# Patient Record
Sex: Male | Born: 1940 | Race: White | Hispanic: No | Marital: Married | Smoking: Former smoker
Health system: Southern US, Community
[De-identification: ages and names within clinical notes are randomized; demographics above are authoritative.]

## PROBLEM LIST (undated history)

## (undated) DIAGNOSIS — F419 Anxiety disorder, unspecified: Secondary | ICD-10-CM

## (undated) DIAGNOSIS — I10 Essential (primary) hypertension: Secondary | ICD-10-CM

## (undated) DIAGNOSIS — F1910 Other psychoactive substance abuse, uncomplicated: Secondary | ICD-10-CM

## (undated) DIAGNOSIS — F0390 Unspecified dementia without behavioral disturbance: Secondary | ICD-10-CM

## (undated) DIAGNOSIS — C01 Malignant neoplasm of base of tongue: Secondary | ICD-10-CM

## (undated) DIAGNOSIS — Z973 Presence of spectacles and contact lenses: Secondary | ICD-10-CM

## (undated) DIAGNOSIS — G459 Transient cerebral ischemic attack, unspecified: Secondary | ICD-10-CM

## (undated) DIAGNOSIS — M1990 Unspecified osteoarthritis, unspecified site: Secondary | ICD-10-CM

## (undated) DIAGNOSIS — N200 Calculus of kidney: Secondary | ICD-10-CM

## (undated) DIAGNOSIS — Z923 Personal history of irradiation: Secondary | ICD-10-CM

## (undated) DIAGNOSIS — E871 Hypo-osmolality and hyponatremia: Secondary | ICD-10-CM

## (undated) DIAGNOSIS — F909 Attention-deficit hyperactivity disorder, unspecified type: Secondary | ICD-10-CM

## (undated) DIAGNOSIS — E785 Hyperlipidemia, unspecified: Secondary | ICD-10-CM

## (undated) DIAGNOSIS — K219 Gastro-esophageal reflux disease without esophagitis: Secondary | ICD-10-CM

## (undated) DIAGNOSIS — N486 Induration penis plastica: Secondary | ICD-10-CM

## (undated) DIAGNOSIS — E7800 Pure hypercholesterolemia, unspecified: Secondary | ICD-10-CM

## (undated) DIAGNOSIS — R300 Dysuria: Principal | ICD-10-CM

## (undated) HISTORY — DX: Pure hypercholesterolemia, unspecified: E78.00

## (undated) HISTORY — DX: Transient cerebral ischemic attack, unspecified: G45.9

## (undated) HISTORY — DX: Dysuria: R30.0

## (undated) HISTORY — DX: Malignant neoplasm of base of tongue: C01

## (undated) HISTORY — DX: Gastro-esophageal reflux disease without esophagitis: K21.9

## (undated) HISTORY — DX: Calculus of kidney: N20.0

## (undated) HISTORY — PX: COLONOSCOPY: SHX174

## (undated) HISTORY — DX: Hypo-osmolality and hyponatremia: E87.1

## (undated) HISTORY — DX: Essential (primary) hypertension: I10

## (undated) HISTORY — PX: SHOULDER ARTHROSCOPY W/ ROTATOR CUFF REPAIR: SHX2400

## (undated) HISTORY — DX: Induration penis plastica: N48.6

## (undated) HISTORY — PX: TONSILLECTOMY: SUR1361

## (undated) HISTORY — DX: Anxiety disorder, unspecified: F41.9

## (undated) HISTORY — PX: HAND SURGERY: SHX662

---

## 1999-10-11 ENCOUNTER — Ambulatory Visit (HOSPITAL_COMMUNITY): Admission: RE | Admit: 1999-10-11 | Discharge: 1999-10-11 | Payer: Self-pay | Admitting: Orthopedic Surgery

## 1999-10-11 ENCOUNTER — Encounter: Payer: Self-pay | Admitting: Orthopedic Surgery

## 2000-02-22 ENCOUNTER — Other Ambulatory Visit: Admission: RE | Admit: 2000-02-22 | Discharge: 2000-02-22 | Payer: Self-pay | Admitting: Orthopedic Surgery

## 2009-01-01 HISTORY — PX: CYSTOSCOPY W/ URETEROSCOPY W/ LITHOTRIPSY: SUR380

## 2009-08-15 ENCOUNTER — Emergency Department (HOSPITAL_COMMUNITY): Admission: EM | Admit: 2009-08-15 | Discharge: 2009-08-15 | Payer: Self-pay | Admitting: Emergency Medicine

## 2009-08-24 ENCOUNTER — Ambulatory Visit (HOSPITAL_COMMUNITY): Admission: RE | Admit: 2009-08-24 | Discharge: 2009-08-24 | Payer: Self-pay | Admitting: Urology

## 2010-01-01 HISTORY — PX: DUPUYTREN CONTRACTURE RELEASE: SHX1478

## 2010-03-17 LAB — URINALYSIS, ROUTINE W REFLEX MICROSCOPIC
Bilirubin Urine: NEGATIVE
Glucose, UA: NEGATIVE mg/dL
Ketones, ur: NEGATIVE mg/dL
Leukocytes, UA: NEGATIVE
Nitrite: NEGATIVE
Protein, ur: 30 mg/dL — AB
Specific Gravity, Urine: 1.031 — ABNORMAL HIGH (ref 1.005–1.030)
Urobilinogen, UA: 0.2 mg/dL (ref 0.0–1.0)
pH: 5.5 (ref 5.0–8.0)

## 2010-03-17 LAB — URINE MICROSCOPIC-ADD ON

## 2010-03-17 LAB — POCT I-STAT, CHEM 8
BUN: 24 mg/dL — ABNORMAL HIGH (ref 6–23)
Calcium, Ion: 1.17 mmol/L (ref 1.12–1.32)
Chloride: 107 mEq/L (ref 96–112)
Creatinine, Ser: 1.1 mg/dL (ref 0.4–1.5)
Glucose, Bld: 132 mg/dL — ABNORMAL HIGH (ref 70–99)
HCT: 51 % (ref 39.0–52.0)
Hemoglobin: 17.3 g/dL — ABNORMAL HIGH (ref 13.0–17.0)
Potassium: 4.4 mEq/L (ref 3.5–5.1)
Sodium: 141 mEq/L (ref 135–145)
TCO2: 26 mmol/L (ref 0–100)

## 2010-03-17 LAB — SURGICAL PCR SCREEN: MRSA, PCR: NEGATIVE

## 2010-08-29 ENCOUNTER — Ambulatory Visit (HOSPITAL_BASED_OUTPATIENT_CLINIC_OR_DEPARTMENT_OTHER)
Admission: RE | Admit: 2010-08-29 | Discharge: 2010-08-29 | Disposition: A | Discharge status: Home / Self Care | Payer: Medicare Other | Source: Ambulatory Visit | Attending: Orthopedic Surgery | Admitting: Orthopedic Surgery

## 2010-08-29 ENCOUNTER — Other Ambulatory Visit: Payer: Self-pay | Admitting: Orthopedic Surgery

## 2010-08-29 DIAGNOSIS — F909 Attention-deficit hyperactivity disorder, unspecified type: Secondary | ICD-10-CM | POA: Insufficient documentation

## 2010-08-29 DIAGNOSIS — M720 Palmar fascial fibromatosis [Dupuytren]: Secondary | ICD-10-CM | POA: Insufficient documentation

## 2010-08-29 DIAGNOSIS — F17200 Nicotine dependence, unspecified, uncomplicated: Secondary | ICD-10-CM | POA: Insufficient documentation

## 2010-08-29 DIAGNOSIS — Z01810 Encounter for preprocedural cardiovascular examination: Secondary | ICD-10-CM | POA: Insufficient documentation

## 2010-08-29 DIAGNOSIS — Z01812 Encounter for preprocedural laboratory examination: Secondary | ICD-10-CM | POA: Insufficient documentation

## 2010-08-30 LAB — POCT HEMOGLOBIN-HEMACUE: Hemoglobin: 17.3 g/dL — ABNORMAL HIGH (ref 13.0–17.0)

## 2010-09-01 NOTE — Op Note (Signed)
NAME:  Dylan Moreno, TRULL NO.:  [id number]  MEDICAL RECORD NO.:  [id number]  LOCATION:                                 FACILITY:  PHYSICIAN:  Katy Fitch. Shavelle Runkel, M.D. 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  (Procedures performed at [facility].  OPERATION: 1. Resection of complex Dupuytren palmar fibromatosis from left palm     and small finger. 2. Resection of Dupuytren palmar fibromatosis left long finger. 3. Resection of Dupuytren palmar fibromatosis left index finger and     webspace. 4. Resection of Dupuytren palmar fibromatosis left thumb.  OPERATING SURGEON:  Katy Fitch. Molli Gethers, MD  ASSISTANT:  Marveen Reeks. [name], PA-C.  Mr. [name]'s assistance  was invaluable throughout the procedure and essential for safe retraction of neurovascular structures due to the severely altered anatomy following prior needle aponeurotomy surgery.  ANESTHESIA:  General by LMA, supplemented by a left plexus block.  SUPERVISING ANESTHESIOLOGIST:  Luan Maberry. 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  We have been acquainted for more than 20 years.  More than 10 years prior, I performed a partial palmar fasciectomy of the right hand with a durable result.  Mr. 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  He had progressive disease in multiple fingers of the right and left hands. He was very much interested in avoiding traditional fasciectomy surgery. Therefore, he researched and found Dr. Bronson Ing at [facility].  He went to see Dr. Katherina Mires, for three separate needle aponeurotomy procedures on his hands.  Unfortunately, he had rapid recurrence of his flexion contractures.  Mr. Griesinger and I play golf at the same golf course and for time-to-time we crossed paths.  Beginning 3 years ago, he began to discuss possible open surgery as he was unsatisfied with the experience of needle aponeurotomy  During each encounter, I pointed out to him that after needle aponeurotomy, there is gross disruption of the architecture of the palmar space and subcutaneous tissues.  At times, there are neurovascular injuries with needle aponeurotomy that can place the fingers in jeopardy for subsequent  open surgery.  I saw him on 2 occasions in the past 6 months to discuss potential surgery on his left hand.  On each occasion, I pointed out to him that the chance of an injury to a blood vessel or nerve with needle aponeurotomy prior procedures is dramatically increased due to  scarring and loss of normal anatomic planes, I have had prior experience with needle aponeurotomy performed by the some of the most experienced surgeons in the [place] for this predicament with identification of profound anatomic alteration.  I told Mr. Claros that we could face a 50:50 chance of causing loss of circulation to a finger with open dissection.  He was frustrated the point with his severe flexion contractures and inability to hold a golf club and do to activities of daily living, therefore he decides to proceed this time with our best effort at resection of palmar fibromatosis from his left hand.  He understands that he will be faced with extensive postoperative rehabilitation.  He will make severe keloid scars.  He will have to night splint for period of time and he will have, over time, if he lives long enough, progressive Dupuytren disease that might even require skin grafting to manage.  Questions were invited and answered in detail in the office and once again in the holding area.  DESCRIPTION OF PROCEDURE:  Kyair Ditommaso was seen in the holding area of the [facility] where the left side proper surgical site was identified and initialed.  He was subsequently interviewed by Dr. Krista Blue, who recommended a plexus block.  This was placed without complications in the holding area leading to excellent anesthesia of the left upper extremity.  Mr. Sulkowski was brought to room one of the [facility] where under Dr. Robina Ade direct supervision, general anesthesia by LMA technique was induced.  Passive compression devices were applied to his calves and a tourniquet applied to proximal brachium.  Following routine Betadine scrub and paint and administration of 2 g of Ancef as an IV prophylactic antibiotic following protocol per weight, the left arm was exsanguinated, Esmarch bandage and arterial tourniquet inflated to 220 mmHg.  We planned complex  Brunner zigzag incisions.  There was a dense wad of scar in the palm at the site of prior needle aponeurotomies particularly in the pretendinous fibers of the ring and small fingers. This was an area of great jeopardy for possible injury to the common digital vessels and nerves with open surgery.  We subsequently elevated skin flaps at the dermal fascial junction and proceeded to meticulously dissect out the common digital vessels, proper digital vessels, and pathologic fascial cords to the small finger followed by the long finger, the thumb index web space, and extensive dissection on the palmar surface of the thumb, identifying the radial ulnar proper digital nerves, radial ulnar proper digital arteries, and correcting the extreme flexion contractures.  We found multiple instances of severe scarring of the post aponeurotomy wad of fibromatosis to the neurovascular structures, most notable at the long finger PIP joint, small finger PIP joint into the region of the base of the proximal phalanx where we typically would find discrete spiral bands.  There were significant retrovascular cords remaining present causing the 95-degree flexion contractures of the long and small finger PIP joints.  These were meticulously dissected.  We were faced with very extreme dissection of the deep surface of the dermis in the small and long fingers and may have issues with skin loss due  to the profound degree of scarring.  We ultimately performed the surgery under 2 discrete tourniquets, 1 hour and 35 minutes for the palm and small finger.  A 30-minute revascularization time followed by 1 hour and 4 minutes for the thumb index webspace and long finger.  This was an extremely complex surgery, very high risk and very arduous for all involved.  There were no apparent complications noted.  The tourniquet was released after the first dissection; and while capillary refill to the small finger was slow, we  ultimately obtained normal turgor and normal apparent pulse volume.  Following dissection of the long finger and thumb, we had immediate capillary refill, normal turgor and excellent capillary refill at 2 seconds.  The wounds were inspected for bleeding points which were electrocauterized with bipolar forceps followed by repair of the skin with corner sutures of 5-0 nylon and loose interrupted sutures of 5-0 nylon.  Loosely closed with what turned out to the partial open palm technique.  The scarring in the palm to the small finger was so dense and the risk to the common digital vessels in my judgment so great, I ultimately elected to leave a significant fragment of fibromatosis intact so as not to cause injury to the common digital vessels to the ring and small fingers.  The wounds were dressed with Adaptic, Silvadene, sterile gauze, sterile Webril, and a volar plaster splint maintaining the PIP joints of the ring and small fingers at approximately 30 degrees flexion and the long finger 20 degrees of flexion.  While we were able to fully extend the fingers in the OR, I did not want to splint him in full extension out of worry that this could cause vessel spasm and possible postoperative ischemia.  Mr. Simson was transferred to recovery room in stable signs.  A postoperative conference will be held with his family.  Questions were invited and answered in detail.     Katy Fitch Shaunie Boehm, M.D.     RVS/MEDQ  D:  08/29/2010  T:  08/29/2010  Job:  [id number]  Electronically Signed by Josephine Igo M.D. on 09/01/2010 09:17:13 AM

## 2010-10-27 ENCOUNTER — Other Ambulatory Visit: Payer: Self-pay | Admitting: Dermatology

## 2011-01-17 DIAGNOSIS — E782 Mixed hyperlipidemia: Secondary | ICD-10-CM | POA: Diagnosis not present

## 2011-01-17 DIAGNOSIS — Z0000 Encounter for general adult medical examination without abnormal findings: Secondary | ICD-10-CM | POA: Diagnosis not present

## 2011-01-17 DIAGNOSIS — F909 Attention-deficit hyperactivity disorder, unspecified type: Secondary | ICD-10-CM | POA: Diagnosis not present

## 2011-01-17 DIAGNOSIS — F17200 Nicotine dependence, unspecified, uncomplicated: Secondary | ICD-10-CM | POA: Diagnosis not present

## 2011-06-25 DIAGNOSIS — M25559 Pain in unspecified hip: Secondary | ICD-10-CM | POA: Diagnosis not present

## 2011-07-18 DIAGNOSIS — Z125 Encounter for screening for malignant neoplasm of prostate: Secondary | ICD-10-CM | POA: Diagnosis not present

## 2011-07-18 DIAGNOSIS — R030 Elevated blood-pressure reading, without diagnosis of hypertension: Secondary | ICD-10-CM | POA: Diagnosis not present

## 2011-07-18 DIAGNOSIS — E782 Mixed hyperlipidemia: Secondary | ICD-10-CM | POA: Diagnosis not present

## 2011-07-18 DIAGNOSIS — F17200 Nicotine dependence, unspecified, uncomplicated: Secondary | ICD-10-CM | POA: Diagnosis not present

## 2011-07-18 DIAGNOSIS — F909 Attention-deficit hyperactivity disorder, unspecified type: Secondary | ICD-10-CM | POA: Diagnosis not present

## 2011-08-31 DIAGNOSIS — Z23 Encounter for immunization: Secondary | ICD-10-CM | POA: Diagnosis not present

## 2011-10-23 DIAGNOSIS — Z85828 Personal history of other malignant neoplasm of skin: Secondary | ICD-10-CM | POA: Diagnosis not present

## 2011-10-23 DIAGNOSIS — L723 Sebaceous cyst: Secondary | ICD-10-CM | POA: Diagnosis not present

## 2011-10-23 DIAGNOSIS — L570 Actinic keratosis: Secondary | ICD-10-CM | POA: Diagnosis not present

## 2011-10-29 DIAGNOSIS — M720 Palmar fascial fibromatosis [Dupuytren]: Secondary | ICD-10-CM | POA: Diagnosis not present

## 2011-11-06 DIAGNOSIS — M720 Palmar fascial fibromatosis [Dupuytren]: Secondary | ICD-10-CM | POA: Diagnosis not present

## 2011-11-20 ENCOUNTER — Encounter (HOSPITAL_BASED_OUTPATIENT_CLINIC_OR_DEPARTMENT_OTHER): Payer: Self-pay

## 2011-11-20 ENCOUNTER — Ambulatory Visit (HOSPITAL_BASED_OUTPATIENT_CLINIC_OR_DEPARTMENT_OTHER): Admit: 2011-11-20 | Payer: Self-pay | Admitting: Orthopedic Surgery

## 2011-11-20 ENCOUNTER — Encounter: Payer: Self-pay | Admitting: Oncology

## 2011-11-20 SURGERY — RELEASE, DUPUYTREN CONTRACTURE
Anesthesia: General | Laterality: Right

## 2011-11-20 NOTE — Progress Notes (Signed)
Faxed cancer form to [employer] @ [phone]. °

## 2012-01-21 ENCOUNTER — Other Ambulatory Visit: Payer: Self-pay | Admitting: Dermatology

## 2012-01-21 DIAGNOSIS — D485 Neoplasm of uncertain behavior of skin: Secondary | ICD-10-CM | POA: Diagnosis not present

## 2012-01-21 DIAGNOSIS — L820 Inflamed seborrheic keratosis: Secondary | ICD-10-CM | POA: Diagnosis not present

## 2012-01-21 DIAGNOSIS — D0460 Carcinoma in situ of skin of unspecified upper limb, including shoulder: Secondary | ICD-10-CM | POA: Diagnosis not present

## 2012-01-23 DIAGNOSIS — E782 Mixed hyperlipidemia: Secondary | ICD-10-CM | POA: Diagnosis not present

## 2012-01-23 DIAGNOSIS — F909 Attention-deficit hyperactivity disorder, unspecified type: Secondary | ICD-10-CM | POA: Diagnosis not present

## 2012-01-23 DIAGNOSIS — F17200 Nicotine dependence, unspecified, uncomplicated: Secondary | ICD-10-CM | POA: Diagnosis not present

## 2012-01-23 DIAGNOSIS — R030 Elevated blood-pressure reading, without diagnosis of hypertension: Secondary | ICD-10-CM | POA: Diagnosis not present

## 2012-05-14 DIAGNOSIS — H4010X0 Unspecified open-angle glaucoma, stage unspecified: Secondary | ICD-10-CM | POA: Diagnosis not present

## 2012-05-14 DIAGNOSIS — H04129 Dry eye syndrome of unspecified lacrimal gland: Secondary | ICD-10-CM | POA: Diagnosis not present

## 2012-05-14 DIAGNOSIS — H35039 Hypertensive retinopathy, unspecified eye: Secondary | ICD-10-CM | POA: Diagnosis not present

## 2012-05-14 DIAGNOSIS — H43399 Other vitreous opacities, unspecified eye: Secondary | ICD-10-CM | POA: Diagnosis not present

## 2012-07-23 DIAGNOSIS — E782 Mixed hyperlipidemia: Secondary | ICD-10-CM | POA: Diagnosis not present

## 2012-07-23 DIAGNOSIS — F909 Attention-deficit hyperactivity disorder, unspecified type: Secondary | ICD-10-CM | POA: Diagnosis not present

## 2012-07-23 DIAGNOSIS — F17200 Nicotine dependence, unspecified, uncomplicated: Secondary | ICD-10-CM | POA: Diagnosis not present

## 2012-07-23 DIAGNOSIS — Z125 Encounter for screening for malignant neoplasm of prostate: Secondary | ICD-10-CM | POA: Diagnosis not present

## 2012-08-01 ENCOUNTER — Other Ambulatory Visit: Payer: Self-pay | Admitting: Dermatology

## 2012-08-01 DIAGNOSIS — D1801 Hemangioma of skin and subcutaneous tissue: Secondary | ICD-10-CM | POA: Diagnosis not present

## 2012-08-01 DIAGNOSIS — Q828 Other specified congenital malformations of skin: Secondary | ICD-10-CM | POA: Diagnosis not present

## 2012-08-01 DIAGNOSIS — D0460 Carcinoma in situ of skin of unspecified upper limb, including shoulder: Secondary | ICD-10-CM | POA: Diagnosis not present

## 2012-08-01 DIAGNOSIS — D485 Neoplasm of uncertain behavior of skin: Secondary | ICD-10-CM | POA: Diagnosis not present

## 2012-08-01 DIAGNOSIS — L821 Other seborrheic keratosis: Secondary | ICD-10-CM | POA: Diagnosis not present

## 2012-08-01 DIAGNOSIS — L570 Actinic keratosis: Secondary | ICD-10-CM | POA: Diagnosis not present

## 2012-08-01 DIAGNOSIS — Z85828 Personal history of other malignant neoplasm of skin: Secondary | ICD-10-CM | POA: Diagnosis not present

## 2012-09-20 DIAGNOSIS — Z23 Encounter for immunization: Secondary | ICD-10-CM | POA: Diagnosis not present

## 2012-10-29 ENCOUNTER — Encounter (HOSPITAL_BASED_OUTPATIENT_CLINIC_OR_DEPARTMENT_OTHER): Payer: Self-pay | Admitting: *Deleted

## 2012-10-29 ENCOUNTER — Other Ambulatory Visit: Payer: Self-pay | Admitting: Orthopedic Surgery

## 2012-10-29 DIAGNOSIS — M720 Palmar fascial fibromatosis [Dupuytren]: Secondary | ICD-10-CM | POA: Diagnosis not present

## 2012-10-29 NOTE — Progress Notes (Signed)
He had other hand done here 2012-did well-no labs needed

## 2012-10-29 NOTE — H&P (Signed)
  Dylan Moreno is an 72 y.o. male.   Chief Complaint: c/o chronic and progressive contracture of the right hand HPI: Dylan Moreno is a well known patient who I have taken care of for 2 decades regarding issues with Dupuytren's palmar fibromatosis. He is currently followed by Dylan Moreno for primary care. He is an Dylan Moreno. 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 Dylan Moreno has Dupuytren's diathesis. He has had multiple procedures on both hands. In 2002 I resected palmar fibromatosis from his right thumb index web space, palm, ring and small fingers. He now has significant recurrent disease in his ring and small fingers crossing the PIP joints.   Past Medical History  Diagnosis Date  . Hyperlipemia   . Arthritis   . ADHD (attention deficit hyperactivity disorder)   . Wears glasses     Past Surgical History  Procedure Laterality Date  . Dupuytren contracture release  2012    left hand  . Tonsillectomy    . Shoulder arthroscopy w/ rotator cuff repair      left  . Colonoscopy    . Cystoscopy w/ ureteroscopy w/ lithotripsy  2011  . Hand surgery      right    History reviewed. No pertinent family history. Social History:  reports that he quit smoking about 3 years ago. His smoking use included Cigarettes. He smoked 0.00 packs per day. He does not have any smokeless tobacco history on file. He reports that he drinks alcohol. He reports that he does not use illicit drugs.  Allergies: No Known Allergies  No prescriptions prior to admission    No results found for this or any previous visit (from the past 48 hour(s)).  No results found.   Pertinent items are noted in HPI.  Height 6\' 2"  (1.88 m), weight 86.183 kg (190 lb).  General appearance: alert Head: Normocephalic, without obvious abnormality Neck: supple, symmetrical, trachea midline Resp: clear to auscultation bilaterally Cardio: regular rate and rhythm GI: normal findings: bowel  sounds normal Extremities:. He now has more than 90 degree flexion contracture of the PIP joints. There is extensive lateral sheath disease and retrovascular cord disease in the ring and small fingers. Pulses: 2+ and symmetric Skin: normal Neurologic: Grossly normal    Assessment/Plan Impression: Extensive recurrent Dupuytren's contracture of the right hand  Plan: To the OR for excision Dupuytren's contracture right hand involving the palm' ring and small fingers.The procedure, risks,benefits and post-op course were discussed with the patient at length and they were in agreement with the plan.  Dylan Moreno,Dylan Moreno 10/29/2012, 4:30 PM   H&P documentation: 10/30/2012  -History and Physical Reviewed  -Patient has been re-examined  -No change in the plan of care  Dylan Forster, MD

## 2012-10-30 ENCOUNTER — Encounter (HOSPITAL_BASED_OUTPATIENT_CLINIC_OR_DEPARTMENT_OTHER)
Admission: RE | Disposition: A | Discharge status: Home / Self Care | Payer: Self-pay | Source: Ambulatory Visit | Attending: Orthopedic Surgery

## 2012-10-30 ENCOUNTER — Ambulatory Visit (HOSPITAL_BASED_OUTPATIENT_CLINIC_OR_DEPARTMENT_OTHER): Payer: Medicare Other | Admitting: Anesthesiology

## 2012-10-30 ENCOUNTER — Ambulatory Visit (HOSPITAL_BASED_OUTPATIENT_CLINIC_OR_DEPARTMENT_OTHER)
Admission: RE | Admit: 2012-10-30 | Discharge: 2012-10-30 | Disposition: A | Discharge status: Home / Self Care | Payer: Medicare Other | Source: Ambulatory Visit | Attending: Orthopedic Surgery | Admitting: Orthopedic Surgery

## 2012-10-30 ENCOUNTER — Encounter (HOSPITAL_BASED_OUTPATIENT_CLINIC_OR_DEPARTMENT_OTHER): Payer: Self-pay | Admitting: Anesthesiology

## 2012-10-30 ENCOUNTER — Encounter (HOSPITAL_BASED_OUTPATIENT_CLINIC_OR_DEPARTMENT_OTHER): Payer: Medicare Other | Admitting: Anesthesiology

## 2012-10-30 DIAGNOSIS — F909 Attention-deficit hyperactivity disorder, unspecified type: Secondary | ICD-10-CM | POA: Insufficient documentation

## 2012-10-30 DIAGNOSIS — M720 Palmar fascial fibromatosis [Dupuytren]: Secondary | ICD-10-CM | POA: Diagnosis not present

## 2012-10-30 DIAGNOSIS — M129 Arthropathy, unspecified: Secondary | ICD-10-CM | POA: Insufficient documentation

## 2012-10-30 DIAGNOSIS — E785 Hyperlipidemia, unspecified: Secondary | ICD-10-CM | POA: Insufficient documentation

## 2012-10-30 DIAGNOSIS — Z87891 Personal history of nicotine dependence: Secondary | ICD-10-CM | POA: Insufficient documentation

## 2012-10-30 HISTORY — DX: Hyperlipidemia, unspecified: E78.5

## 2012-10-30 HISTORY — PX: DUPUYTREN CONTRACTURE RELEASE: SHX1478

## 2012-10-30 HISTORY — DX: Unspecified osteoarthritis, unspecified site: M19.90

## 2012-10-30 HISTORY — DX: Presence of spectacles and contact lenses: Z97.3

## 2012-10-30 HISTORY — DX: Attention-deficit hyperactivity disorder, unspecified type: F90.9

## 2012-10-30 SURGERY — RELEASE, DUPUYTREN CONTRACTURE
Anesthesia: General | Site: Hand | Laterality: Right | Wound class: Clean

## 2012-10-30 MED ORDER — FENTANYL CITRATE 0.05 MG/ML IJ SOLN: 100.0000 ug | Freq: Once | INTRAMUSCULAR | Status: DC

## 2012-10-30 MED ORDER — PROMETHAZINE HCL 25 MG/ML IJ SOLN: 6.2500 mg | INTRAMUSCULAR | Status: DC | PRN

## 2012-10-30 MED ORDER — CHLORHEXIDINE GLUCONATE 4 % EX LIQD: 60.0000 mL | Freq: Once | CUTANEOUS | Status: DC

## 2012-10-30 MED ORDER — METHYLPREDNISOLONE ACETATE 40 MG/ML IJ SUSP
INTRAMUSCULAR | Status: AC
  Filled 2012-10-30: qty 1

## 2012-10-30 MED ORDER — CEPHALEXIN 500 MG PO CAPS: 500.0000 mg | ORAL_CAPSULE | Freq: Three times a day (TID) | ORAL | Status: DC

## 2012-10-30 MED ORDER — LIDOCAINE HCL (CARDIAC) 20 MG/ML IV SOLN
INTRAVENOUS | Status: DC | PRN
  Administered 2012-10-30: 50 mg via INTRAVENOUS

## 2012-10-30 MED ORDER — OXYCODONE HCL 5 MG PO TABS: 5.0000 mg | ORAL_TABLET | Freq: Once | ORAL | Status: DC | PRN

## 2012-10-30 MED ORDER — LIDOCAINE HCL 2 % IJ SOLN
INTRAMUSCULAR | Status: AC
  Filled 2012-10-30: qty 20

## 2012-10-30 MED ORDER — SILVER SULFADIAZINE 1 % EX CREA
TOPICAL_CREAM | CUTANEOUS | Status: AC
  Filled 2012-10-30: qty 85

## 2012-10-30 MED ORDER — FENTANYL CITRATE 0.05 MG/ML IJ SOLN
50.0000 ug | INTRAMUSCULAR | Status: DC | PRN
  Administered 2012-10-30: 100 ug via INTRAVENOUS

## 2012-10-30 MED ORDER — ROPIVACAINE HCL 5 MG/ML IJ SOLN
INTRAMUSCULAR | Status: DC | PRN
  Administered 2012-10-30: 30 mL

## 2012-10-30 MED ORDER — MIDAZOLAM HCL 2 MG/2ML IJ SOLN
1.0000 mg | INTRAMUSCULAR | Status: DC | PRN
  Administered 2012-10-30: 2 mg via INTRAVENOUS

## 2012-10-30 MED ORDER — OXYCODONE HCL 5 MG/5ML PO SOLN: 5.0000 mg | Freq: Once | ORAL | Status: DC | PRN

## 2012-10-30 MED ORDER — PROPOFOL INFUSION 10 MG/ML OPTIME
INTRAVENOUS | Status: DC | PRN
  Administered 2012-10-30: 100 ug/kg/min via INTRAVENOUS

## 2012-10-30 MED ORDER — HYDROMORPHONE HCL 2 MG PO TABS: ORAL_TABLET | ORAL | Status: DC

## 2012-10-30 MED ORDER — MIDAZOLAM HCL 2 MG/2ML IJ SOLN
INTRAMUSCULAR | Status: AC
  Filled 2012-10-30: qty 2

## 2012-10-30 MED ORDER — LIDOCAINE HCL (PF) 1 % IJ SOLN
INTRAMUSCULAR | Status: AC
  Filled 2012-10-30: qty 30

## 2012-10-30 MED ORDER — CEFAZOLIN SODIUM-DEXTROSE 2-3 GM-% IV SOLR
INTRAVENOUS | Status: DC | PRN
  Administered 2012-10-30: 2 g via INTRAVENOUS

## 2012-10-30 MED ORDER — SILVER SULFADIAZINE 1 % EX CREA
TOPICAL_CREAM | CUTANEOUS | Status: DC | PRN
  Administered 2012-10-30: 1 via TOPICAL

## 2012-10-30 MED ORDER — FENTANYL CITRATE 0.05 MG/ML IJ SOLN
INTRAMUSCULAR | Status: AC
  Filled 2012-10-30: qty 2

## 2012-10-30 MED ORDER — LIDOCAINE HCL (PF) 1 % IJ SOLN
INTRAMUSCULAR | Status: AC
  Filled 2012-10-30: qty 5

## 2012-10-30 MED ORDER — MORPHINE SULFATE 2 MG/ML IJ SOLN: 1.0000 mg | INTRAMUSCULAR | Status: DC | PRN

## 2012-10-30 MED ORDER — MIDAZOLAM HCL 2 MG/2ML IJ SOLN: 1.0000 mg | INTRAMUSCULAR | Status: DC | PRN

## 2012-10-30 MED ORDER — DEXAMETHASONE SODIUM PHOSPHATE 4 MG/ML IJ SOLN
INTRAMUSCULAR | Status: DC | PRN
  Administered 2012-10-30: 10 mg via INTRAVENOUS

## 2012-10-30 MED ORDER — PROPOFOL 10 MG/ML IV BOLUS
INTRAVENOUS | Status: DC | PRN
  Administered 2012-10-30: 230 mg via INTRAVENOUS

## 2012-10-30 MED ORDER — LACTATED RINGERS IV SOLN
INTRAVENOUS | Status: DC
  Administered 2012-10-30 (×2): via INTRAVENOUS

## 2012-10-30 SURGICAL SUPPLY — 56 items
BANDAGE ADHESIVE 1X3 (GAUZE/BANDAGES/DRESSINGS) IMPLANT
BANDAGE COBAN STERILE 2 (GAUZE/BANDAGES/DRESSINGS) ×1 IMPLANT
BANDAGE CONFORM 3  STR LF (GAUZE/BANDAGES/DRESSINGS) IMPLANT
BANDAGE ELASTIC 3 VELCRO ST LF (GAUZE/BANDAGES/DRESSINGS) ×2 IMPLANT
BANDAGE GAUZE ELAST BULKY 4 IN (GAUZE/BANDAGES/DRESSINGS) ×4 IMPLANT
BLADE MINI RND TIP GREEN BEAV (BLADE) ×2 IMPLANT
BLADE SURG 15 STRL LF DISP TIS (BLADE) ×1 IMPLANT
BLADE SURG 15 STRL SS (BLADE) ×6
BNDG CMPR 9X4 STRL LF SNTH (GAUZE/BANDAGES/DRESSINGS) ×1
BNDG COHESIVE 3X5 TAN STRL LF (GAUZE/BANDAGES/DRESSINGS) ×2 IMPLANT
BNDG ESMARK 4X9 LF (GAUZE/BANDAGES/DRESSINGS) ×1 IMPLANT
BRUSH SCRUB EZ PLAIN DRY (MISCELLANEOUS) ×2 IMPLANT
CORDS BIPOLAR (ELECTRODE) ×2 IMPLANT
COVER MAYO STAND STRL (DRAPES) ×2 IMPLANT
COVER TABLE BACK 60X90 (DRAPES) ×2 IMPLANT
CUFF TOURNIQUET SINGLE 18IN (TOURNIQUET CUFF) ×1 IMPLANT
DECANTER SPIKE VIAL GLASS SM (MISCELLANEOUS) IMPLANT
DRAPE EXTREMITY T 121X128X90 (DRAPE) ×2 IMPLANT
DRAPE SURG 17X23 STRL (DRAPES) ×2 IMPLANT
DRSG EMULSION OIL 3X3 NADH (GAUZE/BANDAGES/DRESSINGS) ×4 IMPLANT
GAUZE SPONGE 4X4 12PLY STRL LF (GAUZE/BANDAGES/DRESSINGS) ×1 IMPLANT
GLOVE BIOGEL M 7.0 STRL (GLOVE) ×1 IMPLANT
GLOVE BIOGEL M STRL SZ7.5 (GLOVE) ×2 IMPLANT
GLOVE BIOGEL PI IND STRL 7.0 (GLOVE) IMPLANT
GLOVE BIOGEL PI IND STRL 7.5 (GLOVE) IMPLANT
GLOVE BIOGEL PI INDICATOR 7.0 (GLOVE) ×2
GLOVE BIOGEL PI INDICATOR 7.5 (GLOVE) ×1
GLOVE ECLIPSE 6.5 STRL STRAW (GLOVE) ×6 IMPLANT
GLOVE ORTHO TXT STRL SZ7.5 (GLOVE) ×2 IMPLANT
GOWN BRE IMP PREV XXLGXLNG (GOWN DISPOSABLE) ×4 IMPLANT
GOWN PREVENTION PLUS XLARGE (GOWN DISPOSABLE) ×6 IMPLANT
LOOP VESSEL MAXI BLUE (MISCELLANEOUS) ×2 IMPLANT
NDL HYPO 25X1 1.5 SAFETY (NEEDLE) IMPLANT
NEEDLE 27GAX1X1/2 (NEEDLE) IMPLANT
NEEDLE HYPO 25X1 1.5 SAFETY (NEEDLE) IMPLANT
NS IRRIG 1000ML POUR BTL (IV SOLUTION) ×2 IMPLANT
PACK BASIN DAY SURGERY FS (CUSTOM PROCEDURE TRAY) ×2 IMPLANT
PAD CAST 3X4 CTTN HI CHSV (CAST SUPPLIES) ×1 IMPLANT
PADDING CAST ABS 4INX4YD NS (CAST SUPPLIES) ×1
PADDING CAST ABS COTTON 4X4 ST (CAST SUPPLIES) ×1 IMPLANT
PADDING CAST COTTON 3X4 STRL (CAST SUPPLIES) ×4
SPLINT PLASTER CAST XFAST 3X15 (CAST SUPPLIES) ×4 IMPLANT
SPLINT PLASTER XTRA FASTSET 3X (CAST SUPPLIES) ×10
SPONGE GAUZE 4X4 12PLY (GAUZE/BANDAGES/DRESSINGS) ×2 IMPLANT
STOCKINETTE 4X48 STRL (DRAPES) ×2 IMPLANT
STRIP CLOSURE SKIN 1/2X4 (GAUZE/BANDAGES/DRESSINGS) IMPLANT
SUT ETHILON 5 0 P 3 18 (SUTURE) ×2
SUT NYLON ETHILON 5-0 P-3 1X18 (SUTURE) ×2 IMPLANT
SUT SILK 4 0 PS 2 (SUTURE) ×3 IMPLANT
SUT VIC AB 4-0 P2 18 (SUTURE) IMPLANT
SYR 3ML 23GX1 SAFETY (SYRINGE) IMPLANT
SYR BULB 3OZ (MISCELLANEOUS) ×1 IMPLANT
SYR CONTROL 10ML LL (SYRINGE) IMPLANT
TOWEL OR 17X24 6PK STRL BLUE (TOWEL DISPOSABLE) ×3 IMPLANT
TRAY DSU PREP LF (CUSTOM PROCEDURE TRAY) ×2 IMPLANT
UNDERPAD 30X30 INCONTINENT (UNDERPADS AND DIAPERS) ×2 IMPLANT

## 2012-10-30 NOTE — Progress Notes (Signed)
Assisted Dr. Massagee with right, ultrasound guided, supraclavicular block. Side rails up, monitors on throughout procedure. See vital signs in flow sheet. Tolerated Procedure well. 

## 2012-10-30 NOTE — Op Note (Signed)
[id number] 

## 2012-10-30 NOTE — Brief Op Note (Signed)
10/30/2012  1:23 PM  PATIENT:  Dylan Moreno  72 y.o. male  PRE-OPERATIVE DIAGNOSIS:  RIGHT DUPUYTREN'S DIATHESIS  POST-OPERATIVE DIAGNOSIS:  RIGHT DUPUYTREN'S DIATHESIS  PROCEDURE:  Procedure(s): DUPUYTREN CONTRACTURE RELEASE RIGHT PALM,RING AND SMALL FINGER (Right)  SURGEON:  Surgeon(s) and Role:    * [name]., MD - Primary  PHYSICIAN ASSISTANT:   ASSISTANTS: Mallory Shirk.A-C   ANESTHESIA:   general  EBL:  Total I/O In: 1000 [I.V.:1000] Out: -   BLOOD ADMINISTERED:none  DRAINS: none   LOCAL MEDICATIONS USED:  ROPIVACAINE PLEXUS BLOCK  SPECIMEN:  Biopsy / Limited Resection  DISPOSITION OF SPECIMEN:  PATHOLOGY  COUNTS:  YES  TOURNIQUET:   Total Tourniquet Time Documented: Upper Arm (Right) - 98 minutes Upper Arm (Right) - 12 minutes Total: Upper Arm (Right) - 110 minutes   DICTATION: .Other Dictation: Dictation Number [id number]  PLAN OF CARE: Discharge to home after PACU  PATIENT DISPOSITION:  PACU - hemodynamically stable.   Delay start of Pharmacological VTE agent (>24hrs) due to surgical blood loss or risk of bleeding: not applicable

## 2012-10-30 NOTE — Anesthesia Procedure Notes (Addendum)
Anesthesia Regional Block:  Supraclavicular block  Pre-Anesthetic Checklist: ,, timeout performed, Correct Patient, Correct Site, Correct Laterality, Correct Procedure, Correct Position, site marked, Risks and benefits discussed,  Surgical consent,  Pre-op evaluation,  At surgeon's request and post-op pain management  Laterality: Right and Upper  Prep: chloraprep       Needles:   Needle Type: Echogenic Needle      Needle Gauge: 22 and 22 G  Needle insertion depth: 5 cm   Additional Needles:  Procedures: ultrasound guided (picture in chart) Supraclavicular block Narrative:  Start time: 10/30/2012 10:30 AM End time: 10/30/2012 10:45 AM Injection made incrementally with aspirations every 5 mL.  Performed by: Personally  Anesthesiologist: T Massagee  Additional Notes: Monitors applied. Sedation. US guidance. Tolerated well   Procedure Name: LMA Insertion Date/Time: 10/30/2012 11:17 AM Performed by: Gar Gibbon Pre-anesthesia Checklist: Patient identified, Emergency Drugs available, Suction available and Patient being monitored Patient Re-evaluated:Patient Re-evaluated prior to inductionOxygen Delivery Method: Circle System Utilized Preoxygenation: Pre-oxygenation with 100% oxygen Intubation Type: IV induction Ventilation: Mask ventilation without difficulty LMA: LMA inserted LMA Size: 4.0 Number of attempts: 1 Airway Equipment and Method: bite block Placement Confirmation: positive ETCO2 Tube secured with: Tape Dental Injury: Teeth and Oropharynx as per pre-operative assessment

## 2012-10-30 NOTE — Anesthesia Preprocedure Evaluation (Addendum)
Anesthesia Evaluation  Patient identified by MRN, date of birth, ID band Patient awake    Reviewed: Allergy & Precautions, H&P , NPO status , Patient's Chart, lab work & pertinent test results  Airway Mallampati: I  Neck ROM: Full    Dental   Pulmonary neg pulmonary ROS, Current Smoker,  breath sounds clear to auscultation        Cardiovascular negative cardio ROS  Rhythm:Regular Rate:Normal     Neuro/Psych ADHD    GI/Hepatic negative GI ROS, Neg liver ROS,   Endo/Other    Renal/GU negative Renal ROS     Musculoskeletal   Abdominal   Peds  Hematology negative hematology ROS (+)   Anesthesia Other Findings   Reproductive/Obstetrics                          Anesthesia Physical Anesthesia Plan  ASA: II  Anesthesia Plan: General   Post-op Pain Management: MAC Combined w/ Regional for Post-op pain   Induction: Intravenous  Airway Management Planned: LMA  Additional Equipment:   Intra-op Plan:   Post-operative Plan: Extubation in OR  Informed Consent:   Dental advisory given  Plan Discussed with:   Anesthesia Plan Comments:        Anesthesia Quick Evaluation

## 2012-10-30 NOTE — Anesthesia Postprocedure Evaluation (Signed)
  Anesthesia Post-op Note  Patient: Dylan Moreno  Procedure(s) Performed: Procedure(s): DUPUYTREN CONTRACTURE RELEASE RIGHT PALM,RING AND SMALL FINGER (Right)  Patient Location: PACU  Anesthesia Type:General and block  Level of Consciousness: awake and alert   Airway and Oxygen Therapy: Patient Spontanous Breathing  Post-op Pain: none  Post-op Assessment: Post-op Vital signs reviewed, Patient's Cardiovascular Status Stable and Respiratory Function Stable  Post-op Vital Signs: Reviewed  Filed Vitals:   10/30/12 1415  BP: 122/74  Pulse: 72  Temp:   Resp: 15    Complications: No apparent anesthesia complications

## 2012-10-30 NOTE — Transfer of Care (Signed)
Immediate Anesthesia Transfer of Care Note  Patient: Dylan Moreno  Procedure(s) Performed: Procedure(s): DUPUYTREN CONTRACTURE RELEASE RIGHT PALM,RING AND SMALL FINGER (Right)  Patient Location: PACU  Anesthesia Type:GA combined with regional for post-op pain  Level of Consciousness: sedated and patient cooperative  Airway & Oxygen Therapy: Patient Spontanous Breathing and Patient connected to face mask oxygen  Post-op Assessment: Report given to PACU RN and Post -op Vital signs reviewed and stable  Post vital signs: Reviewed and stable  Complications: No apparent anesthesia complications

## 2012-10-31 ENCOUNTER — Encounter (HOSPITAL_BASED_OUTPATIENT_CLINIC_OR_DEPARTMENT_OTHER): Payer: Self-pay | Admitting: Orthopedic Surgery

## 2012-10-31 NOTE — Op Note (Signed)
NAME:  Dylan, Moreno NO.:  [id number]  MEDICAL RECORD NO.:  [id number]  LOCATION:                               FACILITY:  [facility]  PHYSICIAN:  Katy Fitch. Elene Downum, M.D. 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  OPERATION: 1. Resection of extraordinarily complex Dupuytren's fibromatosis from     palm and right small finger with severe scarring of neurovascular     bundles due to prior needle aponeurotomy. 2. Resection of Dupuytren's palmar fibromatosis from right ring     finger.  OPERATING SURGEON:  Katy Fitch. Effa Yarrow, M.D.  ASSISTANT:  Marveen Reeks [name], PA-C  ANESTHESIA:  General by LMA supplemented by a ropivacaine right plexus block.  SUPERVISING  ANESTHESIOLOGIST:  Burna Forts, M.D.  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  Dylan Moreno and I have been acquainted for approximately 20 years.  I routinely see him as we play golf at the same facility.  He has severe Dupuytren's diathesis with [place] ancestors and a strong family history with his brother and son being affected.  In 2002, we resected a Dupuytren's palmar fibromatosis from his right palm, small finger, and thumb with a good result.  He began to develop recurrent disease.  We had detailed informed consent on multiple occasions and after I explained needle aponeurotomy in detail, he elected to seek a consult at [facility].  He had a series of needle aponeurotomies of his right hand at [facility] and noted that it provided partial relief of his flexion contractures for only a few months.  Dylan Moreno enjoys playing golf very much and has waited to have surgery on his hand until he simply could not properly hold a golf club and could not wash his face due to the degree of flexion contractures of his ring and small fingers.  He returned for detailed informed consent during which we once again reviewed the entire spectrum of treatment options for Dupuytren's disease.  In my experience with the kind of diathesis he is experiencing, fasciotomy and fasciectomy are the only reasonable choices.  After informed consent, he was brought to the operating room at this time.  Mr. Hari and I have had a long dialogue during the past 10 years about the potential risks of future surgery, particularly after needle aponeurotomy.  I pointed out that we have no idea whether or not there was a prior injury to a blood vessel with a needle aponeurotomy, and there is often scarring of the neurovascular structures to the dermis and change in the architecture of  the typical cord structure of Dupuytren's disease.  He understood that there was a small risk of serious neurovascular injury to his finger that could be so severe as to cause loss of the finger.  After detailed informed consent in the office and in the holding area, he is brought to the operating room at this time.  DESCRIPTION OF PROCEDURE:  Sammy Cassar was brought to room 2 of the [facility] and placed in a supine position upon the operating table.  Dr. Jacklynn Bue placed a ropivacaine plexus block in the holding area for perioperative comfort and sympathectomy.  In room 2 under Dr. Marlane Mingle direct supervision, general anesthesia by LMA technique was induced.  The right hand had been marked as the proper surgical site in the holding area per protocol.  The right hand and arm were prepped with Betadine soap and solution, sterilely draped.  A pneumatic tourniquet was applied to the proximal right brachium.  Following exsanguination of right arm with Esmarch bandage, arterial tourniquet was inflated to 220 mmHg.  2 g of Ancef were administered as an IV prophylactic antibiotic due to the duration of the procedure.  Brunner zigzag incisions were fashioned remote from the prior surgical scars.  There was profound Dupuytren's disease along the ulnar aspect of the palm, the abductor digiti minimi, and across the ulnar aspect of the proximal and middle phalangeal segments of the small finger.  There was lesser disease on the radial aspect of the small finger.  There was a cord that extended from the abductor digiti minimi across the PIP joint that was more than 1 cm in diameter. We meticulously elevated skin flaps, and tried to identify the neurovascular structures proximally in the palm.  The ulnar proper digital artery was displaced to the midline by the very large cord on the ulnar aspect of the small finger, and there was extreme scarring of the ulnar proper digital  artery to the cord, which did not have the usual architecture of a virgin finger.  We meticulously dissected the cord away from the skin and then followed the nerve and vessel along the ulnar aspect of the finger to level of the DIP flexion crease.  By resecting all pathologic fascia, we were able to nearly complete a correction of the flexion contracture of the MP and PIP joints.  We then performed a limited dissection on the radial aspect of the finger and found the radial neurovascular structures adherent to the dermis, very superficial and in an atypical position.  This absolutely confirms my comments to Mr. Warning that after needle aponeurotomy, there are no rules in Dupuytren's surgery despite what the literature suggests, in my experience.  The radial cords were released with scissors followed by complete correction of the PIP flexion contracture to 0 degrees and DIP flexion contracture to 0 degrees.  We then addressed the ring finger.  A Bruner zigzag incision was fashioned, and great care was taken to identify the ulnar and radial neurovascular bundles.  The radial neurovascular  bundle was directly under the skin due to a large retrovascular cord that had displaced into the midline and very superficially.  This was released with scissors followed by meticulous dissection of lateral fascial sheet on the radial aspect of the ring finger, and relief of a lateral fascial sheet contracture on the ulnar aspect of the finger as well as a central cord distally.  We were able to fully correct the PIP flexion contracture to 0 degrees and the DIP was able to fully extend.  At this point, we released the tourniquet and noted after few moments, satisfactory capillary refill in the pulp and normal turgor.  There was some pallor of the ulnar-based skin flaps in the small finger due to extreme undermining to remove this large volume of pathologic fascia.  After approximately 8 minutes of  compression, we reinflated the tourniquet to complete closure.  The wounds were loosely closed with corner suture trauma style of 5-0 nylon and a few interrupted sutures.  Our plan will be to allow this to heal like an open palm technique proximally and in portions of the small finger incision.  There were no apparent complications other than the extreme challenge of dissecting the pathologic fascia after needle aponeurotomy.  Mr. Presley will be discharged home with prescriptions for Dilaudid 2 mg 1 or 2 tablets p.o. q.4-6 hours p.r.n. pain.  He is encouraged to use Aleve as an adjunctive analgesic and he is given Keflex 500 mg 1 p.o. q.8 hours x4 days as prophylactic antibiotic.     Katy Fitch Coralie Stanke, M.D.     RVS/MEDQ  D:  10/30/2012  T:  10/31/2012  Job:  [id number]

## 2012-11-20 ENCOUNTER — Other Ambulatory Visit: Payer: Self-pay | Admitting: Internal Medicine

## 2012-11-20 DIAGNOSIS — F17200 Nicotine dependence, unspecified, uncomplicated: Secondary | ICD-10-CM

## 2012-11-24 ENCOUNTER — Ambulatory Visit
Admission: RE | Admit: 2012-11-24 | Discharge: 2012-11-24 | Disposition: A | Discharge status: Home / Self Care | Payer: No Typology Code available for payment source | Source: Ambulatory Visit | Attending: Internal Medicine | Admitting: Internal Medicine

## 2012-11-24 DIAGNOSIS — F17200 Nicotine dependence, unspecified, uncomplicated: Secondary | ICD-10-CM

## 2012-11-25 ENCOUNTER — Other Ambulatory Visit: Payer: Self-pay | Admitting: Internal Medicine

## 2012-11-25 DIAGNOSIS — R229 Localized swelling, mass and lump, unspecified: Secondary | ICD-10-CM

## 2012-11-25 DIAGNOSIS — M720 Palmar fascial fibromatosis [Dupuytren]: Secondary | ICD-10-CM | POA: Diagnosis not present

## 2012-11-26 ENCOUNTER — Ambulatory Visit
Admission: RE | Admit: 2012-11-26 | Discharge: 2012-11-26 | Disposition: A | Discharge status: Home / Self Care | Payer: Medicare Other | Source: Ambulatory Visit | Attending: Internal Medicine | Admitting: Internal Medicine

## 2012-11-26 DIAGNOSIS — R229 Localized swelling, mass and lump, unspecified: Secondary | ICD-10-CM

## 2012-11-26 DIAGNOSIS — N2889 Other specified disorders of kidney and ureter: Secondary | ICD-10-CM | POA: Diagnosis not present

## 2012-12-03 ENCOUNTER — Other Ambulatory Visit: Payer: Self-pay | Admitting: Internal Medicine

## 2012-12-03 DIAGNOSIS — M720 Palmar fascial fibromatosis [Dupuytren]: Secondary | ICD-10-CM | POA: Diagnosis not present

## 2012-12-03 DIAGNOSIS — R9389 Abnormal findings on diagnostic imaging of other specified body structures: Secondary | ICD-10-CM

## 2012-12-03 DIAGNOSIS — R229 Localized swelling, mass and lump, unspecified: Secondary | ICD-10-CM

## 2012-12-04 ENCOUNTER — Other Ambulatory Visit: Payer: Self-pay | Admitting: Internal Medicine

## 2012-12-04 ENCOUNTER — Ambulatory Visit
Admission: RE | Admit: 2012-12-04 | Discharge: 2012-12-04 | Disposition: A | Discharge status: Home / Self Care | Payer: Medicare Other | Source: Ambulatory Visit | Attending: Internal Medicine | Admitting: Internal Medicine

## 2012-12-04 DIAGNOSIS — R229 Localized swelling, mass and lump, unspecified: Secondary | ICD-10-CM

## 2012-12-04 DIAGNOSIS — R9389 Abnormal findings on diagnostic imaging of other specified body structures: Secondary | ICD-10-CM

## 2012-12-04 DIAGNOSIS — K5730 Diverticulosis of large intestine without perforation or abscess without bleeding: Secondary | ICD-10-CM | POA: Diagnosis not present

## 2012-12-04 DIAGNOSIS — N281 Cyst of kidney, acquired: Secondary | ICD-10-CM | POA: Diagnosis not present

## 2012-12-04 MED ORDER — IOHEXOL 350 MG/ML SOLN
100.0000 mL | Freq: Once | INTRAVENOUS | Status: AC | PRN
  Administered 2012-12-04: 100 mL via INTRAVENOUS

## 2012-12-10 DIAGNOSIS — M720 Palmar fascial fibromatosis [Dupuytren]: Secondary | ICD-10-CM | POA: Diagnosis not present

## 2012-12-12 DIAGNOSIS — M720 Palmar fascial fibromatosis [Dupuytren]: Secondary | ICD-10-CM | POA: Diagnosis not present

## 2012-12-29 ENCOUNTER — Observation Stay (HOSPITAL_COMMUNITY)
Admission: EM | Admit: 2012-12-29 | Discharge: 2012-12-30 | Disposition: A | Discharge status: Home / Self Care | Payer: Medicare Other | Attending: Internal Medicine | Admitting: Internal Medicine

## 2012-12-29 ENCOUNTER — Encounter (HOSPITAL_COMMUNITY): Payer: Self-pay | Admitting: Emergency Medicine

## 2012-12-29 ENCOUNTER — Emergency Department (HOSPITAL_COMMUNITY): Payer: Medicare Other

## 2012-12-29 ENCOUNTER — Emergency Department (INDEPENDENT_AMBULATORY_CARE_PROVIDER_SITE_OTHER)
Admission: EM | Admit: 2012-12-29 | Discharge: 2012-12-29 | Disposition: A | Discharge status: Nursing Home (Includes ICF) | Payer: Medicare Other | Source: Home / Self Care

## 2012-12-29 DIAGNOSIS — F17200 Nicotine dependence, unspecified, uncomplicated: Secondary | ICD-10-CM | POA: Insufficient documentation

## 2012-12-29 DIAGNOSIS — I079 Rheumatic tricuspid valve disease, unspecified: Secondary | ICD-10-CM | POA: Insufficient documentation

## 2012-12-29 DIAGNOSIS — I059 Rheumatic mitral valve disease, unspecified: Secondary | ICD-10-CM | POA: Insufficient documentation

## 2012-12-29 DIAGNOSIS — E785 Hyperlipidemia, unspecified: Secondary | ICD-10-CM | POA: Diagnosis not present

## 2012-12-29 DIAGNOSIS — G459 Transient cerebral ischemic attack, unspecified: Secondary | ICD-10-CM

## 2012-12-29 DIAGNOSIS — R42 Dizziness and giddiness: Secondary | ICD-10-CM | POA: Diagnosis not present

## 2012-12-29 DIAGNOSIS — G319 Degenerative disease of nervous system, unspecified: Secondary | ICD-10-CM | POA: Diagnosis not present

## 2012-12-29 DIAGNOSIS — R29810 Facial weakness: Principal | ICD-10-CM | POA: Insufficient documentation

## 2012-12-29 DIAGNOSIS — Z8673 Personal history of transient ischemic attack (TIA), and cerebral infarction without residual deficits: Secondary | ICD-10-CM | POA: Diagnosis present

## 2012-12-29 DIAGNOSIS — F909 Attention-deficit hyperactivity disorder, unspecified type: Secondary | ICD-10-CM | POA: Diagnosis present

## 2012-12-29 DIAGNOSIS — I6529 Occlusion and stenosis of unspecified carotid artery: Secondary | ICD-10-CM | POA: Insufficient documentation

## 2012-12-29 DIAGNOSIS — I6789 Other cerebrovascular disease: Secondary | ICD-10-CM | POA: Insufficient documentation

## 2012-12-29 HISTORY — DX: Transient cerebral ischemic attack, unspecified: G45.9

## 2012-12-29 LAB — RAPID URINE DRUG SCREEN, HOSP PERFORMED
Benzodiazepines: NOT DETECTED
Cocaine: NOT DETECTED
Opiates: NOT DETECTED
Tetrahydrocannabinol: NOT DETECTED

## 2012-12-29 LAB — TROPONIN I: Troponin I: <0.3 ng/mL (ref <0.30)

## 2012-12-29 LAB — POCT I-STAT TROPONIN I: Troponin i, poc: 0 ng/mL (ref 0.00–0.08)

## 2012-12-29 LAB — DIFFERENTIAL
Basophils Absolute: 0 10*3/uL (ref 0.0–0.1)
Eosinophils Absolute: 0.2 10*3/uL (ref 0.0–0.7)
Eosinophils Relative: 3 % (ref 0–5)
Lymphocytes Relative: 26 % (ref 12–46)
Monocytes Absolute: 0.6 10*3/uL (ref 0.1–1.0)
Monocytes Relative: 8 % (ref 3–12)

## 2012-12-29 LAB — POCT I-STAT, CHEM 8
Calcium, Ion: 1.28 mmol/L (ref 1.13–1.30)
Chloride: 106 mEq/L (ref 96–112)
Creatinine, Ser: 1 mg/dL (ref 0.50–1.35)
Glucose, Bld: 87 mg/dL (ref 70–99)
Hemoglobin: 16.3 g/dL (ref 13.0–17.0)
Potassium: 4.2 mEq/L (ref 3.5–5.1)
TCO2: 24 mmol/L (ref 0–100)

## 2012-12-29 LAB — ETHANOL: Alcohol, Ethyl (B): <11 mg/dL (ref 0–11)

## 2012-12-29 LAB — CBC
Hemoglobin: 16.8 g/dL (ref 13.0–17.0)
MCHC: 35.3 g/dL (ref 30.0–36.0)
RDW: 13.5 % (ref 11.5–15.5)
WBC: 7 10*3/uL (ref 4.0–10.5)

## 2012-12-29 LAB — COMPREHENSIVE METABOLIC PANEL
ALT: 32 U/L (ref 0–53)
AST: 26 U/L (ref 0–37)
BUN: 23 mg/dL (ref 6–23)
CO2: 23 mEq/L (ref 19–32)
Calcium: 9.5 mg/dL (ref 8.4–10.5)
Creatinine, Ser: 0.88 mg/dL (ref 0.50–1.35)
GFR calc Af Amer: >90 mL/min (ref >90)
GFR calc non Af Amer: 84 mL/min — ABNORMAL LOW (ref >90)
Sodium: 143 mEq/L (ref 135–145)
Total Protein: 7.2 g/dL (ref 6.0–8.3)

## 2012-12-29 LAB — URINALYSIS, ROUTINE W REFLEX MICROSCOPIC
Bilirubin Urine: NEGATIVE
Hgb urine dipstick: NEGATIVE
Ketones, ur: NEGATIVE mg/dL
Specific Gravity, Urine: 1.023 (ref 1.005–1.030)
Urobilinogen, UA: 1 mg/dL (ref 0.0–1.0)
pH: 5.5 (ref 5.0–8.0)

## 2012-12-29 LAB — GLUCOSE, CAPILLARY: Glucose-Capillary: 93 mg/dL (ref 70–99)

## 2012-12-29 LAB — PROTIME-INR: INR: 0.96 (ref 0.00–1.49)

## 2012-12-29 LAB — APTT: aPTT: 33 seconds (ref 24–37)

## 2012-12-29 MED ORDER — ASPIRIN 81 MG PO CHEW
324.0000 mg | CHEWABLE_TABLET | Freq: Once | ORAL | Status: AC
  Administered 2012-12-29: 324 mg via ORAL
  Filled 2012-12-29: qty 4

## 2012-12-29 MED ORDER — ENOXAPARIN SODIUM 40 MG/0.4ML ~~LOC~~ SOLN
40.0000 mg | SUBCUTANEOUS | Status: DC
  Administered 2012-12-30: 40 mg via SUBCUTANEOUS
  Filled 2012-12-29: qty 0.4

## 2012-12-29 MED ORDER — ATORVASTATIN CALCIUM 10 MG PO TABS
10.0000 mg | ORAL_TABLET | Freq: Every day | ORAL | Status: DC
  Administered 2012-12-30: 10 mg via ORAL
  Filled 2012-12-29: qty 1

## 2012-12-29 MED ORDER — AMPHETAMINE-DEXTROAMPHETAMINE 10 MG PO TABS
20.0000 mg | ORAL_TABLET | Freq: Two times a day (BID) | ORAL | Status: DC
  Administered 2012-12-30: 20 mg via ORAL
  Filled 2012-12-29: qty 2

## 2012-12-29 MED ORDER — ASPIRIN EC 325 MG PO TBEC
325.0000 mg | DELAYED_RELEASE_TABLET | Freq: Every day | ORAL | Status: DC
  Administered 2012-12-30: 325 mg via ORAL
  Filled 2012-12-29: qty 1

## 2012-12-29 NOTE — H&P (Signed)
[facility] History and Physical  Patient: Dylan Moreno  ZOX:[id number]  DOB: 01-Aug-1940  DOS: the patient was seen and examined on 12/29/2012 PCP: Katy Apo, MD  Chief Complaint: Drooping of the mouth on the left side  HPI: Dylan Moreno is a 72 y.o. male with Past medical history of hyperlipidemia and ADHD. The patient is coming from home. The patient presented with a complaint of left-sided dropping off the angle of the mouth that was noted earlier in the morning. The history is noted from the patient who mentions that everything was fine until yesterday. This morning when he woke up he had an episode of dizziness and lightheadedness without any vertigo which lasted for an hour somewhat between 9-11. When his son came to home he found that he was having the drop of the angle of the mouth. There was no other neurological complaint noted and they brought the patient to the urgent care at which time his symptoms completely resolved and the patient was brought here for further workup. At present Pt denies any fever, chills, headache, cough, chest pain, palpitation, shortness of breath, orthopnea, PND, nausea, vomiting, abdominal pain, diarrhea, constipation, active bleeding, burning urination, dizziness, pedal edema,  focal neurological deficit.   Review of Systems: as mentioned in the history of present illness.  A Comprehensive review of the other systems is negative.  Past Medical History  Diagnosis Date  . Hyperlipemia   . Arthritis   . ADHD (attention deficit hyperactivity disorder)   . Wears glasses    Past Surgical History  Procedure Laterality Date  . Dupuytren contracture release  2012    left hand  . Tonsillectomy    . Shoulder arthroscopy w/ rotator cuff repair      left  . Colonoscopy    . Cystoscopy w/ ureteroscopy w/ lithotripsy  2011  . Hand surgery      right  . Dupuytren contracture release Right 10/30/2012    Procedure: DUPUYTREN CONTRACTURE  RELEASE RIGHT PALM,RING AND SMALL FINGER;  Surgeon: [name]., MD;  Location: [facility];  Service: Orthopedics;  Laterality: Right;   Social History:  reports that he quit smoking about 3 years ago. His smoking use included Cigarettes and Cigars. He smoked 0.00 packs per day. He does not have any smokeless tobacco history on file. He reports that he drinks alcohol. He reports that he does not use illicit drugs. Independent for most of his  ADL.  No Known Allergies  No family history on file.  Prior to Admission medications   Medication Sig Start Date End Date Taking? Authorizing Provider  amphetamine-dextroamphetamine (ADDERALL) 20 MG tablet Take 20 mg by mouth 2 (two) times daily.   Yes Historical Provider, MD  aspirin EC 81 MG tablet Take 81 mg by mouth daily.   Yes Historical Provider, MD  atorvastatin (LIPITOR) 10 MG tablet Take 10 mg by mouth daily.   Yes Historical Provider, MD  Multiple Vitamin (MULTIVITAMIN WITH MINERALS) TABS tablet Take 1 tablet by mouth daily.   Yes Historical Provider, MD    Physical Exam: Filed Vitals:   12/29/12 1737 12/29/12 1838 12/29/12 1924 12/29/12 1930  BP: 148/87 127/79  137/80  Pulse: 69 67  64  Temp:   97.6 F (36.4 C)   TempSrc:      Resp: 17 15  18   Weight:      SpO2: 97% 98%  96%    General: Alert, Awake and Oriented to Time,  Place and Person. Appear in no distress Eyes: PERRL ENT: Oral Mucosa clear moist. Neck: no JVD Cardiovascular: S1 and S2 Present, no Murmur, Peripheral Pulses Present Respiratory: Bilateral Air entry equal and Decreased, Clear to Auscultation,  no Crackles,no wheezes Abdomen: Bowel Sound Present, Soft and Non tender Skin: no Rash Extremities: no Pedal edema, no calf tenderness Neurologic: Mental status alert and oriented follows command appropriate speech, Cranial Nerves pupils are reactive, cough present gag present, Motor strength bilaterally equal strength, Sensation present light  touch, reflexes present, babinski negative, Proprioception normal, Cerebellar test normal finger-nose-finger.  Labs on Admission:  CBC:  Recent Labs Lab 12/29/12 1843 12/29/12 1859  WBC 7.0  --   NEUTROABS 4.4  --   HGB 16.8 16.3  HCT 47.6 48.0  MCV 90.3  --   PLT 187  --     CMP     Component Value Date/Time   NA 142 12/29/2012 1859   K 4.2 12/29/2012 1859   CL 106 12/29/2012 1859   CO2 23 12/29/2012 1843   GLUCOSE 87 12/29/2012 1859   BUN 25* 12/29/2012 1859   CREATININE 1.00 12/29/2012 1859   CALCIUM 9.5 12/29/2012 1843   PROT 7.2 12/29/2012 1843   ALBUMIN 4.0 12/29/2012 1843   AST 26 12/29/2012 1843   ALT 32 12/29/2012 1843   ALKPHOS 85 12/29/2012 1843   BILITOT 1.0 12/29/2012 1843   GFRNONAA 84* 12/29/2012 1843   GFRAA >90 12/29/2012 1843    No results found for this basename: LIPASE, AMYLASE,  in the last 168 hours No results found for this basename: AMMONIA,  in the last 168 hours   Recent Labs Lab 12/29/12 1843  TROPONINI <0.30   BNP (last 3 results) No results found for this basename: PROBNP,  in the last 8760 hours  Radiological Exams on Admission: Ct Head Wo Contrast  12/29/2012   CLINICAL DATA:  Facial droop  EXAM: CT HEAD WITHOUT CONTRAST  TECHNIQUE: Contiguous axial images were obtained from the base of the skull through the vertex without intravenous contrast.  COMPARISON:  None available  FINDINGS: No acute intracranial hemorrhage or large vessel territory infarct identified. 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 The ventricles are diffusely enlarged out of proportion with the cortical sulci, which can be seen with NPH. No mass lesion or midline shift. No extra-axial fluid collection.  Calvarium is intact.  Orbits are normal.  Paranasal sinuses and mastoid air cells are clear.  IMPRESSION: 1. No acute intracranial process. 2.  Ventricular prominence out of proportion to the cortical sulci, which can be seen with NPH. Clinical correlation is recommended.   Electronically Signed   By: Rise Mu M.D.   On: 12/29/2012 19:08    EKG: Independently reviewed. normal sinus rhythm.  Assessment/Plan Principal Problem:   TIA (transient ischemic attack) Active Problems:   Hyperlipemia   ADHD (attention deficit hyperactivity disorder)   1. TIA (transient ischemic attack) The patient is presenting with complaints of dizziness, ataxia and facial asymmetry, or symptoms currently resolved. Initial CT scan is negative for any acute stroke. He will be admitted for telemetry monitoring. Serial neuro checks. We will get an echocardiogram and carotid Doppler. We'll also get an MRI of the brain. Patient was given one dose of aspirin 325.  2. Dyslipidemia Continue Lipitor check lipid profile  3. ADHD Continue Adderal  DVT Prophylaxis: subcutaneous Heparin Nutrition: Advance as tolerated cardiac  Code Status: Full  Disposition: Admitted to observation in telemetry unit.  Author: Lynden Oxford, MD [facility] Pager: [phone] 12/29/2012, 8:45 PM    If 7PM-7AM, please contact night-coverage [email or web address] Password [email or web address]

## 2012-12-29 NOTE — ED Notes (Signed)
Patient unable to urinate right now 

## 2012-12-29 NOTE — ED Notes (Signed)
Son stated, I came over to my dad's house and I noticed when my dad smiled it droop to the right. I didn't noticed anything else. Pt. Stated, I was smoking a cuban cigar and I got a little thick headed and made me feel weak.  Pt. Alert and oriented.  No arm drift.  Son stated, his smile doesn't look symetrical , his left bottom lip is drooping.

## 2012-12-29 NOTE — ED Notes (Signed)
No answer from pt in lobby x 1. 

## 2012-12-29 NOTE — ED Provider Notes (Signed)
CSN: [id number]     Arrival date & time 12/29/12  1435 History   First MD Initiated Contact with Patient 12/29/12 1818     Chief Complaint  Patient presents with  . Weakness   (Consider location/radiation/quality/duration/timing/severity/associated sxs/prior Treatment) HPI patient noted to have left mouth droop as observed by his son when he saw him today at 11 AM. Patient also noted to have unsteady gait by his son. Patient complained of "dizziness meaning lightheadedness. He is presently asymptomatic and looks normal. His son. No treatment prior to coming here patient was evaluated by Dr.Keller at urgent care center sent here for further evaluation. No treatment prior to coming here symptoms resolve spontaneously Patient Past Medical History  Diagnosis Date  . Hyperlipemia   . Arthritis   . ADHD (attention deficit hyperactivity disorder)   . Wears glasses    Past Surgical History  Procedure Laterality Date  . Dupuytren contracture release  2012    left hand  . Tonsillectomy    . Shoulder arthroscopy w/ rotator cuff repair      left  . Colonoscopy    . Cystoscopy w/ ureteroscopy w/ lithotripsy  2011  . Hand surgery      right  . Dupuytren contracture release Right 10/30/2012    Procedure: DUPUYTREN CONTRACTURE RELEASE RIGHT PALM,RING AND SMALL FINGER;  Surgeon: [name]., MD;  Location: [facility];  Service: Orthopedics;  Laterality: Right;   No family history on file. History  Substance Use Topics  . Smoking status: Former Smoker    Types: Cigarettes, Cigars    Quit date: 10/29/2009  . Smokeless tobacco: Not on file  . Alcohol Use: Yes     Comment: occ    Review of Systems  Neurological: Positive for weakness.  All other systems reviewed and are negative.    Allergies  Review of patient's allergies indicates no known allergies.  Home Medications   Current Outpatient Rx  Name  Route  Sig  Dispense  Refill  .  amphetamine-dextroamphetamine (ADDERALL) 20 MG tablet   Oral   Take 20 mg by mouth 2 (two) times daily.         Marland Kitchen aspirin EC 81 MG tablet   Oral   Take 81 mg by mouth daily.         Marland Kitchen atorvastatin (LIPITOR) 10 MG tablet   Oral   Take 10 mg by mouth daily.         . Multiple Vitamin (MULTIVITAMIN WITH MINERALS) TABS tablet   Oral   Take 1 tablet by mouth daily.          BP 148/87  Pulse 69  Temp(Src) 97.5 F (36.4 C) (Oral)  Resp 17  Wt 196 lb 6.4 oz (89.086 kg)  SpO2 97% Physical Exam  Nursing note and vitals reviewed. Constitutional: He is oriented to person, place, and time. He appears well-developed and well-nourished.  HENT:  Head: Normocephalic and atraumatic.  Eyes: Conjunctivae are normal. Pupils are equal, round, and reactive to light.  Neck: Neck supple. No tracheal deviation present. No thyromegaly present.  Cardiovascular: Normal rate and regular rhythm.   No murmur heard. Pulmonary/Chest: Effort normal and breath sounds normal.  Abdominal: Soft. Bowel sounds are normal. He exhibits no distension. There is no tenderness.  Musculoskeletal: Normal range of motion. He exhibits no edema and no tenderness.  Neurological: He is alert and oriented to person, place, and time. He has normal reflexes. Coordination normal.  DTRs  symmetric bilaterally knee jerk ankle jerk biceps toes are bilaterally gait normal Romberg normal pronator drift normal finger-nose normal  Skin: Skin is warm and dry. No rash noted.  Psychiatric: He has a normal mood and affect.    ED Course  Procedures (including critical care time) Labs Review Labs Reviewed - No data to display Imaging Review No results found.  EKG Interpretation    Date/Time:  Monday December 29 2012 14:54:17 EST Ventricular Rate:  77 PR Interval:  154 QRS Duration: 82 QT Interval:  392 QTC Calculation: 443 R Axis:   25 Text Interpretation:  Normal sinus rhythm Normal ECG No significant change since last  tracing Confirmed by Ethelda Chick  MD, Wojciech Willetts ([id number]) on 12/29/2012 7:49:29 PM           Results for orders placed during the hospital encounter of 12/29/12  ETHANOL      Result Value Range   Alcohol, Ethyl (B) <11  0 - 11 mg/dL  PROTIME-INR      Result Value Range   Prothrombin Time 12.6  11.6 - 15.2 seconds   INR 0.96  0.00 - 1.49  APTT      Result Value Range   aPTT 33  24 - 37 seconds  CBC      Result Value Range   WBC 7.0  4.0 - 10.5 K/uL   RBC 5.27  4.22 - 5.81 MIL/uL   Hemoglobin 16.8  13.0 - 17.0 g/dL   HCT 16.1  09.6 - 04.5 %   MCV 90.3  78.0 - 100.0 fL   MCH 31.9  26.0 - 34.0 pg   MCHC 35.3  30.0 - 36.0 g/dL   RDW 40.9  81.1 - 91.4 %   Platelets 187  150 - 400 K/uL  DIFFERENTIAL      Result Value Range   Neutrophils Relative % 63  43 - 77 %   Neutro Abs 4.4  1.7 - 7.7 K/uL   Lymphocytes Relative 26  12 - 46 %   Lymphs Abs 1.8  0.7 - 4.0 K/uL   Monocytes Relative 8  3 - 12 %   Monocytes Absolute 0.6  0.1 - 1.0 K/uL   Eosinophils Relative 3  0 - 5 %   Eosinophils Absolute 0.2  0.0 - 0.7 K/uL   Basophils Relative 0  0 - 1 %   Basophils Absolute 0.0  0.0 - 0.1 K/uL  COMPREHENSIVE METABOLIC PANEL      Result Value Range   Sodium 143  135 - 145 mEq/L   Potassium 4.2  3.5 - 5.1 mEq/L   Chloride 108  96 - 112 mEq/L   CO2 23  19 - 32 mEq/L   Glucose, Bld 87  70 - 99 mg/dL   BUN 23  6 - 23 mg/dL   Creatinine, Ser 7.82  0.50 - 1.35 mg/dL   Calcium 9.5  8.4 - 95.6 mg/dL   Total Protein 7.2  6.0 - 8.3 g/dL   Albumin 4.0  3.5 - 5.2 g/dL   AST 26  0 - 37 U/L   ALT 32  0 - 53 U/L   Alkaline Phosphatase 85  39 - 117 U/L   Total Bilirubin 1.0  0.3 - 1.2 mg/dL   GFR calc non Af Amer 84 (*) >90 mL/min   GFR calc Af Amer >90  >90 mL/min  TROPONIN I      Result Value Range   Troponin I <0.30  <0.30 ng/mL  GLUCOSE, CAPILLARY      Result Value Range   Glucose-Capillary 93  70 - 99 mg/dL  POCT I-STAT, CHEM 8      Result Value Range   Sodium 142  135 - 145 mEq/L    Potassium 4.2  3.5 - 5.1 mEq/L   Chloride 106  96 - 112 mEq/L   BUN 25 (*) 6 - 23 mg/dL   Creatinine, Ser 4.09  0.50 - 1.35 mg/dL   Glucose, Bld 87  70 - 99 mg/dL   Calcium, Ion 8.11  9.14 - 1.30 mmol/L   TCO2 24  0 - 100 mmol/L   Hemoglobin 16.3  13.0 - 17.0 g/dL   HCT 78.2  95.6 - 21.3 %  POCT I-STAT TROPONIN I      Result Value Range   Troponin i, poc 0.00  0.00 - 0.08 ng/mL   Comment 3            Ct Abdomen Pelvis W Wo Contrast  12/04/2012   CLINICAL DATA:  CT screening raises question of left renal mass. No known trauma or history of cancer.  EXAM: CT ABDOMEN AND PELVIS WITHOUT AND WITH CONTRAST  TECHNIQUE: Multidetector CT imaging of the abdomen and pelvis was performed without contrast material in one or both body regions, followed by contrast material(s) and further sections in one or both body regions.  CONTRAST:  OMNIPAQUE IOHEXOL 350 MG/ML SOLN  COMPARISON:  CT of the chest on 11/24/2012, ultrasound of the kidneys on 11/26/2012  FINDINGS: Images are performed of the upper abdomen prior to and following the administration of intravenous contrast. Within the lower pole region of the right kidney, there are 2 simple cysts, measuring 2.3 and 0.8 cm. Within the left kidney, no discrete mass is identified. The abnormalities questioned on previous CT and ultrasound exams are felt to represent normal anatomic lobation kidney. There is no hydronephrosis. No intrarenal calculi or ureteral stones are identified.  No focal abnormality identified within the liver, spleen, pancreas, adrenal glands. Gallbladder is present.  The stomach and small bowel loops are normal in appearance. There are numerous colonic diverticula. No evidence for acute diverticulitis. The appendix is well seen and has a normal appearance. No retroperitoneal or mesenteric adenopathy. No evidence for aortic aneurysm.  Bone windows are unremarkable.  IMPRESSION: 1. The kidneys are well evaluated. There is no evidence for left  renal mass. 2. Right lower pole renal cyst. 3. Diverticulosis without evidence for acute diverticulitis.   Electronically Signed   By: Rosalie Gums M.D.   On: 12/04/2012 15:01   Ct Head Wo Contrast  12/29/2012   CLINICAL DATA:  Facial droop  EXAM: CT HEAD WITHOUT CONTRAST  TECHNIQUE: Contiguous axial images were obtained from the base of the skull through the vertex without intravenous contrast.  COMPARISON:  None available  FINDINGS: No acute intracranial hemorrhage or large vessel territory infarct identified. 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 The ventricles are diffusely enlarged out of proportion with the cortical sulci, which can be seen with NPH. No mass lesion or midline shift. No extra-axial fluid collection.  Calvarium is intact.  Orbits are normal.  Paranasal sinuses and mastoid air cells are clear.  IMPRESSION: 1. No acute intracranial process. 2. Ventricular prominence out of proportion to the cortical sulci, which can be seen with NPH. Clinical correlation is recommended.   Electronically Signed   By:  Rise Mu M.D.   On: 12/29/2012 19:08     MDM  No diagnosis found. Spoke with Dr.P.Patel and 23 hour observation telemetry, aspirin, Diagnosis transient ischemic attack    Doug Sou, MD 12/29/12 2025

## 2012-12-29 NOTE — ED Provider Notes (Signed)
Chief Complaint:  No chief complaint on file.   History of Present Illness:   Dylan Moreno is a pleasant 72 year old male with a history of high cholesterol and a cigarette smoker who around 11:30 AM today was noted by his son,, who is a IT sales professional to have drooping of the left side of the mouth. This did not interfere with speech. He denies any associated symptoms of headaches, diplopia, blurry vision, paresthesias, weakness of his extremities, or stiff neck. He did feel dizzy and off-balance. The episode lasted about an hour and all and now the weakness of the face has gone away completely. He has no residual neurological symptoms except for feeling still a little dizzy and off balance. He is able to ambulate without any apparent difficulty. He has never had an episode like this before. He denies any history of stroke or heart attack. He denies any associated shortness of breath or chest pain.  Review of Systems:  Other than noted above, the patient denies any of the following symptoms: Systemic:  No fever, chills, fatigue, photophobia, stiff neck. Eye:  No redness, eye pain, discharge, blurred vision, or diplopia. ENT:  No nasal congestion, rhinorrhea, sinus pressure or pain, sneezing, earache, or sore throat.  No jaw claudication. Neuro:  No paresthesias, loss of consciousness, seizure activity, muscle weakness, trouble with coordination or gait, trouble speaking or swallowing. Psych:  No depression, anxiety or trouble sleeping.  PMFSH:  Past medical history, family history, social history, meds, and allergies were reviewed. He has a history of hypercholesterolemia and is on Lipitor. He also takes aspirin. He is a cigarette smoker. He takes Adderall for ADD. He was recently found to have a "spot on the kidney." He also has Dupuytren's contractures.  Physical Exam:   Vital signs:  BP 152/86  Pulse 78  Temp(Src) 97.7 F (36.5 C) (Oral)  Resp 16  SpO2 97% General:  Alert and oriented.  In no  distress. Eye:  Lids and conjunctivas normal.  PERRL,  Full EOMs.  Fundi benign with normal discs and vessels. ENT:  No cranial or facial tenderness to palpation.  TMs and canals clear.  Nasal mucosa was normal and uncongested without any drainage. No intra oral lesions, pharynx clear, mucous membranes moist, dentition normal. Neck:  Supple, full ROM, no tenderness to palpation.  No adenopathy or mass. No carotid bruit. Lungs: Clear to auscultation. Heart: Regular rhythm, no gallop or murmur. Neuro:  Alert and orented times 3.  Speech was clear, fluent, and appropriate.  Cranial nerves intact. No pronator drift, muscle strength normal. Finger to nose normal.  DTRs were 2+ and symmetrical.Station and gait were normal.  Romberg's sign was normal.  Able to perform tandem gait well. The patient reports feeling somewhat unsteady in doing Romberg's maneuver and tandem gait, although he could perform each of these well and without any obvious ataxia. Psych:  Normal affect.  Assessment:  The encounter diagnosis was TIA (transient ischemic attack).  No evidence of a stroke her ongoing neurological deficit.  Plan:  The patient was transferred to the ED via shuttle in stable condition.  Medical Decision Making:  72 year old male with history of hyperlipidemia and cigarette smoker experienced onset of drooping of left side of mouth and ataxia today at 11:30.  Symptoms lasted 1 hour then resolved completely.  Right now he states he feels unsteady on feet, but neuro exam is normal, Romberg's sign negative, and able to perform tandem gait well.  Never had anything  like this before.  No history of stroke.  No chest pain or shortness of breath.  My diagnosis is TIA.      Reuben Likes, MD 12/29/12 [phone]

## 2012-12-29 NOTE — ED Notes (Signed)
Patient here with son. Son reports seeing father at 62 am today.  Son noted patient had sagging smile on the left.  , patient has complained of dizziness and feeling " off balance" currently alert and oriented, mae x 4 denies pain or dizziness.  Stable gait.  Answering appropriately.

## 2012-12-29 NOTE — ED Notes (Signed)
Patient with son-going to ed.  Dr Lorenz Coaster agreed to son transporting patient to ed.

## 2012-12-29 NOTE — ED Notes (Signed)
Report given to [place], Charity fundraiser... tx by shuttle.

## 2012-12-30 ENCOUNTER — Observation Stay (HOSPITAL_COMMUNITY): Payer: Medicare Other

## 2012-12-30 DIAGNOSIS — F909 Attention-deficit hyperactivity disorder, unspecified type: Secondary | ICD-10-CM | POA: Diagnosis not present

## 2012-12-30 DIAGNOSIS — G459 Transient cerebral ischemic attack, unspecified: Secondary | ICD-10-CM

## 2012-12-30 DIAGNOSIS — E782 Mixed hyperlipidemia: Secondary | ICD-10-CM | POA: Diagnosis not present

## 2012-12-30 LAB — LIPID PANEL
Cholesterol: 190 mg/dL (ref 0–200)
Total CHOL/HDL Ratio: 5.9 RATIO
Triglycerides: 281 mg/dL — ABNORMAL HIGH (ref <150)
VLDL: 56 mg/dL — ABNORMAL HIGH (ref 0–40)

## 2012-12-30 LAB — COMPREHENSIVE METABOLIC PANEL
ALT: 31 U/L (ref 0–53)
CO2: 22 mEq/L (ref 19–32)
Calcium: 9.1 mg/dL (ref 8.4–10.5)
Creatinine, Ser: 0.82 mg/dL (ref 0.50–1.35)
GFR calc Af Amer: >90 mL/min (ref >90)
GFR calc non Af Amer: 87 mL/min — ABNORMAL LOW (ref >90)
Glucose, Bld: 99 mg/dL (ref 70–99)
Potassium: 4.5 mEq/L (ref 3.7–5.3)
Total Bilirubin: 0.7 mg/dL (ref 0.3–1.2)

## 2012-12-30 LAB — CBC WITH DIFFERENTIAL/PLATELET
Basophils Absolute: 0 10*3/uL (ref 0.0–0.1)
Basophils Relative: 0 % (ref 0–1)
Eosinophils Absolute: 0.2 10*3/uL (ref 0.0–0.7)
Lymphocytes Relative: 22 % (ref 12–46)
MCH: 31.5 pg (ref 26.0–34.0)
MCHC: 35 g/dL (ref 30.0–36.0)
Monocytes Absolute: 0.4 10*3/uL (ref 0.1–1.0)
Neutrophils Relative %: 67 % (ref 43–77)
Platelets: 185 10*3/uL (ref 150–400)

## 2012-12-30 LAB — PROTIME-INR: INR: 0.95 (ref 0.00–1.49)

## 2012-12-30 MED ORDER — ASPIRIN 325 MG PO TBEC: 325.0000 mg | DELAYED_RELEASE_TABLET | Freq: Every day | ORAL | Status: DC

## 2012-12-30 NOTE — Progress Notes (Signed)
*  PRELIMINARY RESULTS* Vascular Ultrasound Carotid Duplex (Doppler) has been completed.   Findings suggest 1-39% internal carotid artery stenosis bilaterally. Vertebral arteries are patent with antegrade flow.  12/30/2012 11:02 AM Gertie Fey, RVT, RDCS, RDMS

## 2012-12-30 NOTE — Evaluation (Addendum)
Occupational Therapy Evaluation Patient Details Name: Dylan Moreno MRN: [id number] DOB: [birth year] Today's Date: 12/30/2012 Time: 1026-1050 OT Time Calculation (min): 24 min  OT Assessment / Plan / Recommendation History of present illness PT presents to [facility] with facial drooping, dizziness, and some instability.   Clinical Impression   Pt moving well during evaluation. Education provided to pt and family. Recommended pt to have MD check his vision and ask about driving prior to d/c.    OT Assessment  Patient does not need any further OT services    Follow Up Recommendations  No OT follow up;Supervision - Intermittent    Barriers to Discharge      Equipment Recommendations  None recommended by OT    Recommendations for Other Services    Frequency  Min 2X/week    Precautions / Restrictions Precautions Precaution Comments:  Pertinent Vitals/Pain No pain reported.     ADL  Lower Body Bathing: Supervision/safety Where Assessed - Lower Body Bathing: Supported standing Lower Body Dressing: Supervision/safety Where Assessed - Lower Body Dressing: Unsupported sit to stand Toilet Transfer: Supervision/safety Toilet Transfer Method: Sit to Barista: Regular height toilet Tub/Shower Transfer: Insurance risk surveyor Method: Science writer: Other (comment) (practiced stepping over) Transfers/Ambulation Related to ADLs: Supervision for ambulation and Mod I for sit <> stand from recliner chair. Supervision for stand to sit to simulated regular height toilet. Min guard for tub transfer. ADL Comments: Educated on signs and symptoms of stroke and importance of getting help right away. Recommended sitting for LB dressing for safety and also for wife to be with pt when he gets in and out of tub. Told pt to have doctor check his vision and ask about driving prior to d/c. Recommended family pick up rugs in house to prevent any falls as  son reports pt has been stumbling at home-recommended non skid in bathroom if there is a rug in there.    OT Diagnosis:    OT Problem List:   OT Treatment Interventions:     OT Goals(Current goals can be found in the care plan section)    Visit Information  Last OT Received On: 12/30/12 Assistance Needed: +1 History of Present Illness: PT presents to [facility] with facial drooping, dizziness, and some instability.       Prior Functioning     Home Living Family/patient expects to be discharged to:: Private residence Living Arrangements: Spouse/significant other Available Help at Discharge: Family Type of Home: House Home Access: Level entry Home Layout: Two level Alternate Level Stairs-Number of Steps: 14 Alternate Level Stairs-Rails: Right;Left Home Equipment: Cane - single point Additional Comments: standard height toilet, tub shower and walk in shower Prior Function Level of Independence: Independent Communication Communication: No difficulties Dominant Hand: Right         Vision/Perception Vision - History Baseline Vision: Wears glasses all the time Patient Visual Report: No change from baseline Vision - Assessment Vision Assessment: Vision tested Tracking/Visual Pursuits: Able to track stimulus in all quads without difficulty Visual Fields:  (inconsistent -noted in left upper quadrant)   Cognition  Cognition Arousal/Alertness: Awake/alert Behavior During Therapy: WFL for tasks assessed/performed Overall Cognitive Status: Within Functional Limits for tasks assessed    Extremity/Trunk Assessment Upper Extremity Assessment Upper Extremity Assessment: Overall WFL for tasks assessed;RUE deficits/detail;LUE deficits/detail RUE Deficits / Details: dupuytrens contractures LUE Deficits / Details: dupuytrens contractures Lower Extremity Assessment Lower Extremity Assessment: Defer to PT evaluation     Mobility Bed Mobility Bed  Mobility: Not  assessed Transfers Transfers: Sit to Stand;Stand to Sit Sit to Stand: 6: Modified independent (Device/Increase time);From chair/3-in-1;From toilet Stand to Sit: 5: Supervision;To chair/3-in-1;To toilet;6: Modified independent (Device/Increase time) Details for Transfer Assistance: supervision for stand to sit to simulated regular height toilet-cues for technique.     Exercise        End of Session OT - End of Session Activity Tolerance: Patient tolerated treatment well Patient left: in chair;with call bell/phone within reach;with family/visitor present  GO Functional Assessment Tool Used: clinical judgment Functional Limitation: Self care Self Care Current Status (Z6109): At least 1 percent but less than 20 percent impaired, limited or restricted Self Care Goal Status (U0454): At least 1 percent but less than 20 percent impaired, limited or restricted Self Care Discharge Status [phone]): At least 1 percent but less than 20 percent impaired, limited or restricted   Earlie Raveling OTR/L [phone] 12/30/2012, 12:32 PM

## 2012-12-30 NOTE — Progress Notes (Signed)
Patient stable, MRI negative for acute infarct, NIH 0. discharged home per MD order. Stroke/TIA education given to patient and family. Patient assisted out into lobby to wait for his ride.

## 2012-12-30 NOTE — Evaluation (Signed)
Physical Therapy Evaluation Patient Details Name: Dylan Moreno MRN: [id number] DOB: [birth year] Today's Date: 12/30/2012 Time: 6578-4696 PT Time Calculation (min): 25 min  PT Assessment / Plan / Recommendation History of Present Illness  PT presents to [facility] with facial drooping, dizziness, and some instability.  Clinical Impression  Patient has returned to baseline level of function with no significant deficits. Spoke with patient and son at length regarding BEFAST stroke symptoms. Patient and family very appreciative. No further acute PT needs at this time. Will sign off. Pt in agreement.    PT Assessment  Patent does not need any further PT services    Follow Up Recommendations  No PT follow up          Equipment Recommendations  None recommended by PT          Precautions / Restrictions Precautions Precaution Comments: Educated on BE FAST stroke symptoms   Pertinent Vitals/Pain No pain at this time, some very mild dizziness still      Mobility  Bed Mobility Bed Mobility: Not assessed Transfers Transfers: Sit to Stand;Stand to Sit Sit to Stand: 7: Independent Stand to Sit: 7: Independent Ambulation/Gait Ambulation/Gait Assistance: 7: Independent Ambulation Distance (Feet): 200 Feet Assistive device: None Ambulation/Gait Assistance Details: steady with gait Gait Pattern: Within Functional Limits Gait velocity: WFL General Gait Details: steady with gait Stairs: Yes Stairs Assistance: 6: Modified independent (Device/Increase time) Stair Management Technique: One rail Right;Alternating pattern;Forwards Number of Stairs: 14 Modified Rankin (Stroke Patients Only) Pre-Morbid Rankin Score: No symptoms Modified Rankin: No significant disability       Acute Rehab PT Goals PT Goal Formulation: No goals set, d/c therapy  Visit Information  Last PT Received On: 12/30/12 Assistance Needed: +1 History of Present Illness: PT presents to [facility] [place] with facial drooping,  dizziness, and some instability.       Prior Functioning  Home Living Family/patient expects to be discharged to:: Private residence Living Arrangements: Spouse/significant other Available Help at Discharge: Family Type of Home: House Home Access: Level entry Home Layout: Two level Alternate Level Stairs-Number of Steps: 14 Alternate Level Stairs-Rails: Right;Left Home Equipment: Cane - single point Additional Comments: standard height toilet, tub shower and walk in shower Prior Function Level of Independence: Independent Communication Communication: No difficulties Dominant Hand: Right    Cognition  Cognition Arousal/Alertness: Awake/alert Behavior During Therapy: WFL for tasks assessed/performed    Extremity/Trunk Assessment Lower Extremity Assessment Lower Extremity Assessment: Overall WFL for tasks assessed   Balance High Level Balance High Level Balance Activites: Side stepping;Backward walking;Direction changes;Turns;Head turns High Level Balance Comments: steady with activities  End of Session PT - End of Session Equipment Utilized During Treatment: Gait belt Activity Tolerance: Patient tolerated treatment well Patient left: in chair;with call bell/phone within reach;with family/visitor present Nurse Communication: Mobility status  GP Functional Assessment Tool Used: clinical judgement Functional Limitation: Mobility: Walking and moving around Mobility: Walking and Moving Around Current Status (E9528): At least 1 percent but less than 20 percent impaired, limited or restricted Mobility: Walking and Moving Around Goal Status [phone]): At least 1 percent but less than 20 percent impaired, limited or restricted Mobility: Walking and Moving Around Discharge Status [phone]): At least 1 percent but less than 20 percent impaired, limited or restricted   Fabio Asa 12/30/2012, 10:21 AM Charlotte Crumb, PT DPT  [phone]

## 2012-12-30 NOTE — Progress Notes (Signed)
  Echocardiogram 2D Echocardiogram has been performed.  Cathie Beams 12/30/2012, 9:36 AM

## 2012-12-30 NOTE — Discharge Summary (Signed)
Physician Discharge Summary  Patient ID: Dylan Moreno MRN: [id number] DOB/AGE: 01-Jan-1941 72 y.o.  Admit date: 12/29/2012 Discharge date: 12/30/2012  Admission Diagnoses:  Discharge Diagnoses:  Principal Problem:   TIA (transient ischemic attack) Active Problems:   Hyperlipemia   ADHD (attention deficit hyperactivity disorder)   Discharged Condition: stable  Hospital Course:  Patient presents with hospital with concern for left side of mouth drooping. This was witnessed by his son, not witnessed by any physicians. Admission was deemed necessary for further evaluation and treatment. Patient was examined this morning in MRI suite, he states that he was smoking a cigar that morning of admission  and felt a little woozy. He denied any other focal neuro deficit no dysarthria. He states his son noticed that the left side of his face was drooping. Because of that he was presented to the urgent care and ultimately to the hospital. In the urgent care and hospital there were no neurodeficits. Admission was deemed necessary for further evaluation and treatment 4 probable TIA. Patient underwent CT of the brain MRI of the brain without any acute CVA. Carotid Doppler without any significant stenosis, 2-D echo without any abnormality. Patient at baseline takes a lipid medication and he states he takes 81 mg aspirin. He does smoke. We discussed increasing it to 325 mg aspirin, the need to stop smoking.patient's MRI did show some discrepancy between atrophy and ventricular prominence suggesting normal pressure hydrocephalus. Currently patient is asymptomatic gait within normal limits, no urinary incontinence. We will have further outpatient followup of this  Consults:    Significant Diagnostic Studies:Ct Abdomen Pelvis W Wo Contrast  12/04/2012   CLINICAL DATA:  CT screening raises question of left renal mass. No known trauma or history of cancer.  EXAM: CT ABDOMEN AND PELVIS WITHOUT AND WITH CONTRAST   TECHNIQUE: Multidetector CT imaging of the abdomen and pelvis was performed without contrast material in one or both body regions, followed by contrast material(s) and further sections in one or both body regions.  CONTRAST:  OMNIPAQUE IOHEXOL 350 MG/ML SOLN  COMPARISON:  CT of the chest on 11/24/2012, ultrasound of the kidneys on 11/26/2012  FINDINGS: Images are performed of the upper abdomen prior to and following the administration of intravenous contrast. Within the lower pole region of the right kidney, there are 2 simple cysts, measuring 2.3 and 0.8 cm. Within the left kidney, no discrete mass is identified. The abnormalities questioned on previous CT and ultrasound exams are felt to represent normal anatomic lobation kidney. There is no hydronephrosis. No intrarenal calculi or ureteral stones are identified.  No focal abnormality identified within the liver, spleen, pancreas, adrenal glands. Gallbladder is present.  The stomach and small bowel loops are normal in appearance. There are numerous colonic diverticula. No evidence for acute diverticulitis. The appendix is well seen and has a normal appearance. No retroperitoneal or mesenteric adenopathy. No evidence for aortic aneurysm.  Bone windows are unremarkable.  IMPRESSION: 1. The kidneys are well evaluated. There is no evidence for left renal mass. 2. Right lower pole renal cyst. 3. Diverticulosis without evidence for acute diverticulitis.   Electronically Signed   By: Rosalie Gums M.D.   On: 12/04/2012 15:01   Ct Head Wo Contrast  12/29/2012   CLINICAL DATA:  Facial droop  EXAM: CT HEAD WITHOUT CONTRAST  TECHNIQUE: Contiguous axial images were obtained from the base of the skull through the vertex without intravenous contrast.  COMPARISON:  None available  FINDINGS: No acute intracranial  hemorrhage or large vessel territory infarct identified. A few scattered and confluent hypodensities within the periventricular and deep white matter is most  consistent with chronic microvascular ischemic disease. The ventricles are diffusely enlarged out of proportion with the cortical sulci, which can be seen with NPH. No mass lesion or midline shift. No extra-axial fluid collection.  Calvarium is intact.  Orbits are normal.  Paranasal sinuses and mastoid air cells are clear.  IMPRESSION: 1. No acute intracranial process. 2. Ventricular prominence out of proportion to the cortical sulci, which can be seen with NPH. Clinical correlation is recommended.   Electronically Signed   By: Rise Mu M.D.   On: 12/29/2012 19:08   Mri Brain Without Contrast  12/30/2012   CLINICAL DATA:  TIA.  Mouth drooping on the left.  EXAM: MRI HEAD WITHOUT CONTRAST  MRA HEAD WITHOUT CONTRAST  TECHNIQUE: Multiplanar, multiecho pulse sequences of the brain and surrounding structures were obtained without intravenous contrast. Angiographic images of the head were obtained using MRA technique without contrast.  COMPARISON:  CT head without contrast 12/29/2012  FINDINGS: MRI HEAD FINDINGS  As previously noted, the ventricles are disproportionate to the degree of atrophy. No acute infarct, hemorrhage column or mass lesion is present. No significant extra-axial fluid collection is present. There is no significant white matter disease.  Flow is present in the major intracranial arteries. The globes and orbits are intact. Mild mucosal thickening is noted in the anterior ethmoid air cells and bilateral maxillary sinuses. The mastoid air cells are clear.  MRA HEAD FINDINGS  The internal carotid arteries are within normal limits from the high cervical segments through the ICA termini. The right A1 is aplastic. The anterior communicating artery is patent and both sides fill from a normal-appearing left A1 segment. The M1 segments are unremarkable. The MCA bifurcations are intact bilaterally. The ACA and MCA branch vessels are within normal limits.  The vertebral arteries are incompletely  imaged. The distal vertebral arteries are intact. The left vertebral artery is the dominant vessel. The basilar artery is within normal limits. The right posterior cerebral artery is of fetal type. There is mild attenuation of distal PCA branch vessels. The right AICA is dominant.  IMPRESSION: 1. Ventricular prominence is disproportionate to the degree of atrophy, suggesting normal pressure hydrocephalus. 2. No other acute intracranial abnormality. Specifically, there is no evidence for acute or subacute infarct. 3. Mild distal small vessel disease is evident on the MRA without significant proximal stenosis, aneurysm, or branch vessel occlusion.   Electronically Signed   By: Gennette Pac M.D.   On: 12/30/2012 08:43   Mr Maxine Glenn Head/brain Wo Cm  12/30/2012   CLINICAL DATA:  TIA.  Mouth drooping on the left.  EXAM: MRI HEAD WITHOUT CONTRAST  MRA HEAD WITHOUT CONTRAST  TECHNIQUE: Multiplanar, multiecho pulse sequences of the brain and surrounding structures were obtained without intravenous contrast. Angiographic images of the head were obtained using MRA technique without contrast.  COMPARISON:  CT head without contrast 12/29/2012  FINDINGS: MRI HEAD FINDINGS  As previously noted, the ventricles are disproportionate to the degree of atrophy. No acute infarct, hemorrhage column or mass lesion is present. No significant extra-axial fluid collection is present. There is no significant white matter disease.  Flow is present in the major intracranial arteries. The globes and orbits are intact. Mild mucosal thickening is noted in the anterior ethmoid air cells and bilateral maxillary sinuses. The mastoid air cells are clear.  MRA HEAD FINDINGS  The  internal carotid arteries are within normal limits from the high cervical segments through the ICA termini. The right A1 is aplastic. The anterior communicating artery is patent and both sides fill from a normal-appearing left A1 segment. The M1 segments are unremarkable. The  MCA bifurcations are intact bilaterally. The ACA and MCA branch vessels are within normal limits.  The vertebral arteries are incompletely imaged. The distal vertebral arteries are intact. The left vertebral artery is the dominant vessel. The basilar artery is within normal limits. The right posterior cerebral artery is of fetal type. There is mild attenuation of distal PCA branch vessels. The right AICA is dominant.  IMPRESSION: 1. Ventricular prominence is disproportionate to the degree of atrophy, suggesting normal pressure hydrocephalus. 2. No other acute intracranial abnormality. Specifically, there is no evidence for acute or subacute infarct. 3. Mild distal small vessel disease is evident on the MRA without significant proximal stenosis, aneurysm, or branch vessel occlusion.   Electronically Signed   By: Gennette Pac M.D.   On: 12/30/2012 08:43   2-D echo Study Conclusions  - Left ventricle: The cavity size was normal. Systolic function was normal. The estimated ejection fraction was in the range of 60% to 65%. Wall motion was normal; there were no regional wall motion abnormalities. - Mitral valve: Calcified annulus. Mildly thickened leaflets . Mild posterior leafletprolapse.  Carotid Doppler *PRELIMINARY RESULTS*  Vascular Ultrasound  Carotid Duplex (Doppler) has been completed.  Findings suggest 1-39% internal carotid artery stenosis bilaterally. Vertebral arteries are patent with antegrade flow.    Discharge Exam: Blood pressure 130/80, pulse 80, temperature 97.7 F (36.5 C), temperature source Oral, resp. rate 18, height 6\' 2"  (1.88 m), weight 86.3 kg (190 lb 4.1 oz), SpO2 99.00%. General appearance: alert and cooperative Resp: clear to auscultation bilaterally Cardio: regular rate and rhythm, S1, S2 normal, no murmur, click, rub or gallop Extremities: extremities normal, atraumatic, no cyanosis or edema Neurologic: Alert and oriented X 3, normal strength and tone. Normal  symmetric reflexes. Normal coordination and gait  Disposition:  home     Medication List         amphetamine-dextroamphetamine 20 MG tablet  Commonly known as:  ADDERALL  Take 20 mg by mouth 2 (two) times daily.     aspirin 325 MG EC tablet  Take 1 tablet (325 mg total) by mouth daily.     atorvastatin 10 MG tablet  Commonly known as:  LIPITOR  Take 10 mg by mouth daily.     multivitamin with minerals Tabs tablet  Take 1 tablet by mouth daily.           Follow-up Information   Follow up with Naif Alabi D, MD. Call in 1 week.   Specialty:  Internal Medicine   Contact information:   [address] [place] [address] [phone]       Signed: Katy Apo 12/30/2012, 4:08 PM

## 2013-01-06 DIAGNOSIS — R930 Abnormal findings on diagnostic imaging of skull and head, not elsewhere classified: Secondary | ICD-10-CM | POA: Diagnosis not present

## 2013-01-06 DIAGNOSIS — G459 Transient cerebral ischemic attack, unspecified: Secondary | ICD-10-CM | POA: Diagnosis not present

## 2013-01-30 ENCOUNTER — Other Ambulatory Visit: Payer: Self-pay | Admitting: Dermatology

## 2013-01-30 DIAGNOSIS — L820 Inflamed seborrheic keratosis: Secondary | ICD-10-CM | POA: Diagnosis not present

## 2013-01-30 DIAGNOSIS — D485 Neoplasm of uncertain behavior of skin: Secondary | ICD-10-CM | POA: Diagnosis not present

## 2013-01-30 DIAGNOSIS — L570 Actinic keratosis: Secondary | ICD-10-CM | POA: Diagnosis not present

## 2013-01-30 DIAGNOSIS — Z85828 Personal history of other malignant neoplasm of skin: Secondary | ICD-10-CM | POA: Diagnosis not present

## 2013-02-03 ENCOUNTER — Ambulatory Visit (INDEPENDENT_AMBULATORY_CARE_PROVIDER_SITE_OTHER): Payer: Medicare Other | Admitting: Neurology

## 2013-02-03 ENCOUNTER — Encounter: Payer: Self-pay | Admitting: Neurology

## 2013-02-03 VITALS — BP 142/76 | HR 76 | Resp 18 | Ht 73.75 in | Wt 198.0 lb

## 2013-02-03 DIAGNOSIS — G459 Transient cerebral ischemic attack, unspecified: Secondary | ICD-10-CM | POA: Diagnosis not present

## 2013-02-03 DIAGNOSIS — E785 Hyperlipidemia, unspecified: Secondary | ICD-10-CM

## 2013-02-03 MED ORDER — CLOPIDOGREL BISULFATE 75 MG PO TABS: 75.0000 mg | ORAL_TABLET | Freq: Every day | ORAL | Status: DC

## 2013-02-03 NOTE — Patient Instructions (Addendum)
1.  Holter monitor - 48 hour 2.  Stop aspirin.   3.  Start taking plavix 75mg  daily 4.  Patient to discuss to cholesterol medication with primary care physician 5.  Encouraged to follow-up with primary care physician for blood pressure management 6.  Encouraged low-carbohydrate, low-salt diet 7.  Return to clinic in 58-months    You will always be at an increased risk of stroke.  Stroke is a medical emergency.  Know these warning signs of stroke. Every second counts:  Sudden numbness or weakness of the face, arm or leg, especially on one side of the body,  Sudden confusion, trouble speaking or understanding,  Sudden trouble seeing in one eye or both eyes,  Sudden trouble walking, dizziness, loss of balance or coordination,  Sudden severe headache with no known cause.  If you or someone with you has one or more of these signs, don't delay!  Immediately call 9-1-1, or the emergency medical services (EMS) number so an ambulance (ideally with advanced life support) can be sent for you.  Also, check the time so that you will know when the symptoms first appeared. It's very important to take immediate action. Medical treatment may be available if action is taken early enough.   General guidelines for stroke risk factor management, if present  Hypertension  Target blood pressure <140/90, <130/80 for high risk; normal is 120/80  Hyperlipidemia  Target total cholesterol < 200  Target LDL <100, < 70 for high risk  Target HDL >45 for men, >55 for women  Target triglycerides <150  Diabetes  Target HgbA1c <7%  Smoking  Target is smoking cessation  Physical inactivity  Target is exercise at least 3 times per week  Target waist circumference, in inches is <35 for women and <40 for men

## 2013-02-03 NOTE — Progress Notes (Signed)
[facility] Clinic Note - Initial Visit   Date: 02/03/2013    Dylan Moreno MRN: [id number] DOB: [birth year]   Dear Dr Delfina Redwood:  Thank you for your kind referral of Dylan Moreno for consultation of TIA. Although his history is well known to you, please allow [place] to reiterate it for the purpose of our medical record. The patient was accompanied to the clinic by his friend/businesss partner who also provides collateral information.     History of Present Illness: Dylan Moreno is a 73 y.o. right-handed Caucasian male with history of hyperlipidemia, tobacco use, ADD, recent TIA (12/29/2012) presenting for hospital discharge follow-up TIA.    He presented to the Emergency Department on 12/29/2012 with acute onset of left facial droop, lasting one hour.  He woke up that morning feeling dizzy and lightheaded.  His son, who is a Agricultural consultant, noticed his facial droop and brought him to the ED for evaluation.  He CT of the brain, MRI/A of the brain which did not show any acute stroke. There was a note of ventriculomegaly suggestive of NPH.  US carotids showed patent ICAs and echocardiogram did not reveal an embolic source.  He was previously taking lipitor 10mg  and was taking aspirin 81mg  daily.  He was discharged on aspirin 325mg  and told to follow-up with his primary care physician and neurology.  Since his discharge, he has not had any new neurological symptoms.  He is doing well and has tried to cut back on smoking cigars.  He reportedly stopped smoking cigarettes about 3 years ago.  No prior personal or family history of stroke or TIA.  Out-side paper records, electronic medical record, and images have been reviewed where available and summarized as:  US carotids 12/30/2012:  Findings suggest 1-39% internal carotid artery stenosis bilaterally. Vertebral arteries are patent with antegrade flow.  Echo 12/30/2012:   - Left ventricle: The cavity size was normal. Systolic  function was normal. The estimated ejection fraction was in the range of 60% to 65%. Wall motion was normal; there were no regional wall motion abnormalities. - Mitral valve: Calcified annulus. Mildly thickened leaflets . Mild posterior leafletprolapse.  MRI/A brain 12/29/2012: 1. Ventricular prominence is disproportionate to the degree of atrophy, suggesting normal pressure hydrocephalus.  2. No other acute intracranial abnormality. Specifically, there is no evidence for acute or subacute infarct.  3. Mild distal small vessel disease is evident on the MRA without significant proximal stenosis, aneurysm, or branch vessel occlusion.   Lab Results  Component Value Date   HGBA1C 5.8* 12/30/2012   Lab Results  Component Value Date   CHOL 190 12/30/2012   HDL 32* 12/30/2012   LDLCALC 102* 12/30/2012   TRIG 281* 12/30/2012   CHOLHDL 5.9 12/30/2012     Past Medical History  Diagnosis Date  . Hyperlipemia   . Arthritis   . ADHD (attention deficit hyperactivity disorder)   . Wears glasses   . Hypercholesterolemia   . Peyronie's disease   . Kidney stones   . TIA (transient ischemic attack)   . Hypertension   . Anxiety   . GERD (gastroesophageal reflux disease)     Past Surgical History  Procedure Laterality Date  . Dupuytren contracture release  2012    left hand  . Tonsillectomy    . Shoulder arthroscopy w/ rotator cuff repair      left  . Colonoscopy    . Cystoscopy w/ ureteroscopy w/ lithotripsy  2011  . Hand surgery  right  . Dupuytren contracture release Right 10/30/2012    Procedure: DUPUYTREN CONTRACTURE RELEASE RIGHT PALM,RING AND SMALL FINGER;  Surgeon: Cammie Sickle., MD;  Location: [facility];  Service: Orthopedics;  Laterality: Right;     Medications:  Current Outpatient Prescriptions on File Prior to Visit  Medication Sig Dispense Refill  . amphetamine-dextroamphetamine (ADDERALL) 20 MG tablet Take 20 mg by mouth 2 (two) times daily.       Marland Kitchen aspirin EC 325 MG EC tablet Take 1 tablet (325 mg total) by mouth daily.  30 tablet  0  . atorvastatin (LIPITOR) 10 MG tablet Take 10 mg by mouth daily.      . Multiple Vitamin (MULTIVITAMIN WITH MINERALS) TABS tablet Take 1 tablet by mouth daily.       No current facility-administered medications on file prior to visit.    Allergies: No Known Allergies  Family History: Family History  Problem Relation Age of Onset  . Heart attack Mother     Deceased, 49  . Hypothyroidism Father     Deceased, 84  . Melanoma Brother     Living, 51  . Healthy Son     Social History: History   Social History  . Marital Status: Married    Spouse Name: N/A    Number of Children: N/A  . Years of Education: N/A   Occupational History  . Not on file.   Social History Main Topics  . Smoking status: Light Tobacco Smoker -- 1.00 packs/day for 30 years    Types: Cigarettes, Cigars    Last Attempt to Quit: 10/30/2011  . Smokeless tobacco: Not on file     Comment: smokes occasionally  . Alcohol Use: Yes     Comment: every night (2-3oz of scotch or gin martini)  . Drug Use: No  . Sexual Activity: Not on file     Comment: mostly quit 2011-smokes occ   Other Topics Concern  . 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   He lives at home and has one grown son.   Highest level of education: 1.5 years of college    Review of Systems:  CONSTITUTIONAL: No fevers, chills, night sweats, or weight loss.   EYES: No visual changes or eye pain ENT: No hearing changes.  No history of nose bleeds.   RESPIRATORY: No cough, wheezing and shortness of breath.   CARDIOVASCULAR: Negative for chest pain, and palpitations.   GI: Negative for abdominal discomfort, blood in stools or black stools.  No recent change in bowel habits.   GU:  No history of incontinence.   MUSCLOSKELETAL: No history of joint pain or swelling.  No myalgias.   SKIN: Negative  for lesions, rash, and itching.   HEMATOLOGY/ONCOLOGY: Negative for prolonged bleeding, bruising easily, and swollen nodes.  No history of cancer.   ENDOCRINE: Negative for cold or heat intolerance, polydipsia or goiter.   PSYCH:  No depression or anxiety symptoms.   NEURO: As Above.   Vital Signs:  BP 142/76  Pulse 76  Resp 18  Ht 6' 1.75" (1.873 m)  Wt 198 lb (89.812 kg)  BMI 25.60 kg/m2 Pain Scale: 0 on a scale of 0-10   General Medical Exam:   General:  Well appearing, comfortable.   Eyes/ENT: see cranial nerve examination.   Neck: No masses appreciated.  Full range of motion without tenderness.  No carotid bruits. Respiratory:  Clear to auscultation, good air entry bilaterally.   Cardiac:  Regular rate and rhythm, no murmur.   Back:  No pain to palpation of spinous processes.   Extremities:  Several Dupuytren's noted on the hands bilaterally with previous surgical scars. Skin:  Skin color, texture, turgor normal. No rashes or lesions.  Neurological Exam: MENTAL STATUS including orientation to time, place, person, recent and remote memory, attention span and concentration, language, and fund of knowledge is normal.  Speech is not dysarthric, but there is intermittent stuttering of speech (old)  CRANIAL NERVES: II:  No visual field defects.  Unremarkable fundi.   III-IV-VI: Pupils equal round and reactive to light.  Normal conjugate, extra-ocular eye movements in all directions of gaze.  No nystagmus.  No ptosis.   V:  Normal facial sensation.   VII:  Normal facial symmetry and movements.   VIII:  Normal hearing and vestibular function.   IX-X:  Normal palatal movement.   XI:  Normal shoulder shrug and head rotation.   XII:  Normal tongue strength and range of motion, no deviation or fasciculation.  MOTOR:  No atrophy, fasciculations or abnormal movements.  No pronator drift.  Tone is normal.    Right Upper Extremity:    Left Upper Extremity:    Deltoid  5/5   Deltoid   5/5   Biceps  5/5   Biceps  5/5   Triceps  5/5   Triceps  5/5   Wrist extensors  5/5   Wrist extensors  5/5   Wrist flexors  5/5   Wrist flexors  5/5   Finger extensors  5/5   Finger extensors  5/5   Finger flexors  5/5   Finger flexors  5/5   Dorsal interossei  5/5   Dorsal interossei  5/5   Abductor pollicis  5/5   Abductor pollicis  5/5   Tone (Ashworth scale)  0  Tone (Ashworth scale)  0   Right Lower Extremity:    Left Lower Extremity:    Hip flexors  5/5   Hip flexors  5/5   Hip extensors  5/5   Hip extensors  5/5   Knee flexors  5/5   Knee flexors  5/5   Knee extensors  5/5   Knee extensors  5/5   Dorsiflexors  5/5   Dorsiflexors  5/5   Plantarflexors  5/5   Plantarflexors  5/5   Toe extensors  5/5   Toe extensors  5/5   Toe flexors  5/5   Toe flexors  5/5   Tone (Ashworth scale)  0  Tone (Ashworth scale)  0   MSRs:  Right                                                                 Left brachioradialis 1+  brachioradialis 1+  biceps 1+  biceps 1+  triceps 1+  triceps 1+  patellar 1+  patellar 1+  ankle jerk 1+  ankle jerk 1+  Hoffman no  Hoffman no  plantar response down  plantar response down   SENSORY:  Normal and symmetric perception of light touch, pinprick, vibration, and proprioception.  Romberg's sign absent.   COORDINATION/GAIT: Normal finger-to- nose-finger and heel-to-shin.  Intact rapid alternating movements bilaterally.  Able to rise from a chair without using arms.  Gait narrow based and stable. Tandem and stressed gait intact.    IMPRESSION: Dylan Moreno is a 73 year-old gentleman presenting for evaluation of TIA manifested with left facial droop on 12/29/2012.  Neurological exam today does not disclose any focal deficits.  His prior work-up including vessel imaging and echocardiogram did not reveal any etiology for his TIA.  I would like to obtain extended cardiac monitoring with holter to evaluate for possible underlying arrthymias.  Further, I  discussed that since he was already on aspirin at the time of the event, I would like to switch him to plavix.  Risks and benefits of the medication discussed.    I had a lengthy discussion regarding secondary stroke risk prevention including tobacco cessation, management of hyperlipidemia, hypertension, and staying active.  I gave him the opportunity to ask questions and answered them to the best of my ability.  General guidelines for stroke risk factor management, if present  Hypertension  Target blood pressure <140/90, <130/80 for high risk; normal is 120/80  Hyperlipidemia  Target total cholesterol < 200  Target LDL <100, < 70 for high risk  Target HDL >45 for men, >55 for women  Target triglycerides <150  Diabetes  Target HgbA1c <7%  Smoking  Target is smoking cessation  Physical inactivity  Target is exercise at least 3 times per week  Target waist circumference, in inches is <35 for women and <40 for men   PLAN/RECOMMENDATIONS:  1.  Holter monitor - 48 hr 2.  Stop aspirin.   3.  Start taking plavix 75mg  daily 4.  Patient to discuss to cholesterol medication with primary care physician 5.  Encouraged to follow-up with primary care physician for blood pressure management since it was slightly elevated at today's visit 6.  Encouraged low-carbohydrate, low-salt diet 7.  Return to clinic in 55-months   The duration of this appointment visit was 60 minutes of face-to-face time with the patient.  Greater than 50% of this time was spent in counseling, explanation of diagnosis, planning of further management, and coordination of care.   Thank you for allowing me to participate in patient's care.  If I can answer any additional questions, I would be pleased to do so.    Sincerely,    Dylan Pointer K. Posey Pronto, DO

## 2013-02-04 ENCOUNTER — Telehealth: Payer: Self-pay | Admitting: *Deleted

## 2013-02-04 NOTE — Telephone Encounter (Signed)
Message copied by Chester Holstein on Wed Feb 04, 2013  8:24 AM ------      Message from: Alda Berthold      Created: Tue Feb 03, 2013  4:41 PM       Please fax my clinic note to Dr. Seward Carol at [facility] ------

## 2013-02-16 DIAGNOSIS — G459 Transient cerebral ischemic attack, unspecified: Secondary | ICD-10-CM | POA: Diagnosis not present

## 2013-02-16 DIAGNOSIS — F17200 Nicotine dependence, unspecified, uncomplicated: Secondary | ICD-10-CM | POA: Diagnosis not present

## 2013-02-25 ENCOUNTER — Telehealth: Payer: Self-pay | Admitting: *Deleted

## 2013-02-25 ENCOUNTER — Other Ambulatory Visit: Payer: Self-pay | Admitting: *Deleted

## 2013-02-25 ENCOUNTER — Encounter: Payer: Self-pay | Admitting: *Deleted

## 2013-02-25 DIAGNOSIS — R42 Dizziness and giddiness: Secondary | ICD-10-CM

## 2013-02-25 DIAGNOSIS — G459 Transient cerebral ischemic attack, unspecified: Secondary | ICD-10-CM

## 2013-02-25 NOTE — Telephone Encounter (Signed)
Message copied by Chester Holstein on Wed Feb 25, 2013  2:48 PM ------      Message from: [facility], JADE L      Created: Wed Feb 25, 2013 10:14 AM       I faxed a request to Ivin Booty at [facility] at [phone] to set up 48 hour holter monitor. I don't see where the patient was scheduled. Can you follow up since this is a Posey Pronto patient?             Thanks,       Luvenia Starch            ----- Message -----         From: Annamaria Helling, CMA         Sent: 02/04/2013   7:56 AM           To: Annamaria Helling, CMA            holter monitor 48       Sent request 02/03/13       ------

## 2013-02-25 NOTE — Telephone Encounter (Signed)
Attempted to contact Ivin Booty to find out if pt has been set up yet.  LM for her to call me back.  [facility] [phone]

## 2013-03-03 DIAGNOSIS — F17200 Nicotine dependence, unspecified, uncomplicated: Secondary | ICD-10-CM | POA: Diagnosis not present

## 2013-03-03 DIAGNOSIS — G459 Transient cerebral ischemic attack, unspecified: Secondary | ICD-10-CM | POA: Diagnosis not present

## 2013-03-03 DIAGNOSIS — E782 Mixed hyperlipidemia: Secondary | ICD-10-CM | POA: Diagnosis not present

## 2013-03-03 DIAGNOSIS — R030 Elevated blood-pressure reading, without diagnosis of hypertension: Secondary | ICD-10-CM | POA: Diagnosis not present

## 2013-03-03 DIAGNOSIS — Z1331 Encounter for screening for depression: Secondary | ICD-10-CM | POA: Diagnosis not present

## 2013-03-03 DIAGNOSIS — Z0000 Encounter for general adult medical examination without abnormal findings: Secondary | ICD-10-CM | POA: Diagnosis not present

## 2013-03-06 ENCOUNTER — Encounter (INDEPENDENT_AMBULATORY_CARE_PROVIDER_SITE_OTHER): Payer: Medicare Other

## 2013-03-06 ENCOUNTER — Encounter: Payer: Self-pay | Admitting: Radiology

## 2013-03-06 DIAGNOSIS — R42 Dizziness and giddiness: Secondary | ICD-10-CM

## 2013-03-06 DIAGNOSIS — G459 Transient cerebral ischemic attack, unspecified: Secondary | ICD-10-CM

## 2013-03-06 NOTE — Progress Notes (Signed)
Patient ID: Dylan Moreno, male   DOB: [birth year], 73 y.o.   MRN: [id number] E cardio 48hr holter applied

## 2013-05-04 ENCOUNTER — Ambulatory Visit (INDEPENDENT_AMBULATORY_CARE_PROVIDER_SITE_OTHER): Payer: Medicare Other | Admitting: Neurology

## 2013-05-04 ENCOUNTER — Encounter: Payer: Self-pay | Admitting: Neurology

## 2013-05-04 VITALS — BP 148/74 | HR 94 | Wt 191.2 lb

## 2013-05-04 DIAGNOSIS — G459 Transient cerebral ischemic attack, unspecified: Secondary | ICD-10-CM | POA: Diagnosis not present

## 2013-05-04 NOTE — Patient Instructions (Signed)
1.  Continue plavix 75mg  daily 2.  Continue lipitor 10mg  daily 3.  Encouraged to follow low carbohydrate, low salt diet 4.  Encouraged tobacco cessation 5.  Your blood was 148/74, please monitor your blood pressure and share with Dr. Delfina Redwood 6.  Return to clinic 39-months   General guidelines for stroke risk factor management, if present  Hypertension  Target blood pressure <140/90, <130/80 for high risk; normal is 120/80  Hyperlipidemia  Target total cholesterol < 200  Target LDL <100, < 70 for high risk  Target HDL >45 for men, >55 for women  Target triglycerides <150  Diabetes  Target HgbA1c <7%  Smoking  Target is smoking cessation  Physical inactivity  Target is exercise at least 3 times per week  Target waist circumference, in inches is <35 for women and <40 for men

## 2013-05-04 NOTE — Progress Notes (Signed)
Note faxed.

## 2013-05-04 NOTE — Progress Notes (Signed)
Follow-up Visit   Date: 05/04/2013    Dylan Moreno MRN: [id number] DOB: [birth year]   Interim History: Dylan Moreno is a 73 y.o. right-handed Caucasian male with history of hyperlipidemia, tobacco use, ADD, recent TIA (12/29/2012) returning to the clinic for follow-up of TIA.  The patient was accompanied to the clinic by self.   History of present illness: He presented to the Emergency Department on 12/29/2012 with acute onset of left facial droop, lasting one hour. He woke up that morning feeling dizzy and lightheaded. His son, who is a Agricultural consultant, noticed his facial droop and brought him to the ED for evaluation. He CT of the brain, MRI/A of the brain which did not show any acute stroke. There was a note of ventriculomegaly suggestive of NPH. US carotids showed patent ICAs and echocardiogram did not reveal an embolic source. He was previously taking lipitor 10mg  and was taking aspirin 81mg  daily. He was discharged on aspirin 325mg .  No prior personal or family history of stroke or TIA.    - Follow-up 05/04/2013:  At his last visit, I switched him to plavix.  48-hr holter testing did not show any arrhythmias.   He is tolerating it well and denies any new neurological symptoms. He is trying to cut back on smoking cigars.    Medications:  Current Outpatient Prescriptions on File Prior to Visit  Medication Sig Dispense Refill  . amphetamine-dextroamphetamine (ADDERALL) 20 MG tablet Take 20 mg by mouth 2 (two) times daily.      Marland Kitchen aspirin EC 325 MG EC tablet Take 1 tablet (325 mg total) by mouth daily.  30 tablet  0  . atorvastatin (LIPITOR) 10 MG tablet Take 10 mg by mouth daily.      . clopidogrel (PLAVIX) 75 MG tablet Take 1 tablet (75 mg total) by mouth daily with breakfast.  30 tablet  11  . Multiple Vitamin (MULTIVITAMIN WITH MINERALS) TABS tablet Take 1 tablet by mouth daily.       No current facility-administered medications on file prior to visit.    Allergies: No Known  Allergies   Review of Systems:  CONSTITUTIONAL: No fevers, chills, night sweats, or weight loss.   EYES: No visual changes or eye pain ENT: No hearing changes.  No history of nose bleeds.   RESPIRATORY: No cough, wheezing and shortness of breath.   CARDIOVASCULAR: Negative for chest pain, and palpitations.   GI: Negative for abdominal discomfort, blood in stools or black stools.  No recent change in bowel habits.   GU:  No history of incontinence.   MUSCLOSKELETAL: No history of joint pain or swelling.  No myalgias.   SKIN: Negative for lesions, rash, and itching.   ENDOCRINE: Negative for cold or heat intolerance, polydipsia or goiter.   PSYCH:  No depression or anxiety symptoms.   NEURO: As Above.   Vital Signs:  BP 148/74  Pulse 94  Wt 191 lb 3 oz (86.722 kg)  SpO2 94%   Neurological Exam: MENTAL STATUS including orientation to time, place, person, recent and remote memory, attention span and concentration, language, and fund of knowledge is normal.  Speech is not dysarthric, but there is intermittent stuttering of speech (old).  CRANIAL NERVES: Pupils equal round and reactive to light.  Normal conjugate, extra-ocular eye movements in all directions of gaze.  No ptosis. Normal facial sensation.  Face is symmetric. Palate elevates symmetrically.  Tongue is midline.  MOTOR:  Motor strength is 5/5 in all  extremities, except mild intrinsic hand weakness bilaterally.  No pronator drift.  Tone is normal.  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  SENSORY:  Intact to light touch and vibration throughout.  COORDINATION/GAIT:  Normal finger-to- nose-finger and heel-to-shin.  Intact rapid alternating movements bilaterally.  Gait narrow based and stable.   Data: US carotids 12/30/2012: Findings suggest 1-39% internal carotid artery stenosis bilaterally. Vertebral arteries are patent with  antegrade flow.   Echo 12/30/2012:  - Left ventricle: The cavity size was normal. Systolic function was normal. The estimated ejection fraction was in the range of 60% to 65%. Wall motion was normal; there were no regional wall motion abnormalities. - Mitral valve: Calcified annulus. Mildly thickened leaflets . Mild posterior leafletprolapse.   MRI/A brain 12/29/2012:  1. Ventricular prominence is disproportionate to the degree of atrophy, suggesting normal pressure hydrocephalus.  2. No other acute intracranial abnormality. Specifically, there is no evidence for acute or subacute infarct.  3. Mild distal small vessel disease is evident on the MRA without significant proximal stenosis, aneurysm, or branch vessel occlusion.   48-hr holter:  NSR, rare PVCs  Lab Results  Component Value Date   HGBA1C 5.8* 12/30/2012   Lab Results  Component Value Date   CHOL 190 12/30/2012   HDL 32* 12/30/2012   LDLCALC 102* 12/30/2012   TRIG 281* 12/30/2012   CHOLHDL 5.9 12/30/2012     IMPRESSION/PLAN: Mr. Fawcett is a 73 year-old gentleman returning for evaluation of TIA while on aspirin 81 manifesting with left facial droop on 12/29/2012.  His work-up including vessel imaging, echocardiogram, and holter monitor (48 hr) did not reveal any etiology for his TIA. Clinically, he is doing well.   I recommend that he continue plavix 75mg  and lipitor 10mg  for secondary stroke prevention. Blood pressure is on the upper end of normal (148/74), I have asked him to continue to follow and if it remains elevated, discuss with his primary care doctor.    Encouraged to follow low carb, low salt diet.  Furthermore, I stressed the importance of secondary stroke risk prevention including tobacco cessation, management of hyperlipidemia, hypertension, and staying active.    The duration of this appointment visit was 25 minutes of face-to-face time with the patient.  Greater than 50% of this time was spent in  counseling, explanation of diagnosis, planning of further management, and coordination of care.   Thank you for allowing me to participate in patient's care.  If I can answer any additional questions, I would be pleased to do so.    Sincerely,    Donika K. Posey Pronto, DO

## 2013-05-06 DIAGNOSIS — H04129 Dry eye syndrome of unspecified lacrimal gland: Secondary | ICD-10-CM | POA: Diagnosis not present

## 2013-05-06 DIAGNOSIS — H2510 Age-related nuclear cataract, unspecified eye: Secondary | ICD-10-CM | POA: Diagnosis not present

## 2013-05-06 DIAGNOSIS — H40019 Open angle with borderline findings, low risk, unspecified eye: Secondary | ICD-10-CM | POA: Diagnosis not present

## 2013-06-10 DIAGNOSIS — F909 Attention-deficit hyperactivity disorder, unspecified type: Secondary | ICD-10-CM | POA: Diagnosis not present

## 2013-08-04 ENCOUNTER — Ambulatory Visit (INDEPENDENT_AMBULATORY_CARE_PROVIDER_SITE_OTHER): Payer: Medicare Other | Admitting: Neurology

## 2013-08-04 ENCOUNTER — Encounter: Payer: Self-pay | Admitting: Neurology

## 2013-08-04 VITALS — BP 138/80 | HR 86 | Ht 73.23 in | Wt 186.2 lb

## 2013-08-04 DIAGNOSIS — G459 Transient cerebral ischemic attack, unspecified: Secondary | ICD-10-CM | POA: Diagnosis not present

## 2013-08-04 NOTE — Progress Notes (Signed)
Follow-up Visit   Date: 08/04/2013    Dylan Moreno MRN: [id number] DOB: 1940/08/08   Interim History: Dylan Moreno is a 73 y.o. right-handed Caucasian male with history of hyperlipidemia, tobacco use, ADD, recent TIA (12/29/2012) returning to the clinic for follow-up of TIA.  The patient was accompanied to the clinic by self.  History of present illness: He presented to the Emergency Department on 12/29/2012 with acute onset of left facial droop, lasting one hour. He woke up that morning feeling dizzy and lightheaded. His son, who is a Agricultural consultant, noticed his facial droop and brought him to the ED for evaluation. He CT of the brain, MRI/A of the brain which did not show any acute stroke. There was a note of ventriculomegaly suggestive of NPH. US carotids showed patent ICAs and echocardiogram did not reveal an embolic source. He was previously taking lipitor 10mg  and was taking aspirin 81mg  daily. He was discharged on aspirin 325mg .  No prior personal or family history of stroke or TIA.   - Follow-up 05/04/2013:  At his last visit, I switched him to plavix.  48-hr holter testing did not show any arrhythmias.   He is tolerating it well and denies any new neurological symptoms. He is trying to cut back on smoking cigars.   UPDATE 08/04/2013:  He is doing well and has no new neurological symptoms.     Medications:  Current Outpatient Prescriptions on File Prior to Visit  Medication Sig Dispense Refill  . amphetamine-dextroamphetamine (ADDERALL) 20 MG tablet Take 20 mg by mouth 2 (two) times daily.      Marland Kitchen atorvastatin (LIPITOR) 10 MG tablet Take 10 mg by mouth daily.      . clopidogrel (PLAVIX) 75 MG tablet Take 1 tablet (75 mg total) by mouth daily with breakfast.  30 tablet  11  . Multiple Vitamin (MULTIVITAMIN WITH MINERALS) TABS tablet Take 1 tablet by mouth daily.       No current facility-administered medications on file prior to visit.    Allergies: No Known Allergies   Review  of Systems:  CONSTITUTIONAL: No fevers, chills, night sweats, or weight loss.   EYES: No visual changes or eye pain ENT: No hearing changes.  No history of nose bleeds.   RESPIRATORY: No cough, wheezing and shortness of breath.   CARDIOVASCULAR: Negative for chest pain, and palpitations.   GI: Negative for abdominal discomfort, blood in stools or black stools.  No recent change in bowel habits.   GU:  No history of incontinence.   MUSCLOSKELETAL: No history of joint pain or swelling.  No myalgias.   SKIN: Negative for lesions, rash, and itching.   ENDOCRINE: Negative for cold or heat intolerance, polydipsia or goiter.   PSYCH:  No depression or anxiety symptoms.   NEURO: As Above.   Vital Signs:  BP 138/80  Pulse 86  Ht 6' 1.23" (1.86 m)  Wt 186 lb 4 oz (84.482 kg)  BMI 24.42 kg/m2  SpO2 96%  Neurological Exam: MENTAL STATUS including orientation to time, place, person, recent and remote memory, attention span and concentration, language, and fund of knowledge is normal.  Speech is not dysarthric, but there is intermittent stuttering of speech (old).  CRANIAL NERVES: Pupils equal round and reactive to light.  Normal conjugate, extra-ocular eye movements in all directions of gaze.  No ptosis. Normal facial sensation.  Face is symmetric. Palate elevates symmetrically.  Tongue is midline.  MOTOR:  Motor strength is 5/5 in  all extremities, except mild intrinsic hand weakness bilaterally.  No pronator drift. 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  SENSORY:  Intact to light touch and vibration throughout.  COORDINATION/GAIT:  Normal finger-to- nose-finger and heel-to-shin. Gait narrow based and stable.   Data: US carotids 12/30/2012: Findings suggest 1-39% internal carotid artery stenosis bilaterally. Vertebral arteries are patent with antegrade flow.   Echo 12/30/2012:  - Left ventricle:  The cavity size was normal. Systolic function was normal. The estimated ejection fraction was in the range of 60% to 65%. Wall motion was normal; there were no regional wall motion abnormalities. - Mitral valve: Calcified annulus. Mildly thickened leaflets . Mild posterior leafletprolapse.   MRI/A brain 12/29/2012:  1. Ventricular prominence is disproportionate to the degree of atrophy, suggesting normal pressure hydrocephalus.  2. No other acute intracranial abnormality. Specifically, there is no evidence for acute or subacute infarct.  3. Mild distal small vessel disease is evident on the MRA without significant proximal stenosis, aneurysm, or branch vessel occlusion.   48-hr holter:  NSR, rare PVCs  Lab Results  Component Value Date   HGBA1C 5.8* 12/30/2012   Lab Results  Component Value Date   CHOL 190 12/30/2012   HDL 32* 12/30/2012   LDLCALC 102* 12/30/2012   TRIG 281* 12/30/2012   CHOLHDL 5.9 12/30/2012     IMPRESSION: Dylan Moreno is a 73 year-old gentleman returning for evaluation of TIA while on aspirin 81 manifesting with left facial droop on 12/29/2012.  His work-up including vessel imaging, echocardiogram, and holter monitor (48 hr) did not reveal any etiology for his TIA. Clinically, he is doing well.   He was switched to plavix 75mg  and started on lipitor 10mg  for secondary stroke prevention. Blood pressure is fairly well-controlled off medications.  Clinically, he is doing great without any new complaints or focal deficits on exam.  PLAN: 1.  Continue Plavix 75mg  daily 2.  Continue Lipitor 10mg  daily 3.  Encouraged to stay active, follow low salt/carb diet 4.  Return to clinic in 1-year, or sooner needed   The duration of this appointment visit was 15 minutes of face-to-face time with the patient.  Greater than 50% of this time was spent in counseling, explanation of diagnosis, planning of further management, and coordination of care.   Thank you for allowing me  to participate in patient's care.  If I can answer any additional questions, I would be pleased to do so.    Sincerely,    Stclair Szymborski K. Posey Pronto, DO

## 2013-08-04 NOTE — Patient Instructions (Addendum)
1.  Continue Plavix 75mg  daily 2.  Continue Lipitor 10mg  daily 3.  Encouraged to stay active, follow low salt/carb diet 4.  Return to clinic in 1-year, or sooner needed

## 2013-09-04 DIAGNOSIS — M720 Palmar fascial fibromatosis [Dupuytren]: Secondary | ICD-10-CM | POA: Diagnosis not present

## 2013-09-08 DIAGNOSIS — E782 Mixed hyperlipidemia: Secondary | ICD-10-CM | POA: Diagnosis not present

## 2013-09-08 DIAGNOSIS — G459 Transient cerebral ischemic attack, unspecified: Secondary | ICD-10-CM | POA: Diagnosis not present

## 2013-09-08 DIAGNOSIS — F909 Attention-deficit hyperactivity disorder, unspecified type: Secondary | ICD-10-CM | POA: Diagnosis not present

## 2013-09-08 DIAGNOSIS — I10 Essential (primary) hypertension: Secondary | ICD-10-CM | POA: Diagnosis not present

## 2013-09-08 DIAGNOSIS — F17200 Nicotine dependence, unspecified, uncomplicated: Secondary | ICD-10-CM | POA: Diagnosis not present

## 2013-09-08 DIAGNOSIS — Z23 Encounter for immunization: Secondary | ICD-10-CM | POA: Diagnosis not present

## 2013-11-10 DIAGNOSIS — M9901 Segmental and somatic dysfunction of cervical region: Secondary | ICD-10-CM | POA: Diagnosis not present

## 2013-11-10 DIAGNOSIS — M9902 Segmental and somatic dysfunction of thoracic region: Secondary | ICD-10-CM | POA: Diagnosis not present

## 2013-11-10 DIAGNOSIS — M5030 Other cervical disc degeneration, unspecified cervical region: Secondary | ICD-10-CM | POA: Diagnosis not present

## 2013-11-10 DIAGNOSIS — M5135 Other intervertebral disc degeneration, thoracolumbar region: Secondary | ICD-10-CM | POA: Diagnosis not present

## 2013-11-11 DIAGNOSIS — M5135 Other intervertebral disc degeneration, thoracolumbar region: Secondary | ICD-10-CM | POA: Diagnosis not present

## 2013-11-11 DIAGNOSIS — M5030 Other cervical disc degeneration, unspecified cervical region: Secondary | ICD-10-CM | POA: Diagnosis not present

## 2013-11-11 DIAGNOSIS — M9901 Segmental and somatic dysfunction of cervical region: Secondary | ICD-10-CM | POA: Diagnosis not present

## 2013-11-11 DIAGNOSIS — M9902 Segmental and somatic dysfunction of thoracic region: Secondary | ICD-10-CM | POA: Diagnosis not present

## 2013-11-12 DIAGNOSIS — M5030 Other cervical disc degeneration, unspecified cervical region: Secondary | ICD-10-CM | POA: Diagnosis not present

## 2013-11-12 DIAGNOSIS — M9901 Segmental and somatic dysfunction of cervical region: Secondary | ICD-10-CM | POA: Diagnosis not present

## 2013-11-12 DIAGNOSIS — M5135 Other intervertebral disc degeneration, thoracolumbar region: Secondary | ICD-10-CM | POA: Diagnosis not present

## 2013-11-12 DIAGNOSIS — M9902 Segmental and somatic dysfunction of thoracic region: Secondary | ICD-10-CM | POA: Diagnosis not present

## 2013-11-16 DIAGNOSIS — M5135 Other intervertebral disc degeneration, thoracolumbar region: Secondary | ICD-10-CM | POA: Diagnosis not present

## 2013-11-16 DIAGNOSIS — M9902 Segmental and somatic dysfunction of thoracic region: Secondary | ICD-10-CM | POA: Diagnosis not present

## 2013-11-16 DIAGNOSIS — M5030 Other cervical disc degeneration, unspecified cervical region: Secondary | ICD-10-CM | POA: Diagnosis not present

## 2013-11-16 DIAGNOSIS — M9901 Segmental and somatic dysfunction of cervical region: Secondary | ICD-10-CM | POA: Diagnosis not present

## 2013-11-17 DIAGNOSIS — M5135 Other intervertebral disc degeneration, thoracolumbar region: Secondary | ICD-10-CM | POA: Diagnosis not present

## 2013-11-17 DIAGNOSIS — M5030 Other cervical disc degeneration, unspecified cervical region: Secondary | ICD-10-CM | POA: Diagnosis not present

## 2013-11-17 DIAGNOSIS — M9902 Segmental and somatic dysfunction of thoracic region: Secondary | ICD-10-CM | POA: Diagnosis not present

## 2013-11-17 DIAGNOSIS — M9901 Segmental and somatic dysfunction of cervical region: Secondary | ICD-10-CM | POA: Diagnosis not present

## 2013-11-19 DIAGNOSIS — M5030 Other cervical disc degeneration, unspecified cervical region: Secondary | ICD-10-CM | POA: Diagnosis not present

## 2013-11-19 DIAGNOSIS — M5135 Other intervertebral disc degeneration, thoracolumbar region: Secondary | ICD-10-CM | POA: Diagnosis not present

## 2013-11-19 DIAGNOSIS — M9901 Segmental and somatic dysfunction of cervical region: Secondary | ICD-10-CM | POA: Diagnosis not present

## 2013-11-19 DIAGNOSIS — M9902 Segmental and somatic dysfunction of thoracic region: Secondary | ICD-10-CM | POA: Diagnosis not present

## 2013-11-23 DIAGNOSIS — M9901 Segmental and somatic dysfunction of cervical region: Secondary | ICD-10-CM | POA: Diagnosis not present

## 2013-11-23 DIAGNOSIS — M9902 Segmental and somatic dysfunction of thoracic region: Secondary | ICD-10-CM | POA: Diagnosis not present

## 2013-11-23 DIAGNOSIS — M5030 Other cervical disc degeneration, unspecified cervical region: Secondary | ICD-10-CM | POA: Diagnosis not present

## 2013-11-23 DIAGNOSIS — M5135 Other intervertebral disc degeneration, thoracolumbar region: Secondary | ICD-10-CM | POA: Diagnosis not present

## 2013-11-24 DIAGNOSIS — M5135 Other intervertebral disc degeneration, thoracolumbar region: Secondary | ICD-10-CM | POA: Diagnosis not present

## 2013-11-24 DIAGNOSIS — M9902 Segmental and somatic dysfunction of thoracic region: Secondary | ICD-10-CM | POA: Diagnosis not present

## 2013-11-24 DIAGNOSIS — M5030 Other cervical disc degeneration, unspecified cervical region: Secondary | ICD-10-CM | POA: Diagnosis not present

## 2013-11-24 DIAGNOSIS — M9901 Segmental and somatic dysfunction of cervical region: Secondary | ICD-10-CM | POA: Diagnosis not present

## 2013-11-30 DIAGNOSIS — M5135 Other intervertebral disc degeneration, thoracolumbar region: Secondary | ICD-10-CM | POA: Diagnosis not present

## 2013-11-30 DIAGNOSIS — M9902 Segmental and somatic dysfunction of thoracic region: Secondary | ICD-10-CM | POA: Diagnosis not present

## 2013-11-30 DIAGNOSIS — M9901 Segmental and somatic dysfunction of cervical region: Secondary | ICD-10-CM | POA: Diagnosis not present

## 2013-11-30 DIAGNOSIS — M5030 Other cervical disc degeneration, unspecified cervical region: Secondary | ICD-10-CM | POA: Diagnosis not present

## 2013-12-01 DIAGNOSIS — M5030 Other cervical disc degeneration, unspecified cervical region: Secondary | ICD-10-CM | POA: Diagnosis not present

## 2013-12-01 DIAGNOSIS — M9902 Segmental and somatic dysfunction of thoracic region: Secondary | ICD-10-CM | POA: Diagnosis not present

## 2013-12-01 DIAGNOSIS — M5135 Other intervertebral disc degeneration, thoracolumbar region: Secondary | ICD-10-CM | POA: Diagnosis not present

## 2013-12-01 DIAGNOSIS — M9901 Segmental and somatic dysfunction of cervical region: Secondary | ICD-10-CM | POA: Diagnosis not present

## 2013-12-03 DIAGNOSIS — M5135 Other intervertebral disc degeneration, thoracolumbar region: Secondary | ICD-10-CM | POA: Diagnosis not present

## 2013-12-03 DIAGNOSIS — M9902 Segmental and somatic dysfunction of thoracic region: Secondary | ICD-10-CM | POA: Diagnosis not present

## 2013-12-03 DIAGNOSIS — M9901 Segmental and somatic dysfunction of cervical region: Secondary | ICD-10-CM | POA: Diagnosis not present

## 2013-12-03 DIAGNOSIS — M5030 Other cervical disc degeneration, unspecified cervical region: Secondary | ICD-10-CM | POA: Diagnosis not present

## 2013-12-07 DIAGNOSIS — M5135 Other intervertebral disc degeneration, thoracolumbar region: Secondary | ICD-10-CM | POA: Diagnosis not present

## 2013-12-07 DIAGNOSIS — M9901 Segmental and somatic dysfunction of cervical region: Secondary | ICD-10-CM | POA: Diagnosis not present

## 2013-12-07 DIAGNOSIS — M9902 Segmental and somatic dysfunction of thoracic region: Secondary | ICD-10-CM | POA: Diagnosis not present

## 2013-12-07 DIAGNOSIS — M5030 Other cervical disc degeneration, unspecified cervical region: Secondary | ICD-10-CM | POA: Diagnosis not present

## 2013-12-09 DIAGNOSIS — M9901 Segmental and somatic dysfunction of cervical region: Secondary | ICD-10-CM | POA: Diagnosis not present

## 2013-12-09 DIAGNOSIS — M5135 Other intervertebral disc degeneration, thoracolumbar region: Secondary | ICD-10-CM | POA: Diagnosis not present

## 2013-12-09 DIAGNOSIS — M5030 Other cervical disc degeneration, unspecified cervical region: Secondary | ICD-10-CM | POA: Diagnosis not present

## 2013-12-09 DIAGNOSIS — M9902 Segmental and somatic dysfunction of thoracic region: Secondary | ICD-10-CM | POA: Diagnosis not present

## 2013-12-14 DIAGNOSIS — M5030 Other cervical disc degeneration, unspecified cervical region: Secondary | ICD-10-CM | POA: Diagnosis not present

## 2013-12-14 DIAGNOSIS — M5135 Other intervertebral disc degeneration, thoracolumbar region: Secondary | ICD-10-CM | POA: Diagnosis not present

## 2013-12-14 DIAGNOSIS — M9902 Segmental and somatic dysfunction of thoracic region: Secondary | ICD-10-CM | POA: Diagnosis not present

## 2013-12-14 DIAGNOSIS — M9901 Segmental and somatic dysfunction of cervical region: Secondary | ICD-10-CM | POA: Diagnosis not present

## 2013-12-16 DIAGNOSIS — M5030 Other cervical disc degeneration, unspecified cervical region: Secondary | ICD-10-CM | POA: Diagnosis not present

## 2013-12-16 DIAGNOSIS — M9901 Segmental and somatic dysfunction of cervical region: Secondary | ICD-10-CM | POA: Diagnosis not present

## 2013-12-16 DIAGNOSIS — M9902 Segmental and somatic dysfunction of thoracic region: Secondary | ICD-10-CM | POA: Diagnosis not present

## 2013-12-16 DIAGNOSIS — M5135 Other intervertebral disc degeneration, thoracolumbar region: Secondary | ICD-10-CM | POA: Diagnosis not present

## 2013-12-21 DIAGNOSIS — M5135 Other intervertebral disc degeneration, thoracolumbar region: Secondary | ICD-10-CM | POA: Diagnosis not present

## 2013-12-21 DIAGNOSIS — M9902 Segmental and somatic dysfunction of thoracic region: Secondary | ICD-10-CM | POA: Diagnosis not present

## 2013-12-21 DIAGNOSIS — M5030 Other cervical disc degeneration, unspecified cervical region: Secondary | ICD-10-CM | POA: Diagnosis not present

## 2013-12-21 DIAGNOSIS — M9901 Segmental and somatic dysfunction of cervical region: Secondary | ICD-10-CM | POA: Diagnosis not present

## 2014-01-04 DIAGNOSIS — M9901 Segmental and somatic dysfunction of cervical region: Secondary | ICD-10-CM | POA: Diagnosis not present

## 2014-01-04 DIAGNOSIS — M9902 Segmental and somatic dysfunction of thoracic region: Secondary | ICD-10-CM | POA: Diagnosis not present

## 2014-01-04 DIAGNOSIS — M5030 Other cervical disc degeneration, unspecified cervical region: Secondary | ICD-10-CM | POA: Diagnosis not present

## 2014-01-04 DIAGNOSIS — M5135 Other intervertebral disc degeneration, thoracolumbar region: Secondary | ICD-10-CM | POA: Diagnosis not present

## 2014-01-07 DIAGNOSIS — M9902 Segmental and somatic dysfunction of thoracic region: Secondary | ICD-10-CM | POA: Diagnosis not present

## 2014-01-07 DIAGNOSIS — M5030 Other cervical disc degeneration, unspecified cervical region: Secondary | ICD-10-CM | POA: Diagnosis not present

## 2014-01-07 DIAGNOSIS — M9901 Segmental and somatic dysfunction of cervical region: Secondary | ICD-10-CM | POA: Diagnosis not present

## 2014-01-07 DIAGNOSIS — M5135 Other intervertebral disc degeneration, thoracolumbar region: Secondary | ICD-10-CM | POA: Diagnosis not present

## 2014-01-18 DIAGNOSIS — M9902 Segmental and somatic dysfunction of thoracic region: Secondary | ICD-10-CM | POA: Diagnosis not present

## 2014-01-18 DIAGNOSIS — M5135 Other intervertebral disc degeneration, thoracolumbar region: Secondary | ICD-10-CM | POA: Diagnosis not present

## 2014-01-18 DIAGNOSIS — M5030 Other cervical disc degeneration, unspecified cervical region: Secondary | ICD-10-CM | POA: Diagnosis not present

## 2014-01-18 DIAGNOSIS — M9901 Segmental and somatic dysfunction of cervical region: Secondary | ICD-10-CM | POA: Diagnosis not present

## 2014-03-02 ENCOUNTER — Other Ambulatory Visit: Payer: Self-pay | Admitting: Nurse Practitioner

## 2014-03-02 ENCOUNTER — Ambulatory Visit
Admission: RE | Admit: 2014-03-02 | Discharge: 2014-03-02 | Disposition: A | Discharge status: Home / Self Care | Payer: Medicare Other | Source: Ambulatory Visit | Attending: Nurse Practitioner | Admitting: Nurse Practitioner

## 2014-03-02 ENCOUNTER — Other Ambulatory Visit: Payer: Self-pay | Admitting: Internal Medicine

## 2014-03-02 DIAGNOSIS — R221 Localized swelling, mass and lump, neck: Secondary | ICD-10-CM

## 2014-03-02 DIAGNOSIS — J449 Chronic obstructive pulmonary disease, unspecified: Secondary | ICD-10-CM | POA: Diagnosis not present

## 2014-03-03 DIAGNOSIS — R221 Localized swelling, mass and lump, neck: Secondary | ICD-10-CM | POA: Diagnosis not present

## 2014-03-03 DIAGNOSIS — F909 Attention-deficit hyperactivity disorder, unspecified type: Secondary | ICD-10-CM | POA: Diagnosis not present

## 2014-03-03 DIAGNOSIS — R413 Other amnesia: Secondary | ICD-10-CM | POA: Diagnosis not present

## 2014-03-04 ENCOUNTER — Ambulatory Visit
Admission: RE | Admit: 2014-03-04 | Discharge: 2014-03-04 | Disposition: A | Discharge status: Home / Self Care | Payer: Medicare Other | Source: Ambulatory Visit | Attending: Internal Medicine | Admitting: Internal Medicine

## 2014-03-04 DIAGNOSIS — R221 Localized swelling, mass and lump, neck: Secondary | ICD-10-CM

## 2014-03-05 ENCOUNTER — Other Ambulatory Visit (HOSPITAL_COMMUNITY)
Admission: RE | Admit: 2014-03-05 | Discharge: 2014-03-05 | Disposition: A | Discharge status: Home / Self Care | Payer: Medicare Other | Source: Ambulatory Visit | Attending: Otolaryngology | Admitting: Otolaryngology

## 2014-03-05 ENCOUNTER — Other Ambulatory Visit: Payer: Self-pay | Admitting: Otolaryngology

## 2014-03-05 DIAGNOSIS — R590 Localized enlarged lymph nodes: Secondary | ICD-10-CM | POA: Diagnosis not present

## 2014-03-05 DIAGNOSIS — C760 Malignant neoplasm of head, face and neck: Secondary | ICD-10-CM | POA: Diagnosis not present

## 2014-03-05 DIAGNOSIS — R221 Localized swelling, mass and lump, neck: Secondary | ICD-10-CM | POA: Insufficient documentation

## 2014-03-05 DIAGNOSIS — H6121 Impacted cerumen, right ear: Secondary | ICD-10-CM | POA: Diagnosis not present

## 2014-03-05 DIAGNOSIS — C01 Malignant neoplasm of base of tongue: Secondary | ICD-10-CM

## 2014-03-05 DIAGNOSIS — D3702 Neoplasm of uncertain behavior of tongue: Secondary | ICD-10-CM | POA: Diagnosis not present

## 2014-03-05 HISTORY — DX: Malignant neoplasm of base of tongue: C01

## 2014-03-09 DIAGNOSIS — R221 Localized swelling, mass and lump, neck: Secondary | ICD-10-CM | POA: Diagnosis not present

## 2014-03-09 DIAGNOSIS — R413 Other amnesia: Secondary | ICD-10-CM | POA: Diagnosis not present

## 2014-03-09 DIAGNOSIS — I10 Essential (primary) hypertension: Secondary | ICD-10-CM | POA: Diagnosis not present

## 2014-03-09 DIAGNOSIS — E782 Mixed hyperlipidemia: Secondary | ICD-10-CM | POA: Diagnosis not present

## 2014-03-09 DIAGNOSIS — Z1389 Encounter for screening for other disorder: Secondary | ICD-10-CM | POA: Diagnosis not present

## 2014-03-09 DIAGNOSIS — Z125 Encounter for screening for malignant neoplasm of prostate: Secondary | ICD-10-CM | POA: Diagnosis not present

## 2014-03-09 DIAGNOSIS — F909 Attention-deficit hyperactivity disorder, unspecified type: Secondary | ICD-10-CM | POA: Diagnosis not present

## 2014-03-09 DIAGNOSIS — Z0001 Encounter for general adult medical examination with abnormal findings: Secondary | ICD-10-CM | POA: Diagnosis not present

## 2014-03-09 DIAGNOSIS — E039 Hypothyroidism, unspecified: Secondary | ICD-10-CM | POA: Diagnosis not present

## 2014-03-09 DIAGNOSIS — G459 Transient cerebral ischemic attack, unspecified: Secondary | ICD-10-CM | POA: Diagnosis not present

## 2014-03-12 ENCOUNTER — Other Ambulatory Visit (HOSPITAL_COMMUNITY): Payer: Self-pay | Admitting: Otolaryngology

## 2014-03-12 DIAGNOSIS — C029 Malignant neoplasm of tongue, unspecified: Secondary | ICD-10-CM

## 2014-03-16 ENCOUNTER — Encounter: Payer: Self-pay | Admitting: Radiation Oncology

## 2014-03-16 ENCOUNTER — Telehealth: Payer: Self-pay | Admitting: *Deleted

## 2014-03-16 DIAGNOSIS — E039 Hypothyroidism, unspecified: Secondary | ICD-10-CM | POA: Diagnosis not present

## 2014-03-16 DIAGNOSIS — Z0001 Encounter for general adult medical examination with abnormal findings: Secondary | ICD-10-CM | POA: Diagnosis not present

## 2014-03-16 NOTE — Telephone Encounter (Signed)
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 Also spoke with patient's wife earlier, she indicated that three people will be accompanying patient tomorrow.  Gayleen Orem, RN, BSN, [facility] [phone]

## 2014-03-16 NOTE — Progress Notes (Signed)
Head and Neck Cancer Location of Tumor / Histology:  Base of tongue, 2 cm mass, left of midline  Patient presented < 1 month ago with symptoms of: left neck mass x 5 days  Biopsies of  (if applicable) revealed:  02/07/31  Diagnosis NECK, FINE NEEDLE ASPIRATION LEFT (SPECIMEN 1 OF 1, COLLECTED ON 03/05/2014): MALIGNANT CELLS CONSISTENT WITH SQUAMOUS CELL CARCINOMA. ADDITIONAL INFORMATION: Immunohistochemistry is performed for p16 and the tumor cells show weak to moderate positivity.  Nutrition Status Yes No Comments  Weight changes? []  [x]    Swallowing concerns? []  [x]    PEG? []  [x]     Referrals Yes No Comments  Social Work? []  []  [facility]  Dentistry? []  []  Sees dentist regularly  Swallowing therapy? []  []  [facility]  Nutrition? []  []  [facility]  Med/Onc? []  []  [facility]   Safety Issues Yes No Comments  Prior radiation? []  [x]    Pacemaker/ICD? []  [x]    Possible current pregnancy? []  [x]    Is the patient on methotrexate? 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

## 2014-03-17 ENCOUNTER — Ambulatory Visit: Payer: Medicare Other | Admitting: Nutrition

## 2014-03-17 ENCOUNTER — Encounter: Payer: Self-pay | Admitting: *Deleted

## 2014-03-17 ENCOUNTER — Encounter: Payer: Medicare Other | Admitting: Nutrition

## 2014-03-17 ENCOUNTER — Encounter: Payer: Self-pay | Admitting: Hematology and Oncology

## 2014-03-17 ENCOUNTER — Ambulatory Visit
Admission: RE | Admit: 2014-03-17 | Discharge: 2014-03-17 | Disposition: A | Discharge status: Home / Self Care | Payer: Medicare Other | Source: Ambulatory Visit | Attending: Radiation Oncology | Admitting: Radiation Oncology

## 2014-03-17 ENCOUNTER — Ambulatory Visit: Payer: Medicare Other | Attending: Radiation Oncology | Admitting: Physical Therapy

## 2014-03-17 ENCOUNTER — Encounter: Payer: Self-pay | Admitting: Radiation Oncology

## 2014-03-17 ENCOUNTER — Ambulatory Visit (HOSPITAL_BASED_OUTPATIENT_CLINIC_OR_DEPARTMENT_OTHER): Payer: Medicare Other | Admitting: Hematology and Oncology

## 2014-03-17 ENCOUNTER — Ambulatory Visit: Payer: Medicare Other

## 2014-03-17 VITALS — BP 138/88 | HR 74 | Temp 97.5°F | Resp 20 | Ht 73.0 in | Wt 185.0 lb

## 2014-03-17 DIAGNOSIS — E780 Pure hypercholesterolemia: Secondary | ICD-10-CM | POA: Diagnosis not present

## 2014-03-17 DIAGNOSIS — B977 Papillomavirus as the cause of diseases classified elsewhere: Secondary | ICD-10-CM | POA: Diagnosis not present

## 2014-03-17 DIAGNOSIS — R293 Abnormal posture: Secondary | ICD-10-CM | POA: Insufficient documentation

## 2014-03-17 DIAGNOSIS — Z9189 Other specified personal risk factors, not elsewhere classified: Secondary | ICD-10-CM

## 2014-03-17 DIAGNOSIS — H918X9 Other specified hearing loss, unspecified ear: Secondary | ICD-10-CM | POA: Insufficient documentation

## 2014-03-17 DIAGNOSIS — C01 Malignant neoplasm of base of tongue: Secondary | ICD-10-CM | POA: Insufficient documentation

## 2014-03-17 DIAGNOSIS — Z87891 Personal history of nicotine dependence: Secondary | ICD-10-CM | POA: Diagnosis not present

## 2014-03-17 DIAGNOSIS — R1310 Dysphagia, unspecified: Secondary | ICD-10-CM | POA: Insufficient documentation

## 2014-03-17 DIAGNOSIS — Z8673 Personal history of transient ischemic attack (TIA), and cerebral infarction without residual deficits: Secondary | ICD-10-CM | POA: Diagnosis not present

## 2014-03-17 DIAGNOSIS — F1020 Alcohol dependence, uncomplicated: Secondary | ICD-10-CM

## 2014-03-17 DIAGNOSIS — F1099 Alcohol use, unspecified with unspecified alcohol-induced disorder: Secondary | ICD-10-CM | POA: Diagnosis not present

## 2014-03-17 DIAGNOSIS — I10 Essential (primary) hypertension: Secondary | ICD-10-CM | POA: Insufficient documentation

## 2014-03-17 DIAGNOSIS — F909 Attention-deficit hyperactivity disorder, unspecified type: Secondary | ICD-10-CM | POA: Diagnosis not present

## 2014-03-17 DIAGNOSIS — M436 Torticollis: Secondary | ICD-10-CM | POA: Insufficient documentation

## 2014-03-17 DIAGNOSIS — E785 Hyperlipidemia, unspecified: Secondary | ICD-10-CM | POA: Insufficient documentation

## 2014-03-17 DIAGNOSIS — H9190 Unspecified hearing loss, unspecified ear: Secondary | ICD-10-CM

## 2014-03-17 HISTORY — DX: Malignant neoplasm of base of tongue: C01

## 2014-03-17 NOTE — Progress Notes (Signed)
Patient was seen at head and neck clinic.  Patient is a 74 year old male diagnosed with tongue cancer.  He is a patient of Dr. Isidore Moos.  Past medical history includes tobacco, alcohol, hyperlipidemia, ADHD, hypercholesterolemia, TIA, hypertension, anxiety, and GERD.  Medications include Adderall, Lipitor, Plavix, and multivitamin.  Labs include HG A1c 5.8.  Height: 6 feet 2 inches. Weight: 185 pounds March 16. Usual body weight: 190 pounds. BMI: 24.41.  Patient will likely receive concurrent chemoradiation therapy with feeding tube placement. Weight is stable and patient is eating normally. Patient denies nutrition impact symptoms at this time.  Nutrition diagnosis:  Predicted suboptimal energy intake related to diagnosis of tongue cancer and associated treatments as evidenced by a history or condition for which research shows suboptimal energy intake.  Intervention:  Patient and family educated on strategies to increase oral intake to promote weight maintenance. Reviewed importance of high protein foods. Educated patient on strategies for eating if he develops nausea and vomiting.  Encouraged medication compliance. Reviewed strategies for altering textures and tastes of food. Fact sheets were provided.  Questions were answered.  Teach back method used.  Patient has contact information.  Monitoring, evaluation, goals:  Patient will tolerate adequate calories and protein to minimize weight loss.  Tube feedings will be initiated when patient experiences 5% weight loss.  Next visit: To be scheduled weekly with treatment.  **Disclaimer: This note was dictated with voice recognition software. Similar sounding words can inadvertently be transcribed and this note may contain transcription errors which may not have been corrected upon publication of note.**

## 2014-03-17 NOTE — Progress Notes (Signed)
Radiation Oncology         [phone] ________________________________  Initial outpatient Consultation  Name: Dylan Moreno MRN: [id number]  Date: 03/17/2014  DOB: 1940-06-01  FI:EPPIRJ,JOACZY D, MD  Seward Carol, MD   REFERRING PHYSICIAN: Seward Carol, MD  DIAGNOSIS: Stage IVA T2N2cMx (staging incomplete without PET or access to CT images) Base of Tongue Squamous Cell carcinoma   ICD-9-CM ICD-10-CM   1. Malignant neoplasm of base of tongue 141.0 C01     HISTORY OF PRESENT ILLNESS::Dylan Moreno is a 74 y.o. male who presented with a  Left neck mass that appears <1 mo ago with rapid growth.  [facility] performed a CT of his neck on 3-2; I only have the report and not the images at this time. Report is positive for a left base of tongue lesion crossing midline and multiple neck nodes, from levels 2-4 on the left: the largest is 4cm with possible ECE.   The right neck has borderline adenopathy.  Bilateral neck masses were appreciated on ultrasound. Dr Delfina Redwood referred him to Dr. Erik Obey, who biopsied the patient's left neck on 3-4 revealing squamous cell carcinoma.    He quit smoking early this month and still drinks 3 drinks or more daily (ETOH). Motivated to taper off of ETOH.  No prior cancer except multiple skin lesions of the face s/p excision.  No prior RT.  PET pending for tomorrow.  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 Good social support.  No weight loss or swallowing issues or throat pain.  CYTOPATHOLOGY REPORT ADDITIONAL INFORMATION: Immunohistochemistry is performed for p16 and the tumor cells show weak to moderate positivity. (JDP:kh 03-11-14) Claudette Laws MD Pathologist, Electronic Signature ( Signed 03/15/2014) Adequacy Reason Satisfactory For Evaluation. Diagnosis NECK, FINE NEEDLE ASPIRATION LEFT (SPECIMEN 1 OF 1, COLLECTED ON 03/05/2014): MALIGNANT CELLS CONSISTENT WITH SQUAMOUS CELL CARCINOMA. Claudette Laws MD Pathologist,  Electronic Signature (Case signed 03/10/2014)  PREVIOUS RADIATION THERAPY: No  PAST MEDICAL HISTORY:  has a past medical history of Hyperlipemia; Arthritis; ADHD (attention deficit hyperactivity disorder); Wears glasses; Hypercholesterolemia; Peyronie's disease; Kidney stones; TIA (transient ischemic attack); Hypertension; Anxiety; GERD (gastroesophageal reflux disease); Cancer (03/05/14); and TIA (transient ischemic attack) (12/29/12).    PAST SURGICAL HISTORY: Past Surgical History  Procedure Laterality Date  . Dupuytren contracture release  2012    left hand  . Tonsillectomy    . Shoulder arthroscopy w/ rotator cuff repair      left  . Colonoscopy    . Cystoscopy w/ ureteroscopy w/ lithotripsy  2011  . Hand surgery      right  . Dupuytren contracture release Right 10/30/2012    Procedure: DUPUYTREN CONTRACTURE RELEASE RIGHT PALM,RING AND SMALL FINGER;  Surgeon: Cammie Sickle., MD;  Location: [facility];  Service: Orthopedics;  Laterality: Right;    FAMILY HISTORY: family history includes Cancer in his paternal uncle; Healthy in his son; Heart attack in his mother; Hypothyroidism in his father; Melanoma in his brother.  SOCIAL HISTORY:  reports that he quit smoking about 2 weeks ago. His smoking use included Cigarettes and Cigars. He has a 40 pack-year smoking history. He quit smokeless tobacco use about 2 weeks ago. He reports that he drinks alcohol. He reports that he does not use illicit drugs.  ALLERGIES: Review of patient's allergies indicates no known allergies.  MEDICATIONS:  Current Outpatient Prescriptions  Medication Sig Dispense Refill  . amphetamine-dextroamphetamine (ADDERALL) 20 MG tablet Take 20 mg  by mouth 2 (two) times daily.    Marland Kitchen atorvastatin (LIPITOR) 10 MG tablet Take 10 mg by mouth daily.    . clopidogrel (PLAVIX) 75 MG tablet Take 1 tablet (75 mg total) by mouth daily with breakfast. 30 tablet 11  . lisinopril (PRINIVIL,ZESTRIL) 10 MG  tablet Take 10 mg by mouth daily.  3  . Multiple Vitamin (MULTIVITAMIN WITH MINERALS) TABS tablet Take 1 tablet by mouth daily.     No current facility-administered medications for this encounter.    REVIEW OF SYSTEMS:  Notable for that above.   PHYSICAL EXAM:  height is 6' 1"  (1.854 m) and weight is 185 lb (83.915 kg). His oral temperature is 97.5 F (36.4 C). His blood pressure is 138/88 and his pulse is 74. His respiration is 20.   General: Alert and oriented, in no acute distress HEENT: Head is normocephalic. Extraocular movements are intact. Oropharynx is clear, proximally. Neck: 4cm mass at left level 2, smaller mass at left level 3. Questionable swelling in anterior right level 2 region.  Heart: Regular in rate and rhythm with no murmurs, rubs, or gallops. Chest: Clear to auscultation bilaterally, with no rhonchi, wheezes, or rales. Abdomen: Soft, nontender, nondistended, with no rigidity or guarding. Extremities: No cyanosis or edema. Lymphatics: see Neck Exam Skin: No concerning lesions. Musculoskeletal: symmetric strength and muscle tone throughout. Neurologic: Cranial nerves II through XII are grossly intact. No obvious focalities. Speech is fluent. Coordination is intact. Psychiatric: Judgment and insight are intact. Affect is appropriate.  ECOG = 0  0 - Asymptomatic (Fully active, able to carry on all predisease activities without restriction)  1 - Symptomatic but completely ambulatory (Restricted in physically strenuous activity but ambulatory and able to carry out work of a light or sedentary nature. For example, light housework, office work)  2 - Symptomatic, <50% in bed during the day (Ambulatory and capable of all self care but unable to carry out any work activities. Up and about more than 50% of waking hours)  3 - Symptomatic, >50% in bed, but not bedbound (Capable of only limited self-care, confined to bed or chair 50% or more of waking hours)  4 - Bedbound  (Completely disabled. Cannot carry on any self-care. Totally confined to bed or chair)  5 - Death   Eustace Pen MM, Creech RH, Tormey DC, et al. [phone]). "Toxicity and response criteria of the [facility]". [place] Oncol. 5 (6): 649-55   LABORATORY DATA:  Lab Results  Component Value Date   WBC 6.1 12/30/2012   HGB 16.8 12/30/2012   HCT 48.0 12/30/2012   MCV 90.1 12/30/2012   PLT 185 12/30/2012   CMP     Component Value Date/Time   NA 140 12/30/2012 0545   K 4.5 12/30/2012 0545   CL 103 12/30/2012 0545   CO2 22 12/30/2012 0545   GLUCOSE 99 12/30/2012 0545   BUN 20 12/30/2012 0545   CREATININE 0.82 12/30/2012 0545   CALCIUM 9.1 12/30/2012 0545   PROT 7.0 12/30/2012 0545   ALBUMIN 3.7 12/30/2012 0545   AST 27 12/30/2012 0545   ALT 31 12/30/2012 0545   ALKPHOS 86 12/30/2012 0545   BILITOT 0.7 12/30/2012 0545   GFRNONAA 87* 12/30/2012 0545   GFRAA >90 12/30/2012 0545         RADIOGRAPHY: Dg Chest 2 View  03/03/2014   CLINICAL DATA:  Three days of left-sided neck swelling with palpable nodule; history of smoking  EXAM: CHEST  2  VIEW  COMPARISON:  CT scan of the chest dated November 24, 2012  FINDINGS: The lungs are hyperinflated consistent with COPD. There is no alveolar infiltrate. There is no pleural effusion or pneumothorax. The heart and pulmonary vascularity are normal. The mediastinum is normal in width. There is mild tortuosity of the descending thoracic aorta. The bony thorax exhibits no acute abnormality.  IMPRESSION: COPD. There is no active cardiopulmonary disease. No mediastinal or hilar lymphadenopathy is demonstrated.   Electronically Signed   By: David  Martinique   On: 03/03/2014 08:41   US Soft Tissue Head/neck  03/04/2014   CLINICAL DATA:  Left neck soft tissue masses.  EXAM: ULTRASOUND OF HEAD/NECK SOFT TISSUES  TECHNIQUE: Ultrasound examination of the head and neck soft tissues was performed in the area of clinical concern.  COMPARISON:  None.   FINDINGS: The palpable abnormality corresponds to a multiple soft tissue masses with some internal vascularity by color flow Doppler. 3.5 x 2.1 x 3.9 cm solid mass. 1.5 x 1.3 x 1.2 cm solid mass with echogenic foci. 2.6 x 1.5 x 2.0 cm solid mass with echogenic foci. 3.4 x 2.1 x 2.6 cm complex mass with some internal blood flow. 2.1 x 1.5 x 2.0 cm solid mass with internal blood flow. A right-sided neck mass is also identified measuring 2.2 x 1.1 x 2.1 cm.  IMPRESSION: Multiple bilateral neck masses predominately on the left are noted worrisome for metastatic adenopathy. CT soft tissue neck with contrast and ENT examination are recommended.   Electronically Signed   By: Marybelle Killings M.D.   On: 03/04/2014 15:49      IMPRESSION/PLAN:  This is a delightful 74 year old man with stage IVA (ast least) squamous cell carcinoma of the base of tongue; PET is  pending, HPV status weak/moderate, + ETOH abuse /  + smoking history. The patient is a good candidate for radiotherapy. The patient has been discussed in detail at tumor board and seen in the context of multidisciplinary clinic today. Plan is as below:   1) The patient has met with med/onc to discuss chemotherapy - anticipate concurrent ChRT    1a) PET pending for tomorrow. If this revealed distant metastases, it could change our recommendations, as discussed with the patient   2) Referral has been made to dentistry for dental evaluation/extractions in preparation for radiation in the vicinity of the mouth    3) Today in multidisciplinary clinic the patient will see Polo Riley from social work for social support  4) Today in multidisciplinary clinic he will see nutrition for nutrition support -  anticipate PEG tube.   5) Medical Oncology will refer for PEG tube and PAC placement.   6) Will refer to swallowing therapy for evaluation and prophylactic treatment as needed for dysphagia, which can worsen during or after chemoradiotherapy.   7) Physical  therapist will see patient today in [facility] clinic for neck measurements due to risk of lymphedema in neck; may benefit from PT for this after completion of radiotherapy. The patient also may benefit from PT for potentional deconditioning after treatment    8) Simulation once cleared by dentistry.  Anticipate 7 weeks of RT - 70Gy in 35 fractions.   9) Baseline TSH lab due to risk of hypothyroidism from RT - pt reports this was drawn at Dr Lina Sar - we will try to obtain result  10) He quit smoking - applauded patient for this.  He is motivated to taper from ETOH over the next 3 weeks.  I advised him to call us if he has trouble with this .  It was a pleasure meeting the patient today. We discussed the risks, benefits, and side effects of radiotherapy.   We talked in detail about acute and late effects. He understands that some of the most bothersome acute effects will be significant soreness of the mouth and throat, changes in taste, changes in salivary function, skin irritation, hair loss, dehydration, weight loss and fatigue. We talked about late effects which include but are not necessarily limited to dysphagia, hypothyroidism, dry mouth, trismus, neck edema and nerve or spinal cord injury. No guarantees of treatment were given. A consent form was signed and placed in the patient's medical record. The patient is enthusiastic about proceeding with treatment. I look forward to participating in the patient's care.    __________________________________________   Eppie Gibson, MD

## 2014-03-17 NOTE — Therapy (Signed)
[place], [place], [address] Phone: [phone]   Fax:  [phone]  Physical Therapy Evaluation  Patient Details  Name: Dylan Moreno MRN: [id number] Date of Birth: 1940-11-13 Referring Provider:  Seward Carol, MD  Encounter Date: 03/17/2014      PT End of Session - 03/17/14 2052    Visit Number 1   Number of Visits 1   Date for PT Re-Evaluation 05/16/14   PT Start Time 9518   PT Stop Time 1600   PT Time Calculation (min) 30 min   Activity Tolerance Patient tolerated treatment well   Behavior During Therapy [facility] for tasks assessed/performed      Past Medical History  Diagnosis Date  . Hyperlipemia   . Arthritis   . ADHD (attention deficit hyperactivity disorder)   . Wears glasses   . Hypercholesterolemia   . Peyronie's disease   . Kidney stones   . TIA (transient ischemic attack)   . Hypertension   . Anxiety   . GERD (gastroesophageal reflux disease)   . Cancer 03/05/14    squamous cell carcinoma  . TIA (transient ischemic attack) 12/29/12    left facial droop    Past Surgical History  Procedure Laterality Date  . Dupuytren contracture release  2012    left hand  . Tonsillectomy    . Shoulder arthroscopy w/ rotator cuff repair      left  . Colonoscopy    . Cystoscopy w/ ureteroscopy w/ lithotripsy  2011  . Hand surgery      right  . Dupuytren contracture release Right 10/30/2012    Procedure: DUPUYTREN CONTRACTURE RELEASE RIGHT PALM,RING AND SMALL FINGER;  Surgeon: Cammie Sickle., MD;  Location: [facility];  Service: Orthopedics;  Laterality: Right;    There were no vitals filed for this visit.  Visit Diagnosis:  Poor posture - Plan: PT plan of care cert/re-cert  At risk for lymphedema - Plan: PT plan of care cert/re-cert  Stiffness of neck - Plan: PT plan of care cert/re-cert      Subjective Assessment - 03/17/14 2039    Symptoms h/o neck pain  with treatment by chiropractor   Pertinent History Pt. presented recently with left neck mass x 5 days; fine needle aspiration 03/05/14 of base of tongue found malignant cells consistent with squamous cell carcinoma.  Pt. is expected to have concurrent chemoradiation treatment.  Possible h/o TIA or mild stroke with resultant left side weakness, still evident to acquaintances as foot drag on [place]iking at times.                                        Currently in Pain? No/denies            [facility] PT Assessment - 03/17/14 0001    Assessment   Medical Diagnosis base of tongue malignant cells consistent with squamous cell carcinoma   Onset Date 03/05/14   Precautions   Precautions Other (comment)  cancer precautions   Restrictions   Weight Bearing Restrictions No   Balance Screen   Has the patient fallen in the past 6 months No   Has the patient had a decrease in activity level because of a fear of falling?  No   Is the patient reluctant to leave their home because of a fear of falling?  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 reports he walks 4-5 days/week, 45-60 minutes   Functional Tests   Functional tests Sit to Stand   Sit to Stand   Comments 14 times in 30 seconds with difficulty   Posture/Postural Control   Posture/Postural Control Postural limitations   Postural Limitations Forward head;Rounded Shoulders  signficant forward head   ROM / Strength   AROM / PROM / Strength AROM;Strength   AROM   Overall AROM Comments shoulder AROM WFL (slightly limited) bilat.    AROM Assessment Site Cervical    Cervical Flexion WFL   Cervical Extension 50% loss   Cervical - Right Side Bend 75% loss   Cervical - Left Side Bend 75% loss   Cervical - Right Rotation 30% loss   Cervical - Left Rotation 30% loss   Strength   Overall Strength Other (comment)  not formally assessed, but functional   Ambulation/Gait   Ambulation/Gait Yes   Ambulation/Gait Assistance 7: Independent           LYMPHEDEMA/ONCOLOGY QUESTIONNAIRE - 2014/03/19 2050    Lymphedema Assessments   Lymphedema Assessments Head and Neck   Head and Neck   4 cm superior to sternal notch around neck 42.4 cm   6 cm superior to sternal notch around neck 43.3 cm   8 cm superior to sternal notch around neck 44 cm                        PT Education - March 19, 2014 04-02-2049    Education provided Yes   Education Details neck ROM, posture, walking, lymphedema info   Person(s) Educated Patient;Spouse;Child(ren);Other (comment)  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    Status Achieved   Patient will be able to verbalize understanding of proper sitting and standing posture.    Status Achieved   Patient will be able to verbalize understanding of lymphedema risk and availability of treatment for this condition.    Status Achieved           Plan - Mar 19, 2014 04-03-2050    Clinical Impression Statement Patient with significant forward head posture and limited neck ROM may benefit from treatment for these, as well as for lymphedema, should it develop.   Pt will benefit from skilled therapeutic intervention in order to improve on the following deficits Decreased range of motion;Decreased knowledge of precautions;Postural dysfunction   Rehab Potential Good   PT  Frequency One time visit   PT Treatment/Interventions Patient/family education   PT Next Visit Plan None at this time; reassess if limitations progress during/after radiation treatment   PT Home Exercise Plan see education section   Consulted and Agree with Plan of Care Patient;Family member/caregiver          G-Codes - 03/19/2014 03-Apr-2054    Functional Assessment  Tool Used clinical judgement   Functional Limitation Changing and maintaining body position   Changing and Maintaining Body Position Current Status [phone]) At least 40 percent but less than 60 percent impaired, limited or restricted   Changing and Maintaining Body Position Goal Status ([id number]) At least 40 percent but less than 60 percent impaired, limited or restricted       Problem List Patient Active Problem List   Diagnosis Date Noted  . 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  . TIA (transient ischemic attack) 12/29/2012  . Hyperlipemia   . ADHD (attention deficit hyperactivity disorder)     SALIS[place]Y,DONNA 03/17/2014, 8:58 PM  [place], [place], [address] Phone: [phone]   Fax:  [place], PT 03/17/2014 8:58 PM

## 2014-03-18 ENCOUNTER — Other Ambulatory Visit (HOSPITAL_COMMUNITY): Payer: Self-pay | Admitting: Otolaryngology

## 2014-03-18 ENCOUNTER — Telehealth: Payer: Self-pay | Admitting: *Deleted

## 2014-03-18 ENCOUNTER — Encounter (HOSPITAL_COMMUNITY)
Admission: RE | Admit: 2014-03-18 | Discharge: 2014-03-18 | Disposition: A | Discharge status: Home / Self Care | Payer: Medicare Other | Source: Ambulatory Visit | Attending: Otolaryngology | Admitting: Otolaryngology

## 2014-03-18 DIAGNOSIS — H918X9 Other specified hearing loss, unspecified ear: Secondary | ICD-10-CM | POA: Insufficient documentation

## 2014-03-18 DIAGNOSIS — Z79899 Other long term (current) drug therapy: Secondary | ICD-10-CM | POA: Diagnosis not present

## 2014-03-18 DIAGNOSIS — C029 Malignant neoplasm of tongue, unspecified: Secondary | ICD-10-CM

## 2014-03-18 DIAGNOSIS — H9190 Unspecified hearing loss, unspecified ear: Secondary | ICD-10-CM | POA: Insufficient documentation

## 2014-03-18 DIAGNOSIS — C44399 Other specified malignant neoplasm of skin of other parts of face: Secondary | ICD-10-CM | POA: Diagnosis not present

## 2014-03-18 HISTORY — DX: Unspecified hearing loss, unspecified ear: H91.90

## 2014-03-18 LAB — GLUCOSE, CAPILLARY: GLUCOSE-CAPILLARY: 94 mg/dL (ref 70–99)

## 2014-03-18 MED ORDER — FLUDEOXYGLUCOSE F - 18 (FDG) INJECTION
9.3000 | Freq: Once | INTRAVENOUS | Status: AC | PRN
  Administered 2014-03-18: 9.3 via INTRAVENOUS

## 2014-03-18 NOTE — Telephone Encounter (Signed)
Spoke with [facility].  I requested that per Dr. Alvy Bimler and patient's attendance at [facility] yesterday, a baseline hearing test is needed for patient prior to his starting tmt at [facility] [employer] in the next couple of weeks.  She verbalized understanding, will contact patient to schedule.  Gayleen Orem, RN, BSN, [place] [facility] [place] [phone]

## 2014-03-18 NOTE — Assessment & Plan Note (Signed)
The patient had mild hearing loss. It is not significant but according to family member it is. I recommend formal hearing evaluation before we proceed with treatment.

## 2014-03-18 NOTE — Assessment & Plan Note (Signed)
I informed the patient association between alcohol intake and this type of cancer. It is also associated with Dupuytren's contracture. I recommend the patient to slowly wean himself off alcohol intake over the next few weeks.

## 2014-03-18 NOTE — Assessment & Plan Note (Addendum)
We discussed the general approach to this kind of cancer. PET CT scan is pending. If the PET CT scan show limited disease to his neck, the patient will qualify for concurrent chemoradiation therapy. The patient is aware of the possibility of leading to placement and port. I will call him as soon as PET/CT scan result is available. Due to potential hearing deficit, I recommend formal hearing evaluation so that it could help me determine whether the patient is a candidate for cisplatin-based chemotherapy.

## 2014-03-18 NOTE — Progress Notes (Signed)
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 male is here because of newly diagnosed squamous cell carcinoma. According to the patient, the first initial presentation was due to palpable neck lymphadenopathy. he denies any hearing deficit, difficulties with chewing food, swallowing difficulties, painful swallowing, changes in the quality of his voice or abnormal weight loss. According to family members, he has mild hearing deficit. The patient has significant history of smoking and drinking. He has quit smoking since recent diagnosis. I reviewed his records extensively and summarized as follows:   Malignant neoplasm of base of tongue   03/03/2014 Imaging Ct neck elsewhere showed base of tongue mass crossing midline, bilateral LN enlargement, great >4 cm   03/05/2014 Procedure He has FNA biopsy of LN   03/05/2014 Pathology Results NZA [id number] biopsy confirmed squmaous cell carcinoma HPV positive    MEDICAL HISTORY:  Past Medical History  Diagnosis Date  . Hyperlipemia   . Arthritis   . ADHD (attention deficit hyperactivity disorder)   . Wears glasses   . Hypercholesterolemia   . Peyronie's disease   . Kidney stones   . TIA (transient ischemic attack)   . Hypertension   . Anxiety   . GERD (gastroesophageal reflux disease)   . Cancer 03/05/14    squamous cell carcinoma  . TIA (transient ischemic attack) 12/29/12    left facial droop    SURGICAL HISTORY: Past Surgical History  Procedure Laterality Date  . Dupuytren contracture release  2012    left hand  . Tonsillectomy    . Shoulder arthroscopy w/ rotator cuff repair      left  . Colonoscopy    . Cystoscopy w/ ureteroscopy w/ lithotripsy  2011  . Hand surgery       right  . Dupuytren contracture release Right 10/30/2012    Procedure: DUPUYTREN CONTRACTURE RELEASE RIGHT PALM,RING AND SMALL FINGER;  Surgeon: Cammie Sickle., MD;  Location: [facility];  Service: Orthopedics;  Laterality: Right;    SOCIAL HISTORY: History   Social History  . Marital Status: Married    Spouse Name: N/A  . Number of Children: N/A  . Years of Education: N/A   Occupational History  . Not on file.   Social History Main Topics  . Smoking status: Former Smoker -- 1.00 packs/day for 40 years    Types: Cigarettes, Cigars    Quit date: 03/02/2014  . Smokeless tobacco: Former Systems developer    Quit date: 03/02/2014     Comment: smokes occasionally  . Alcohol Use: 0.0 oz/week    0 Standard drinks or equivalent per week     Comment: every night (2-3oz of scotch or gin martini)  . Drug Use: No  . Sexual Activity: Not on file     Comment: mostly quit 2011-smokes occ   Other Topics Concern  . 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   He lives at home and has one grown son.   Highest level of education: 1.5 years of college    FAMILY HISTORY: Family History  Problem Relation Age of Onset  . Heart attack Mother     Deceased, 46  . Hypothyroidism Father  Deceased, 87  . Melanoma Brother     Living, 69  . Healthy Son   . Cancer Paternal Uncle     ?lung ca    ALLERGIES:  has No Known Allergies.  MEDICATIONS:  Current Outpatient Prescriptions  Medication Sig Dispense Refill  . amphetamine-dextroamphetamine (ADDERALL) 20 MG tablet Take 20 mg by mouth 2 (two) times daily.    Marland Kitchen atorvastatin (LIPITOR) 10 MG tablet Take 10 mg by mouth daily.    . clopidogrel (PLAVIX) 75 MG tablet Take 1 tablet (75 mg total) by mouth daily with breakfast. 30 tablet 11  . lisinopril (PRINIVIL,ZESTRIL) 10 MG tablet Take 10 mg by mouth daily.  3  . Multiple Vitamin (MULTIVITAMIN WITH MINERALS) TABS tablet Take 1  tablet by mouth daily.     No current facility-administered medications for this visit.    REVIEW OF SYSTEMS:   Constitutional: Denies fevers, chills or abnormal night sweats Eyes: Denies blurriness of vision, double vision or watery eyes Ears, nose, mouth, throat, and face: Denies mucositis or sore throat Respiratory: Denies cough, dyspnea or wheezes Cardiovascular: Denies palpitation, chest discomfort or lower extremity swelling Gastrointestinal:  Denies nausea, heartburn or change in bowel habits Skin: Denies abnormal skin rashes Lymphatics: Denies new lymphadenopathy or easy bruising Neurological:Denies numbness, tingling or new weaknesses Behavioral/Psych: Mood is stable, no new changes  All other systems were reviewed with the patient and are negative.  PHYSICAL EXAMINATION: ECOG PERFORMANCE STATUS: 1 - Symptomatic but completely ambulatory GENERAL:alert, no distress and comfortable SKIN: skin color, texture, turgor are normal, no rashes or significant lesions EYES: normal, conjunctiva are pink and non-injected, sclera clear OROPHARYNX:no exudate, no erythema and lips, buccal mucosa, and tongue normal  NECK: supple, thyroid normal size, non-tender, without nodularity LYMPH:  Palpable lymphadenopathy in the left side of his neck.  LUNGS: clear to auscultation and percussion with normal breathing effort HEART: regular rate & rhythm and no murmurs and no lower extremity edema ABDOMEN:abdomen soft, non-tender and normal bowel sounds Musculoskeletal:no cyanosis of digits and no clubbing  PSYCH: alert & oriented x 3 with fluent speech NEURO: no focal motor/sensory deficits  LABORATORY DATA:  I have reviewed the data as listed Lab Results  Component Value Date   WBC 6.1 12/30/2012   HGB 16.8 12/30/2012   HCT 48.0 12/30/2012   MCV 90.1 12/30/2012   PLT 185 12/30/2012   Lab Results  Component Value Date   NA 140 12/30/2012   K 4.5 12/30/2012   CL 103 12/30/2012   CO2 22  12/30/2012    RADIOGRAPHIC STUDIES: I have personally reviewed the radiological images as listed and agreed with the findings in the report. Dg Chest 2 View  03/03/2014   CLINICAL DATA:  Three days of left-sided neck swelling with palpable nodule; history of smoking  EXAM: CHEST  2 VIEW  COMPARISON:  CT scan of the chest dated November 24, 2012  FINDINGS: The lungs are hyperinflated consistent with COPD. There is no alveolar infiltrate. There is no pleural effusion or pneumothorax. The heart and pulmonary vascularity are normal. The mediastinum is normal in width. There is mild tortuosity of the descending thoracic aorta. The bony thorax exhibits no acute abnormality.  IMPRESSION: COPD. There is no active cardiopulmonary disease. No mediastinal or hilar lymphadenopathy is demonstrated.   Electronically Signed   By: David  Martinique   On: 03/03/2014 08:41   US Soft Tissue Head/neck  03/04/2014   CLINICAL DATA:  Left neck soft tissue masses.  EXAM:  ULTRASOUND OF HEAD/NECK SOFT TISSUES  TECHNIQUE: Ultrasound examination of the head and neck soft tissues was performed in the area of clinical concern.  COMPARISON:  None.  FINDINGS: The palpable abnormality corresponds to a multiple soft tissue masses with some internal vascularity by color flow Doppler. 3.5 x 2.1 x 3.9 cm solid mass. 1.5 x 1.3 x 1.2 cm solid mass with echogenic foci. 2.6 x 1.5 x 2.0 cm solid mass with echogenic foci. 3.4 x 2.1 x 2.6 cm complex mass with some internal blood flow. 2.1 x 1.5 x 2.0 cm solid mass with internal blood flow. A right-sided neck mass is also identified measuring 2.2 x 1.1 x 2.1 cm.  IMPRESSION: Multiple bilateral neck masses predominately on the left are noted worrisome for metastatic adenopathy. CT soft tissue neck with contrast and ENT examination are recommended.   Electronically Signed   By: Marybelle Killings M.D.   On: 03/04/2014 15:49    ASSESSMENT:  Newly diagnosed squamous cell carcinoma of the Head & Neck, HPV  Positive  PLAN:  Malignant neoplasm of base of tongue We discussed the general approach to this kind of cancer. PET CT scan is pending. If the PET CT scan show limited disease to his neck, the patient will qualify for concurrent chemoradiation therapy. The patient is aware of the possibility of leading to placement and port. I will call him as soon as PET/CT scan result is available. Due to potential hearing deficit, I recommend formal hearing evaluation so that it could help me determine whether the patient is a candidate for cisplatin-based chemotherapy.    Chronic alcoholism I informed the patient association between alcohol intake and this type of cancer. It is also associated with Dupuytren's contracture. I recommend the patient to slowly wean himself off alcohol intake over the next few weeks.   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 It is not significant but according to family member it is. I recommend formal hearing evaluation before we proceed with treatment.     All questions were answered. The patient knows to call the clinic with any problems, questions or concerns. I spent 55 minutes counseling the patient face to face. The total time spent in the appointment was 60 minutes and more than 50% was on counseling.     [facility], Blinda Turek, MD 03/18/2014 1:23 PM

## 2014-03-18 NOTE — Telephone Encounter (Signed)
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 Result of TSH conducted by Dr. Delfina Redwood indicated hypothyroidism and patient has been started on Synthroid.  She does not know dosage but will call in information when known. 2. Hearing test is scheduled for next week Monday. 3. They do not yet have an appt scheduled with Dental Medicine.  I noted referral has been made.  Gayleen Orem, RN, BSN, [place] at [place] [phone]

## 2014-03-19 ENCOUNTER — Telehealth: Payer: Self-pay | Admitting: *Deleted

## 2014-03-19 ENCOUNTER — Telehealth: Payer: Self-pay | Admitting: Hematology and Oncology

## 2014-03-19 NOTE — Telephone Encounter (Signed)
I reviewed results of PET CT scan with the patient yesterday. The patient has disease confined to his neck. He would be a candidate for concurrent chemoradiation.

## 2014-03-19 NOTE — Telephone Encounter (Signed)
Spoke with [name].  She reported that Dr. Delfina Redwood has started patient on 75 mcg levothyroxine.  I updated patients medication list.  I verified her awareness of PET results per Dr. Calton Dach call to patient yesterday afternoon.  She confirmed understanding of dental evaluation and audiology appts for next Monday.  Gayleen Orem, RN, BSN, [place] at [place] [phone]

## 2014-03-19 NOTE — Progress Notes (Signed)
Dylan Moreno     The patient scored a 3 on the Psychosocial Distress Thermometer which indicates mild distress. Clinical Social Worker unable to meet with pateint during clinic to asses for psychosocial needs.  Patient scored mild distress, does not require referral to [name].  ONCBCN DISTRESS SCREENING 03/19/2014  Screening Type Initial Screening  Distress experienced in past week (1-10) 3  Emotional problem type Nervousness/Anxiety;Adjusting to illness  Referral to [name] Yes    Support services briefly discussed by head and neck navigator and team.   Polo Riley, MSW, LCSW, OSW-C Clinical Social Worker [facility] [phone]

## 2014-03-19 NOTE — Progress Notes (Signed)
To provide support and care continuity, met with patient during H&N [place].  He was accompanied by his wife, son and business partner. 1. Further introduced myself as their Navigator, explained my role as a member of the Care Team, provided contact information, encouraged them to contact me with questions/concerns as treatments/procedures begin. 2. Provided New Patient Information packet:  Contact information for physicians and navigator and other members of the Care Team.  Fall Prevention Patient Safety Plan  Appointment Guideline  [facility] map with highlight of [place] 3. Patient indicated he has Advance Directives, will bring copies at next visit. 4. Provided introductory explanation of radiation treatment including SIM planning and fitting of head mask, showed them example of mask.   5. Provided photos/diagrams of feeding tube and PAC, explained their purpose. 6. Provided a tour of SIM and Tomo areas, explained treatment and arrival procedures including monitor in Radiation Oncology Waiting.   They verbalized understanding of information provided.

## 2014-03-22 ENCOUNTER — Encounter (HOSPITAL_COMMUNITY): Payer: Self-pay | Admitting: Dentistry

## 2014-03-22 ENCOUNTER — Ambulatory Visit: Payer: Medicare Other | Admitting: Radiation Oncology

## 2014-03-22 ENCOUNTER — Ambulatory Visit: Payer: Medicare Other

## 2014-03-22 ENCOUNTER — Ambulatory Visit (HOSPITAL_COMMUNITY): Payer: Self-pay | Admitting: Dentistry

## 2014-03-22 VITALS — BP 133/66 | HR 79 | Temp 98.2°F

## 2014-03-22 DIAGNOSIS — H905 Unspecified sensorineural hearing loss: Secondary | ICD-10-CM | POA: Diagnosis not present

## 2014-03-22 DIAGNOSIS — C01 Malignant neoplasm of base of tongue: Secondary | ICD-10-CM

## 2014-03-22 DIAGNOSIS — M264 Malocclusion, unspecified: Secondary | ICD-10-CM

## 2014-03-22 DIAGNOSIS — K036 Deposits [accretions] on teeth: Secondary | ICD-10-CM

## 2014-03-22 DIAGNOSIS — K030 Excessive attrition of teeth: Secondary | ICD-10-CM

## 2014-03-22 DIAGNOSIS — Z01818 Encounter for other preprocedural examination: Secondary | ICD-10-CM

## 2014-03-22 DIAGNOSIS — K08409 Partial loss of teeth, unspecified cause, unspecified class: Secondary | ICD-10-CM

## 2014-03-22 DIAGNOSIS — K0530 Chronic periodontitis, unspecified: Secondary | ICD-10-CM

## 2014-03-22 DIAGNOSIS — K029 Dental caries, unspecified: Secondary | ICD-10-CM

## 2014-03-22 DIAGNOSIS — M2632 Excessive spacing of fully erupted teeth: Secondary | ICD-10-CM

## 2014-03-22 MED ORDER — SODIUM FLUORIDE 1.1 % DT GEL: DENTAL | Status: DC

## 2014-03-22 NOTE — Progress Notes (Signed)
DENTAL CONSULTATION  Date of Consultation:  03/22/2014 Patient Name:   Dylan Moreno Date of Birth:   Nov 18, 1940 Medical Record Number: [id number]  VITALS: BP 133/66 mmHg  Pulse 79  Temp(Src) 98.2 F (36.8 C) (Oral)  CHIEF COMPLAINT: Patient referred for a dental consultation by Dr. Isidore Moos.  HPI: Dylan Moreno is a 74 year old male recently diagnosed with squamous cell carcinoma of the base of tongue. Patient with anticipated radiation therapy and possible chemotherapy. Patient is now seen as part of a medically necessary pre-chemoradiation therapy dental protocol examination.  Patient currently denies acute toothaches, swellings, or abscesses. The patient was last seen in December 2015 for an exam and cleaning. Patient sees Dr. Earley Favor as his primary dentist. Patient is seen every 6 months. The patient denies having any unmet dental needs.  PROBLEM LIST: Patient Active Problem List   Diagnosis Date Noted  . 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    Priority: High  . Chronic alcoholism 03/18/2014  . 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 TIA (transient ischemic attack) 12/29/2012  . Hyperlipemia   . ADHD (attention deficit hyperactivity disorder)     PMH: Past Medical History  Diagnosis Date  . Hyperlipemia   . Arthritis   . ADHD (attention deficit hyperactivity disorder)   . Wears glasses   . Hypercholesterolemia   . Peyronie's disease   . Kidney stones   . TIA (transient ischemic attack)   . Hypertension   . Anxiety   . GERD (gastroesophageal reflux disease)   . 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 TIA (transient ischemic attack) 12/29/12    left facial droop    PSH: Past Surgical History  Procedure Laterality Date  . Dupuytren contracture release  2012    left hand  . Tonsillectomy    . Shoulder arthroscopy w/ rotator cuff repair      left  . Colonoscopy    . Cystoscopy w/ ureteroscopy w/ lithotripsy  2011  .  Hand surgery      right  . Dupuytren contracture release Right 10/30/2012    Procedure: DUPUYTREN CONTRACTURE RELEASE RIGHT PALM,RING AND SMALL FINGER;  Surgeon: Cammie Sickle., MD;  Location: [facility];  Service: Orthopedics;  Laterality: Right;    ALLERGIES: No Known Allergies  MEDICATIONS: Current Outpatient Prescriptions  Medication Sig Dispense Refill  . amphetamine-dextroamphetamine (ADDERALL) 20 MG tablet Take 20 mg by mouth 2 (two) times daily.    Marland Kitchen aspirin 81 MG tablet Take 81 mg by mouth daily.    Marland Kitchen atorvastatin (LIPITOR) 10 MG tablet Take 10 mg by mouth daily.    Marland Kitchen levothyroxine (SYNTHROID, LEVOTHROID) 75 MCG tablet Take 75 mcg by mouth daily before breakfast.     . lisinopril (PRINIVIL,ZESTRIL) 10 MG tablet Take 10 mg by mouth daily.  3  . Multiple Vitamin (MULTIVITAMIN WITH MINERALS) TABS tablet Take 1 tablet by mouth daily.    . sodium fluoride (FLUORISHIELD) 1.1 % GEL dental gel Instill one drop of gel per tooth space of fluoride tray. Place over teeth for 5 minutes. Remove. Spit out excess. Repeat nightly. (Patient not taking: Reported on 03/22/2014) 120 mL prn   No current facility-administered medications for this visit.    LABS: Lab Results  Component Value Date   WBC 6.1 12/30/2012   HGB 16.8 12/30/2012   HCT 48.0 12/30/2012   MCV 90.1 12/30/2012   PLT 185 12/30/2012  Component Value Date/Time   NA 140 12/30/2012 0545   K 4.5 12/30/2012 0545   CL 103 12/30/2012 0545   CO2 22 12/30/2012 0545   GLUCOSE 99 12/30/2012 0545   BUN 20 12/30/2012 0545   CREATININE 0.82 12/30/2012 0545   CALCIUM 9.1 12/30/2012 0545   GFRNONAA 87* 12/30/2012 0545   GFRAA >90 12/30/2012 0545   Lab Results  Component Value Date   INR 0.95 12/30/2012   INR 0.96 12/29/2012   No results found for: PTT  SOCIAL HISTORY: History   Social History  . Marital Status: Married    Spouse Name: Vicente Males  . Number of Children: 1  . Years of Education: N/A    Occupational History  . Not on file.   Social History Main Topics  . Smoking status: Former Smoker -- 1.00 packs/day for 40 years    Types: Cigarettes, Cigars    Quit date: 03/02/2014  . Smokeless tobacco: Never Used     Comment: smokes occasionally  . Alcohol Use: 0.0 oz/week    0 Standard drinks or equivalent per week     Comment: every night (2-3oz of scotch or gin martini)  . Drug Use: No  . Sexual Activity: Not on file     Comment: mostly quit 2011-smokes occ   Other Topics Concern  . 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   He lives at home and has one grown son.   Son currently living with parents due to back surgery.   Highest level of education: 1.5 years of college    FAMILY HISTORY: Family History  Problem Relation Age of Onset  . Heart attack Mother     Deceased, 71  . Hypothyroidism Father     Deceased, 25  . Melanoma Brother     Living, 68  . Healthy Son   . Cancer Paternal Uncle     ?lung ca    REVIEW OF SYSTEMS: Reviewed with the patient and is included in dental record.  DENTAL HISTORY: CHIEF COMPLAINT: Patient referred for a dental consultation by Dr. Isidore Moos.  HPI: Dylan Moreno is a 74 year old male recently diagnosed with squamous cell carcinoma of the base of tongue. Patient with anticipated radiation therapy and possible chemotherapy. Patient is now seen as part of a medically necessary pre-chemoradiation therapy dental protocol examination.  Patient currently denies acute toothaches, swellings, or abscesses. The patient was last seen in December 2015 for an exam and cleaning. Patient sees Dr. Earley Favor as his primary dentist. Patient is seen every 6 months. The patient denies having any unmet dental needs.   DENTAL EXAMINATION: GENERAL: The patient is a well-developed, well-nourished male in no acute distress. HEAD AND NECK: The patient has palpable left neck lymphadenopathy.  Patient denies acute TMJ symptoms. Patient has a maximum interincisal opening of 48 mm. INTRAORAL EXAM: Patient has normal saliva. There is no evidence of oral abscess formation. DENTITION: Patient is missing tooth numbers 17 and 29. Multiple diastemas are noted. PERIODONTAL: Patient with chronic periodontitis with minimal plaque accumulations, radiographic calculus on molars, gingival recession, and incipient mandibular anterior tooth mobility. DENTAL CARIES/SUBOPTIMAL RESTORATIONS: Dental caries #13-occlusal. ENDODONTIC: Patient currently denies acute pulpitis symptoms. No obvious periapical pathology. CROWN AND BRIDGE: Patient has crowns on tooth numbers 14, 30, and 31. PROSTHODONTIC: There are no partial dentures. OCCLUSION: Patient has a poor occlusal scheme secondary to missing teeth, multiple diastemas, mandibular and maxillary anterior  attrition, and supra-eruption and drifting of the unopposed teeth into the edentulous areas.  RADIOGRAPHIC INTERPRETATION: Orthopantogram was taken and supplemented with a full series of dental radiographs. There are missing tooth numbers 17 and 29. There is supra-eruption and drifting of the unopposed teeth into the edentulous areas. Multiple diastemas are noted. There is incipient to moderate bone loss. No obvious periapical radiolucencies are noted. Multiple dental restorations are noted. The maxillary sinuses are noted to be well aerated.  ASSESSMENTS: 1. Squamous cell carcinoma of left base of tongue with left neck involvement. 2. Pre-chemoradiation therapy dental protocol 3. Chronic periodontitis with bone loss 4. Accretions 5. Gingival recession 6. Incipient mandibular anterior tooth mobility 7. Dental caries #13  8. Missing tooth numbers 17 and 29. 9. Multiple diastemas. 10. Supra-eruption and drifting of the unopposed teeth into the edentulous areas. 11. Maxillary and mandibular anterior attrition 12. Poor occlusal scheme but a stable  occlusion.  PLAN/RECOMMENDATIONS: 1. I discussed the risks, benefits, and complications of various treatment options with the patient in relationship to his medical and dental conditions, anticipated chemoradiation therapy, and chemoradiation therapy side effects to include xerostomia, radiation caries, trismus, mucositis, taste changes, gum and jawbone changes, and risk for infection, bleeding, and osteoradionecrosis. We discussed various treatment options to include no treatment, multiple extractions with alveoloplasty, pre-prosthetic surgery as indicated, periodontal therapy, dental restorations, root canal therapy, crown and bridge therapy, implant therapy, and replacement of missing teeth as indicated.  We also discussed fabrication of fluoride trays and scatter guards. The patient currently refuses any extractions at this time. The patient wishes to proceed with impressions today for the fabrication of upper and lower fluoride trays and scatter guards. A prescription for FluoriSHIELD was provided to the patient today. Patient will follow-up with Dr. Gloriann Loan for restoration of tooth #13 as well as an additional dental cleaning as time and space permits.   2. Discussion of findings with medical team and coordination of future medical and dental care as needed.  I spent in excess of  120 minutes during the conduct of this consultation and >50% of this time involved direct face-to-face encounter for counseling and/or coordination of the patient's care.    Lenn Cal, DDS

## 2014-03-22 NOTE — Patient Instructions (Signed)

## 2014-03-23 ENCOUNTER — Encounter (HOSPITAL_COMMUNITY): Payer: Self-pay | Admitting: Dentistry

## 2014-03-23 ENCOUNTER — Other Ambulatory Visit: Payer: Self-pay | Admitting: Hematology and Oncology

## 2014-03-23 ENCOUNTER — Encounter: Payer: Self-pay | Admitting: *Deleted

## 2014-03-23 ENCOUNTER — Ambulatory Visit (HOSPITAL_COMMUNITY): Payer: Self-pay | Admitting: Dentistry

## 2014-03-23 ENCOUNTER — Other Ambulatory Visit: Payer: Self-pay | Admitting: Radiation Oncology

## 2014-03-23 VITALS — BP 112/72 | HR 82 | Temp 97.7°F

## 2014-03-23 DIAGNOSIS — C01 Malignant neoplasm of base of tongue: Secondary | ICD-10-CM

## 2014-03-23 DIAGNOSIS — Z01818 Encounter for other preprocedural examination: Secondary | ICD-10-CM

## 2014-03-23 DIAGNOSIS — Z463 Encounter for fitting and adjustment of dental prosthetic device: Secondary | ICD-10-CM

## 2014-03-23 NOTE — Patient Instructions (Addendum)

## 2014-03-23 NOTE — Progress Notes (Signed)
03/23/2014  Patient Name:   Dylan Moreno Date of Birth:   12-08-1940 Medical Record Number: [id number]  BP 112/72 mmHg  Pulse 82  Temp(Src) 97.7 F (36.5 C) (Oral)  Florene Route Kato now presents for insertion of upper and lower fluoride trays and scatter protection devices.  PROCEDURE: Appliances were tried in and adjusted as needed. [place] Trismus device was previously fabricated 48 mm using 29 sticks. Postop instructions were provided and a written and verbal format concerning the use and care of appliances. All questions were answered. Patient to return to clinic for periodic oral examination in approximately 2-3 weeks during radiation therapy. Patient to call if questions or problems arise before then.  Lenn Cal, DDS

## 2014-03-23 NOTE — Progress Notes (Unsigned)
Received copy of patient's 03/22/14 audiology report, delivered to Dr. Alvy Bimler.  Gayleen Orem, RN, BSN, [place] at [place] [phone]

## 2014-03-24 ENCOUNTER — Telehealth: Payer: Self-pay | Admitting: Hematology and Oncology

## 2014-03-24 ENCOUNTER — Telehealth: Payer: Self-pay | Admitting: *Deleted

## 2014-03-24 NOTE — Telephone Encounter (Signed)
s.w. deb and advised n March and April appt....ok and aware

## 2014-03-24 NOTE — Telephone Encounter (Signed)
Per staff message and POF I have scheduled appts. Advised scheduler of appts. JMW  

## 2014-03-25 ENCOUNTER — Ambulatory Visit (HOSPITAL_COMMUNITY): Payer: Medicare Other

## 2014-03-26 ENCOUNTER — Ambulatory Visit
Admission: RE | Admit: 2014-03-26 | Discharge: 2014-03-26 | Disposition: A | Discharge status: Home / Self Care | Payer: Medicare Other | Source: Ambulatory Visit | Attending: Radiation Oncology | Admitting: Radiation Oncology

## 2014-03-26 ENCOUNTER — Encounter: Payer: Self-pay | Admitting: *Deleted

## 2014-03-26 ENCOUNTER — Other Ambulatory Visit (HOSPITAL_BASED_OUTPATIENT_CLINIC_OR_DEPARTMENT_OTHER): Payer: Medicare Other

## 2014-03-26 ENCOUNTER — Other Ambulatory Visit: Payer: Medicare Other

## 2014-03-26 DIAGNOSIS — Z510 Encounter for antineoplastic radiation therapy: Secondary | ICD-10-CM | POA: Insufficient documentation

## 2014-03-26 DIAGNOSIS — C01 Malignant neoplasm of base of tongue: Secondary | ICD-10-CM

## 2014-03-26 DIAGNOSIS — Z931 Gastrostomy status: Secondary | ICD-10-CM | POA: Diagnosis not present

## 2014-03-26 DIAGNOSIS — F411 Generalized anxiety disorder: Secondary | ICD-10-CM | POA: Insufficient documentation

## 2014-03-26 DIAGNOSIS — L598 Other specified disorders of the skin and subcutaneous tissue related to radiation: Secondary | ICD-10-CM | POA: Insufficient documentation

## 2014-03-26 DIAGNOSIS — B370 Candidal stomatitis: Secondary | ICD-10-CM | POA: Insufficient documentation

## 2014-03-26 DIAGNOSIS — R4182 Altered mental status, unspecified: Secondary | ICD-10-CM | POA: Diagnosis not present

## 2014-03-26 DIAGNOSIS — Z79891 Long term (current) use of opiate analgesic: Secondary | ICD-10-CM | POA: Insufficient documentation

## 2014-03-26 DIAGNOSIS — K1233 Oral mucositis (ulcerative) due to radiation: Secondary | ICD-10-CM | POA: Diagnosis not present

## 2014-03-26 DIAGNOSIS — R1310 Dysphagia, unspecified: Secondary | ICD-10-CM | POA: Insufficient documentation

## 2014-03-26 LAB — CBC WITH DIFFERENTIAL/PLATELET
BASO%: 0.7 % (ref 0.0–2.0)
Basophils Absolute: 0 10*3/uL (ref 0.0–0.1)
EOS%: 1.5 % (ref 0.0–7.0)
Eosinophils Absolute: 0.1 10*3/uL (ref 0.0–0.5)
HCT: 48.8 % (ref 38.4–49.9)
HGB: 16.1 g/dL (ref 13.0–17.1)
LYMPH%: 14.1 % (ref 14.0–49.0)
MCH: 29.3 pg (ref 27.2–33.4)
MCHC: 33 g/dL (ref 32.0–36.0)
MCV: 88.9 fL (ref 79.3–98.0)
MONO#: 0.7 10*3/uL (ref 0.1–0.9)
MONO%: 10.2 % (ref 0.0–14.0)
NEUT#: 5.4 10*3/uL (ref 1.5–6.5)
NEUT%: 73.5 % (ref 39.0–75.0)
PLATELETS: 220 10*3/uL (ref 140–400)
RBC: 5.49 10*6/uL (ref 4.20–5.82)
RDW: 12.9 % (ref 11.0–14.6)
WBC: 7.3 10*3/uL (ref 4.0–10.3)
lymph#: 1 10*3/uL (ref 0.9–3.3)

## 2014-03-26 LAB — COMPREHENSIVE METABOLIC PANEL (CC13)
ALBUMIN: 4.1 g/dL (ref 3.5–5.0)
ALK PHOS: 99 U/L (ref 40–150)
ALT: 34 U/L (ref 0–55)
AST: 24 U/L (ref 5–34)
Anion Gap: 9 mEq/L (ref 3–11)
BUN: 21.5 mg/dL (ref 7.0–26.0)
CO2: 24 mEq/L (ref 22–29)
Calcium: 9.9 mg/dL (ref 8.4–10.4)
Chloride: 106 mEq/L (ref 98–109)
Creatinine: 1 mg/dL (ref 0.7–1.3)
EGFR: 77 mL/min/{1.73_m2} — AB (ref >90)
GLUCOSE: 103 mg/dL (ref 70–140)
Potassium: 4.6 mEq/L (ref 3.5–5.1)
Sodium: 139 mEq/L (ref 136–145)
TOTAL PROTEIN: 7.4 g/dL (ref 6.4–8.3)
Total Bilirubin: 0.83 mg/dL (ref 0.20–1.20)

## 2014-03-26 NOTE — Progress Notes (Signed)
Simulation, IMRT treatment planning, and Special treatment procedure note   Outpatient  Diagnosis:    ICD-9-CM ICD-10-CM   1. 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 An Aquaplast head and shoulder mask was custom fitted to the patient's anatomy. High-resolution CT axial imaging was obtained of the head and neck without contrast (venous access couldn't be obtained). I verified that the quality of the imaging is good for treatment planning. 1 Medically Necessary Treatment Device was fabricated and supervised by me: Aquaplast mask.   Treatment planning note I plan to treat the patient with helical Tomotherapy, IMRT. I plan to treat the patient's tumor and bilateral neck nodes. I plan to treat to a total dose of 70 Gray in 35  Fractions. Dose calculation was ordered from dosimetry.  IMRT planning Note  IMRT is an important modality to deliver adequate dose to the patient's at risk tissues while sparing the patient's normal structures, including the: esophagus, parotid tissue, mandible, brain stem, spinal cord, oral cavity, brachial plexus.  This justifies the use of IMRT in the patient's treatment.   Special Treatment Procedure Note:  The patient will be receiving chemotherapy concurrently. Chemotherapy heightens the risk of side effects. I have considered this during the patient's treatment planning process and will monitor the patient accordingly for side effects on a weekly basis. Concurrent chemotherapy increases the complexity of this patient's treatment and therefore this constitutes a special treatment procedure.  -----------------------------------  Eppie Gibson, MD

## 2014-03-27 NOTE — Progress Notes (Signed)
To provide support and encouragement, care continuity and to assess for needs, met with patient during CT Sim.  His wife accompanied him today. 1. Patient tolerated procedure without difficulty. 2. After Sim, I repeated tour of Tomo, explaination of arrival and preparation procedure including notification by Radiation Waiting monitor. 3. I facilitated wife's follow-up call with [facility] to arrange time for 04/02/14 PEG and PAC placement.  I explained that I will be placing a HH referral, explained the timing and purpose of this home visit. 4. Patient and wife understand that I can be contacted with needs/concerns.  Gayleen Orem, RN, BSN, [place] [facility] [place] [phone]

## 2014-03-29 ENCOUNTER — Ambulatory Visit: Payer: Medicare Other

## 2014-03-29 ENCOUNTER — Other Ambulatory Visit: Payer: Self-pay | Admitting: *Deleted

## 2014-03-29 ENCOUNTER — Encounter: Payer: Self-pay | Admitting: *Deleted

## 2014-03-29 DIAGNOSIS — C01 Malignant neoplasm of base of tongue: Secondary | ICD-10-CM | POA: Diagnosis not present

## 2014-03-29 DIAGNOSIS — R1310 Dysphagia, unspecified: Secondary | ICD-10-CM

## 2014-03-29 NOTE — Progress Notes (Signed)
Placed home health referral for patient to receive post-PEG placement education/care.  Verbally notified Kristen with [facility].  Gayleen Orem, RN, BSN, [facility] [place] [phone]

## 2014-03-29 NOTE — Patient Instructions (Signed)
SWALLOWING EXERCISES Do these 6 of the 7 days per week until 6 months after your radiation day, then 2 times per week afterwards  1. Effortful Swallows - Squeeze hard with the muscles in your neck while you swallow your  saliva or a sip of water - Repeat 15-20 times, 2-3 times a day, and use whenever you eat or drink  2. Masako Swallow - swallow with your tongue sticking out - Stick tongue out past your teeth and gently bite tongue with your teeth - Swallow, while holding your tongue with your teeth - Repeat 15-20 times, 2-3 times a day *use a wet spoon if your mouth gets dry*  3. Shaker Exercise - head lift - Lie flat on your back in your bed or on a couch without pillows - Raise your head and look at your feet - KEEP YOUR SHOULDERS DOWN - HOLD FOR 45-60 SECONDS, then lower your head back down - Repeat 3 times, 2-3 times a day        3b. Complete 30 head lifts with the same motion as the Shaker, approx 1 second in duration, once a day  4. Mendelsohn Maneuver - "half swallow" exercise - Start to swallow, and keep your Adam's apple up by squeezing hard with the muscles of the throat - Hold the squeeze for 5-7 seconds and then relax - Repeat 15 times, 2-3 times a day *use a wet spoon if your mouth gets dry*  5. Tongue Press - Press your entire tongue as hard as you can against the roof of your mouth for 3-5 seconds - Repeat 20 times, 2-3 times a day  6. Breath Hold - Say "HUH!" loudly, then hold your breath for 3 seconds at your voice box - Repeat 20 times, 2-3 times a day  7. Chin pushback - Open your mouth  - Place your fist UNDER your chin near your neck, and push back with your fist for 5 seconds - Repeat 8-10 times, 2-3 times a day

## 2014-03-29 NOTE — Therapy (Signed)
[facility] [address] [place], [place], [address] Phone: [phone]   Fax:  [phone]  Speech Language Pathology Evaluation  Patient Details  Name: Dylan Moreno MRN: [id number] Date of Birth: 1940-07-18 Referring Provider:  Eppie Gibson, MD  Encounter Date: 03/29/2014      End of Session - 03/29/14 1516    Visit Number 1   Number of Visits 3   Date for SLP Re-Evaluation 05/28/14   SLP Start Time 1406   SLP Stop Time  1455   SLP Time Calculation (min) 49 min   Activity Tolerance Patient tolerated treatment well      Past Medical History  Diagnosis Date  . Hyperlipemia   . Arthritis   . ADHD (attention deficit hyperactivity disorder)   . Wears glasses   . Hypercholesterolemia   . Peyronie's disease   . Kidney stones   . TIA (transient ischemic attack)   . Hypertension   . Anxiety   . GERD (gastroesophageal reflux disease)   . 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 TIA (transient ischemic attack) 12/29/12    left facial droop    Past Surgical History  Procedure Laterality Date  . Dupuytren contracture release  2012    left hand  . Tonsillectomy    . Shoulder arthroscopy w/ rotator cuff repair      left  . Colonoscopy    . Cystoscopy w/ ureteroscopy w/ lithotripsy  2011  . Hand surgery      right  . Dupuytren contracture release Right 10/30/2012    Procedure: DUPUYTREN CONTRACTURE RELEASE RIGHT PALM,RING AND SMALL FINGER;  Surgeon: Cammie Sickle., MD;  Location: [facility];  Service: Orthopedics;  Laterality: Right;    There were no vitals filed for this visit.  Visit Diagnosis: Dysphagia      Subjective Assessment - 03/29/14 1501    Symptoms No overt s/s swallowing issues reported.            SLP Evaluation [facility] - 03/29/14 1504    SLP Visit Information   Medical Diagnosis SCCA tongue base   Pain Assessment   Currently in Pain?  No/denies   General Information   HPI Pt with lt neck mass for 5 days. FNA tongue base 03-05-14 with malignant cells consistent with SCCA. Rad planned begin 04-06-14, chemo planned begin4-6-16. PEG placed Friday 04-02-14. Pt with h/o TIA/CVA with persistent lt sided weakness and foot drag with ambulation. Pt recently quit tobacco, is weaning ETOH.   Cognition   Memory Impaired   Memory Impairment Retrieval deficit;Decreased recall of new information;Decreased short term memory   Oral Motor/Sensory Function   Overall Oral Motor/Sensory Function Appears within functional limits for tasks assessed   Labial ROM Within Functional Limits   Labial Coordination WFL   Lingual ROM Within Functional Limits   Lingual Symmetry Within Functional Limits   Lingual Strength Within Functional Limits   Lingual Coordination WFL   Facial Symmetry --  poss mild lt droop   Velum Within Functional Limits   Motor Speech   Overall Motor Speech Appears within functional limits for tasks assessed   Motor Planning Witnin functional limits  mild dysfluencies-intermittent, poss language based      Pt currently tolerates regular diet/thin liquids.  POs: Pt consumed ham sandwich and water without overt s/s aspiration. Thyroid elevation appeared adequate, and swallows appeared timely. Oral residue noted as WNL. Pt's swallow deemed  WNL/WFL at this time.   Because data states the risk for dysphagia during and after radiation treatment is high due to undergoing radiation tx, SLP taught pt about the possibility of reduced/limited ability for PO intake during rad tx. SLP encouraged pt to continue swallowing POs as far into rad tx as possible, even ingesting POs and/or completing HEP shortly after administration of pain meds as directed by MD.   SLP educated pt re: changes to swallowing musculature after rad tx, and why adherence to dysphagia HEP provided today and PO consumption was necessary to inhibit muscular disuse atrophy and to  reduce muscle fibrosis following rad tx. Pt demonstrated understanding of these things to SLP. Further education was provided re: xerostomia, mucositis/esophagitis, and late effects head/neck radiation.   After eval tasks, SLP then developed a HEP for pt and pt was instructed how to perform exercises involving lingual, vocal, and pharyngeal strengthening. SLP performed each exercise and pt return demonstrated each exercise. Pt asked questions he asked previously in the evaluation x4-5, demonstrating decr'd memory/retrieval skills. SLP ensured pt performance was correct prior to moving on to next exercise. Wife observed and took notes as well, to A pt as necessary after evaluation today. Pt was instructed to complete this program x2-3 times a day, 6-7 days/week until 60 days after their last rad tx, then x3 a week after that.          SLP Education - 03/29/14 1515    Education provided Yes   Education Details HEP, muscle disuse atrophy, head/neck muscular fibrosis   Person(s) Educated Patient;Spouse   Methods Explanation;Demonstration;Verbal cues;Handout   Comprehension Verbalized understanding;Returned demonstration;Verbal cues required;Need further instruction          SLP Short Term Goals - 03/29/14 1521    SLP SHORT TERM GOAL #1   Title pt will complete HEP with occasional min A   Time 1   Period --  visit   Status New   SLP SHORT TERM GOAL #2   Title pt will tell SLP why he is completing HEP with rare min A   Time 1   Period --  visit   Status New          SLP Long Term Goals - 03/29/14 1521    SLP LONG TERM GOAL #1   Title pt will complete HEP with rare min A   Time 3   Period --  visits   Status New   SLP LONG TERM GOAL #2   Title pt will tell SLP 3 signs/symptoms aspiration PNA   Time 3   Period --  visits   Status New   SLP LONG TERM GOAL #3   Title pt will tell SLP how a food journal can be helpful in return to full PO diet   Time 3   Period --  visits    Status New          Plan - 03/29/14 1517    Clinical Impression Statement Pt with WNL swallowing at this time, however swallow safety can change over course of rad tx. Skilled SLP needed to assess pt safety with POs and success with HEP procedure.   Speech Therapy Frequency --  approx every four weeks   Duration --  for 60 days   Treatment/Interventions Pharyngeal strengthening exercises;Oral motor exercises;Compensatory strategies;Patient/family education;SLP instruction and feedback   Potential to Achieve Goals Good   Potential Considerations Ability to learn/carryover information  due to decr'd memory/retrieval   SLP  Home Exercise Plan provided today April 20, 2014   Consulted and Agree with Plan of Care Family member/caregiver;Patient   Family Member Consulted wife          G-Codes - 20-Apr-2014 1523    Functional Assessment Tool Used noms   Functional Limitations Swallowing   Swallow Current Status [phone]) 0 percent impaired, limited or restricted   Swallow Goal Status ([id number]) At least 1 percent but less than 20 percent impaired, limited or restricted      Problem List Patient Active Problem List   Diagnosis Date Noted  . Chronic alcoholism 03/18/2014  . 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  . TIA (transient ischemic attack) 12/29/2012  . Hyperlipemia   . ADHD (attention deficit hyperactivity disorder)     Garald Balding, SLP April 20, 2014, 3:24 PM  [facility] [address] [place], [place], [address] Phone: [phone]   Fax:  [phone]

## 2014-03-30 ENCOUNTER — Other Ambulatory Visit: Payer: Self-pay | Admitting: Hematology and Oncology

## 2014-03-30 DIAGNOSIS — K1233 Oral mucositis (ulcerative) due to radiation: Secondary | ICD-10-CM | POA: Diagnosis not present

## 2014-03-30 DIAGNOSIS — B370 Candidal stomatitis: Secondary | ICD-10-CM | POA: Diagnosis not present

## 2014-03-30 DIAGNOSIS — C01 Malignant neoplasm of base of tongue: Secondary | ICD-10-CM | POA: Diagnosis not present

## 2014-03-30 DIAGNOSIS — F411 Generalized anxiety disorder: Secondary | ICD-10-CM | POA: Diagnosis not present

## 2014-03-30 DIAGNOSIS — Z510 Encounter for antineoplastic radiation therapy: Secondary | ICD-10-CM | POA: Diagnosis not present

## 2014-03-30 DIAGNOSIS — L598 Other specified disorders of the skin and subcutaneous tissue related to radiation: Secondary | ICD-10-CM | POA: Diagnosis not present

## 2014-03-31 DIAGNOSIS — K1233 Oral mucositis (ulcerative) due to radiation: Secondary | ICD-10-CM | POA: Diagnosis not present

## 2014-03-31 DIAGNOSIS — B370 Candidal stomatitis: Secondary | ICD-10-CM | POA: Diagnosis not present

## 2014-03-31 DIAGNOSIS — Z510 Encounter for antineoplastic radiation therapy: Secondary | ICD-10-CM | POA: Diagnosis not present

## 2014-03-31 DIAGNOSIS — C01 Malignant neoplasm of base of tongue: Secondary | ICD-10-CM | POA: Diagnosis not present

## 2014-03-31 DIAGNOSIS — F411 Generalized anxiety disorder: Secondary | ICD-10-CM | POA: Diagnosis not present

## 2014-03-31 DIAGNOSIS — L598 Other specified disorders of the skin and subcutaneous tissue related to radiation: Secondary | ICD-10-CM | POA: Diagnosis not present

## 2014-04-01 ENCOUNTER — Other Ambulatory Visit: Payer: Self-pay | Admitting: Radiology

## 2014-04-02 ENCOUNTER — Other Ambulatory Visit: Payer: Self-pay | Admitting: Hematology and Oncology

## 2014-04-02 ENCOUNTER — Ambulatory Visit (HOSPITAL_COMMUNITY)
Admission: RE | Admit: 2014-04-02 | Discharge: 2014-04-02 | Disposition: A | Discharge status: Home / Self Care | Payer: Medicare Other | Source: Ambulatory Visit | Attending: Hematology and Oncology | Admitting: Hematology and Oncology

## 2014-04-02 ENCOUNTER — Encounter (HOSPITAL_COMMUNITY): Payer: Self-pay

## 2014-04-02 ENCOUNTER — Ambulatory Visit (HOSPITAL_COMMUNITY)
Admission: RE | Admit: 2014-04-02 | Discharge: 2014-04-02 | Disposition: A | Discharge status: Home / Self Care | Payer: Medicare Other | Source: Ambulatory Visit | Attending: Interventional Radiology | Admitting: Interventional Radiology

## 2014-04-02 DIAGNOSIS — Z452 Encounter for adjustment and management of vascular access device: Secondary | ICD-10-CM | POA: Diagnosis not present

## 2014-04-02 DIAGNOSIS — I10 Essential (primary) hypertension: Secondary | ICD-10-CM | POA: Diagnosis not present

## 2014-04-02 DIAGNOSIS — M1990 Unspecified osteoarthritis, unspecified site: Secondary | ICD-10-CM | POA: Diagnosis not present

## 2014-04-02 DIAGNOSIS — F419 Anxiety disorder, unspecified: Secondary | ICD-10-CM | POA: Insufficient documentation

## 2014-04-02 DIAGNOSIS — C01 Malignant neoplasm of base of tongue: Secondary | ICD-10-CM

## 2014-04-02 DIAGNOSIS — Z8673 Personal history of transient ischemic attack (TIA), and cerebral infarction without residual deficits: Secondary | ICD-10-CM | POA: Insufficient documentation

## 2014-04-02 DIAGNOSIS — F909 Attention-deficit hyperactivity disorder, unspecified type: Secondary | ICD-10-CM | POA: Diagnosis not present

## 2014-04-02 DIAGNOSIS — E780 Pure hypercholesterolemia: Secondary | ICD-10-CM | POA: Insufficient documentation

## 2014-04-02 DIAGNOSIS — Z79899 Other long term (current) drug therapy: Secondary | ICD-10-CM | POA: Diagnosis not present

## 2014-04-02 DIAGNOSIS — C099 Malignant neoplasm of tonsil, unspecified: Secondary | ICD-10-CM | POA: Diagnosis not present

## 2014-04-02 DIAGNOSIS — K219 Gastro-esophageal reflux disease without esophagitis: Secondary | ICD-10-CM | POA: Insufficient documentation

## 2014-04-02 DIAGNOSIS — Z5111 Encounter for antineoplastic chemotherapy: Secondary | ICD-10-CM | POA: Diagnosis not present

## 2014-04-02 DIAGNOSIS — Z87891 Personal history of nicotine dependence: Secondary | ICD-10-CM | POA: Diagnosis not present

## 2014-04-02 LAB — BASIC METABOLIC PANEL
Anion gap: 9 (ref 5–15)
BUN: 21 mg/dL (ref 6–23)
CO2: 25 mmol/L (ref 19–32)
CREATININE: 0.85 mg/dL (ref 0.50–1.35)
Calcium: 9.6 mg/dL (ref 8.4–10.5)
Chloride: 105 mmol/L (ref 96–112)
GFR calc Af Amer: >90 mL/min (ref >90)
GFR calc non Af Amer: 85 mL/min — ABNORMAL LOW (ref >90)
GLUCOSE: 87 mg/dL (ref 70–99)
Potassium: 4.2 mmol/L (ref 3.5–5.1)
Sodium: 139 mmol/L (ref 135–145)

## 2014-04-02 LAB — CBC
HCT: 44.3 % (ref 39.0–52.0)
Hemoglobin: 15 g/dL (ref 13.0–17.0)
MCH: 30.2 pg (ref 26.0–34.0)
MCHC: 33.9 g/dL (ref 30.0–36.0)
MCV: 89.3 fL (ref 78.0–100.0)
PLATELETS: 201 10*3/uL (ref 150–400)
RBC: 4.96 MIL/uL (ref 4.22–5.81)
RDW: 13 % (ref 11.5–15.5)
WBC: 6.7 10*3/uL (ref 4.0–10.5)

## 2014-04-02 LAB — PROTIME-INR
INR: 0.95 (ref 0.00–1.49)
PROTHROMBIN TIME: 12.8 s (ref 11.6–15.2)

## 2014-04-02 LAB — APTT: APTT: 31 s (ref 24–37)

## 2014-04-02 MED ORDER — FENTANYL CITRATE 0.05 MG/ML IJ SOLN
INTRAMUSCULAR | Status: AC
  Filled 2014-04-02: qty 4

## 2014-04-02 MED ORDER — LIDOCAINE-EPINEPHRINE 2 %-1:100000 IJ SOLN
INTRAMUSCULAR | Status: AC
  Filled 2014-04-02: qty 1

## 2014-04-02 MED ORDER — SODIUM CHLORIDE 0.9 % IV SOLN
Freq: Once | INTRAVENOUS | Status: AC
Start: 2014-04-02 — End: 2014-04-02
  Administered 2014-04-02: 13:00:00 via INTRAVENOUS

## 2014-04-02 MED ORDER — HEPARIN SOD (PORK) LOCK FLUSH 100 UNIT/ML IV SOLN
INTRAVENOUS | Status: AC | PRN
  Administered 2014-04-02: 500 [IU]

## 2014-04-02 MED ORDER — FENTANYL CITRATE 0.05 MG/ML IJ SOLN
INTRAMUSCULAR | Status: AC | PRN
  Administered 2014-04-02: 25 ug via INTRAVENOUS
  Administered 2014-04-02 (×2): 50 ug via INTRAVENOUS

## 2014-04-02 MED ORDER — HYDROCODONE-ACETAMINOPHEN 5-325 MG PO TABS
1.0000 | ORAL_TABLET | ORAL | Status: DC | PRN
  Administered 2014-04-02: 1 via ORAL
  Filled 2014-04-02: qty 1

## 2014-04-02 MED ORDER — CEFAZOLIN SODIUM-DEXTROSE 2-3 GM-% IV SOLR
INTRAVENOUS | Status: AC
  Filled 2014-04-02: qty 50

## 2014-04-02 MED ORDER — CEFAZOLIN SODIUM-DEXTROSE 2-3 GM-% IV SOLR
2.0000 g | Freq: Once | INTRAVENOUS | Status: AC
  Administered 2014-04-02: 2 g via INTRAVENOUS

## 2014-04-02 MED ORDER — ONDANSETRON HCL 4 MG/2ML IJ SOLN
4.0000 mg | INTRAMUSCULAR | Status: DC | PRN
Start: 2014-04-02 — End: 2014-04-03

## 2014-04-02 MED ORDER — LIDOCAINE HCL 1 % IJ SOLN
INTRAMUSCULAR | Status: AC
  Filled 2014-04-02: qty 20

## 2014-04-02 MED ORDER — MIDAZOLAM HCL 2 MG/2ML IJ SOLN
INTRAMUSCULAR | Status: AC | PRN
  Administered 2014-04-02: 0.5 mg via INTRAVENOUS
  Administered 2014-04-02: 1 mg via INTRAVENOUS
  Administered 2014-04-02 (×4): 0.5 mg via INTRAVENOUS
  Administered 2014-04-02: 1 mg via INTRAVENOUS
  Administered 2014-04-02: 0.5 mg via INTRAVENOUS

## 2014-04-02 MED ORDER — GLUCAGON HCL RDNA (DIAGNOSTIC) 1 MG IJ SOLR
INTRAMUSCULAR | Status: AC | PRN
  Administered 2014-04-02: 1 mg via INTRAVENOUS

## 2014-04-02 MED ORDER — GLUCAGON HCL RDNA (DIAGNOSTIC) 1 MG IJ SOLR
INTRAMUSCULAR | Status: AC
  Filled 2014-04-02: qty 1

## 2014-04-02 MED ORDER — IOHEXOL 300 MG/ML  SOLN
10.0000 mL | Freq: Once | INTRAMUSCULAR | Status: AC | PRN
  Administered 2014-04-02: 10 mL

## 2014-04-02 MED ORDER — MIDAZOLAM HCL 2 MG/2ML IJ SOLN
INTRAMUSCULAR | Status: AC
  Filled 2014-04-02: qty 6

## 2014-04-02 MED ORDER — HEPARIN SOD (PORK) LOCK FLUSH 100 UNIT/ML IV SOLN
INTRAVENOUS | Status: AC
  Filled 2014-04-02: qty 5

## 2014-04-02 NOTE — Discharge Instructions (Signed)
Gastrostomy Tube Home Guide A gastrostomy tube is a tube that is surgically placed into the stomach. It is also called a "G-tube." G-tubes are used when a person is unable to eat and drink enough on their own to stay healthy. The tube is inserted into the stomach through a small cut (incision) in the skin. This tube is used for:  Feeding.  Giving medication. GASTROSTOMY TUBE CARE  Wash your hands with soap and water.  Remove the old dressing (if any). Some styles of G-tubes may need a dressing inserted between the skin and the G-tube. Other types of G-tubes do not require a dressing. Ask your health care provider if a dressing is needed.  Check the area where the tube enters the skin (insertion site) for redness, swelling, or pus-like (purulent) drainage. A small amount of clear or tan liquid drainage is normal. Check to make sure scar tissue (skin) is not growing around the insertion site. This could have a raised, bumpy appearance.  A cotton swab can be used to clean the skin around the tube:  When the G-tube is first put in, a normal saline solution or water can be used to clean the skin.  Mild soap and warm water can be used when the skin around the G-tube site has healed.  Roll the cotton swab around the G-tube insertion site to remove any drainage or crusting at the insertion site. STOMACH RESIDUALS Feeding tube residuals are the amount of liquids that are in the stomach at any given time. Residuals may be checked before giving feedings, medications, or as instructed by your health care provider.  Ask your health care provider if there are instances when you would not start tube feedings depending on the amount or type of contents withdrawn from the stomach.  Check residuals by attaching a syringe to the G-tube and pulling back on the syringe plunger. Note the amount, and return the residual back into the stomach. FLUSHING THE G-TUBE  The G-tube should be periodically flushed with  clean warm water to keep it from clogging.  Flush the G-tube after feedings or medications. Draw up 30 mL of warm water in a syringe. Connect the syringe to the G-tube and slowly push the water into the tube.  Do not push feedings, medications, or flushes rapidly. Flush the G-tube gently and slowly.  Only use syringes made for G-tubes to flush medications or feedings.  Your health care provider may want the G-tube flushed more often or with more water. If this is the case, follow your health care provider's instructions. FEEDINGS Your health care provider will determine whether feedings are given as a bolus (a certain amount given at one time and at scheduled times) or whether feedings will be given continuously on a feeding pump.   Formulas should be given at room temperature.  If feedings are continuous, no more than 4 hours worth of feedings should be placed in the feeding bag. This helps prevent spoilage or accidental excess infusion.  Cover and place unused formula in the refrigerator.  If feedings are continuous, stop the feedings when medications or flushes are given. Be sure to restart the feedings.  Feeding bags and syringes should be replaced as instructed by your health care provider. GIVING MEDICATION   In general, it is best if all medications are in a liquid form for G-tube administration. Liquid medications are less likely to clog the G-tube.  Mix the liquid medication with 30 mL (or amount recommended by your  health care provider) of warm water.  Draw up the medication into the syringe.  Attach the syringe to the G-tube and slowly push the mixture into the G-tube.  After giving the medication, draw up 30 mL of warm water in the syringe and slowly flush the G-tube.  For pills or capsules, check with your health care provider first before crushing medications. Some pills are not effective if they are crushed. Some capsules are sustained-release medications.  If  appropriate, crush the pill or capsule and mix with 30 mL of warm water. Using the syringe, slowly push the medication through the tube, then flush the tube with another 30 mL of tap water. G-TUBE PROBLEMS G-tube was pulled out.  Cause: May have been pulled out accidentally.  Solutions: Cover the opening with clean dressing and tape. Call your health care provider right away. The G-tube should be put in as soon as possible (within 4 hours) so the G-tube opening (tract) does not close. The G-tube needs to be put in at a health care setting. An X-ray needs to be done to confirm placement before the G-tube can be used again. Redness, irritation, soreness, or foul odor around the gastrostomy site.  Cause: May be caused by leakage or infection.  Solutions: Call your health care provider right away. Large amount of leakage of fluid or mucus-like liquid present (a large amount means it soaks clothing).  Cause: Many reasons could cause the G-tube to leak.  Solutions: Call your health care provider to discuss the amount of leakage. Skin or scar tissue appears to be growing where tube enters skin.   Cause: Tissue growth may develop around the insertion site if the G-tube is moved or pulled on excessively.  Solutions: Secure tube with tape so that excess movement does not occur. Call your health care provider. G-tube is clogged.  Cause: Thick formula or medication.  Solutions: Try to slowly push warm water into the tube with a large syringe. Never try to push any object into the tube to unclog it. Do not force fluid into the G-tube. If you are unable to unclog the tube, call your health care provider right away. TIPS  Head of bed (HOB) position refers to the upright position of a person's upper body.  When giving medications or a feeding bolus, keep the [facility] up as told by your health care provider. Do this during the feeding and for 1 hour after the feeding or medication administration.  If  continuous feedings are being given, it is best to keep the [facility] up as told by your health care provider. When ADLs (activities of daily living) are performed and the [facility] needs to be flat, be sure to turn the feeding pump off. Restart the feeding pump when the [facility] is returned to the recommended height.  Do not pull or put tension on the tube.  To prevent fluid backflow, kink the G-tube before removing the cap or disconnecting a syringe.  Check the G-tube length every day. Measure from the insertion site to the end of the G-tube. If the length is longer than previous measurements, the tube may be coming out. Call your health care provider if you notice increasing G-tube length.  Oral care, such as brushing teeth, must be continued.  You may need to remove excess air (vent) from the G-tube. Your health care provider will tell you if this is needed.  Always call your health care provider if you have questions or problems with the G-tube.  SEEK IMMEDIATE MEDICAL CARE IF:   You have severe abdominal pain, tenderness, or abdominal bloating (distension).  You have nausea or vomiting.  You are constipated or have problems moving your bowels.  The G-tube insertion site is red, swollen, has a foul smell, or has yellow or brown drainage.  You have difficulty breathing or shortness of breath.  You have a fever.  You have a large amount of feeding tube residuals.  The G-tube is clogged and cannot be flushed. MAKE SURE YOU:   Understand these instructions.  Will watch your condition.  Will get help right away if you are not doing well or get worse. Document Released: 02/26/2001 Document Revised: 05/04/2013 Document Reviewed: 08/25/2012 [facility] Patient Information 2015 [place], [place]. This information is not intended to replace advice given to you by your health care provider. Make sure you discuss any questions you have with your health care provider. Implanted Port Insertion, Care  After Refer to this sheet in the next few weeks. These instructions provide you with information on caring for yourself after your procedure. Your health care provider may also give you more specific instructions. Your treatment has been planned according to current medical practices, but problems sometimes occur. Call your health care provider if you have any problems or questions after your procedure. WHAT TO EXPECT AFTER THE PROCEDURE After your procedure, it is typical to have the following:   Discomfort at the port insertion site. Ice packs to the area will help.  Bruising on the skin over the port. This will subside in 3-4 days. HOME CARE INSTRUCTIONS  After your port is placed, you will get a manufacturer's information card. The card has information about your port. Keep this card with you at all times.   Know what kind of port you have. There are many types of ports available.   Wear a medical alert bracelet in case of an emergency. This can help alert health care workers that you have a port.   The port can stay in for as long as your health care provider believes it is necessary.   A home health care nurse may give medicines and take care of the port.   You or a family member can get special training and directions for giving medicine and taking care of the port at home.  SEEK MEDICAL CARE IF:   Your port does not flush or you are unable to get a blood return.   You have a fever or chills. SEEK IMMEDIATE MEDICAL CARE IF:  You have new fluid or pus coming from your incision.   You notice a bad smell coming from your incision site.   You have swelling, pain, or more redness at the incision or port site.   You have chest pain or shortness of breath. Document Released: 10/08/2012 Document Revised: 12/23/2012 Document Reviewed: 10/08/2012 [facility] Patient Information 2015 [place], [place]. This information is not intended to replace advice given to you by your health  care provider. Make sure you discuss any questions you have with your health care provider. Implanted [facility] Guide An implanted port is a type of central line that is placed under the skin. Central lines are used to provide IV access when treatment or nutrition needs to be given through a person's veins. Implanted ports are used for long-term IV access. An implanted port may be placed because:   You need IV medicine that would be irritating to the small veins in your hands or arms.  You need long-term IV medicines, such as antibiotics.   You need IV nutrition for a long period.   You need frequent blood draws for lab tests.   You need dialysis.  Implanted ports are usually placed in the chest area, but they can also be placed in the upper arm, the abdomen, or the leg. An implanted port has two main parts:   Reservoir. The reservoir is round and will appear as a small, raised area under your skin. The reservoir is the part where a needle is inserted to give medicines or draw blood.   Catheter. The catheter is a thin, flexible tube that extends from the reservoir. The catheter is placed into a large vein. Medicine that is inserted into the reservoir goes into the catheter and then into the vein.  HOW WILL I CARE FOR MY INCISION SITE? Do not get the incision site wet. Bathe or shower as directed by your health care provider.  HOW IS MY PORT ACCESSED? Special steps must be taken to access the port:   Before the port is accessed, a numbing cream can be placed on the skin. This helps numb the skin over the port site.   Your health care provider uses a sterile technique to access the port.  Your health care provider must put on a mask and sterile gloves.  The skin over your port is cleaned carefully with an antiseptic and allowed to dry.  The port is gently pinched between sterile gloves, and a needle is inserted into the port.  Only "non-coring" port needles should be used to  access the port. Once the port is accessed, a blood return should be checked. This helps ensure that the port is in the vein and is not clogged.   If your port needs to remain accessed for a constant infusion, a clear (transparent) bandage will be placed over the needle site. The bandage and needle will need to be changed every week, or as directed by your health care provider.   Keep the bandage covering the needle clean and dry. Do not get it wet. Follow your health care provider's instructions on how to take a shower or bath while the port is accessed.   If your port does not need to stay accessed, no bandage is needed over the port.  WHAT IS FLUSHING? Flushing helps keep the port from getting clogged. Follow your health care provider's instructions on how and when to flush the port. Ports are usually flushed with saline solution or a medicine called heparin. The need for flushing will depend on how the port is used.   If the port is used for intermittent medicines or blood draws, the port will need to be flushed:   After medicines have been given.   After blood has been drawn.   As part of routine maintenance.   If a constant infusion is running, the port may not need to be flushed.  HOW LONG WILL MY PORT STAY IMPLANTED? The port can stay in for as long as your health care provider thinks it is needed. When it is time for the port to come out, surgery will be done to remove it. The procedure is similar to the one performed when the port was put in.  WHEN SHOULD I SEEK IMMEDIATE MEDICAL CARE? When you have an implanted port, you should seek immediate medical care if:   You notice a bad smell coming from the incision site.   You have swelling, redness,  or drainage at the incision site.   You have more swelling or pain at the port site or the surrounding area.   You have a fever that is not controlled with medicine. Document Released: 12/18/2004 Document Revised:  10/08/2012 Document Reviewed: 08/25/2012 [facility] Patient Information 2015 [place], [place]. This information is not intended to replace advice given to you by your health care provider. Make sure you discuss any questions you have with your health care provider. Conscious Sedation Sedation is the use of medicines to promote relaxation and relieve discomfort and anxiety. Conscious sedation is a type of sedation. Under conscious sedation you are less alert than normal but are still able to respond to instructions or stimulation. Conscious sedation is used during short medical and dental procedures. It is milder than deep sedation or general anesthesia and allows you to return to your regular activities sooner.  LET [facility] CARE PROVIDER KNOW ABOUT:   Any allergies you have.  All medicines you are taking, including vitamins, herbs, eye drops, creams, and over-the-counter medicines.  Use of steroids (by mouth or creams).  Previous problems you or members of your family have had with the use of anesthetics.  Any blood disorders you have.  Previous surgeries you have had.  Medical conditions you have.  Possibility of pregnancy, if this applies.  Use of cigarettes, alcohol, or illegal drugs. RISKS AND COMPLICATIONS Generally, this is a safe procedure. However, as with any procedure, problems can occur. Possible problems include:  Oversedation.  Trouble breathing on your own. You may need to have a breathing tube until you are awake and breathing on your own.  Allergic reaction to any of the medicines used for the procedure. BEFORE THE PROCEDURE  You may have blood tests done. These tests can help show how well your kidneys and liver are working. They can also show how well your blood clots.  A physical exam will be done.  Only take medicines as directed by your health care provider. You may need to stop taking medicines (such as blood thinners, aspirin, or nonsteroidal  anti-inflammatory drugs) before the procedure.   Do not eat or drink at least 6 hours before the procedure or as directed by your health care provider.  Arrange for a responsible adult, family member, or friend to take you home after the procedure. He or she should stay with you for at least 24 hours after the procedure, until the medicine has worn off. PROCEDURE   An intravenous (IV) catheter will be inserted into one of your veins. Medicine will be able to flow directly into your body through this catheter. You may be given medicine through this tube to help prevent pain and help you relax.  The medical or dental procedure will be done. AFTER THE PROCEDURE  You will stay in a recovery area until the medicine has worn off. Your blood pressure and pulse will be checked.   Depending on the procedure you had, you may be allowed to go home when you can tolerate liquids and your pain is under control. Document Released: 09/12/2000 Document Revised: 12/23/2012 Document Reviewed: 08/25/2012 [facility] Patient Information 2015 [place], [place]. This information is not intended to replace advice given to you by your health care provider. Make sure you discuss any questions you have with your health care provider.

## 2014-04-02 NOTE — Discharge Instructions (Signed)
[facility] tonight or tomorrow. They will come out and see you on Saturday and explain care of the feeding tube. [phone]  May remove portacath bandage in 24 hours. Call MD if any redness, swelling, drainage at site. Call for pain or fever.     Conscious Sedation, Adult, Care After Refer to this sheet in the next few weeks. These instructions provide you with information on caring for yourself after your procedure. Your health care provider may also give you more specific instructions. Your treatment has been planned according to current medical practices, but problems sometimes occur. Call your health care provider if you have any problems or questions after your procedure. WHAT TO EXPECT AFTER THE PROCEDURE  After your procedure:  You may feel sleepy, clumsy, and have poor balance for several hours.  Vomiting may occur if you eat too soon after the procedure. HOME CARE INSTRUCTIONS  Do not participate in any activities where you could become injured for at least 24 hours. Do not:  Drive.  Swim.  Ride a bicycle.  Operate heavy machinery.  Cook.  Use power tools.  Climb ladders.  Work from a high place.  Do not make important decisions or sign legal documents until you are improved.  If you vomit, drink water, juice, or soup when you can drink without vomiting. Make sure you have little or no nausea before eating solid foods.  Only take over-the-counter or prescription medicines for pain, discomfort, or fever as directed by your health care provider.  Make sure you and your family fully understand everything about the medicines given to you, including what side effects may occur.  You should not drink alcohol, take sleeping pills, or take medicines that cause drowsiness for at least 24 hours.  If you smoke, do not smoke without supervision.  If you are feeling better, you may resume normal activities 24 hours after you were sedated.  Keep all  appointments with your health care provider. SEEK MEDICAL CARE IF:  Your skin is pale or bluish in color.  You continue to feel nauseous or vomit.  Your pain is getting worse and is not helped by medicine.  You have bleeding or swelling.  You are still sleepy or feeling clumsy after 24 hours. SEEK IMMEDIATE MEDICAL CARE IF:  You develop a rash.  You have difficulty breathing.  You develop any type of allergic problem.  You have a fever. MAKE SURE YOU:  Understand these instructions.  Will watch your condition.  Will get help right away if you are not doing well or get worse. Document Released: 10/08/2012 Document Reviewed: 10/08/2012 [facility] Patient Information 2015 [place], [place]. This information is not intended to replace advice given to you by your health care provider. Make sure you discuss any questions you have with your health care provider.  Implanted Port Insertion, Care After Refer to this sheet in the next few weeks. These instructions provide you with information on caring for yourself after your procedure. Your health care provider may also give you more specific instructions. Your treatment has been planned according to current medical practices, but problems sometimes occur. Call your health care provider if you have any problems or questions after your procedure. WHAT TO EXPECT AFTER THE PROCEDURE After your procedure, it is typical to have the following:   Discomfort at the port insertion site. Ice packs to the area will help.  Bruising on the skin over the port. This will subside in 3-4 days.  HOME CARE INSTRUCTIONS  After your port is placed, you will get a manufacturer's information card. The card has information about your port. Keep this card with you at all times.   Know what kind of port you have. There are many types of ports available.   Wear a medical alert bracelet in case of an emergency. This can help alert health care workers that you  have a port.   The port can stay in for as long as your health care provider believes it is necessary.   A home health care nurse may give medicines and take care of the port.   You or a family member can get special training and directions for giving medicine and taking care of the port at home.  SEEK MEDICAL CARE IF:   Your port does not flush or you are unable to get a blood return.   You have a fever or chills. SEEK IMMEDIATE MEDICAL CARE IF:  You have new fluid or pus coming from your incision.   You notice a bad smell coming from your incision site.   You have swelling, pain, or more redness at the incision or port site.   You have chest pain or shortness of breath. Document Released: 10/08/2012 Document Revised: 12/23/2012 Document Reviewed: 10/08/2012 [facility] Patient Information 2015 [place], [place]. This information is not intended to replace advice given to you by your health care provider. Make sure you discuss any questions you have with your health care provider.     PEG, Home Care You had a percutaneous endoscopic gastrostomy (PEG) tube put in your stomach. Do not put anything in your feeding tube until you have been shown how to use it. HOME CARE For the first 24 hours:  Do not sign any important or legal papers.  Do not drive. Every day:  Check the place where the tube goes into your belly. See your doctor if the tube site is red, puffy (swollen), or smells bad.  Clean around the tube with soap and water. Dry the area by patting it gently with a clean towel.  Do not put a bandage around the tube site. Keep the area dry.  You should turn the bumper on the outside of your belly. The bumper should lie flat against your skin so you are comfortable.  Do not put whole pills through the tube. Instead, crush and dissolve all pills in warm water before placing down tube. GET HELP RIGHT AWAY IF:  You throw up (vomit).  The pain in your belly gets  worse.  You have trouble breathing.  You have a temperature by mouth above 102 F (38.9 C), not controlled by medicine.  You have pain when you swallow.  You have chest pain.  There is green or yellow fluid (drainage) around the tube site that smells bad.  There is redness and puffiness around the tube site.  The tube comes out. MAKE SURE YOU:  Understand these instructions.  Will watch your condition.  Will get help right away if you are not doing well or get worse. Document Released: 01/20/2010 Document Revised: 08/20/2012 Document Reviewed: 06/19/2012 [facility] Patient Information 2015 [place], [place]. This information is not intended to replace advice given to you by your health care provider. Make sure you discuss any questions you have with your health care provider.  Care of a Feeding Tube People who have trouble swallowing or cannot take food or medicine by mouth are sometimes given feeding tubes. A feeding tube  can go into the nose and down to the stomach or through the skin in the abdomen and into the stomach or small bowel. Some of the names of these feeding tubes are gastrostomy tubes, PEG lines, nasogastric tubes, and gastrojejunostomy tubes.  SUPPLIES NEEDED TO CARE FOR THE TUBE SITE  Clean gloves.  Clean wash cloth, gauze pads, or soft paper towel.  Cotton swabs.  Skin barrier ointment or cream.  Soap and water.  Pre-cut foam pads or gauze (that go around the tube).  Tube tape. TUBE SITE CARE  Have all supplies ready and available.  Wash hands well.  Put on clean gloves.  Remove the soiled foam pad or gauze, if present, that is found under the tube stabilizer. Change the foam pad or gauze daily or when soiled or moist.  Check the skin around the tube site for redness, rash, swelling, drainage, or extra tissue growth. If you notice any of these, call your caregiver.  Moisten gauze and cotton swabs with water and soap.  Wipe the area closest to the  tube (right near the stoma) with cotton swabs. Wipe the surrounding skin with moistened gauze. Rinse with water.  Dry the skin and stoma site with a dry gauze pad or soft paper towel. Do not use antibiotic ointments at the tube site.  If the skin is red, apply a skin barrier cream or ointment (such as petroleum jelly) in a circular motion, using a cotton swab. The cream or ointment will provide a moisture barrier for the skin and helps with wound healing.  Apply a new pre-cut foam pad or gauze around the tube. Secure it with tape around the edges. If no drainage is present, foam pads or gauze may be left off.  Use tape or an anchoring device to fasten the feeding tube to the skin for comfort or as directed. Rotate where you tape the tube to avoid skin damage from the adhesive.  Position the person in a semi-upright position (30-45 degree angle).  Throw away used supplies.  Remove gloves.  Wash hands. SUPPLIES NEEDED TO FLUSH A FEEDING TUBE  Clean gloves.  60 mL syringe (that connects to the feeding tube).  Towel.  Water. FLUSHING A FEEDING TUBE   Have all supplies ready and available.  Wash hands well.  Put on clean gloves.  Draw up 30 mL of water in the syringe.  Kink the feeding tube while disconnecting it from the feeding-bag tubing or while removing the plug at the end of the tube. Kinking closes the tube and prevents secretions in the tube from spilling out.  Insert the tip of the syringe into the end of the feeding tube. Release the kink. Slowly inject the water.  If unable to inject the water, the person with the feeding tube should lay on his or her left side. The tip of the tube may be against the stomach wall, blocking fluid flow. Changing positions may move the tip away from the stomach wall. After repositioning, try injecting the water again.  After injecting the water, remove the syringe.  Always flush before giving the first medicine, between medicines, and  after the final medicine before starting a feeding. This prevents medicines from clogging the tube.  Throw away used supplies.  Remove gloves.  Wash hands. Document Released: 12/18/2004 Document Revised: 12/05/2011 Document Reviewed: 08/02/2011 [facility] Patient Information 2015 [place], [place]. This information is not intended to replace advice given to you by your health care provider. Make sure you  discuss any questions you have with your health care provider. ° °

## 2014-04-02 NOTE — H&P (Signed)
Chief Complaint: "I'm getting a port and stomach tube put in"  Referring Physician(s): Gorsuch,Ni  History of Present Illness: Dylan Moreno is a 74 y.o. 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  Past Medical History  Diagnosis Date  . Hyperlipemia   . Arthritis   . ADHD (attention deficit hyperactivity disorder)   . Wears glasses   . Hypercholesterolemia   . Peyronie's disease   . Kidney stones   . TIA (transient ischemic attack)   . Hypertension   . Anxiety   . GERD (gastroesophageal reflux disease)   . 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 TIA (transient ischemic attack) 12/29/12    left facial droop    Past Surgical History  Procedure Laterality Date  . Dupuytren contracture release  2012    left hand  . Tonsillectomy    . Shoulder arthroscopy w/ rotator cuff repair      left  . Colonoscopy    . Cystoscopy w/ ureteroscopy w/ lithotripsy  2011  . Hand surgery      right  . Dupuytren contracture release Right 10/30/2012    Procedure: DUPUYTREN CONTRACTURE RELEASE RIGHT PALM,RING AND SMALL FINGER;  Surgeon: Cammie Sickle., MD;  Location: [facility];  Service: Orthopedics;  Laterality: Right;    Allergies: Review of patient's allergies indicates no known allergies.  Medications: Prior to Admission medications   Medication Sig Start Date End Date Taking? Authorizing Provider  amphetamine-dextroamphetamine (ADDERALL) 20 MG tablet Take 20 mg by mouth 2 (two) times daily.   Yes Historical Provider, MD  aspirin 81 MG tablet Take 81 mg by mouth daily.   Yes Historical Provider, MD  atorvastatin (LIPITOR) 10 MG tablet Take 10 mg by mouth daily.   Yes Historical Provider, MD  clopidogrel (PLAVIX) 75 MG tablet Take 75 mg by mouth daily.  02/10/14  Yes Historical Provider, MD    levothyroxine (SYNTHROID, LEVOTHROID) 75 MCG tablet Take 75 mcg by mouth daily before breakfast.    Yes Seward Carol, MD  lisinopril (PRINIVIL,ZESTRIL) 10 MG tablet Take 10 mg by mouth every morning.  01/14/14  Yes Historical Provider, MD  Multiple Vitamin (MULTIVITAMIN WITH MINERALS) TABS tablet Take 1 tablet by mouth daily.   Yes Historical Provider, MD  sodium fluoride (FLUORISHIELD) 1.1 % GEL dental gel Instill one drop of gel per tooth space of fluoride tray. Place over teeth for 5 minutes. Remove. Spit out excess. Repeat nightly. 03/22/14  Yes Lenn Cal, DDS    Family History  Problem Relation Age of Onset  . Heart attack Mother     Deceased, 9  . Hypothyroidism Father     Deceased, 59  . Melanoma Brother     Living, 30  . Healthy Son   . Cancer Paternal Uncle     ?lung ca    History   Social History  . Marital Status: Married    Spouse Name: Vicente Males  . Number of Children: 1  . Years of Education: N/A   Social History Main Topics  . Smoking status: Former Smoker -- 1.00 packs/day for 40 years    Types: Cigarettes, Cigars    Quit date: 03/02/2014  . Smokeless tobacco: Never Used     Comment: smokes occasionally  . Alcohol Use: 0.0 oz/week    0  Standard drinks or equivalent per week     Comment: every night (2-3oz of scotch or gin martini)  . Drug Use: No  . Sexual Activity: Not on file     Comment: mostly quit 2011-smokes occ   Other Topics Concern  . 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   He lives at home and has one grown son.   Son currently living with parents due to back surgery.   Highest level of education: 1.5 years of college      Review of Systems  Constitutional: Negative for fever and chills.  HENT: Positive for hearing loss and sore throat.   Respiratory: Negative for cough and shortness of breath.   Cardiovascular: Negative for chest pain.  Gastrointestinal: Negative for nausea,  vomiting, abdominal pain and blood in stool.  Genitourinary: Negative for dysuria and hematuria.  Musculoskeletal: Negative for back pain.  Neurological: Negative for headaches.  Hematological: Positive for adenopathy.    Vital Signs: Blood pressure 138/80, heart rate 69, temperature 97.4, respirations 18, oxygen saturation is 95% room air  Physical Exam  Constitutional: He is oriented to person, place, and time. He appears well-developed and well-nourished.  Cardiovascular: Normal rate and regular rhythm.   Pulmonary/Chest: Effort normal and breath sounds normal.  Abdominal: Soft. Bowel sounds are normal. There is no tenderness.  Musculoskeletal: Normal range of motion. He exhibits no edema.  Lymphadenopathy:    He has cervical adenopathy.  Neurological: He is alert and oriented to person, place, and time.    Imaging: US Soft Tissue Head/neck  03/04/2014   CLINICAL DATA:  Left neck soft tissue masses.  EXAM: ULTRASOUND OF HEAD/NECK SOFT TISSUES  TECHNIQUE: Ultrasound examination of the head and neck soft tissues was performed in the area of clinical concern.  COMPARISON:  None.  FINDINGS: The palpable abnormality corresponds to a multiple soft tissue masses with some internal vascularity by color flow Doppler. 3.5 x 2.1 x 3.9 cm solid mass. 1.5 x 1.3 x 1.2 cm solid mass with echogenic foci. 2.6 x 1.5 x 2.0 cm solid mass with echogenic foci. 3.4 x 2.1 x 2.6 cm complex mass with some internal blood flow. 2.1 x 1.5 x 2.0 cm solid mass with internal blood flow. A right-sided neck mass is also identified measuring 2.2 x 1.1 x 2.1 cm.  IMPRESSION: Multiple bilateral neck masses predominately on the left are noted worrisome for metastatic adenopathy. CT soft tissue neck with contrast and ENT examination are recommended.   Electronically Signed   By: Marybelle Killings M.D.   On: 03/04/2014 15:49   Nm Pet Image Initial (pi) Skull Base To Thigh  03/18/2014   CLINICAL DATA:  Initial treatment strategy for  squamous cell carcinoma of the tongue.  EXAM: NUCLEAR MEDICINE PET SKULL BASE TO THIGH  TECHNIQUE: 9.3 mCi F-18 FDG was injected intravenously. Full-ring PET imaging was performed from the skull base to thigh after the radiotracer. CT data was obtained and used for attenuation correction and anatomic localization.  FASTING BLOOD GLUCOSE:  Value: 94 mg/dl  COMPARISON:  None.  FINDINGS: NECK  Hypermetabolic soft tissue prominence is seen in the posterior tongue base which has an SUV max of 11.2, consistent with history of primary squamous cell carcinoma.  Left-sided level 2 and 3 jugular lymphadenopathy is seen which is hypermetabolic. Largest lymph node at level 2 measures 2.7 cm and has an SUV max of 8.7.  Right-sided  1.2 cm level 2 jugular lymph is seen which is hypermetabolic, with SUV max of 5.7.  CHEST  No hypermetabolic mediastinal or hilar nodes. No suspicious pulmonary nodules on the CT scan.  ABDOMEN/PELVIS  No abnormal hypermetabolic activity within the liver, pancreas, adrenal glands, or spleen. No hypermetabolic lymph nodes in the abdomen or pelvis.  Sigmoid diverticulosis demonstrated, without evidence of diverticulitis.  SKELETON  No focal hypermetabolic activity to suggest skeletal metastasis.  IMPRESSION: Hypermetabolic soft tissue prominence in posterior tongue base, consistent with known primary squamous cell carcinoma.  Left greater than right hypermetabolic jugular lymphadenopathy, consistent with nodal metastatic disease.  No evidence of distant metastatic disease.   Electronically Signed   By: Earle Gell M.D.   On: 03/18/2014 15:26    Labs:  CBC:  Recent Labs  03/26/14 1117 04/02/14 1230  WBC 7.3 6.7  HGB 16.1 15.0  HCT 48.8 44.3  PLT 220 201    COAGS: No results for input(s): INR, APTT in the last 8760 hours.  BMP:  Recent Labs  03/26/14 1116  NA 139  K 4.6  CO2 24  GLUCOSE 103  BUN 21.5  CALCIUM 9.9  CREATININE 1.0    LIVER FUNCTION TESTS:  Recent Labs   03/26/14 1116  BILITOT 0.83  AST 24  ALT 34  ALKPHOS 99  PROT 7.4  ALBUMIN 4.1    TUMOR MARKERS: No results for input(s): AFPTM, CEA, CA199, CHROMGRNA in the last 8760 hours.  Assessment and Plan: Dylan Moreno is a 74 y.o. 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 Details/risks of procedures, including but not limited to internal bleeding, infection, venous thrombosis, injury to adjacent organs discussed with patient/family with their understanding and consent.   Signed: Autumn Messing 04/02/2014, 1:00 PM   I spent a total of 20 minutes face to face in clinical consultation, greater than 50% of which was counseling/coordinating care for Port-A-Cath and gastrostomy tube placements.

## 2014-04-02 NOTE — Progress Notes (Signed)
Patient back from IR post port and peg tube placement at 1510. VSS. G tube hooked up to low wall suction per order for two hours. Patient allowed a few ice chips by mouth. HOB up 30 degrees. Right upper chest (port) and abd (gtube drsg) both clean dry and intact. [facility] contacted and they will come out to see patient tomorrow regarding care of his feeding tube. Patient made aware of that and contact info given to patient. One vicodin given for pain with some relief - patient was ambulating to restroom, getting dressed during this time. States he feels better and ready to go home.  Few sips of water given and tolerated well. Abdominal binder on when patient up. All instructions reviewed with patient and son. Verbalize understanding. D/c home with son.

## 2014-04-02 NOTE — Procedures (Signed)
Successful RT IJ POWER PORT TIP SVC/RA NO COMP STABLE READY FOR USE  SUCCESSFUL 20 fr GTUBE INSERTION CONFIRMED IN THE STOMACH READY FOR USE TOMORROW

## 2014-04-03 DIAGNOSIS — C01 Malignant neoplasm of base of tongue: Secondary | ICD-10-CM | POA: Diagnosis not present

## 2014-04-03 DIAGNOSIS — F419 Anxiety disorder, unspecified: Secondary | ICD-10-CM | POA: Diagnosis not present

## 2014-04-03 DIAGNOSIS — I69392 Facial weakness following cerebral infarction: Secondary | ICD-10-CM | POA: Diagnosis not present

## 2014-04-03 DIAGNOSIS — K219 Gastro-esophageal reflux disease without esophagitis: Secondary | ICD-10-CM | POA: Diagnosis not present

## 2014-04-03 DIAGNOSIS — I10 Essential (primary) hypertension: Secondary | ICD-10-CM | POA: Diagnosis not present

## 2014-04-03 DIAGNOSIS — M1990 Unspecified osteoarthritis, unspecified site: Secondary | ICD-10-CM | POA: Diagnosis not present

## 2014-04-03 DIAGNOSIS — Z431 Encounter for attention to gastrostomy: Secondary | ICD-10-CM | POA: Diagnosis not present

## 2014-04-03 DIAGNOSIS — E785 Hyperlipidemia, unspecified: Secondary | ICD-10-CM | POA: Diagnosis not present

## 2014-04-03 DIAGNOSIS — F909 Attention-deficit hyperactivity disorder, unspecified type: Secondary | ICD-10-CM | POA: Diagnosis not present

## 2014-04-04 DIAGNOSIS — Z431 Encounter for attention to gastrostomy: Secondary | ICD-10-CM | POA: Diagnosis not present

## 2014-04-04 DIAGNOSIS — K219 Gastro-esophageal reflux disease without esophagitis: Secondary | ICD-10-CM | POA: Diagnosis not present

## 2014-04-04 DIAGNOSIS — C01 Malignant neoplasm of base of tongue: Secondary | ICD-10-CM | POA: Diagnosis not present

## 2014-04-04 DIAGNOSIS — I10 Essential (primary) hypertension: Secondary | ICD-10-CM | POA: Diagnosis not present

## 2014-04-04 DIAGNOSIS — F909 Attention-deficit hyperactivity disorder, unspecified type: Secondary | ICD-10-CM | POA: Diagnosis not present

## 2014-04-04 DIAGNOSIS — I69392 Facial weakness following cerebral infarction: Secondary | ICD-10-CM | POA: Diagnosis not present

## 2014-04-05 ENCOUNTER — Telehealth: Payer: Self-pay | Admitting: Hematology and Oncology

## 2014-04-05 ENCOUNTER — Other Ambulatory Visit: Payer: Self-pay | Admitting: Hematology and Oncology

## 2014-04-05 ENCOUNTER — Telehealth: Payer: Self-pay | Admitting: *Deleted

## 2014-04-05 ENCOUNTER — Ambulatory Visit (HOSPITAL_BASED_OUTPATIENT_CLINIC_OR_DEPARTMENT_OTHER): Payer: Medicare Other | Admitting: Hematology and Oncology

## 2014-04-05 VITALS — BP 149/83 | HR 78 | Temp 98.2°F | Resp 18 | Ht 73.0 in | Wt 190.0 lb

## 2014-04-05 DIAGNOSIS — G459 Transient cerebral ischemic attack, unspecified: Secondary | ICD-10-CM | POA: Diagnosis not present

## 2014-04-05 DIAGNOSIS — Z7982 Long term (current) use of aspirin: Secondary | ICD-10-CM

## 2014-04-05 DIAGNOSIS — C01 Malignant neoplasm of base of tongue: Secondary | ICD-10-CM | POA: Diagnosis not present

## 2014-04-05 DIAGNOSIS — I10 Essential (primary) hypertension: Secondary | ICD-10-CM | POA: Diagnosis not present

## 2014-04-05 DIAGNOSIS — F1020 Alcohol dependence, uncomplicated: Secondary | ICD-10-CM | POA: Diagnosis not present

## 2014-04-05 DIAGNOSIS — Z431 Encounter for attention to gastrostomy: Secondary | ICD-10-CM | POA: Diagnosis not present

## 2014-04-05 DIAGNOSIS — F909 Attention-deficit hyperactivity disorder, unspecified type: Secondary | ICD-10-CM | POA: Diagnosis not present

## 2014-04-05 DIAGNOSIS — I69392 Facial weakness following cerebral infarction: Secondary | ICD-10-CM | POA: Diagnosis not present

## 2014-04-05 DIAGNOSIS — K219 Gastro-esophageal reflux disease without esophagitis: Secondary | ICD-10-CM | POA: Diagnosis not present

## 2014-04-05 MED ORDER — LIDOCAINE-PRILOCAINE 2.5-2.5 % EX CREA: TOPICAL_CREAM | CUTANEOUS | Status: DC

## 2014-04-05 MED ORDER — PROCHLORPERAZINE MALEATE 10 MG PO TABS: 10.0000 mg | ORAL_TABLET | Freq: Four times a day (QID) | ORAL | Status: DC | PRN

## 2014-04-05 MED ORDER — ONDANSETRON HCL 8 MG PO TABS: 8.0000 mg | ORAL_TABLET | Freq: Three times a day (TID) | ORAL | Status: DC | PRN

## 2014-04-05 NOTE — Assessment & Plan Note (Signed)
We discussed the role of chemotherapy. The intent is for cure.  We discussed some of the risks, benefits, side-effects of cisplatin. Some of the short term side-effects included, though not limited to, including weight loss, life threatening infections, risk of allergic reactions, need for transfusions of blood products, nausea, vomiting, change in bowel habits, loss of hair, admission to hospital for various reasons, and risks of death.   Long term side-effects are also discussed including risks of infertility, permanent damage to nerve function, hearing loss, chronic fatigue, kidney damage with possibility needing hemodialysis, and rare secondary malignancy including bone marrow disorders.  The patient is aware that the response rates discussed earlier is not guaranteed.  After a long discussion, patient made an informed decision to proceed with the prescribed plan of care and went ahead to sign the consent form today.   Patient education material was dispensed. He will begin cycle one this week. I will see him on a weekly basis to provide supportive care.

## 2014-04-05 NOTE — Telephone Encounter (Signed)
Called pt about appt today.  He is in lobby waiting to register.

## 2014-04-05 NOTE — Patient Instructions (Signed)
Cisplatin injection  What is this medicine?  CISPLATIN (SIS pla tin) is a chemotherapy drug. It targets fast dividing cells, like cancer cells, and causes these cells to die. This medicine is used to treat many types of cancer like bladder, ovarian, and testicular cancers.  This medicine may be used for other purposes; ask your health care provider or pharmacist if you have questions.  COMMON BRAND NAME(S): Platinol, Platinol -AQ  What should I tell my health care provider before I take this medicine?  They need to know if you have any of these conditions:  -blood disorders  -hearing problems  -kidney disease  -recent or ongoing radiation therapy  -an unusual or allergic reaction to cisplatin, carboplatin, other chemotherapy, other medicines, foods, dyes, or preservatives  -pregnant or trying to get pregnant  -breast-feeding  How should I use this medicine?  This drug is given as an infusion into a vein. It is administered in a hospital or clinic by a specially trained health care professional.  Talk to your pediatrician regarding the use of this medicine in children. Special care may be needed.  Overdosage: If you think you have taken too much of this medicine contact a poison control center or emergency room at once.  NOTE: This medicine is only for you. Do not share this medicine with others.  What if I miss a dose?  It is important not to miss a dose. Call your doctor or health care professional if you are unable to keep an appointment.  What may interact with this medicine?  -dofetilide  -foscarnet  -medicines for seizures  -medicines to increase blood counts like filgrastim, pegfilgrastim, sargramostim  -probenecid  -pyridoxine used with altretamine  -rituximab  -some antibiotics like amikacin, gentamicin, neomycin, polymyxin B, streptomycin, tobramycin  -sulfinpyrazone  -vaccines  -zalcitabine  Talk to your doctor or health care professional before taking any of these  medicines:  -acetaminophen  -aspirin  -ibuprofen  -ketoprofen  -naproxen  This list may not describe all possible interactions. Give your health care provider a list of all the medicines, herbs, non-prescription drugs, or dietary supplements you use. Also tell them if you smoke, drink alcohol, or use illegal drugs. Some items may interact with your medicine.  What should I watch for while using this medicine?  Your condition will be monitored carefully while you are receiving this medicine. You will need important blood work done while you are taking this medicine.  This drug may make you feel generally unwell. This is not uncommon, as chemotherapy can affect healthy cells as well as cancer cells. Report any side effects. Continue your course of treatment even though you feel ill unless your doctor tells you to stop.  In some cases, you may be given additional medicines to help with side effects. Follow all directions for their use.  Call your doctor or health care professional for advice if you get a fever, chills or sore throat, or other symptoms of a cold or flu. Do not treat yourself. This drug decreases your body's ability to fight infections. Try to avoid being around people who are sick.  This medicine may increase your risk to bruise or bleed. Call your doctor or health care professional if you notice any unusual bleeding.  Be careful brushing and flossing your teeth or using a toothpick because you may get an infection or bleed more easily. If you have any dental work done, tell your dentist you are receiving this medicine.  Avoid taking products   that contain aspirin, acetaminophen, ibuprofen, naproxen, or ketoprofen unless instructed by your doctor. These medicines may hide a fever.  Do not become pregnant while taking this medicine. Women should inform their doctor if they wish to become pregnant or think they might be pregnant. There is a potential for serious side effects to an unborn child. Talk to  your health care professional or pharmacist for more information. Do not breast-feed an infant while taking this medicine.  Drink fluids as directed while you are taking this medicine. This will help protect your kidneys.  Call your doctor or health care professional if you get diarrhea. Do not treat yourself.  What side effects may I notice from receiving this medicine?  Side effects that you should report to your doctor or health care professional as soon as possible:  -allergic reactions like skin rash, itching or hives, swelling of the face, lips, or tongue  -signs of infection - fever or chills, cough, sore throat, pain or difficulty passing urine  -signs of decreased platelets or bleeding - bruising, pinpoint red spots on the skin, black, tarry stools, nosebleeds  -signs of decreased red blood cells - unusually weak or tired, fainting spells, lightheadedness  -breathing problems  -changes in hearing  -gout pain  -low blood counts - This drug may decrease the number of white blood cells, red blood cells and platelets. You may be at increased risk for infections and bleeding.  -nausea and vomiting  -pain, swelling, redness or irritation at the injection site  -pain, tingling, numbness in the hands or feet  -problems with balance, movement  -trouble passing urine or change in the amount of urine  Side effects that usually do not require medical attention (report to your doctor or health care professional if they continue or are bothersome):  -changes in vision  -loss of appetite  -metallic taste in the mouth or changes in taste  This list may not describe all possible side effects. Call your doctor for medical advice about side effects. You may report side effects to FDA at 1-800-FDA-1088.  Where should I keep my medicine?  This drug is given in a hospital or clinic and will not be stored at home.  NOTE: This sheet is a summary. It may not cover all possible information. If you have questions about this medicine,  talk to your doctor, pharmacist, or health care provider.   2015, Elsevier/Gold Standard. (2007-03-25 14:40:54)

## 2014-04-05 NOTE — Assessment & Plan Note (Signed)
I told him to discontinue lisinopril and continue to hold Plavix. He should remain on aspirin only for preventive measures.

## 2014-04-05 NOTE — Progress Notes (Signed)
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 He is seen today prior to initiation of chemotherapy. He tolerated recent port placement and feeding tube well. Denies any dysphagia. The patient had quit drinking altogether.  REVIEW OF SYSTEMS:   Constitutional: Denies fevers, chills or abnormal weight loss Eyes: Denies blurriness of vision Ears, nose, mouth, throat, and face: Denies mucositis or sore throat Respiratory: Denies cough, dyspnea or wheezes Cardiovascular: Denies palpitation, chest discomfort or lower extremity swelling Gastrointestinal:  Denies nausea, heartburn or change in bowel habits Skin: Denies abnormal skin rashes Lymphatics: Denies new lymphadenopathy or easy bruising Neurological:Denies numbness, tingling or new weaknesses Behavioral/Psych: Mood is stable, no new changes  All other systems were reviewed with the patient and are negative.  I have reviewed the past medical history, past surgical history, social history and family history with the patient and they are unchanged from previous note.  ALLERGIES:  has No Known Allergies.  MEDICATIONS:   Current Outpatient Prescriptions  Medication Sig Dispense Refill  . amphetamine-dextroamphetamine (ADDERALL) 20 MG tablet Take 20 mg by mouth 2 (two) times daily.    Marland Kitchen aspirin 81 MG tablet Take 81 mg by mouth daily.    Marland Kitchen atorvastatin (LIPITOR) 10 MG tablet Take 10 mg by mouth daily.    Marland Kitchen levothyroxine (SYNTHROID, LEVOTHROID) 75 MCG tablet Take 75 mcg by mouth daily before breakfast.     . lisinopril (PRINIVIL,ZESTRIL) 10 MG tablet Take 10 mg by mouth every morning.   3  . Multiple Vitamin (MULTIVITAMIN WITH MINERALS) TABS tablet Take 1 tablet by mouth daily.    . sodium fluoride (FLUORISHIELD) 1.1 % GEL dental gel Instill one drop of gel per tooth space of fluoride tray. Place over teeth for 5 minutes. Remove. Spit out excess. Repeat nightly. 120 mL prn  . lidocaine-prilocaine (EMLA) cream Apply to affected area once 30 g 3  . ondansetron (ZOFRAN) 8 MG tablet Take 1 tablet (8 mg total) by mouth every 8 (eight) hours as needed. 30 tablet 1  . prochlorperazine (COMPAZINE) 10 MG tablet Take 1 tablet (10 mg total) by mouth every 6 (six) hours as needed (Nausea or vomiting). 30 tablet 1   No current facility-administered medications for this visit.    PHYSICAL EXAMINATION: ECOG PERFORMANCE STATUS: 0 - Asymptomatic  Filed Vitals:   04/05/14 1206  BP: 149/83  Pulse: 78  Temp: 98.2 F (36.8 C)  Resp: 18   Filed Weights   04/05/14 1206  Weight: 190 lb (86.183 kg)    GENERAL:alert, no distress and comfortable SKIN: skin color, texture, turgor are normal, no rashes or significant lesions EYES: normal, Conjunctiva are pink and non-injected, sclera clear OROPHARYNX:no exudate, no erythema and lips, buccal  mucosa, and tongue normal  NECK: supple, thyroid normal size, non-tender, without nodularity LYMPH:  Persistent cervical lymphadenopathy. ABDOMEN:abdomen soft, non-tender and normal bowel sounds. Feeding tube site looks okay Musculoskeletal:no cyanosis of digits and no clubbing . Port  site looks okay NEURO: alert & oriented x 3 with fluent speech, no focal motor/sensory deficits  LABORATORY DATA:  I have reviewed the data as listed    Component Value Date/Time   NA 139 04/02/2014 1230   NA 139 03/26/2014 1116   K 4.2 04/02/2014 1230   K 4.6 03/26/2014 1116   CL 105 04/02/2014 1230   CO2 25 04/02/2014 1230   CO2 24 03/26/2014 1116   GLUCOSE 87 04/02/2014 1230   GLUCOSE 103 03/26/2014 1116   BUN 21 04/02/2014 1230   BUN 21.5 03/26/2014 1116   CREATININE 0.85 04/02/2014 1230   CREATININE 1.0 03/26/2014 1116   CALCIUM 9.6 04/02/2014 1230   CALCIUM 9.9 03/26/2014 1116   PROT 7.4 03/26/2014 1116   PROT 7.0 12/30/2012 0545   ALBUMIN 4.1 03/26/2014 1116   ALBUMIN 3.7 12/30/2012 0545   AST 24 03/26/2014 1116   AST 27 12/30/2012 0545   ALT 34 03/26/2014 1116   ALT 31 12/30/2012 0545   ALKPHOS 99 03/26/2014 1116   ALKPHOS 86 12/30/2012 0545   BILITOT 0.83 03/26/2014 1116   BILITOT 0.7 12/30/2012 0545   GFRNONAA 85* 04/02/2014 1230   GFRAA >90 04/02/2014 1230    No results found for: SPEP, UPEP  Lab Results  Component Value Date   WBC 6.7 04/02/2014   NEUTROABS 5.4 03/26/2014   HGB 15.0 04/02/2014   HCT 44.3 04/02/2014   MCV 89.3 04/02/2014   PLT 201 04/02/2014      Chemistry      Component Value Date/Time   NA 139 04/02/2014 1230   NA 139 03/26/2014 1116   K 4.2 04/02/2014 1230   K 4.6 03/26/2014 1116   CL 105 04/02/2014 1230   CO2 25 04/02/2014 1230   CO2 24 03/26/2014 1116   BUN 21 04/02/2014 1230   BUN 21.5 03/26/2014 1116   CREATININE 0.85 04/02/2014 1230   CREATININE 1.0 03/26/2014 1116      Component Value Date/Time   CALCIUM 9.6 04/02/2014 1230   CALCIUM 9.9 03/26/2014 1116   ALKPHOS 99 03/26/2014 1116   ALKPHOS 86 12/30/2012 0545   AST 24 03/26/2014 1116   AST 27 12/30/2012 0545   ALT 34 03/26/2014 1116   ALT 31 12/30/2012 0545   BILITOT 0.83 03/26/2014 1116   BILITOT 0.7 12/30/2012 0545     I reviewed the most recent PET  CT scan with him and his wife ASSESSMENT & PLAN:  Malignant neoplasm of base of tongue We discussed the role of chemotherapy. The intent is for cure.  We discussed some of the risks, benefits, side-effects of cisplatin. Some of the short term side-effects included, though not limited to, including weight loss, life threatening infections, risk of allergic reactions, need for transfusions of blood products, nausea, vomiting, change in bowel habits, loss of hair, admission to hospital for various reasons, and risks of death.   Long term side-effects are also discussed including risks of infertility, permanent damage to nerve function, hearing loss, chronic fatigue, kidney damage with possibility needing hemodialysis, and rare secondary malignancy including bone marrow disorders.  The patient is aware that the response rates discussed earlier is not guaranteed.  After a long discussion, patient made an informed decision to proceed with the  prescribed plan of care and went ahead to sign the consent form today.   Patient education material was dispensed. He will begin cycle one this week. I will see him on a weekly basis to provide supportive care.    Chronic alcoholism I congratulated his effort of recent cessation of alcohol intake    TIA (transient ischemic attack) I told him to discontinue lisinopril and continue to hold Plavix. He should remain on aspirin only for preventive measures.     No orders of the defined types were placed in this encounter.   All questions were answered. The patient knows to call the clinic with any problems, questions or concerns. No barriers to learning was detected. I spent 25 minutes counseling the patient face to face. The total time spent in the appointment was 30 minutes and more than 50% was on counseling and review of test results     [facility], [place], MD 04/05/2014 1:09 PM

## 2014-04-05 NOTE — Telephone Encounter (Signed)
gave and printed appt sched anda vs for pt for April and May °

## 2014-04-05 NOTE — Assessment & Plan Note (Signed)
I congratulated his effort of recent cessation of alcohol intake

## 2014-04-06 ENCOUNTER — Ambulatory Visit
Admission: RE | Admit: 2014-04-06 | Discharge: 2014-04-06 | Disposition: A | Discharge status: Home / Self Care | Payer: Medicare Other | Source: Ambulatory Visit | Attending: Radiation Oncology | Admitting: Radiation Oncology

## 2014-04-06 ENCOUNTER — Encounter: Payer: Self-pay | Admitting: *Deleted

## 2014-04-06 DIAGNOSIS — C01 Malignant neoplasm of base of tongue: Secondary | ICD-10-CM

## 2014-04-06 DIAGNOSIS — B370 Candidal stomatitis: Secondary | ICD-10-CM | POA: Diagnosis not present

## 2014-04-06 DIAGNOSIS — L598 Other specified disorders of the skin and subcutaneous tissue related to radiation: Secondary | ICD-10-CM | POA: Diagnosis not present

## 2014-04-06 DIAGNOSIS — Z510 Encounter for antineoplastic radiation therapy: Secondary | ICD-10-CM | POA: Diagnosis not present

## 2014-04-06 DIAGNOSIS — F411 Generalized anxiety disorder: Secondary | ICD-10-CM

## 2014-04-06 DIAGNOSIS — K1233 Oral mucositis (ulcerative) due to radiation: Secondary | ICD-10-CM | POA: Diagnosis not present

## 2014-04-06 MED ORDER — LORAZEPAM 0.5 MG PO TABS
0.5000 mg | ORAL_TABLET | Freq: Once | ORAL | Status: AC
  Administered 2014-04-06: 0.5 mg via ORAL
  Filled 2014-04-06: qty 1

## 2014-04-06 MED ORDER — LORAZEPAM 0.5 MG PO TABS: ORAL_TABLET | ORAL | Status: DC

## 2014-04-06 MED ORDER — RADIAPLEXRX EX GEL
Freq: Once | CUTANEOUS | Status: AC
Start: 2014-04-06 — End: 2014-04-06
  Administered 2014-04-06: 15:00:00 via TOPICAL

## 2014-04-06 NOTE — Progress Notes (Signed)
To provide support and encouragement, care continuity and to assess for needs, met with patient during [place].  He required Ativan 0.5 mg SL to complete treatment, tolerated procedure exceptionally well in light of extreme difficulty prior to medication.  I provided a pair of mesh briefs to be modified/used for supporting PEG tube.  I provided an Epic schedule with current appts.  Gayleen Orem, RN, BSN, [place] at [place] [phone]

## 2014-04-06 NOTE — Progress Notes (Signed)
Per Radiation Therapist on Tomo, patient tolerated treatment well.

## 2014-04-06 NOTE — Progress Notes (Signed)
IMRT Device Note    ICD-9-CM ICD-10-CM   1. Malignant neoplasm of base of tongue 141.0 C01 LORazepam (ATIVAN) 0.5 MG tablet     LORazepam (ATIVAN) tablet 0.5 mg  2. Anxiety state 300.00 F41.1 LORazepam (ATIVAN) tablet 0.5 mg    9.8 delivered field widths represent one set of IMRT treatment devices. The code is [phone].  -----------------------------------  Eppie Gibson, MD

## 2014-04-06 NOTE — Addendum Note (Signed)
Encounter addended by: Minna Antis, RN on: 04/06/2014  4:58 PM<BR>     Documentation filed: Notes Section

## 2014-04-06 NOTE — Progress Notes (Signed)
Patient unable to tolerate mask for treatment. Per Dr Isidore Moos, Sam, RN gave patient Ativan 0.5 mg SL.  Patient given prescription for Ativan to take prior to treatments. Rick escorted pt to [facility] for tx at 4:35 pm.

## 2014-04-06 NOTE — Progress Notes (Signed)
Patient education completed with patient and his wife. Gave him "Radiation and You" booklet with all pertinent information marked and discussed, re: fatigue, hair loss/care, mouth irritation/management, nausea/ management, skin irritation/care, throat irritation/care, nutrition, pain. Gave pt Radiaplex with instructions for proper use. Teach back method used. Pt and wife verbalized understanding.

## 2014-04-07 ENCOUNTER — Encounter: Payer: Self-pay | Admitting: *Deleted

## 2014-04-07 ENCOUNTER — Ambulatory Visit (HOSPITAL_BASED_OUTPATIENT_CLINIC_OR_DEPARTMENT_OTHER): Payer: Medicare Other

## 2014-04-07 ENCOUNTER — Ambulatory Visit
Admission: RE | Admit: 2014-04-07 | Discharge: 2014-04-07 | Disposition: A | Discharge status: Home / Self Care | Payer: Medicare Other | Source: Ambulatory Visit | Attending: Radiation Oncology | Admitting: Radiation Oncology

## 2014-04-07 DIAGNOSIS — Z510 Encounter for antineoplastic radiation therapy: Secondary | ICD-10-CM | POA: Diagnosis not present

## 2014-04-07 DIAGNOSIS — K1233 Oral mucositis (ulcerative) due to radiation: Secondary | ICD-10-CM | POA: Diagnosis not present

## 2014-04-07 DIAGNOSIS — Z5111 Encounter for antineoplastic chemotherapy: Secondary | ICD-10-CM

## 2014-04-07 DIAGNOSIS — F411 Generalized anxiety disorder: Secondary | ICD-10-CM | POA: Diagnosis not present

## 2014-04-07 DIAGNOSIS — C01 Malignant neoplasm of base of tongue: Secondary | ICD-10-CM

## 2014-04-07 DIAGNOSIS — B370 Candidal stomatitis: Secondary | ICD-10-CM | POA: Diagnosis not present

## 2014-04-07 DIAGNOSIS — L598 Other specified disorders of the skin and subcutaneous tissue related to radiation: Secondary | ICD-10-CM | POA: Diagnosis not present

## 2014-04-07 MED ORDER — SODIUM CHLORIDE 0.9 % IV SOLN
96.0000 mg/m2 | Freq: Once | INTRAVENOUS | Status: AC
  Administered 2014-04-07: 200 mg via INTRAVENOUS
  Filled 2014-04-07: qty 200

## 2014-04-07 MED ORDER — POTASSIUM CHLORIDE 2 MEQ/ML IV SOLN
Freq: Once | INTRAVENOUS | Status: AC
  Administered 2014-04-07: 09:00:00 via INTRAVENOUS
  Filled 2014-04-07: qty 10

## 2014-04-07 MED ORDER — PALONOSETRON HCL INJECTION 0.25 MG/5ML
INTRAVENOUS | Status: AC
  Filled 2014-04-07: qty 5

## 2014-04-07 MED ORDER — SODIUM CHLORIDE 0.9 % IJ SOLN
10.0000 mL | INTRAMUSCULAR | Status: DC | PRN
  Administered 2014-04-07: 10 mL
  Filled 2014-04-07: qty 10

## 2014-04-07 MED ORDER — SODIUM CHLORIDE 0.9 % IV SOLN
Freq: Once | INTRAVENOUS | Status: AC
  Administered 2014-04-07: 12:00:00 via INTRAVENOUS
  Filled 2014-04-07: qty 5

## 2014-04-07 MED ORDER — HEPARIN SOD (PORK) LOCK FLUSH 100 UNIT/ML IV SOLN
500.0000 [IU] | Freq: Once | INTRAVENOUS | Status: AC | PRN
  Administered 2014-04-07: 500 [IU]
  Filled 2014-04-07: qty 5

## 2014-04-07 MED ORDER — PALONOSETRON HCL INJECTION 0.25 MG/5ML
0.2500 mg | Freq: Once | INTRAVENOUS | Status: AC
  Administered 2014-04-07: 0.25 mg via INTRAVENOUS

## 2014-04-07 NOTE — Patient Instructions (Signed)
[facility] Discharge Instructions for Patients Receiving Chemotherapy  Today you received the following chemotherapy agents Cisplatin  To help prevent nausea and vomiting after your treatment, we encourage you to take your nausea medication as directed/prescribed   If you develop nausea and vomiting that is not controlled by your nausea medication, call the clinic.   BELOW ARE SYMPTOMS THAT SHOULD BE REPORTED IMMEDIATELY:  *FEVER GREATER THAN 100.5 F  *CHILLS WITH OR WITHOUT FEVER  NAUSEA AND VOMITING THAT IS NOT CONTROLLED WITH YOUR NAUSEA MEDICATION  *UNUSUAL SHORTNESS OF BREATH  *UNUSUAL BRUISING OR BLEEDING  TENDERNESS IN MOUTH AND THROAT WITH OR WITHOUT PRESENCE OF ULCERS  *URINARY PROBLEMS  *BOWEL PROBLEMS  UNUSUAL RASH Items with * indicate a potential emergency and should be followed up as soon as possible.  Feel free to call the clinic you have any questions or concerns. The clinic phone number is [phone].  Please show the [place] at check-in to the Emergency Department and triage nurse.

## 2014-04-08 ENCOUNTER — Ambulatory Visit
Admission: RE | Admit: 2014-04-08 | Discharge: 2014-04-08 | Disposition: A | Discharge status: Home / Self Care | Payer: Medicare Other | Source: Ambulatory Visit | Attending: Radiation Oncology | Admitting: Radiation Oncology

## 2014-04-08 ENCOUNTER — Encounter: Payer: Self-pay | Admitting: *Deleted

## 2014-04-08 DIAGNOSIS — B370 Candidal stomatitis: Secondary | ICD-10-CM | POA: Diagnosis not present

## 2014-04-08 DIAGNOSIS — C01 Malignant neoplasm of base of tongue: Secondary | ICD-10-CM | POA: Diagnosis not present

## 2014-04-08 DIAGNOSIS — L598 Other specified disorders of the skin and subcutaneous tissue related to radiation: Secondary | ICD-10-CM | POA: Diagnosis not present

## 2014-04-08 DIAGNOSIS — K1233 Oral mucositis (ulcerative) due to radiation: Secondary | ICD-10-CM | POA: Diagnosis not present

## 2014-04-08 DIAGNOSIS — Z510 Encounter for antineoplastic radiation therapy: Secondary | ICD-10-CM | POA: Diagnosis not present

## 2014-04-08 DIAGNOSIS — F411 Generalized anxiety disorder: Secondary | ICD-10-CM | POA: Diagnosis not present

## 2014-04-08 NOTE — Progress Notes (Signed)
To provide support and encouragement, met with patient during today's Tom[place].  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 Patient tolerated tmt without incident with aid of 1 tab 0.5 mg Ativan.  Gayleen Orem, RN, BSN, [place] at [place] [phone]

## 2014-04-08 NOTE — Progress Notes (Signed)
Met with patient after his Tomo tmt.  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 1. He tolerated tmt without incident with aid of 1 tablet 0.5 mg Ativan. 2. He recognized that he was getting more comfortable with tmt, stated he may try tmt tomorrow without medication. 3. He stated he has not started using Biafine.  I explained the benefits of this ointment, encouraged him to apply 2-3 times daily, reminded him not to apply 4 hrs prior to RT.  He and Debbie verbalized understanding. 4. He understands I can be contacted with concerns or needs.  Gayleen Orem, RN, BSN, [place] at [place] [phone]

## 2014-04-09 ENCOUNTER — Ambulatory Visit
Admission: RE | Admit: 2014-04-09 | Discharge: 2014-04-09 | Disposition: A | Discharge status: Home / Self Care | Payer: Medicare Other | Source: Ambulatory Visit | Attending: Radiation Oncology | Admitting: Radiation Oncology

## 2014-04-09 DIAGNOSIS — B370 Candidal stomatitis: Secondary | ICD-10-CM | POA: Diagnosis not present

## 2014-04-09 DIAGNOSIS — K1233 Oral mucositis (ulcerative) due to radiation: Secondary | ICD-10-CM | POA: Diagnosis not present

## 2014-04-09 DIAGNOSIS — Z510 Encounter for antineoplastic radiation therapy: Secondary | ICD-10-CM | POA: Diagnosis not present

## 2014-04-09 DIAGNOSIS — C01 Malignant neoplasm of base of tongue: Secondary | ICD-10-CM | POA: Diagnosis not present

## 2014-04-09 DIAGNOSIS — L598 Other specified disorders of the skin and subcutaneous tissue related to radiation: Secondary | ICD-10-CM | POA: Diagnosis not present

## 2014-04-09 DIAGNOSIS — F411 Generalized anxiety disorder: Secondary | ICD-10-CM | POA: Diagnosis not present

## 2014-04-12 ENCOUNTER — Encounter: Payer: Self-pay | Admitting: Radiation Oncology

## 2014-04-12 ENCOUNTER — Ambulatory Visit
Admission: RE | Admit: 2014-04-12 | Discharge: 2014-04-12 | Disposition: A | Discharge status: Home / Self Care | Payer: Medicare Other | Source: Ambulatory Visit | Attending: Radiation Oncology | Admitting: Radiation Oncology

## 2014-04-12 VITALS — BP 127/82 | HR 81 | Temp 97.7°F | Resp 20 | Wt 190.2 lb

## 2014-04-12 DIAGNOSIS — B370 Candidal stomatitis: Secondary | ICD-10-CM | POA: Diagnosis not present

## 2014-04-12 DIAGNOSIS — K1233 Oral mucositis (ulcerative) due to radiation: Secondary | ICD-10-CM | POA: Diagnosis not present

## 2014-04-12 DIAGNOSIS — C01 Malignant neoplasm of base of tongue: Secondary | ICD-10-CM | POA: Diagnosis not present

## 2014-04-12 DIAGNOSIS — Z510 Encounter for antineoplastic radiation therapy: Secondary | ICD-10-CM | POA: Diagnosis not present

## 2014-04-12 DIAGNOSIS — F411 Generalized anxiety disorder: Secondary | ICD-10-CM | POA: Diagnosis not present

## 2014-04-12 DIAGNOSIS — L598 Other specified disorders of the skin and subcutaneous tissue related to radiation: Secondary | ICD-10-CM | POA: Diagnosis not present

## 2014-04-12 MED ORDER — RANITIDINE HCL 150 MG/10ML PO SYRP: 150.0000 mg | ORAL_SOLUTION | Freq: Two times a day (BID) | ORAL | Status: DC

## 2014-04-12 MED ORDER — SUCRALFATE 1 G PO TABS: ORAL_TABLET | ORAL | Status: DC

## 2014-04-12 MED ORDER — LIDOCAINE VISCOUS 2 % MT SOLN: OROMUCOSAL | Status: DC

## 2014-04-12 NOTE — Progress Notes (Signed)
Patient denies pain, loss of appetite. He states last night he had "very bad indigestion". He took Tums. Informed him this would not be caused by his radiation treatments. He states he has had slight nausea as well, alternated taking Zofran, Compazine with good relief. His wife states he is "sleeping a lot". Suggested this may be caused by taking Ativan. Patient applying lotion to neck treatment area, no skin changes at this time. BP 127/82 mmHg  Pulse 81  Temp(Src) 97.7 F (36.5 C) (Oral)  Resp 20  Wt 190 lb 3.2 oz (86.274 kg)

## 2014-04-12 NOTE — Progress Notes (Signed)
Weekly Management Note:  Outpatient    ICD-9-CM ICD-10-CM   1. Malignant neoplasm of base of tongue 141.0 C01 ranitidine (ZANTAC) 150 MG/10ML syrup     sucralfate (CARAFATE) 1 G tablet     lidocaine (XYLOCAINE) 2 % solution    Current Dose:  10 Gy  Projected Dose: 70 Gy   Narrative:  The patient presents for routine under treatment assessment.  CBCT/MVCT images/Port film x-rays were reviewed.  The chart was checked. Patient denies pain, loss of appetite. He states last night he had "very bad indigestion". He took Tums. He states he has had slight nausea as well, alternated taking Zofran, Compazine with good relief. His wife states he is "sleeping a lot". BP 127/82 mmHg  Pulse 81  Temp(Src) 97.7 F (36.5 C) (Oral)  Resp 20  Wt 190 lb 3.2 oz (86.274 kg)    Physical Findings:  Wt Readings from Last 3 Encounters:  04/05/14 190 lb (86.183 kg)  03/17/14 185 lb (83.915 kg)  08/04/13 186 lb 4 oz (84.482 kg)    weight is 190 lb 3.2 oz (86.274 kg). His oral temperature is 97.7 F (36.5 C). His blood pressure is 127/82 and his pulse is 81. His respiration is 20.   No mucositis, thrush, or skin changes. Palpable left upper neck mass  CBC    Component Value Date/Time   WBC 6.7 04/02/2014 1230   WBC 7.3 03/26/2014 1117   RBC 4.96 04/02/2014 1230   RBC 5.49 03/26/2014 1117   HGB 15.0 04/02/2014 1230   HGB 16.1 03/26/2014 1117   HCT 44.3 04/02/2014 1230   HCT 48.8 03/26/2014 1117   PLT 201 04/02/2014 1230   PLT 220 03/26/2014 1117   MCV 89.3 04/02/2014 1230   MCV 88.9 03/26/2014 1117   MCH 30.2 04/02/2014 1230   MCH 29.3 03/26/2014 1117   MCHC 33.9 04/02/2014 1230   MCHC 33.0 03/26/2014 1117   RDW 13.0 04/02/2014 1230   RDW 12.9 03/26/2014 1117   LYMPHSABS 1.0 03/26/2014 1117   LYMPHSABS 1.3 12/30/2012 0545   MONOABS 0.7 03/26/2014 1117   MONOABS 0.4 12/30/2012 0545   EOSABS 0.1 03/26/2014 1117   EOSABS 0.2 12/30/2012 0545   BASOSABS 0.0 03/26/2014 1117   BASOSABS 0.0  12/30/2012 0545     CMP     Component Value Date/Time   NA 139 04/02/2014 1230   NA 139 03/26/2014 1116   K 4.2 04/02/2014 1230   K 4.6 03/26/2014 1116   CL 105 04/02/2014 1230   CO2 25 04/02/2014 1230   CO2 24 03/26/2014 1116   GLUCOSE 87 04/02/2014 1230   GLUCOSE 103 03/26/2014 1116   BUN 21 04/02/2014 1230   BUN 21.5 03/26/2014 1116   CREATININE 0.85 04/02/2014 1230   CREATININE 1.0 03/26/2014 1116   CALCIUM 9.6 04/02/2014 1230   CALCIUM 9.9 03/26/2014 1116   PROT 7.4 03/26/2014 1116   PROT 7.0 12/30/2012 0545   ALBUMIN 4.1 03/26/2014 1116   ALBUMIN 3.7 12/30/2012 0545   AST 24 03/26/2014 1116   AST 27 12/30/2012 0545   ALT 34 03/26/2014 1116   ALT 31 12/30/2012 0545   ALKPHOS 99 03/26/2014 1116   ALKPHOS 86 12/30/2012 0545   BILITOT 0.83 03/26/2014 1116   BILITOT 0.7 12/30/2012 0545   GFRNONAA 85* 04/02/2014 1230   GFRAA >90 04/02/2014 1230     Impression:  The patient is tolerating radiotherapy.   Plan:  Continue radiotherapy as planned.  Rx Zantac for  indigestion, possible r/t chemotherapy or PEG Rx lidocaine and sucralfate for future mucositis Baking soda gargle prn   -----------------------------------  Eppie Gibson, MD

## 2014-04-13 ENCOUNTER — Other Ambulatory Visit: Payer: Self-pay | Admitting: Hematology and Oncology

## 2014-04-13 ENCOUNTER — Ambulatory Visit
Admission: RE | Admit: 2014-04-13 | Discharge: 2014-04-13 | Disposition: A | Discharge status: Home / Self Care | Payer: Medicare Other | Source: Ambulatory Visit | Attending: Radiation Oncology | Admitting: Radiation Oncology

## 2014-04-13 DIAGNOSIS — B370 Candidal stomatitis: Secondary | ICD-10-CM | POA: Diagnosis not present

## 2014-04-13 DIAGNOSIS — L598 Other specified disorders of the skin and subcutaneous tissue related to radiation: Secondary | ICD-10-CM | POA: Diagnosis not present

## 2014-04-13 DIAGNOSIS — C01 Malignant neoplasm of base of tongue: Secondary | ICD-10-CM | POA: Diagnosis not present

## 2014-04-13 DIAGNOSIS — Z510 Encounter for antineoplastic radiation therapy: Secondary | ICD-10-CM | POA: Diagnosis not present

## 2014-04-13 DIAGNOSIS — K1233 Oral mucositis (ulcerative) due to radiation: Secondary | ICD-10-CM | POA: Diagnosis not present

## 2014-04-13 DIAGNOSIS — F411 Generalized anxiety disorder: Secondary | ICD-10-CM | POA: Diagnosis not present

## 2014-04-14 ENCOUNTER — Ambulatory Visit
Admission: RE | Admit: 2014-04-14 | Discharge: 2014-04-14 | Disposition: A | Discharge status: Home / Self Care | Payer: Medicare Other | Source: Ambulatory Visit | Attending: Radiation Oncology | Admitting: Radiation Oncology

## 2014-04-14 ENCOUNTER — Ambulatory Visit: Payer: Medicare Other | Admitting: Nutrition

## 2014-04-14 ENCOUNTER — Encounter: Payer: Self-pay | Admitting: Hematology and Oncology

## 2014-04-14 ENCOUNTER — Telehealth: Payer: Self-pay | Admitting: *Deleted

## 2014-04-14 ENCOUNTER — Ambulatory Visit (HOSPITAL_BASED_OUTPATIENT_CLINIC_OR_DEPARTMENT_OTHER): Payer: Medicare Other | Admitting: Hematology and Oncology

## 2014-04-14 ENCOUNTER — Telehealth: Payer: Self-pay | Admitting: Hematology and Oncology

## 2014-04-14 DIAGNOSIS — F411 Generalized anxiety disorder: Secondary | ICD-10-CM | POA: Diagnosis not present

## 2014-04-14 DIAGNOSIS — R110 Nausea: Secondary | ICD-10-CM | POA: Diagnosis not present

## 2014-04-14 DIAGNOSIS — T451X5A Adverse effect of antineoplastic and immunosuppressive drugs, initial encounter: Secondary | ICD-10-CM

## 2014-04-14 DIAGNOSIS — C01 Malignant neoplasm of base of tongue: Secondary | ICD-10-CM | POA: Diagnosis not present

## 2014-04-14 DIAGNOSIS — R634 Abnormal weight loss: Secondary | ICD-10-CM

## 2014-04-14 DIAGNOSIS — L598 Other specified disorders of the skin and subcutaneous tissue related to radiation: Secondary | ICD-10-CM | POA: Diagnosis not present

## 2014-04-14 DIAGNOSIS — K1233 Oral mucositis (ulcerative) due to radiation: Secondary | ICD-10-CM | POA: Diagnosis not present

## 2014-04-14 DIAGNOSIS — B370 Candidal stomatitis: Secondary | ICD-10-CM | POA: Diagnosis not present

## 2014-04-14 DIAGNOSIS — Z510 Encounter for antineoplastic radiation therapy: Secondary | ICD-10-CM | POA: Diagnosis not present

## 2014-04-14 NOTE — Assessment & Plan Note (Signed)
So far, he has very mild side effects from treatment. He will continue weekly supportive care visit. I encouraged him to increase oral fluid intake

## 2014-04-14 NOTE — Assessment & Plan Note (Signed)
This is related to recent treatment. I recommend anti-emetics as needed.

## 2014-04-14 NOTE — Progress Notes (Signed)
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 Patient reports he continues to eat well at this time. He has had issues with indigestion.  He now takes Zantac. He also has had some trouble with constipation but is taking MiraLAX. Weight has increased and was documented as 190.2 pounds April 11, up from 185 pounds March 16. Patient reports he has not been doing his swallowing exercises as educated.  Nutrition diagnosis: Predicted sub-optimal energy intake continues.  Intervention: Patient was educated to continue smaller frequent meals and snacks utilizing high protein foods. Stressed the importance of patient compliance with swallowing exercises. Encouraged patient to consume Ensure Plus or boost plus once daily.  Provided samples. Questions were answered.  Teach back method used.  Monitoring, evaluation, goals:  Patient has been able to tolerate adequate calories and protein for minimal weight loss.  Tube feedings will be initiated when patient experiences 5% weight loss from usual body weight.  Next visit: Tuesday, April 19 after radiation therapy.  **Disclaimer: This note was dictated with voice recognition software. Similar sounding words can inadvertently be transcribed and this note may contain transcription errors which may not have been corrected upon publication of note.**

## 2014-04-14 NOTE — Telephone Encounter (Signed)
Pt confirmed labs/ov per 04/13 POF, gave pt AVS and Calendar.... KJ, sent msg to add chemo °

## 2014-04-14 NOTE — Progress Notes (Signed)
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 He is seen today as part of his weekly supportive care visit. He has had recent nausea but no vomiting. Has some mild memory loss. He has also taste sensation and with mild weight loss  REVIEW OF SYSTEMS:   Constitutional: Denies fevers, chills Eyes: Denies blurriness of vision Ears, nose, mouth, throat, and face: Denies mucositis or sore throat Respiratory: Denies cough, dyspnea or wheezes Cardiovascular: Denies palpitation, chest discomfort or lower extremity swelling Skin: Denies abnormal skin rashes Lymphatics: Denies new lymphadenopathy or easy bruising Neurological:Denies numbness, tingling or new weaknesses Behavioral/Psych: Mood is stable, no new changes  All other systems were reviewed with the patient and are negative.  I have reviewed the past medical history, past surgical history, social history and family history with the patient and they are unchanged from previous note.  ALLERGIES:  has No  Known Allergies.  MEDICATIONS:  Current Outpatient Prescriptions  Medication Sig Dispense Refill  . amphetamine-dextroamphetamine (ADDERALL) 20 MG tablet Take 20 mg by mouth 2 (two) times daily.    Marland Kitchen aspirin 81 MG tablet Take 81 mg by mouth daily.    Marland Kitchen atorvastatin (LIPITOR) 10 MG tablet Take 10 mg by mouth daily.    Marland Kitchen levothyroxine (SYNTHROID, LEVOTHROID) 75 MCG tablet Take 75 mcg by mouth daily before breakfast.     . lidocaine (XYLOCAINE) 2 % solution Patient: Mix 1part 2% viscous lidocaine, 1part H20. Swish and/or swallow 28mL of this mixture, 30min before meals and at bedtime, up to QID 100 mL 5  . lidocaine-prilocaine (EMLA) cream Apply to affected area once 30 g 3  . LORazepam (ATIVAN) 0.5 MG tablet Take 1 tablet approximately 30 min prior to radiotherapy for anxiety/claustrophobia 40 tablet 0  . Multiple Vitamin (MULTIVITAMIN WITH MINERALS) TABS tablet Take 1 tablet by mouth daily.    . ondansetron (ZOFRAN) 8 MG tablet Take 1 tablet (8 mg total) by mouth every 8 (eight) hours as needed. 30 tablet 1  . prochlorperazine (COMPAZINE) 10 MG tablet Take 1 tablet (10 mg total) by mouth every 6 (six) hours as needed (Nausea or vomiting). 30 tablet 1  . ranitidine (ZANTAC) 150 MG/10ML syrup Take 10 mLs (150 mg total) by mouth 2 (two) times daily. May flush into PEG tube. To prevent indigestion. 300 mL 3  . sodium fluoride (FLUORISHIELD) 1.1 % GEL dental gel Instill one drop of gel per tooth space of fluoride tray. Place over teeth for 5 minutes. Remove. Spit out excess. Repeat nightly. 120 mL prn  . sucralfate (CARAFATE) 1 G tablet Dissolve  1 tablet in 10 mL H20 and swallow QID PRN sore throat. 40 tablet 5  . Wound Dressings (RADIAGEL) GEL Apply topically 2 (two) times daily.     No current facility-administered medications for this visit.    PHYSICAL EXAMINATION: ECOG PERFORMANCE STATUS: 0 - Asymptomatic GENERAL:alert, no distress and comfortable SKIN: skin color, texture, turgor are  normal, no rashes or significant lesions EYES: normal, Conjunctiva are pink and non-injected, sclera clear OROPHARYNX:no exudate, no erythema and lips, buccal mucosa, and tongue normal  NECK: supple, thyroid normal size, non-tender, without nodularity LYMPH: Persistent palpable lymphadenopathy on both sides of the neck, unchanged LUNGS: clear to auscultation and percussion with normal breathing effort HEART: regular rate & rhythm and no murmurs and no lower extremity edema ABDOMEN:abdomen soft, non-tender and normal bowel sounds. Feeding tube site looks okay Musculoskeletal:no cyanosis of digits and no clubbing  NEURO: alert & oriented x 3 with fluent speech, no focal motor/sensory deficits  LABORATORY DATA:  I have reviewed the data as listed    Component Value Date/Time   NA 139 04/02/2014 1230   NA 139 03/26/2014 1116   K 4.2 04/02/2014 1230   K 4.6 03/26/2014 1116   CL 105 04/02/2014 1230   CO2 25 04/02/2014 1230   CO2 24 03/26/2014 1116   GLUCOSE 87 04/02/2014 1230   GLUCOSE 103 03/26/2014 1116   BUN 21 04/02/2014 1230   BUN 21.5 03/26/2014 1116   CREATININE 0.85 04/02/2014 1230   CREATININE 1.0 03/26/2014 1116   CALCIUM 9.6 04/02/2014 1230   CALCIUM 9.9 03/26/2014 1116   PROT 7.4 03/26/2014 1116   PROT 7.0 12/30/2012 0545   ALBUMIN 4.1 03/26/2014 1116   ALBUMIN 3.7 12/30/2012 0545   AST 24 03/26/2014 1116   AST 27 12/30/2012 0545   ALT 34 03/26/2014 1116   ALT 31 12/30/2012 0545   ALKPHOS 99 03/26/2014 1116   ALKPHOS 86 12/30/2012 0545   BILITOT 0.83 03/26/2014 1116   BILITOT 0.7 12/30/2012 0545   GFRNONAA 85* 04/02/2014 1230   GFRAA >90 04/02/2014 1230    No results found for: SPEP, UPEP  Lab Results  Component Value Date   WBC 6.7 04/02/2014   NEUTROABS 5.4 03/26/2014   HGB 15.0 04/02/2014   HCT 44.3 04/02/2014   MCV 89.3 04/02/2014   PLT 201 04/02/2014      Chemistry      Component Value Date/Time   NA 139 04/02/2014 1230   NA 139 03/26/2014 1116    K 4.2 04/02/2014 1230   K 4.6 03/26/2014 1116   CL 105 04/02/2014 1230   CO2 25 04/02/2014 1230   CO2 24 03/26/2014 1116   BUN 21 04/02/2014 1230   BUN 21.5 03/26/2014 1116   CREATININE 0.85 04/02/2014 1230   CREATININE 1.0 03/26/2014 1116      Component Value Date/Time   CALCIUM 9.6 04/02/2014 1230   CALCIUM 9.9 03/26/2014 1116   ALKPHOS 99 03/26/2014 1116   ALKPHOS 86 12/30/2012 0545   AST 24 03/26/2014 1116   AST 27 12/30/2012 0545   ALT 34 03/26/2014 1116   ALT 31 12/30/2012 0545   BILITOT 0.83 03/26/2014 1116   BILITOT 0.7 12/30/2012 0545      ASSESSMENT & PLAN:  Malignant neoplasm of base of tongue So far, he has very mild side effects from treatment. He will continue weekly supportive care visit. I encouraged him to increase oral fluid intake   Weight loss He has mild weight loss which I  suspect is due to recent nausea. I encouraged him to increase oral fluid intake. He is seen by dietitian today   Nausea without vomiting This is related to recent treatment. I recommend anti-emetics as needed.    No orders of the defined types were placed in this encounter.   All questions were answered. The patient knows to call the clinic with any problems, questions or concerns. No barriers to learning was detected. I spent 15 minutes counseling the patient face to face. The total time spent in the appointment was 20 minutes and more than 50% was on counseling and review of test results     [facility], [place], MD 04/14/2014 12:02 PM

## 2014-04-14 NOTE — Telephone Encounter (Signed)
Per staff message and POF I have scheduled appts. Advised scheduler of appts. JMW Per staff message and POF I have scheduled appts. Advised scheduler of appts. JMW  

## 2014-04-14 NOTE — Assessment & Plan Note (Signed)
He has mild weight loss which I suspect is due to recent nausea. I encouraged him to increase oral fluid intake. He is seen by dietitian today

## 2014-04-15 ENCOUNTER — Ambulatory Visit
Admission: RE | Admit: 2014-04-15 | Discharge: 2014-04-15 | Disposition: A | Discharge status: Home / Self Care | Payer: Medicare Other | Source: Ambulatory Visit | Attending: Radiation Oncology | Admitting: Radiation Oncology

## 2014-04-15 DIAGNOSIS — C01 Malignant neoplasm of base of tongue: Secondary | ICD-10-CM | POA: Diagnosis not present

## 2014-04-15 DIAGNOSIS — F411 Generalized anxiety disorder: Secondary | ICD-10-CM | POA: Diagnosis not present

## 2014-04-15 DIAGNOSIS — B370 Candidal stomatitis: Secondary | ICD-10-CM | POA: Diagnosis not present

## 2014-04-15 DIAGNOSIS — Z510 Encounter for antineoplastic radiation therapy: Secondary | ICD-10-CM | POA: Diagnosis not present

## 2014-04-15 DIAGNOSIS — K1233 Oral mucositis (ulcerative) due to radiation: Secondary | ICD-10-CM | POA: Diagnosis not present

## 2014-04-15 DIAGNOSIS — L598 Other specified disorders of the skin and subcutaneous tissue related to radiation: Secondary | ICD-10-CM | POA: Diagnosis not present

## 2014-04-16 ENCOUNTER — Encounter: Payer: Self-pay | Admitting: *Deleted

## 2014-04-16 ENCOUNTER — Ambulatory Visit
Admission: RE | Admit: 2014-04-16 | Discharge: 2014-04-16 | Disposition: A | Discharge status: Home / Self Care | Payer: Medicare Other | Source: Ambulatory Visit | Attending: Radiation Oncology | Admitting: Radiation Oncology

## 2014-04-16 DIAGNOSIS — F411 Generalized anxiety disorder: Secondary | ICD-10-CM | POA: Diagnosis not present

## 2014-04-16 DIAGNOSIS — L598 Other specified disorders of the skin and subcutaneous tissue related to radiation: Secondary | ICD-10-CM | POA: Diagnosis not present

## 2014-04-16 DIAGNOSIS — Z510 Encounter for antineoplastic radiation therapy: Secondary | ICD-10-CM | POA: Diagnosis not present

## 2014-04-16 DIAGNOSIS — C01 Malignant neoplasm of base of tongue: Secondary | ICD-10-CM | POA: Diagnosis not present

## 2014-04-16 DIAGNOSIS — B370 Candidal stomatitis: Secondary | ICD-10-CM | POA: Diagnosis not present

## 2014-04-16 DIAGNOSIS — K1233 Oral mucositis (ulcerative) due to radiation: Secondary | ICD-10-CM | POA: Diagnosis not present

## 2014-04-19 ENCOUNTER — Ambulatory Visit
Admission: RE | Admit: 2014-04-19 | Discharge: 2014-04-19 | Disposition: A | Discharge status: Home / Self Care | Payer: Medicare Other | Source: Ambulatory Visit | Attending: Radiation Oncology | Admitting: Radiation Oncology

## 2014-04-19 ENCOUNTER — Telehealth: Payer: Self-pay | Admitting: *Deleted

## 2014-04-19 ENCOUNTER — Ambulatory Visit: Payer: Medicare Other

## 2014-04-19 ENCOUNTER — Encounter: Payer: Self-pay | Admitting: Radiation Oncology

## 2014-04-19 ENCOUNTER — Ambulatory Visit
Admission: RE | Admit: 2014-04-19 | Discharge: 2014-04-19 | Disposition: A | Discharge status: Home / Self Care | Payer: BLUE CROSS/BLUE SHIELD | Source: Ambulatory Visit | Attending: Radiation Oncology | Admitting: Radiation Oncology

## 2014-04-19 ENCOUNTER — Ambulatory Visit: Payer: Medicare Other | Attending: Radiation Oncology

## 2014-04-19 VITALS — BP 108/89 | HR 78 | Resp 16 | Wt 183.3 lb

## 2014-04-19 DIAGNOSIS — C01 Malignant neoplasm of base of tongue: Secondary | ICD-10-CM

## 2014-04-19 DIAGNOSIS — K1233 Oral mucositis (ulcerative) due to radiation: Secondary | ICD-10-CM | POA: Diagnosis not present

## 2014-04-19 DIAGNOSIS — R1310 Dysphagia, unspecified: Secondary | ICD-10-CM

## 2014-04-19 DIAGNOSIS — Z9189 Other specified personal risk factors, not elsewhere classified: Secondary | ICD-10-CM | POA: Diagnosis not present

## 2014-04-19 DIAGNOSIS — R293 Abnormal posture: Secondary | ICD-10-CM | POA: Insufficient documentation

## 2014-04-19 DIAGNOSIS — Z510 Encounter for antineoplastic radiation therapy: Secondary | ICD-10-CM | POA: Diagnosis not present

## 2014-04-19 DIAGNOSIS — L598 Other specified disorders of the skin and subcutaneous tissue related to radiation: Secondary | ICD-10-CM | POA: Diagnosis not present

## 2014-04-19 DIAGNOSIS — F1020 Alcohol dependence, uncomplicated: Secondary | ICD-10-CM | POA: Diagnosis not present

## 2014-04-19 DIAGNOSIS — H918X9 Other specified hearing loss, unspecified ear: Secondary | ICD-10-CM | POA: Insufficient documentation

## 2014-04-19 DIAGNOSIS — F411 Generalized anxiety disorder: Secondary | ICD-10-CM | POA: Diagnosis not present

## 2014-04-19 DIAGNOSIS — B370 Candidal stomatitis: Secondary | ICD-10-CM | POA: Diagnosis not present

## 2014-04-19 DIAGNOSIS — M436 Torticollis: Secondary | ICD-10-CM | POA: Diagnosis not present

## 2014-04-19 MED ORDER — FLUCONAZOLE 100 MG PO TABS: ORAL_TABLET | ORAL | Status: DC

## 2014-04-19 NOTE — Progress Notes (Signed)
7 lb weight loss noted since 04/12/2014. Reports fatigue. Explains he wasn't able to play golf today just rode around in the cart because of fatigue. Reports pain associated with swallowing has begun. Reports using carafate once today for the first time. Denies using lidocaine at all. Thick white coating noted on patient's tongue. No hyperpigmentation or desquamation of anterior neck noted. Reports using Biafine as directed. Report thick rope like saliva. Reports taste changes. Denies cough or SOB.

## 2014-04-19 NOTE — Progress Notes (Signed)
Weekly Management Note:  Outpatient    ICD-9-CM ICD-10-CM   1. Malignant neoplasm of base of tongue 141.0 C01 fluconazole (DIFLUCAN) 100 MG tablet    Current Dose: 20Gy  Projected Dose: 70 Gy   Narrative:  The patient presents for routine under treatment assessment.  CBCT/MVCT images/Port film x-rays were reviewed.  The chart was checked.  7 lb weight loss noted since 04/12/2014. Reports fatigue. Explains he wasn't able to play golf today just rode around in the cart because of fatigue. Reports pain associated with swallowing has begun. Reports using carafate once today for the first time. Denies using lidocaine at all. Reports using Biafine as directed. Report thick rope like saliva. Reports taste changes. Denies cough or SOB.  BP 108/89 mmHg  Pulse 78  Resp 16  Wt 183 lb 4.8 oz (83.144 kg)  SpO2 98%    Physical Findings:  Wt Readings from Last 3 Encounters:  04/05/14 190 lb (86.183 kg)  03/17/14 185 lb (83.915 kg)  08/04/13 186 lb 4 oz (84.482 kg)    weight is 183 lb 4.8 oz (83.144 kg). His blood pressure is 108/89 and his pulse is 78. His respiration is 16 and oxygen saturation is 98%.  +thrush. No hyperpigmentation or desquamation of anterior neck noted.  CBC    Component Value Date/Time   WBC 6.7 04/02/2014 1230   WBC 7.3 03/26/2014 1117   RBC 4.96 04/02/2014 1230   RBC 5.49 03/26/2014 1117   HGB 15.0 04/02/2014 1230   HGB 16.1 03/26/2014 1117   HCT 44.3 04/02/2014 1230   HCT 48.8 03/26/2014 1117   PLT 201 04/02/2014 1230   PLT 220 03/26/2014 1117   MCV 89.3 04/02/2014 1230   MCV 88.9 03/26/2014 1117   MCH 30.2 04/02/2014 1230   MCH 29.3 03/26/2014 1117   MCHC 33.9 04/02/2014 1230   MCHC 33.0 03/26/2014 1117   RDW 13.0 04/02/2014 1230   RDW 12.9 03/26/2014 1117   LYMPHSABS 1.0 03/26/2014 1117   LYMPHSABS 1.3 12/30/2012 0545   MONOABS 0.7 03/26/2014 1117   MONOABS 0.4 12/30/2012 0545   EOSABS 0.1 03/26/2014 1117   EOSABS 0.2 12/30/2012 0545   BASOSABS 0.0  03/26/2014 1117   BASOSABS 0.0 12/30/2012 0545     CMP     Component Value Date/Time   NA 139 04/02/2014 1230   NA 139 03/26/2014 1116   K 4.2 04/02/2014 1230   K 4.6 03/26/2014 1116   CL 105 04/02/2014 1230   CO2 25 04/02/2014 1230   CO2 24 03/26/2014 1116   GLUCOSE 87 04/02/2014 1230   GLUCOSE 103 03/26/2014 1116   BUN 21 04/02/2014 1230   BUN 21.5 03/26/2014 1116   CREATININE 0.85 04/02/2014 1230   CREATININE 1.0 03/26/2014 1116   CALCIUM 9.6 04/02/2014 1230   CALCIUM 9.9 03/26/2014 1116   PROT 7.4 03/26/2014 1116   PROT 7.0 12/30/2012 0545   ALBUMIN 4.1 03/26/2014 1116   ALBUMIN 3.7 12/30/2012 0545   AST 24 03/26/2014 1116   AST 27 12/30/2012 0545   ALT 34 03/26/2014 1116   ALT 31 12/30/2012 0545   ALKPHOS 99 03/26/2014 1116   ALKPHOS 86 12/30/2012 0545   BILITOT 0.83 03/26/2014 1116   BILITOT 0.7 12/30/2012 0545   GFRNONAA 85* 04/02/2014 1230   GFRAA >90 04/02/2014 1230     Impression:  The patient is tolerating radiotherapy.   Plan:  Continue radiotherapy as planned.   lidocaine and sucralfate for future mucositis  Fluconazole for thrush  Baking soda gargle prn  Wt loss - [employer].  -----------------------------------  Eppie Gibson, MD

## 2014-04-19 NOTE — Patient Instructions (Signed)
The exercises are to diminish the possibility of muscle fibrosis (hardening) post radiation tx.  Eat and drink as far into the therapy as possible in order to inhibit swallowing muscle atrophy (shrinking and weakness)

## 2014-04-19 NOTE — Progress Notes (Signed)
To provide support and encouragement, care continuity and to assess for needs, met with patient after his Tomo tmt. 1. He stated he is no longer finds it necessary to take Ativan prior to tmt.   He indicated the motivating factor is that he wants to drive himself to appts as long as he is able. 2. I re-educated the importance of:  Applying Biafine 2-3 times daily but not 4 hrs prior to tmt.  Protecting against sun when outdoors.  Drinking ca. 64 oz fluids daily. 3. He verbalized understanding of guidance provided.  Rick Diehl, RN, BSN, CHPN Head & Neck Oncology Navigator [facility] at [place] [phone]     

## 2014-04-19 NOTE — Telephone Encounter (Signed)
Patient's wife called with question re: flush scheduled for this Wednesday.  I provided clarification.  Gayleen Orem, RN, BSN, [facility] at [place] [phone]

## 2014-04-19 NOTE — Therapy (Signed)
[facility] [address] [place], [place], [address] Phone: [phone]   Fax:  [phone]  Speech Language Pathology Treatment  Patient Details  Name: Dylan Moreno MRN: [id number] Date of Birth: 1940-11-28 Referring Provider:  Seward Carol, MD  Encounter Date: 04/19/2014      End of Session - 04/19/14 1537    Visit Number 2   Number of Visits 3   Date for SLP Re-Evaluation 05/28/14   SLP Start Time 1454   SLP Stop Time  1530   SLP Time Calculation (min) 36 min   Activity Tolerance Patient tolerated treatment well      Past Medical History  Diagnosis Date  . Hyperlipemia   . Arthritis   . ADHD (attention deficit hyperactivity disorder)   . Wears glasses   . Hypercholesterolemia   . Peyronie's disease   . Kidney stones   . TIA (transient ischemic attack)   . Hypertension   . Anxiety   . GERD (gastroesophageal reflux disease)   . 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 TIA (transient ischemic attack) 12/29/12    left facial droop    Past Surgical History  Procedure Laterality Date  . Dupuytren contracture release  2012    left hand  . Tonsillectomy    . Shoulder arthroscopy w/ rotator cuff repair      left  . Colonoscopy    . Cystoscopy w/ ureteroscopy w/ lithotripsy  2011  . Hand surgery      right  . Dupuytren contracture release Right 10/30/2012    Procedure: DUPUYTREN CONTRACTURE RELEASE RIGHT PALM,RING AND SMALL FINGER;  Surgeon: Cammie Sickle., MD;  Location: [facility];  Service: Orthopedics;  Laterality: Right;    There were no vitals filed for this visit.  Visit Diagnosis: Dysphagia      Subjective Assessment - 04/19/14 1537    Patient is accompained by: Jackelyn Poling               ADULT SLP TREATMENT - 04/19/14 1503    General Information   Behavior/Cognition Alert;Cooperative;Pleasant mood   Treatment Provided   Treatment  provided Dysphagia   Dysphagia Treatment   Temperature Spikes Noted Yes   Treatment Methods Skilled observation;Therapeutic exercise   Patient observed directly with PO's Yes   Type of PO's observed Dysphagia 3 (soft)   Liquids provided via Cup   Oral Phase Signs & Symptoms --  none   Pharyngeal Phase Signs & Symptoms --  none   Type of cueing Verbal   Amount of cueing Minimal   Other treatment/comments Pt told SLP why he was completing HEP with mod A. Pt without s/s aspiration with POs today. SLP req'd to provide min A occasionally for HEP - most difficulty with [place].   Pain Assessment   Pain Assessment 0-10   Pain Score 6    Pain Location throat   Pain Descriptors / Indicators Tender   Pain Intervention(s) Monitored during session;Premedicated before session   Assessment / Recommendations / Plan   Plan Continue with current plan of care   Dysphagia Recommendations   Diet recommendations Dysphagia 3 (mechanical soft);Dysphagia 2 (fine chop);Thin liquid   Progression Toward Goals   Progression toward goals Progressing toward goals          SLP Education - 04/19/14 1536    Education provided Yes   Education Details HEP, muscle fibrosis and atrophy  Person(s) Educated Patient;Spouse   Methods Explanation;Demonstration;Verbal cues   Comprehension Verbalized understanding;Returned demonstration;Verbal cues required;Need further instruction          SLP Short Term Goals - 04/19/14 1540    SLP [place] #1   Title pt will complete HEP with occasional min A   Time 1   Period --  visit   Status Partially Met   SLP SHORT TERM GOAL #2   Title pt will tell SLP why he is completing HEP with rare min A   Time 1   Period --  visit   Status Partially Met          SLP Long Term Goals - 04/19/14 1541    SLP LONG TERM GOAL #1   Title pt will complete HEP with rare min A   Time 2   Period --  visits   Status On-going   SLP LONG TERM GOAL #2   Title pt will  tell SLP 3 signs/symptoms aspiration PNA   Time 2   Period --  visits   Status On-going   SLP LONG TERM GOAL #3   Title pt will tell SLP how a food journal can be helpful in return to full PO diet   Time 2   Period --  visits   Status On-going          Plan - 04/19/14 1538    Clinical Impression Statement Pt with WNL swalowing skill currently, given soft diet and thin liquds. Pt's odynophagia increasing. Skilled ST remains necessary to assess safety with POs through rad tx, and to assess success with swalowing HEP.   Speech Therapy Frequency --  approx every four weeks   Duration --  until 05-28-14   Treatment/Interventions Pharyngeal strengthening exercises;Oral motor exercises;Compensatory strategies;Patient/family education;SLP instruction and feedback   Potential to Achieve Goals Good   Potential Considerations Ability to learn/carryover information        Problem List Patient Active Problem List   Diagnosis Date Noted  . Weight loss 04/14/2014  . Nausea without vomiting 04/14/2014  . Chronic alcoholism 03/18/2014  . 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  . TIA (transient ischemic attack) 12/29/2012  . Hyperlipemia   . ADHD (attention deficit hyperactivity disorder)     Bridgett Larsson 04/19/2014, 3:42 PM  [facility] [address] [place], [place], [address] Phone: [phone]   Fax:  [phone]

## 2014-04-20 ENCOUNTER — Ambulatory Visit: Payer: Medicare Other | Admitting: Nutrition

## 2014-04-20 ENCOUNTER — Ambulatory Visit
Admission: RE | Admit: 2014-04-20 | Discharge: 2014-04-20 | Disposition: A | Discharge status: Home / Self Care | Payer: Medicare Other | Source: Ambulatory Visit | Attending: Radiation Oncology | Admitting: Radiation Oncology

## 2014-04-20 ENCOUNTER — Telehealth: Payer: Self-pay | Admitting: *Deleted

## 2014-04-20 DIAGNOSIS — B370 Candidal stomatitis: Secondary | ICD-10-CM | POA: Diagnosis not present

## 2014-04-20 DIAGNOSIS — K1233 Oral mucositis (ulcerative) due to radiation: Secondary | ICD-10-CM | POA: Diagnosis not present

## 2014-04-20 DIAGNOSIS — Z510 Encounter for antineoplastic radiation therapy: Secondary | ICD-10-CM | POA: Diagnosis not present

## 2014-04-20 DIAGNOSIS — L598 Other specified disorders of the skin and subcutaneous tissue related to radiation: Secondary | ICD-10-CM | POA: Diagnosis not present

## 2014-04-20 DIAGNOSIS — F411 Generalized anxiety disorder: Secondary | ICD-10-CM | POA: Diagnosis not present

## 2014-04-20 DIAGNOSIS — C01 Malignant neoplasm of base of tongue: Secondary | ICD-10-CM | POA: Diagnosis not present

## 2014-04-20 NOTE — Progress Notes (Signed)
Nutrition follow-up completed with patient and wife.  Patient being treated for tongue cancer. Reports decreased oral intake but is tolerating cream of wheat, cheese, eggs and soft foods like potatoes and pinto beans.   He complains of constipation but states he does not take MiraLAX regularly. Weight decreased and documented as 183.3 pounds April 18, down from 190.2 pounds April 11.  Nutrition diagnosis: Predicted suboptimal energy intake has evolved into inadequate oral intake related to tongue cancer as evidenced by 7 pound weight loss.  Estimated nutrition needs: 2300-2500 calories, 115-125 grams protein, 2.5 L fluid.  Intervention:  Patient educated to increase soft moist foods throughout the day. Recommended patient drink Ensure Plus as tolerated. Patient will begin using Osmolite 1.5 one can 4 times a day with 60 cc free water before and after each feeding via PEG. If tolerated, patient will increase Osmolite 1.5-1-1/2 cans 4 times a day with 60 ccfree water before and after each feeding.   In addition patient will consume or flush tube with 720 cc free water. Education provided along with written fact sheets.  Questions were answered.  Teach back method used. Patient was provided with samples of tube feeding.  Monitoring, evaluation, goals: Patient will tolerate oral intake along with tube feedings to minimize further weight loss.  Next visit: Thursday, May 5.  **Disclaimer: This note was dictated with voice recognition software. Similar sounding words can inadvertently be transcribed and this note may contain transcription errors which may not have been corrected upon publication of note.**

## 2014-04-20 NOTE — Telephone Encounter (Signed)
Patient wife called to confirm the correct application of lidocaine. 1. She repeated directions on bottle, explained how she is dispensing. 2. I confirmed that she was using correctly.  Gayleen Orem, RN, BSN, [facility] at [place] [phone]

## 2014-04-21 ENCOUNTER — Ambulatory Visit (HOSPITAL_BASED_OUTPATIENT_CLINIC_OR_DEPARTMENT_OTHER): Payer: Medicare Other

## 2014-04-21 ENCOUNTER — Other Ambulatory Visit (HOSPITAL_BASED_OUTPATIENT_CLINIC_OR_DEPARTMENT_OTHER): Payer: Medicare Other

## 2014-04-21 ENCOUNTER — Ambulatory Visit (HOSPITAL_BASED_OUTPATIENT_CLINIC_OR_DEPARTMENT_OTHER): Payer: Medicare Other | Admitting: Hematology and Oncology

## 2014-04-21 ENCOUNTER — Encounter (HOSPITAL_COMMUNITY): Payer: Self-pay | Admitting: Dentistry

## 2014-04-21 ENCOUNTER — Ambulatory Visit
Admission: RE | Admit: 2014-04-21 | Discharge: 2014-04-21 | Disposition: A | Discharge status: Home / Self Care | Payer: Medicare Other | Source: Ambulatory Visit | Attending: Radiation Oncology | Admitting: Radiation Oncology

## 2014-04-21 ENCOUNTER — Encounter: Payer: Self-pay | Admitting: *Deleted

## 2014-04-21 ENCOUNTER — Telehealth: Payer: Self-pay | Admitting: Hematology and Oncology

## 2014-04-21 ENCOUNTER — Ambulatory Visit (HOSPITAL_COMMUNITY): Payer: Self-pay | Admitting: Dentistry

## 2014-04-21 ENCOUNTER — Encounter: Payer: Self-pay | Admitting: Hematology and Oncology

## 2014-04-21 VITALS — BP 126/69 | HR 72 | Temp 98.0°F | Wt 186.0 lb

## 2014-04-21 VITALS — BP 132/72 | HR 81 | Temp 98.6°F | Resp 18 | Ht 73.0 in | Wt 185.1 lb

## 2014-04-21 DIAGNOSIS — K1231 Oral mucositis (ulcerative) due to antineoplastic therapy: Secondary | ICD-10-CM | POA: Diagnosis not present

## 2014-04-21 DIAGNOSIS — R634 Abnormal weight loss: Secondary | ICD-10-CM

## 2014-04-21 DIAGNOSIS — K1230 Oral mucositis (ulcerative), unspecified: Secondary | ICD-10-CM

## 2014-04-21 DIAGNOSIS — Z01818 Encounter for other preprocedural examination: Secondary | ICD-10-CM

## 2014-04-21 DIAGNOSIS — K1233 Oral mucositis (ulcerative) due to radiation: Secondary | ICD-10-CM | POA: Diagnosis not present

## 2014-04-21 DIAGNOSIS — F411 Generalized anxiety disorder: Secondary | ICD-10-CM | POA: Diagnosis not present

## 2014-04-21 DIAGNOSIS — C01 Malignant neoplasm of base of tongue: Secondary | ICD-10-CM

## 2014-04-21 DIAGNOSIS — R682 Dry mouth, unspecified: Secondary | ICD-10-CM

## 2014-04-21 DIAGNOSIS — Z510 Encounter for antineoplastic radiation therapy: Secondary | ICD-10-CM | POA: Diagnosis not present

## 2014-04-21 DIAGNOSIS — E860 Dehydration: Secondary | ICD-10-CM | POA: Diagnosis not present

## 2014-04-21 DIAGNOSIS — Z95828 Presence of other vascular implants and grafts: Secondary | ICD-10-CM

## 2014-04-21 DIAGNOSIS — L598 Other specified disorders of the skin and subcutaneous tissue related to radiation: Secondary | ICD-10-CM | POA: Diagnosis not present

## 2014-04-21 DIAGNOSIS — B370 Candidal stomatitis: Secondary | ICD-10-CM | POA: Diagnosis not present

## 2014-04-21 DIAGNOSIS — K117 Disturbances of salivary secretion: Secondary | ICD-10-CM

## 2014-04-21 DIAGNOSIS — R1310 Dysphagia, unspecified: Secondary | ICD-10-CM

## 2014-04-21 DIAGNOSIS — R432 Parageusia: Secondary | ICD-10-CM

## 2014-04-21 LAB — CBC WITH DIFFERENTIAL/PLATELET
BASO%: 0.2 % (ref 0.0–2.0)
BASOS ABS: 0 10*3/uL (ref 0.0–0.1)
EOS ABS: 0 10*3/uL (ref 0.0–0.5)
EOS%: 0.3 % (ref 0.0–7.0)
HCT: 39.6 % (ref 38.4–49.9)
HGB: 13.2 g/dL (ref 13.0–17.1)
LYMPH%: 4.9 % — AB (ref 14.0–49.0)
MCH: 29.4 pg (ref 27.2–33.4)
MCHC: 33.2 g/dL (ref 32.0–36.0)
MCV: 88.4 fL (ref 79.3–98.0)
MONO#: 0.3 10*3/uL (ref 0.1–0.9)
MONO%: 5.8 % (ref 0.0–14.0)
NEUT%: 88.8 % — ABNORMAL HIGH (ref 39.0–75.0)
NEUTROS ABS: 4.8 10*3/uL (ref 1.5–6.5)
PLATELETS: 179 10*3/uL (ref 140–400)
RBC: 4.47 10*6/uL (ref 4.20–5.82)
RDW: 12.5 % (ref 11.0–14.6)
WBC: 5.4 10*3/uL (ref 4.0–10.3)
lymph#: 0.3 10*3/uL — ABNORMAL LOW (ref 0.9–3.3)

## 2014-04-21 LAB — COMPREHENSIVE METABOLIC PANEL (CC13)
ALK PHOS: 100 U/L (ref 40–150)
ALT: 21 U/L (ref 0–55)
AST: 19 U/L (ref 5–34)
Albumin: 3.5 g/dL (ref 3.5–5.0)
Anion Gap: 13 mEq/L — ABNORMAL HIGH (ref 3–11)
BUN: 25.5 mg/dL (ref 7.0–26.0)
CO2: 23 mEq/L (ref 22–29)
Calcium: 9.4 mg/dL (ref 8.4–10.4)
Chloride: 102 mEq/L (ref 98–109)
Creatinine: 1 mg/dL (ref 0.7–1.3)
EGFR: 76 mL/min/{1.73_m2} — ABNORMAL LOW (ref >90)
Glucose: 140 mg/dl (ref 70–140)
Potassium: 4.3 mEq/L (ref 3.5–5.1)
Sodium: 139 mEq/L (ref 136–145)
TOTAL PROTEIN: 6.9 g/dL (ref 6.4–8.3)
Total Bilirubin: 0.48 mg/dL (ref 0.20–1.20)

## 2014-04-21 MED ORDER — MORPHINE SULFATE (CONCENTRATE) 10 MG/0.5ML PO SOLN: 10.0000 mg | ORAL | Status: DC | PRN

## 2014-04-21 MED ORDER — FENTANYL 25 MCG/HR TD PT72: 25.0000 ug | MEDICATED_PATCH | TRANSDERMAL | Status: DC

## 2014-04-21 MED ORDER — HEPARIN SOD (PORK) LOCK FLUSH 100 UNIT/ML IV SOLN
500.0000 [IU] | Freq: Once | INTRAVENOUS | Status: AC
  Administered 2014-04-21: 500 [IU] via INTRAVENOUS
  Filled 2014-04-21: qty 5

## 2014-04-21 MED ORDER — SODIUM CHLORIDE 0.9 % IJ SOLN
10.0000 mL | INTRAMUSCULAR | Status: DC | PRN
  Administered 2014-04-21: 10 mL via INTRAVENOUS
  Filled 2014-04-21: qty 10

## 2014-04-21 NOTE — Progress Notes (Signed)
Patient wife brought ADs yesterday for patient and herself.  I delivered copies to [facility] for scanning, will return originals today during patient appt with Dr. Alvy Bimler.  Gayleen Orem, RN, BSN, [facility] at [place] [phone]

## 2014-04-21 NOTE — Progress Notes (Signed)
04/21/2014  Patient Name:   Dylan Moreno Date of Birth:   November 20, 1940 Medical Record Number: [id number]  BP 126/69 mmHg  Pulse 72  Temp(Src) 98 F (36.7 C) (Oral)  Wt 186 lb (84.369 kg)  Florene Route Helin presents for oral examination during chemoradiation therapy. Patient has completed 13/35 radiation treatments. Patient has had 1 cycle of chemotherapy.  REVIEW OF CHIEF COMPLAINTS:  DRY MOUTH: Yes HARD TO SWALLOW: Yes  HURT TO SWALLOW: Yes TASTE CHANGES: Patient has minimal taste remaining. SORES IN MOUTH: Yes TRISMUS: Problems with trismus. WEIGHT: 186 pounds  HOME OH REGIMEN:  BRUSHING: Twice a day FLOSSING: Will start flossing at bedtime if able to. Cautioned about bleeding gums. INSING: Rinsing with salt water and baking soda and Biotene rinses. FLUORIDE: Using fluoride at bedtime. TRISMUS EXERCISES:  Maximum interincisal opening: 50 mm  DENTAL EXAM:  Oral Hygiene:(PLAQUE): Good oral hygiene LOCATION OF MUCOSITIS: Back of throat, buccal mucosa, and Lateral tongue. Erythema and oral ulcerations. DESCRIPTION OF SALIVA: Decreased saliva. Foamy saliva. ANY EXPOSED BONE: None noted OTHER WATCHED AREAS: Mandibular teeth that are close to the primary field radiation therapy greater than 5000 cGy. DX: Xerostomia, Dysgeusia, Dysphagia, Odynophagia, Weight Loss and Mucositis  RECOMMENDATIONS: 1. Brush after meals and at bedtime.  Use fluoride at bedtime. 2. Use trismus exercises as directed. 3. Use Biotene Rinse or salt water/baking soda rinses. 4. Multiple sips of water as needed. 5. Return to clinic in two months for oral exam after radiation therapy. Call if problems before then.  Lenn Cal, DDS

## 2014-04-21 NOTE — Patient Instructions (Signed)

## 2014-04-21 NOTE — Patient Instructions (Signed)
RECOMMENDATIONS: 1. Brush after meals and at bedtime.  Use fluoride at bedtime. 2. Use trismus exercises as directed. 3. Use Biotene Rinse or salt water/baking soda rinses. 4. Multiple sips of water as needed. 5. Return to clinic in two months for oral exam after radiation therapy. Call if problems before then.  Sopheap Boehle F. Carry Ortez, DDS   

## 2014-04-21 NOTE — Telephone Encounter (Signed)
Pt confirmed labs/ov per 04/20 POF, gave pt AVS and Calendar.... KJ  °

## 2014-04-22 ENCOUNTER — Ambulatory Visit
Admission: RE | Admit: 2014-04-22 | Discharge: 2014-04-22 | Disposition: A | Discharge status: Home / Self Care | Payer: Medicare Other | Source: Ambulatory Visit | Attending: Radiation Oncology | Admitting: Radiation Oncology

## 2014-04-22 ENCOUNTER — Encounter: Payer: Self-pay | Admitting: Hematology and Oncology

## 2014-04-22 DIAGNOSIS — B370 Candidal stomatitis: Secondary | ICD-10-CM | POA: Diagnosis not present

## 2014-04-22 DIAGNOSIS — L598 Other specified disorders of the skin and subcutaneous tissue related to radiation: Secondary | ICD-10-CM | POA: Diagnosis not present

## 2014-04-22 DIAGNOSIS — K1231 Oral mucositis (ulcerative) due to antineoplastic therapy: Secondary | ICD-10-CM | POA: Insufficient documentation

## 2014-04-22 DIAGNOSIS — C01 Malignant neoplasm of base of tongue: Secondary | ICD-10-CM | POA: Diagnosis not present

## 2014-04-22 DIAGNOSIS — K1233 Oral mucositis (ulcerative) due to radiation: Secondary | ICD-10-CM | POA: Diagnosis not present

## 2014-04-22 DIAGNOSIS — Z510 Encounter for antineoplastic radiation therapy: Secondary | ICD-10-CM | POA: Diagnosis not present

## 2014-04-22 DIAGNOSIS — E860 Dehydration: Secondary | ICD-10-CM | POA: Insufficient documentation

## 2014-04-22 DIAGNOSIS — F411 Generalized anxiety disorder: Secondary | ICD-10-CM | POA: Diagnosis not present

## 2014-04-22 NOTE — Progress Notes (Signed)
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 He is seen today as part of his weekly supportive care visit. He is starting to experience more side effects with treatment with increasing pain, mouth sores and difficulties eating. He felt dehydrated and mildly weak because of this. REVIEW OF SYSTEMS:   Constitutional: Denies fevers, chills or abnormal weight loss Eyes: Denies blurriness of vision Ears, nose, mouth, throat, and face: Denies mucositis or sore throat Respiratory: Denies cough, dyspnea or wheezes Cardiovascular: Denies palpitation, chest discomfort or lower extremity swelling Skin: Denies abnormal skin rashes Lymphatics: Denies new lymphadenopathy or easy bruising Neurological:Denies numbness, tingling or new weaknesses Behavioral/Psych: Mood is stable, no new changes  All other systems were reviewed with the patient and are negative.  I have reviewed the past medical history, past surgical history, social history and family history with the  patient and they are unchanged from previous note.  ALLERGIES:  has No Known Allergies.  MEDICATIONS:  Current Outpatient Prescriptions  Medication Sig Dispense Refill  . amphetamine-dextroamphetamine (ADDERALL) 20 MG tablet Take 20 mg by mouth 2 (two) times daily.    Marland Kitchen aspirin 81 MG tablet Take 81 mg by mouth daily.    Marland Kitchen atorvastatin (LIPITOR) 10 MG tablet Take 10 mg by mouth daily.    . fluconazole (DIFLUCAN) 100 MG tablet Take 2 tablets today, then 1 tablet daily x 20 more days for yeast in mouth. 22 tablet 0  . levothyroxine (SYNTHROID, LEVOTHROID) 75 MCG tablet Take 75 mcg by mouth daily before breakfast.     . lidocaine (XYLOCAINE) 2 % solution Patient: Mix 1part 2% viscous lidocaine, 1part H20. Swish and/or swallow 39mL of this mixture, 35min before meals and at bedtime, up to QID 100 mL 5  . lidocaine-prilocaine (EMLA) cream Apply to affected area once 30 g 3  . LORazepam (ATIVAN) 0.5 MG tablet Take 1 tablet approximately 30 min prior to radiotherapy for anxiety/claustrophobia 40 tablet 0  . Multiple Vitamin (MULTIVITAMIN WITH MINERALS) TABS tablet Take 1 tablet by mouth daily.    . ondansetron (ZOFRAN) 8 MG tablet Take 1 tablet (8 mg total) by mouth every 8 (eight) hours as needed. 30 tablet 1  . prochlorperazine (COMPAZINE) 10 MG tablet Take 1 tablet (10 mg total) by mouth every 6 (six) hours as needed (Nausea or vomiting). 30 tablet 1  . ranitidine (ZANTAC) 150 MG/10ML syrup Take 10 mLs (150 mg total) by mouth 2 (two) times daily. May flush into PEG tube. To prevent indigestion. 300 mL 3  . sodium fluoride (FLUORISHIELD) 1.1 % GEL dental gel Instill  one drop of gel per tooth space of fluoride tray. Place over teeth for 5 minutes. Remove. Spit out excess. Repeat nightly. 120 mL prn  . sucralfate (CARAFATE) 1 G tablet Dissolve 1 tablet in 10 mL H20 and swallow QID PRN sore throat. 40 tablet 5  . Wound Dressings (RADIAGEL) GEL Apply topically 2 (two) times daily.    . fentaNYL  (DURAGESIC - DOSED MCG/HR) 25 MCG/HR patch Place 1 patch (25 mcg total) onto the skin every 3 (three) days. 5 patch 0  . Morphine Sulfate (MORPHINE CONCENTRATE) 10 MG/0.5ML SOLN concentrated solution Take 0.5 mLs (10 mg total) by mouth every 2 (two) hours as needed. 240 mL 0   No current facility-administered medications for this visit.    PHYSICAL EXAMINATION: ECOG PERFORMANCE STATUS: 1 - Symptomatic but completely ambulatory  Filed Vitals:   04/21/14 1144  BP: 132/72  Pulse: 81  Temp: 98.6 F (37 C)  Resp: 18   Filed Weights   04/21/14 1144  Weight: 185 lb 1.6 oz (83.961 kg)    GENERAL:alert, no distress and comfortable SKIN: skin color, texture, turgor are normal, no rashes or significant lesions EYES: normal, Conjunctiva are pink and non-injected, sclera clear OROPHARYNX: He has signs of mucositis without thrush. He has significant dry mucous membrane NECK: supple, thyroid normal size, non-tender, without nodularity LYMPH:  Previously palpable lymphadenopathy has regressed in size. LUNGS: clear to auscultation and percussion with normal breathing effort HEART: regular rate & rhythm and no murmurs and no lower extremity edema ABDOMEN:abdomen soft, non-tender and normal bowel sounds Musculoskeletal:no cyanosis of digits and no clubbing  NEURO: alert & oriented x 3 with fluent speech, no focal motor/sensory deficits  LABORATORY DATA:  I have reviewed the data as listed    Component Value Date/Time   NA 139 04/21/2014 1119   NA 139 04/02/2014 1230   K 4.3 04/21/2014 1119   K 4.2 04/02/2014 1230   CL 105 04/02/2014 1230   CO2 23 04/21/2014 1119   CO2 25 04/02/2014 1230   GLUCOSE 140 04/21/2014 1119   GLUCOSE 87 04/02/2014 1230   BUN 25.5 04/21/2014 1119   BUN 21 04/02/2014 1230   CREATININE 1.0 04/21/2014 1119   CREATININE 0.85 04/02/2014 1230   CALCIUM 9.4 04/21/2014 1119   CALCIUM 9.6 04/02/2014 1230   PROT 6.9 04/21/2014 1119   PROT 7.0 12/30/2012 0545    ALBUMIN 3.5 04/21/2014 1119   ALBUMIN 3.7 12/30/2012 0545   AST 19 04/21/2014 1119   AST 27 12/30/2012 0545   ALT 21 04/21/2014 1119   ALT 31 12/30/2012 0545   ALKPHOS 100 04/21/2014 1119   ALKPHOS 86 12/30/2012 0545   BILITOT 0.48 04/21/2014 1119   BILITOT 0.7 12/30/2012 0545   GFRNONAA 85* 04/02/2014 1230   GFRAA >90 04/02/2014 1230    No results found for: SPEP, UPEP  Lab Results  Component Value Date   WBC 5.4 04/21/2014   NEUTROABS 4.8 04/21/2014   HGB 13.2 04/21/2014   HCT 39.6 04/21/2014   MCV 88.4 04/21/2014   PLT 179 04/21/2014      Chemistry      Component Value Date/Time   NA 139 04/21/2014 1119   NA 139 04/02/2014 1230   K 4.3 04/21/2014 1119   K 4.2 04/02/2014 1230   CL 105 04/02/2014 1230   CO2 23 04/21/2014 1119   CO2 25 04/02/2014 1230   BUN 25.5 04/21/2014 1119   BUN 21 04/02/2014 1230   CREATININE 1.0 04/21/2014  1119   CREATININE 0.85 04/02/2014 1230      Component Value Date/Time   CALCIUM 9.4 04/21/2014 1119   CALCIUM 9.6 04/02/2014 1230   ALKPHOS 100 04/21/2014 1119   ALKPHOS 86 12/30/2012 0545   AST 19 04/21/2014 1119   AST 27 12/30/2012 0545   ALT 21 04/21/2014 1119   ALT 31 12/30/2012 0545   BILITOT 0.48 04/21/2014 1119   BILITOT 0.7 12/30/2012 0545      ASSESSMENT & PLAN:  Malignant neoplasm of base of tongue He has started to experience more side effects. Continue aggressive supportive care. I will start him on  medications for pain management.   Weight loss I reinforced the importance of using his feeding tube for nutritional needs. I will start him on daily IV fluids next week.   Mucositis due to chemotherapy I will start him on prescription fentanyl patch and liquid morphine. I discussed about risk associated with pain medicine such as nausea, constipation and sedation. We discussed about narcotic refill policy.   Dehydration He appears clinically dehydrated. I will start him on IV fluids daily this week and next  week.    Orders Placed This Encounter  Procedures  . Magnesium    Standing Status: Standing     Number of Occurrences: 9     Standing Expiration Date: 04/21/2015   All questions were answered. The patient knows to call the clinic with any problems, questions or concerns. No barriers to learning was detected. I spent 25 minutes counseling the patient face to face. The total time spent in the appointment was 30 minutes and more than 50% was on counseling and review of test results     [facility], Melody Cirrincione, MD 04/22/2014 9:18 AM

## 2014-04-22 NOTE — Assessment & Plan Note (Signed)
He appears clinically dehydrated. I will start him on IV fluids daily this week and next week.

## 2014-04-22 NOTE — Assessment & Plan Note (Signed)
I will start him on prescription fentanyl patch and liquid morphine. I discussed about risk associated with pain medicine such as nausea, constipation and sedation. We discussed about narcotic refill policy.

## 2014-04-22 NOTE — Assessment & Plan Note (Signed)
He has started to experience more side effects. Continue aggressive supportive care. I will start him on  medications for pain management.

## 2014-04-22 NOTE — Assessment & Plan Note (Signed)
I reinforced the importance of using his feeding tube for nutritional needs. I will start him on daily IV fluids next week.

## 2014-04-23 ENCOUNTER — Ambulatory Visit
Admission: RE | Admit: 2014-04-23 | Discharge: 2014-04-23 | Disposition: A | Discharge status: Home / Self Care | Payer: Medicare Other | Source: Ambulatory Visit | Attending: Radiation Oncology | Admitting: Radiation Oncology

## 2014-04-23 DIAGNOSIS — C01 Malignant neoplasm of base of tongue: Secondary | ICD-10-CM | POA: Diagnosis not present

## 2014-04-23 DIAGNOSIS — K1233 Oral mucositis (ulcerative) due to radiation: Secondary | ICD-10-CM | POA: Diagnosis not present

## 2014-04-23 DIAGNOSIS — L598 Other specified disorders of the skin and subcutaneous tissue related to radiation: Secondary | ICD-10-CM | POA: Diagnosis not present

## 2014-04-23 DIAGNOSIS — F411 Generalized anxiety disorder: Secondary | ICD-10-CM | POA: Diagnosis not present

## 2014-04-23 DIAGNOSIS — Z510 Encounter for antineoplastic radiation therapy: Secondary | ICD-10-CM | POA: Diagnosis not present

## 2014-04-23 DIAGNOSIS — B370 Candidal stomatitis: Secondary | ICD-10-CM | POA: Diagnosis not present

## 2014-04-23 NOTE — Progress Notes (Signed)
To provide support and encouragement, care continuity and to assess for needs, met with patient during est pt appt with Dr. Alvy Bimler.  His wife accompanied him. 1. I provided additional education/re-education:  Re: newly prescribed Fentanyl patch in conjunction with liquid morphine for breakthrough pain.  Twice daily application of Biafine.  He understands not to apply 4 hrs prior to RT.  We discussed that he should not drive while taking narcotics.  His wife reinforced/supported this guidance. 2. He verbalized understanding of next Wed's chemo, scheduling of IVF next Monday. 3. Patient and wife understand they can contact me with questions/concerns.  Gayleen Orem, RN, BSN, [place] at [place] [phone]

## 2014-04-26 ENCOUNTER — Ambulatory Visit
Admission: RE | Admit: 2014-04-26 | Discharge: 2014-04-26 | Disposition: A | Discharge status: Home / Self Care | Payer: Medicare Other | Source: Ambulatory Visit | Attending: Radiation Oncology | Admitting: Radiation Oncology

## 2014-04-26 ENCOUNTER — Ambulatory Visit (HOSPITAL_BASED_OUTPATIENT_CLINIC_OR_DEPARTMENT_OTHER): Payer: Medicare Other

## 2014-04-26 ENCOUNTER — Encounter: Payer: Self-pay | Admitting: Radiation Oncology

## 2014-04-26 VITALS — BP 113/61 | HR 81

## 2014-04-26 VITALS — BP 109/77 | HR 78 | Temp 98.6°F | Resp 20 | Wt 184.0 lb

## 2014-04-26 DIAGNOSIS — K1233 Oral mucositis (ulcerative) due to radiation: Secondary | ICD-10-CM | POA: Diagnosis not present

## 2014-04-26 DIAGNOSIS — R634 Abnormal weight loss: Secondary | ICD-10-CM

## 2014-04-26 DIAGNOSIS — B370 Candidal stomatitis: Secondary | ICD-10-CM | POA: Diagnosis not present

## 2014-04-26 DIAGNOSIS — Z510 Encounter for antineoplastic radiation therapy: Secondary | ICD-10-CM | POA: Diagnosis not present

## 2014-04-26 DIAGNOSIS — E860 Dehydration: Secondary | ICD-10-CM | POA: Diagnosis not present

## 2014-04-26 DIAGNOSIS — L598 Other specified disorders of the skin and subcutaneous tissue related to radiation: Secondary | ICD-10-CM | POA: Diagnosis not present

## 2014-04-26 DIAGNOSIS — F411 Generalized anxiety disorder: Secondary | ICD-10-CM | POA: Diagnosis not present

## 2014-04-26 DIAGNOSIS — C01 Malignant neoplasm of base of tongue: Secondary | ICD-10-CM

## 2014-04-26 MED ORDER — SODIUM CHLORIDE 0.9 % IV SOLN
Freq: Once | INTRAVENOUS | Status: AC
  Administered 2014-04-26: 15:00:00 via INTRAVENOUS

## 2014-04-26 MED ORDER — HEPARIN SOD (PORK) LOCK FLUSH 100 UNIT/ML IV SOLN
500.0000 [IU] | Freq: Once | INTRAVENOUS | Status: AC | PRN
  Administered 2014-04-26: 500 [IU]
  Filled 2014-04-26: qty 5

## 2014-04-26 MED ORDER — SODIUM CHLORIDE 0.9 % IJ SOLN
10.0000 mL | INTRAMUSCULAR | Status: DC | PRN
  Administered 2014-04-26: 10 mL
  Filled 2014-04-26: qty 10

## 2014-04-26 NOTE — Patient Instructions (Signed)
Dehydration, Adult Dehydration is when you lose more fluids from the body than you take in. Vital organs like the kidneys, brain, and heart cannot function without a proper amount of fluids and salt. Any loss of fluids from the body can cause dehydration.  CAUSES   Vomiting.  Diarrhea.  Excessive sweating.  Excessive urine output.  Fever. SYMPTOMS  Mild dehydration  Thirst.  Dry lips.  Slightly dry mouth. Moderate dehydration  Very dry mouth.  Sunken eyes.  Skin does not bounce back quickly when lightly pinched and released.  Dark urine and decreased urine production.  Decreased tear production.  Headache. Severe dehydration  Very dry mouth.  Extreme thirst.  Rapid, weak pulse (more than 100 beats per minute at rest).  Cold hands and feet.  Not able to sweat in spite of heat and temperature.  Rapid breathing.  Blue lips.  Confusion and lethargy.  Difficulty being awakened.  Minimal urine production.  No tears. DIAGNOSIS  Your caregiver will diagnose dehydration based on your symptoms and your exam. Blood and urine tests will help confirm the diagnosis. The diagnostic evaluation should also identify the cause of dehydration. TREATMENT  Treatment of mild or moderate dehydration can often be done at home by increasing the amount of fluids that you drink. It is best to drink small amounts of fluid more often. Drinking too much at one time can make vomiting worse. Refer to the home care instructions below. Severe dehydration needs to be treated at the hospital where you will probably be given intravenous (IV) fluids that contain water and electrolytes. HOME CARE INSTRUCTIONS   Ask your caregiver about specific rehydration instructions.  Drink enough fluids to keep your urine clear or pale yellow.  Drink small amounts frequently if you have nausea and vomiting.  Eat as you normally do.  Avoid:  Foods or drinks high in sugar.  Carbonated  drinks.  Juice.  Extremely hot or cold fluids.  Drinks with caffeine.  Fatty, greasy foods.  Alcohol.  Tobacco.  Overeating.  Gelatin desserts.  Wash your hands well to avoid spreading bacteria and viruses.  Only take over-the-counter or prescription medicines for pain, discomfort, or fever as directed by your caregiver.  Ask your caregiver if you should continue all prescribed and over-the-counter medicines.  Keep all follow-up appointments with your caregiver. SEEK MEDICAL CARE IF:  You have abdominal pain and it increases or stays in one area (localizes).  You have a rash, stiff neck, or severe headache.  You are irritable, sleepy, or difficult to awaken.  You are weak, dizzy, or extremely thirsty. SEEK IMMEDIATE MEDICAL CARE IF:   You are unable to keep fluids down or you get worse despite treatment.  You have frequent episodes of vomiting or diarrhea.  You have blood or green matter (bile) in your vomit.  You have blood in your stool or your stool looks black and tarry.  You have not urinated in 6 to 8 hours, or you have only urinated a small amount of very dark urine.  You have a fever.  You faint. MAKE SURE YOU:   Understand these instructions.  Will watch your condition.  Will get help right away if you are not doing well or get worse. Document Released: 12/18/2004 Document Revised: 03/12/2011 Document Reviewed: 08/07/2010 ExitCare Patient Information 2015 [employer]. This information is not intended to replace advice given to you by your health care provider. Make sure you discuss any questions you have with your health care   provider.  

## 2014-04-26 NOTE — Progress Notes (Signed)
   Weekly Management Note:  Outpatient    ICD-9-CM ICD-10-CM   1. Malignant neoplasm of base of tongue 141.0 C01     Current Dose:  30 Gy  Projected Dose: 70 Gy   Narrative:  The patient presents for routine under treatment assessment.  CBCT/MVCT images/Port film x-rays were reviewed.  The chart was checked. Pt notes not drinking enough water. Pt notes intermittent/rare nausea when eating. Pt notices increase of a right neck lump and shrinkage of another lump on left neck - both part of RT area. Reviewed anti-nausea medications with patient.   Physical Findings:  weight is 184 lb (83.462 kg). His oral temperature is 98.6 F (37 C). His blood pressure is 109/77 and his pulse is 78. His respiration is 20.   Wt Readings from Last 3 Encounters:  04/21/14 186 lb (84.369 kg)  04/21/14 185 lb 1.6 oz (83.961 kg)  04/05/14 190 lb (86.183 kg)   Bilateral neck swelling, left more than right, skin intact, thick sputum in mouth with no thrush.   Impression:  The patient is tolerating radiotherapy. Swelling in neck most likely peritumoral related to early RT response  Plan:  Continue radiotherapy as planned. Reviewed importance of practicing swallowing courses with Vallery Sa and to stay hydrated. Goal: keep weight stable.   IVF today.  Continue fluconazole. ________________________________   Eppie Gibson, M.D.   This document serves as a record of services personally performed by Eppie Gibson, MD. It was created on her behalf by Derek Mound, a trained medical scribe. The creation of this record is based on the scribe's personal observations and the provider's statements to them. This document has been checked and approved by the attending provider.

## 2014-04-26 NOTE — Progress Notes (Signed)
Weekly rad tx b/l neck, 15 treatments completed, erythema on neck, using biafine bid, not using anything for pain, not eating or drinking much, dry chapped lips, gave aquaphor to patient, still has thrush,taking diflucan , carafte , has peg tube hasn't eaten anything or put anything through his peg tube today,due for IVF'S now  2:54 PM BP 109/77 mmHg  Pulse 78  Temp(Src) 98.6 F (37 C) (Oral)  Resp 20  Wt 184 lb (83.462 kg)  Wt Readings from Last 3 Encounters:  04/21/14 186 lb (84.369 kg)  04/21/14 185 lb 1.6 oz (83.961 kg)  04/05/14 190 lb (86.183 kg)

## 2014-04-27 ENCOUNTER — Other Ambulatory Visit: Payer: Self-pay | Admitting: *Deleted

## 2014-04-27 ENCOUNTER — Other Ambulatory Visit (HOSPITAL_BASED_OUTPATIENT_CLINIC_OR_DEPARTMENT_OTHER): Payer: Medicare Other

## 2014-04-27 ENCOUNTER — Ambulatory Visit (HOSPITAL_BASED_OUTPATIENT_CLINIC_OR_DEPARTMENT_OTHER): Payer: Medicare Other | Admitting: Hematology and Oncology

## 2014-04-27 ENCOUNTER — Encounter: Payer: Self-pay | Admitting: *Deleted

## 2014-04-27 ENCOUNTER — Ambulatory Visit (HOSPITAL_BASED_OUTPATIENT_CLINIC_OR_DEPARTMENT_OTHER): Payer: Medicare Other

## 2014-04-27 ENCOUNTER — Telehealth: Payer: Self-pay | Admitting: *Deleted

## 2014-04-27 ENCOUNTER — Ambulatory Visit
Admission: RE | Admit: 2014-04-27 | Discharge: 2014-04-27 | Disposition: A | Discharge status: Home / Self Care | Payer: Medicare Other | Source: Ambulatory Visit | Attending: Radiation Oncology | Admitting: Radiation Oncology

## 2014-04-27 DIAGNOSIS — R634 Abnormal weight loss: Secondary | ICD-10-CM | POA: Diagnosis not present

## 2014-04-27 DIAGNOSIS — D701 Agranulocytosis secondary to cancer chemotherapy: Secondary | ICD-10-CM

## 2014-04-27 DIAGNOSIS — T451X5A Adverse effect of antineoplastic and immunosuppressive drugs, initial encounter: Secondary | ICD-10-CM

## 2014-04-27 DIAGNOSIS — R110 Nausea: Secondary | ICD-10-CM | POA: Diagnosis not present

## 2014-04-27 DIAGNOSIS — E860 Dehydration: Secondary | ICD-10-CM

## 2014-04-27 DIAGNOSIS — K1231 Oral mucositis (ulcerative) due to antineoplastic therapy: Secondary | ICD-10-CM

## 2014-04-27 DIAGNOSIS — C01 Malignant neoplasm of base of tongue: Secondary | ICD-10-CM | POA: Diagnosis not present

## 2014-04-27 DIAGNOSIS — F411 Generalized anxiety disorder: Secondary | ICD-10-CM | POA: Diagnosis not present

## 2014-04-27 DIAGNOSIS — L598 Other specified disorders of the skin and subcutaneous tissue related to radiation: Secondary | ICD-10-CM | POA: Diagnosis not present

## 2014-04-27 DIAGNOSIS — B370 Candidal stomatitis: Secondary | ICD-10-CM | POA: Diagnosis not present

## 2014-04-27 DIAGNOSIS — K1233 Oral mucositis (ulcerative) due to radiation: Secondary | ICD-10-CM | POA: Diagnosis not present

## 2014-04-27 DIAGNOSIS — Z510 Encounter for antineoplastic radiation therapy: Secondary | ICD-10-CM | POA: Diagnosis not present

## 2014-04-27 LAB — CBC WITH DIFFERENTIAL/PLATELET
BASO%: 0.6 % (ref 0.0–2.0)
BASOS ABS: 0 10*3/uL (ref 0.0–0.1)
EOS%: 0.6 % (ref 0.0–7.0)
Eosinophils Absolute: 0 10*3/uL (ref 0.0–0.5)
HCT: 34.9 % — ABNORMAL LOW (ref 38.4–49.9)
HGB: 12.1 g/dL — ABNORMAL LOW (ref 13.0–17.1)
LYMPH%: 22.3 % (ref 14.0–49.0)
MCH: 30.4 pg (ref 27.2–33.4)
MCHC: 34.7 g/dL (ref 32.0–36.0)
MCV: 87.7 fL (ref 79.3–98.0)
MONO#: 0.4 10*3/uL (ref 0.1–0.9)
MONO%: 21.7 % — AB (ref 0.0–14.0)
NEUT#: 0.9 10*3/uL — ABNORMAL LOW (ref 1.5–6.5)
NEUT%: 54.8 % (ref 39.0–75.0)
PLATELETS: 295 10*3/uL (ref 140–400)
RBC: 3.98 10*6/uL — ABNORMAL LOW (ref 4.20–5.82)
RDW: 12.5 % (ref 11.0–14.6)
WBC: 1.7 10*3/uL — ABNORMAL LOW (ref 4.0–10.3)
lymph#: 0.4 10*3/uL — ABNORMAL LOW (ref 0.9–3.3)

## 2014-04-27 LAB — COMPREHENSIVE METABOLIC PANEL (CC13)
ALBUMIN: 3.1 g/dL — AB (ref 3.5–5.0)
ALT: 25 U/L (ref 0–55)
AST: 22 U/L (ref 5–34)
Alkaline Phosphatase: 95 U/L (ref 40–150)
Anion Gap: 12 mEq/L — ABNORMAL HIGH (ref 3–11)
BILIRUBIN TOTAL: 0.37 mg/dL (ref 0.20–1.20)
BUN: 19.7 mg/dL (ref 7.0–26.0)
CALCIUM: 9.4 mg/dL (ref 8.4–10.4)
CO2: 22 mEq/L (ref 22–29)
Chloride: 101 mEq/L (ref 98–109)
Creatinine: 0.9 mg/dL (ref 0.7–1.3)
EGFR: 83 mL/min/{1.73_m2} — ABNORMAL LOW (ref >90)
Glucose: 129 mg/dl (ref 70–140)
Potassium: 4.4 mEq/L (ref 3.5–5.1)
Sodium: 135 mEq/L — ABNORMAL LOW (ref 136–145)
Total Protein: 6.7 g/dL (ref 6.4–8.3)

## 2014-04-27 LAB — MAGNESIUM (CC13): MAGNESIUM: 2.1 mg/dL (ref 1.5–2.5)

## 2014-04-27 MED ORDER — SODIUM CHLORIDE 0.9 % IJ SOLN
10.0000 mL | INTRAMUSCULAR | Status: DC | PRN
  Administered 2014-04-27: 10 mL
  Filled 2014-04-27: qty 10

## 2014-04-27 MED ORDER — SODIUM CHLORIDE 0.9 % IV SOLN
Freq: Once | INTRAVENOUS | Status: AC
  Administered 2014-04-27: 15:00:00 via INTRAVENOUS

## 2014-04-27 MED ORDER — HEPARIN SOD (PORK) LOCK FLUSH 100 UNIT/ML IV SOLN
500.0000 [IU] | Freq: Once | INTRAVENOUS | Status: AC | PRN
  Administered 2014-04-27: 500 [IU]
  Filled 2014-04-27: qty 5

## 2014-04-27 NOTE — Patient Instructions (Signed)
Dehydration, Adult Dehydration is when you lose more fluids from the body than you take in. Vital organs like the kidneys, brain, and heart cannot function without a proper amount of fluids and salt. Any loss of fluids from the body can cause dehydration.  CAUSES   Vomiting.  Diarrhea.  Excessive sweating.  Excessive urine output.  Fever. SYMPTOMS  Mild dehydration  Thirst.  Dry lips.  Slightly dry mouth. Moderate dehydration  Very dry mouth.  Sunken eyes.  Skin does not bounce back quickly when lightly pinched and released.  Dark urine and decreased urine production.  Decreased tear production.  Headache. Severe dehydration  Very dry mouth.  Extreme thirst.  Rapid, weak pulse (more than 100 beats per minute at rest).  Cold hands and feet.  Not able to sweat in spite of heat and temperature.  Rapid breathing.  Blue lips.  Confusion and lethargy.  Difficulty being awakened.  Minimal urine production.  No tears. DIAGNOSIS  Your caregiver will diagnose dehydration based on your symptoms and your exam. Blood and urine tests will help confirm the diagnosis. The diagnostic evaluation should also identify the cause of dehydration. TREATMENT  Treatment of mild or moderate dehydration can often be done at home by increasing the amount of fluids that you drink. It is best to drink small amounts of fluid more often. Drinking too much at one time can make vomiting worse. Refer to the home care instructions below. Severe dehydration needs to be treated at the hospital where you will probably be given intravenous (IV) fluids that contain water and electrolytes. HOME CARE INSTRUCTIONS   Ask your caregiver about specific rehydration instructions.  Drink enough fluids to keep your urine clear or pale yellow.  Drink small amounts frequently if you have nausea and vomiting.  Eat as you normally do.  Avoid:  Foods or drinks high in sugar.  Carbonated  drinks.  Juice.  Extremely hot or cold fluids.  Drinks with caffeine.  Fatty, greasy foods.  Alcohol.  Tobacco.  Overeating.  Gelatin desserts.  Wash your hands well to avoid spreading bacteria and viruses.  Only take over-the-counter or prescription medicines for pain, discomfort, or fever as directed by your caregiver.  Ask your caregiver if you should continue all prescribed and over-the-counter medicines.  Keep all follow-up appointments with your caregiver. SEEK MEDICAL CARE IF:  You have abdominal pain and it increases or stays in one area (localizes).  You have a rash, stiff neck, or severe headache.  You are irritable, sleepy, or difficult to awaken.  You are weak, dizzy, or extremely thirsty. SEEK IMMEDIATE MEDICAL CARE IF:   You are unable to keep fluids down or you get worse despite treatment.  You have frequent episodes of vomiting or diarrhea.  You have blood or green matter (bile) in your vomit.  You have blood in your stool or your stool looks black and tarry.  You have not urinated in 6 to 8 hours, or you have only urinated a small amount of very dark urine.  You have a fever.  You faint. MAKE SURE YOU:   Understand these instructions.  Will watch your condition.  Will get help right away if you are not doing well or get worse. Document Released: 12/18/2004 Document Revised: 03/12/2011 Document Reviewed: 08/07/2010 ExitCare Patient Information 2015 [employer]. This information is not intended to replace advice given to you by your health care provider. Make sure you discuss any questions you have with your health care   provider.  

## 2014-04-27 NOTE — Telephone Encounter (Signed)
Returned wife's VM re: her attendance during appt with Dr. Alvy Bimler who plans to see patient in Infusion.   1. She stated patient is presently driving himself to RT and other appts, is very insistent about doing so.  We discussed appropriateness of continued driving has RT SEs increase.  She indicated it would be helpful for patient to hear this from MD, perhaps Dr. Isidore Moos next Monday. 2. I encouraged her to come in for Dr. Calton Dach appt to insure understanding of information that would otherwise be relayed by patient.  Gayleen Orem, RN, BSN, [place] at [place] [phone]

## 2014-04-28 ENCOUNTER — Ambulatory Visit
Admission: RE | Admit: 2014-04-28 | Discharge: 2014-04-28 | Disposition: A | Discharge status: Home / Self Care | Payer: Medicare Other | Source: Ambulatory Visit | Attending: Radiation Oncology | Admitting: Radiation Oncology

## 2014-04-28 ENCOUNTER — Telehealth: Payer: Self-pay | Admitting: Hematology and Oncology

## 2014-04-28 ENCOUNTER — Telehealth: Payer: Self-pay | Admitting: *Deleted

## 2014-04-28 ENCOUNTER — Ambulatory Visit (HOSPITAL_BASED_OUTPATIENT_CLINIC_OR_DEPARTMENT_OTHER): Payer: Medicare Other

## 2014-04-28 ENCOUNTER — Encounter: Payer: Self-pay | Admitting: Hematology and Oncology

## 2014-04-28 VITALS — BP 136/62 | HR 82 | Temp 98.3°F | Resp 18

## 2014-04-28 DIAGNOSIS — C01 Malignant neoplasm of base of tongue: Secondary | ICD-10-CM | POA: Diagnosis not present

## 2014-04-28 DIAGNOSIS — T451X5A Adverse effect of antineoplastic and immunosuppressive drugs, initial encounter: Secondary | ICD-10-CM

## 2014-04-28 DIAGNOSIS — D701 Agranulocytosis secondary to cancer chemotherapy: Secondary | ICD-10-CM | POA: Insufficient documentation

## 2014-04-28 DIAGNOSIS — B370 Candidal stomatitis: Secondary | ICD-10-CM | POA: Diagnosis not present

## 2014-04-28 DIAGNOSIS — E860 Dehydration: Secondary | ICD-10-CM | POA: Diagnosis not present

## 2014-04-28 DIAGNOSIS — L598 Other specified disorders of the skin and subcutaneous tissue related to radiation: Secondary | ICD-10-CM | POA: Diagnosis not present

## 2014-04-28 DIAGNOSIS — F411 Generalized anxiety disorder: Secondary | ICD-10-CM | POA: Diagnosis not present

## 2014-04-28 DIAGNOSIS — K1233 Oral mucositis (ulcerative) due to radiation: Secondary | ICD-10-CM | POA: Diagnosis not present

## 2014-04-28 DIAGNOSIS — Z510 Encounter for antineoplastic radiation therapy: Secondary | ICD-10-CM | POA: Diagnosis not present

## 2014-04-28 HISTORY — DX: Adverse effect of antineoplastic and immunosuppressive drugs, initial encounter: D70.1

## 2014-04-28 MED ORDER — SODIUM CHLORIDE 0.9 % IJ SOLN
10.0000 mL | INTRAMUSCULAR | Status: DC | PRN
  Administered 2014-04-28: 10 mL
  Filled 2014-04-28: qty 10

## 2014-04-28 MED ORDER — SODIUM CHLORIDE 0.9 % IV SOLN
1000.0000 mL | Freq: Once | INTRAVENOUS | Status: AC
  Administered 2014-04-28: 12:00:00 via INTRAVENOUS

## 2014-04-28 MED ORDER — HEPARIN SOD (PORK) LOCK FLUSH 100 UNIT/ML IV SOLN
500.0000 [IU] | Freq: Once | INTRAVENOUS | Status: AC | PRN
  Administered 2014-04-28: 500 [IU]
  Filled 2014-04-28: qty 5

## 2014-04-28 NOTE — Assessment & Plan Note (Signed)
The patient is starting to experience significant side effects from treatment. He is currently neutropenic and his treatment for cycle 2 chemotherapy would be delayed by a week. I will continue supportive care for now

## 2014-04-28 NOTE — Assessment & Plan Note (Signed)
He has significant weight loss recently and is only nutritional supplements for cans per day. I told the patient he needs to increase it to 6 cans minimum per day and I will get dietitian to continue to follow him closely on a weekly basis if possible

## 2014-04-28 NOTE — Telephone Encounter (Signed)
lines busy.Marland KitchenMarland KitchenMarland KitchenMarland Kitchenpt will get print out in chemo

## 2014-04-28 NOTE — Assessment & Plan Note (Signed)
He has significant mucositis. He has not used his pain medication recently and I encouraged him to use it

## 2014-04-28 NOTE — Assessment & Plan Note (Signed)
He has significant nausea with cycle 1 of treatment. He denies further nausea since we started him on IV fluids daily. I will continue the same for now.

## 2014-04-28 NOTE — Assessment & Plan Note (Signed)
This is related to side effects of treatment. I will hold chemotherapy and daily by a week.

## 2014-04-28 NOTE — Patient Instructions (Signed)
Dehydration, Adult Dehydration is when you lose more fluids from the body than you take in. Vital organs like the kidneys, brain, and heart cannot function without a proper amount of fluids and salt. Any loss of fluids from the body can cause dehydration.  CAUSES   Vomiting.  Diarrhea.  Excessive sweating.  Excessive urine output.  Fever. SYMPTOMS  Mild dehydration  Thirst.  Dry lips.  Slightly dry mouth. Moderate dehydration  Very dry mouth.  Sunken eyes.  Skin does not bounce back quickly when lightly pinched and released.  Dark urine and decreased urine production.  Decreased tear production.  Headache. Severe dehydration  Very dry mouth.  Extreme thirst.  Rapid, weak pulse (more than 100 beats per minute at rest).  Cold hands and feet.  Not able to sweat in spite of heat and temperature.  Rapid breathing.  Blue lips.  Confusion and lethargy.  Difficulty being awakened.  Minimal urine production.  No tears. DIAGNOSIS  Your caregiver will diagnose dehydration based on your symptoms and your exam. Blood and urine tests will help confirm the diagnosis. The diagnostic evaluation should also identify the cause of dehydration. TREATMENT  Treatment of mild or moderate dehydration can often be done at home by increasing the amount of fluids that you drink. It is best to drink small amounts of fluid more often. Drinking too much at one time can make vomiting worse. Refer to the home care instructions below. Severe dehydration needs to be treated at the hospital where you will probably be given intravenous (IV) fluids that contain water and electrolytes. HOME CARE INSTRUCTIONS   Ask your caregiver about specific rehydration instructions.  Drink enough fluids to keep your urine clear or pale yellow.  Drink small amounts frequently if you have nausea and vomiting.  Eat as you normally do.  Avoid:  Foods or drinks high in sugar.  Carbonated  drinks.  Juice.  Extremely hot or cold fluids.  Drinks with caffeine.  Fatty, greasy foods.  Alcohol.  Tobacco.  Overeating.  Gelatin desserts.  Wash your hands well to avoid spreading bacteria and viruses.  Only take over-the-counter or prescription medicines for pain, discomfort, or fever as directed by your caregiver.  Ask your caregiver if you should continue all prescribed and over-the-counter medicines.  Keep all follow-up appointments with your caregiver. SEEK MEDICAL CARE IF:  You have abdominal pain and it increases or stays in one area (localizes).  You have a rash, stiff neck, or severe headache.  You are irritable, sleepy, or difficult to awaken.  You are weak, dizzy, or extremely thirsty. SEEK IMMEDIATE MEDICAL CARE IF:   You are unable to keep fluids down or you get worse despite treatment.  You have frequent episodes of vomiting or diarrhea.  You have blood or green matter (bile) in your vomit.  You have blood in your stool or your stool looks black and tarry.  You have not urinated in 6 to 8 hours, or you have only urinated a small amount of very dark urine.  You have a fever.  You faint. MAKE SURE YOU:   Understand these instructions.  Will watch your condition.  Will get help right away if you are not doing well or get worse. Document Released: 12/18/2004 Document Revised: 03/12/2011 Document Reviewed: 08/07/2010 ExitCare Patient Information 2015 [employer]. This information is not intended to replace advice given to you by your health care provider. Make sure you discuss any questions you have with your health care   provider.  

## 2014-04-28 NOTE — Telephone Encounter (Signed)
Patient's wife called to inquire about appt time for today's IVF.  (Currently not scheduled.)  I indicated I would check with Infusion and get back to her.  Gayleen Orem, RN, BSN, [place] at [place] [phone]

## 2014-04-28 NOTE — Assessment & Plan Note (Signed)
He has minimum oral fluid intake over the weekend. I'm concerned about dehydration. I recommend extending his IV fluids to include started a second continue daily IV fluids until he completes all his treatment

## 2014-04-28 NOTE — Telephone Encounter (Signed)
LVMs for patient wife and business partner that today's IVF is scheduled for 11:30.  Gayleen Orem, RN, BSN, [place] at [place] [phone]

## 2014-04-28 NOTE — Progress Notes (Signed)
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 She is seen at the infusion center prior to cycle 2 of chemotherapy. Since I saw him last week, he continues to receive IV fluids daily which he found helpful. He has mild mucositis pain, rating it at 4 out of 10 pain. He has swallowing difficulties due to altered taste sensation. He is not drinking much oral fluid intake and is only using 4 cans of nutritional supplement per day. He denies recent nausea since I saw him last week. He has mild constipation.  REVIEW OF SYSTEMS:   Constitutional: Denies fevers, chills or abnormal weight loss Eyes: Denies blurriness of vision Respiratory: Denies cough, dyspnea or wheezes Cardiovascular: Denies palpitation, chest discomfort or lower extremity swelling Skin: Denies abnormal skin rashes Lymphatics: Denies new lymphadenopathy or easy bruising Neurological:Denies numbness, tingling or new  weaknesses Behavioral/Psych: Mood is stable, no new changes  All other systems were reviewed with the patient and are negative.  I have reviewed the past medical history, past surgical history, social history and family history with the patient and they are unchanged from previous note.  ALLERGIES:  has No Known Allergies.  MEDICATIONS:  Current Outpatient Prescriptions  Medication Sig Dispense Refill  . amphetamine-dextroamphetamine (ADDERALL) 20 MG tablet Take 20 mg by mouth 2 (two) times daily.    Marland Kitchen aspirin 81 MG tablet Take 81 mg by mouth daily.    Marland Kitchen atorvastatin (LIPITOR) 10 MG tablet Take 10 mg by mouth daily.    . fentaNYL (DURAGESIC - DOSED MCG/HR) 25 MCG/HR patch Place 1 patch (25 mcg total) onto the skin every 3 (three) days. (Patient not taking: Reported on 04/26/2014) 5 patch 0  . fluconazole (DIFLUCAN) 100 MG tablet Take 2 tablets today, then 1 tablet daily x 20 more days for yeast in mouth. 22 tablet 0  . levothyroxine (SYNTHROID, LEVOTHROID) 75 MCG tablet Take 75 mcg by mouth daily before breakfast.     . lidocaine (XYLOCAINE) 2 % solution Patient: Mix 1part 2% viscous lidocaine, 1part H20. Swish and/or swallow 34mL of this mixture, 76min before meals and at bedtime, up to QID 100 mL 5  . lidocaine-prilocaine (EMLA) cream Apply to affected area once 30 g 3  . LORazepam (ATIVAN) 0.5 MG tablet Take 1 tablet approximately 30 min prior to radiotherapy for anxiety/claustrophobia 40 tablet 0  . Morphine Sulfate (MORPHINE CONCENTRATE) 10 MG/0.5ML SOLN concentrated solution Take 0.5 mLs (10 mg total) by mouth every 2 (two) hours as needed. (Patient not taking:  Reported on 04/26/2014) 240 mL 0  . Multiple Vitamin (MULTIVITAMIN WITH MINERALS) TABS tablet Take 1 tablet by mouth daily.    . ondansetron (ZOFRAN) 8 MG tablet Take 1 tablet (8 mg total) by mouth every 8 (eight) hours as needed. 30 tablet 1  . prochlorperazine (COMPAZINE) 10 MG tablet Take 1 tablet (10 mg total) by mouth every 6  (six) hours as needed (Nausea or vomiting). (Patient not taking: Reported on 04/26/2014) 30 tablet 1  . ranitidine (ZANTAC) 150 MG/10ML syrup Take 10 mLs (150 mg total) by mouth 2 (two) times daily. May flush into PEG tube. To prevent indigestion. 300 mL 3  . sodium fluoride (FLUORISHIELD) 1.1 % GEL dental gel Instill one drop of gel per tooth space of fluoride tray. Place over teeth for 5 minutes. Remove. Spit out excess. Repeat nightly. 120 mL prn  . sucralfate (CARAFATE) 1 G tablet Dissolve 1 tablet in 10 mL H20 and swallow QID PRN sore throat. 40 tablet 5  . Wound Dressings (RADIAGEL) GEL Apply topically 2 (two) times daily.     No current facility-administered medications for this visit.    PHYSICAL EXAMINATION: ECOG PERFORMANCE STATUS: 1 - Symptomatic but completely ambulatory GENERAL:alert, no distress and comfortable SKIN: skin color, texture, turgor are normal, no rashes or significant lesions. Noted mild radiation-induced skin dermatitis EYES: normal, Conjunctiva are pink and non-injected, sclera clear OROPHARYNX: He has dry mucous membranes with mucositis. No thrush.  NECK: supple, thyroid normal size, non-tender, without nodularity LYMPH:  Previously palpable lymphadenopathy is about the same, slightly smaller LUNGS: clear to auscultation and percussion with normal breathing effort HEART: regular rate & rhythm and no murmurs and no lower extremity edema ABDOMEN:abdomen soft, non-tender and normal bowel sounds. Feeding tube site looks okay Musculoskeletal:no cyanosis of digits and no clubbing  NEURO: alert & oriented x 3 with fluent speech, no focal motor/sensory deficits  LABORATORY DATA:  I have reviewed the data as listed    Component Value Date/Time   NA 135* 04/27/2014 1409   NA 139 04/02/2014 1230   K 4.4 04/27/2014 1409   K 4.2 04/02/2014 1230   CL 105 04/02/2014 1230   CO2 22 04/27/2014 1409   CO2 25 04/02/2014 1230   GLUCOSE 129 04/27/2014 1409   GLUCOSE 87  04/02/2014 1230   BUN 19.7 04/27/2014 1409   BUN 21 04/02/2014 1230   CREATININE 0.9 04/27/2014 1409   CREATININE 0.85 04/02/2014 1230   CALCIUM 9.4 04/27/2014 1409   CALCIUM 9.6 04/02/2014 1230   PROT 6.7 04/27/2014 1409   PROT 7.0 12/30/2012 0545   ALBUMIN 3.1* 04/27/2014 1409   ALBUMIN 3.7 12/30/2012 0545   AST 22 04/27/2014 1409   AST 27 12/30/2012 0545   ALT 25 04/27/2014 1409   ALT 31 12/30/2012 0545   ALKPHOS 95 04/27/2014 1409   ALKPHOS 86 12/30/2012 0545   BILITOT 0.37 04/27/2014 1409   BILITOT 0.7 12/30/2012 0545   GFRNONAA 85* 04/02/2014 1230   GFRAA >90 04/02/2014 1230    No results found for: SPEP, UPEP  Lab Results  Component Value Date   WBC 1.7* 04/27/2014   NEUTROABS 0.9* 04/27/2014   HGB 12.1* 04/27/2014   HCT 34.9* 04/27/2014   MCV 87.7 04/27/2014   PLT 295 04/27/2014      Chemistry      Component Value Date/Time   NA 135* 04/27/2014 1409   NA 139 04/02/2014 1230   K 4.4 04/27/2014 1409   K 4.2 04/02/2014 1230  CL 105 04/02/2014 1230   CO2 22 04/27/2014 1409   CO2 25 04/02/2014 1230   BUN 19.7 04/27/2014 1409   BUN 21 04/02/2014 1230   CREATININE 0.9 04/27/2014 1409   CREATININE 0.85 04/02/2014 1230      Component Value Date/Time   CALCIUM 9.4 04/27/2014 1409   CALCIUM 9.6 04/02/2014 1230   ALKPHOS 95 04/27/2014 1409   ALKPHOS 86 12/30/2012 0545   AST 22 04/27/2014 1409   AST 27 12/30/2012 0545   ALT 25 04/27/2014 1409   ALT 31 12/30/2012 0545   BILITOT 0.37 04/27/2014 1409   BILITOT 0.7 12/30/2012 0545      ASSESSMENT & PLAN:  Malignant neoplasm of base of tongue The patient is starting to experience significant side effects from treatment. He is currently neutropenic and his treatment for cycle 2 chemotherapy would be delayed by a week. I will continue supportive care for now   Leukopenia due to antineoplastic chemotherapy This is related to side effects of treatment. I will hold chemotherapy and daily by a  week.   Mucositis due to chemotherapy He has significant mucositis. He has not used his pain medication recently and I encouraged him to use it   Weight loss He has significant weight loss recently and is only nutritional supplements for cans per day. I told the patient he needs to increase it to 6 cans minimum per day and I will get dietitian to continue to follow him closely on a weekly basis if possible   Dehydration He has minimum oral fluid intake over the weekend. I'm concerned about dehydration. I recommend extending his IV fluids to include started a second continue daily IV fluids until he completes all his treatment   Chemotherapy-induced nausea He has significant nausea with cycle 1 of treatment. He denies further nausea since we started him on IV fluids daily. I will continue the same for now.    No orders of the defined types were placed in this encounter.   All questions were answered. The patient knows to call the clinic with any problems, questions or concerns. No barriers to learning was detected. I spent 30 minutes counseling the patient face to face. The total time spent in the appointment was 40 minutes and more than 50% was on counseling and review of test results     [facility], [place], MD 04/28/2014 9:56 AM

## 2014-04-29 ENCOUNTER — Ambulatory Visit (HOSPITAL_BASED_OUTPATIENT_CLINIC_OR_DEPARTMENT_OTHER): Payer: Medicare Other

## 2014-04-29 ENCOUNTER — Ambulatory Visit
Admission: RE | Admit: 2014-04-29 | Discharge: 2014-04-29 | Disposition: A | Discharge status: Home / Self Care | Payer: Medicare Other | Source: Ambulatory Visit | Attending: Radiation Oncology | Admitting: Radiation Oncology

## 2014-04-29 VITALS — BP 124/67 | HR 74 | Temp 98.3°F | Resp 16

## 2014-04-29 DIAGNOSIS — B370 Candidal stomatitis: Secondary | ICD-10-CM | POA: Diagnosis not present

## 2014-04-29 DIAGNOSIS — C01 Malignant neoplasm of base of tongue: Secondary | ICD-10-CM | POA: Diagnosis not present

## 2014-04-29 DIAGNOSIS — E860 Dehydration: Secondary | ICD-10-CM

## 2014-04-29 DIAGNOSIS — Z510 Encounter for antineoplastic radiation therapy: Secondary | ICD-10-CM | POA: Diagnosis not present

## 2014-04-29 DIAGNOSIS — L598 Other specified disorders of the skin and subcutaneous tissue related to radiation: Secondary | ICD-10-CM | POA: Diagnosis not present

## 2014-04-29 DIAGNOSIS — F411 Generalized anxiety disorder: Secondary | ICD-10-CM | POA: Diagnosis not present

## 2014-04-29 DIAGNOSIS — K1233 Oral mucositis (ulcerative) due to radiation: Secondary | ICD-10-CM | POA: Diagnosis not present

## 2014-04-29 MED ORDER — SODIUM CHLORIDE 0.9 % IJ SOLN
10.0000 mL | INTRAMUSCULAR | Status: DC | PRN
  Administered 2014-04-29: 10 mL
  Filled 2014-04-29: qty 10

## 2014-04-29 MED ORDER — SODIUM CHLORIDE 0.9 % IV SOLN
Freq: Once | INTRAVENOUS | Status: AC
  Administered 2014-04-29: 15:00:00 via INTRAVENOUS

## 2014-04-29 MED ORDER — HEPARIN SOD (PORK) LOCK FLUSH 100 UNIT/ML IV SOLN
500.0000 [IU] | Freq: Once | INTRAVENOUS | Status: AC | PRN
  Administered 2014-04-29: 500 [IU]
  Filled 2014-04-29: qty 5

## 2014-04-29 MED ORDER — PROMETHAZINE HCL 25 MG/ML IJ SOLN
25.0000 mg | Freq: Once | INTRAMUSCULAR | Status: DC
  Filled 2014-04-29: qty 1

## 2014-04-29 NOTE — Patient Instructions (Signed)
Dehydration, Adult Dehydration is when you lose more fluids from the body than you take in. Vital organs like the kidneys, brain, and heart cannot function without a proper amount of fluids and salt. Any loss of fluids from the body can cause dehydration.  CAUSES   Vomiting.  Diarrhea.  Excessive sweating.  Excessive urine output.  Fever. SYMPTOMS  Mild dehydration  Thirst.  Dry lips.  Slightly dry mouth. Moderate dehydration  Very dry mouth.  Sunken eyes.  Skin does not bounce back quickly when lightly pinched and released.  Dark urine and decreased urine production.  Decreased tear production.  Headache. Severe dehydration  Very dry mouth.  Extreme thirst.  Rapid, weak pulse (more than 100 beats per minute at rest).  Cold hands and feet.  Not able to sweat in spite of heat and temperature.  Rapid breathing.  Blue lips.  Confusion and lethargy.  Difficulty being awakened.  Minimal urine production.  No tears. DIAGNOSIS  Your caregiver will diagnose dehydration based on your symptoms and your exam. Blood and urine tests will help confirm the diagnosis. The diagnostic evaluation should also identify the cause of dehydration. TREATMENT  Treatment of mild or moderate dehydration can often be done at home by increasing the amount of fluids that you drink. It is best to drink small amounts of fluid more often. Drinking too much at one time can make vomiting worse. Refer to the home care instructions below. Severe dehydration needs to be treated at the hospital where you will probably be given intravenous (IV) fluids that contain water and electrolytes. HOME CARE INSTRUCTIONS   Ask your caregiver about specific rehydration instructions.  Drink enough fluids to keep your urine clear or pale yellow.  Drink small amounts frequently if you have nausea and vomiting.  Eat as you normally do.  Avoid:  Foods or drinks high in sugar.  Carbonated  drinks.  Juice.  Extremely hot or cold fluids.  Drinks with caffeine.  Fatty, greasy foods.  Alcohol.  Tobacco.  Overeating.  Gelatin desserts.  Wash your hands well to avoid spreading bacteria and viruses.  Only take over-the-counter or prescription medicines for pain, discomfort, or fever as directed by your caregiver.  Ask your caregiver if you should continue all prescribed and over-the-counter medicines.  Keep all follow-up appointments with your caregiver. SEEK MEDICAL CARE IF:  You have abdominal pain and it increases or stays in one area (localizes).  You have a rash, stiff neck, or severe headache.  You are irritable, sleepy, or difficult to awaken.  You are weak, dizzy, or extremely thirsty. SEEK IMMEDIATE MEDICAL CARE IF:   You are unable to keep fluids down or you get worse despite treatment.  You have frequent episodes of vomiting or diarrhea.  You have blood or green matter (bile) in your vomit.  You have blood in your stool or your stool looks black and tarry.  You have not urinated in 6 to 8 hours, or you have only urinated a small amount of very dark urine.  You have a fever.  You faint. MAKE SURE YOU:   Understand these instructions.  Will watch your condition.  Will get help right away if you are not doing well or get worse. Document Released: 12/18/2004 Document Revised: 03/12/2011 Document Reviewed: 08/07/2010 ExitCare Patient Information 2015 [employer]. This information is not intended to replace advice given to you by your health care provider. Make sure you discuss any questions you have with your health care   provider.  

## 2014-04-30 ENCOUNTER — Ambulatory Visit (HOSPITAL_BASED_OUTPATIENT_CLINIC_OR_DEPARTMENT_OTHER): Payer: Medicare Other

## 2014-04-30 ENCOUNTER — Ambulatory Visit
Admission: RE | Admit: 2014-04-30 | Discharge: 2014-04-30 | Disposition: A | Discharge status: Home / Self Care | Payer: Medicare Other | Source: Ambulatory Visit | Attending: Radiation Oncology | Admitting: Radiation Oncology

## 2014-04-30 ENCOUNTER — Telehealth: Payer: Self-pay | Admitting: *Deleted

## 2014-04-30 VITALS — BP 114/68 | HR 71 | Temp 98.5°F | Resp 18

## 2014-04-30 DIAGNOSIS — E860 Dehydration: Secondary | ICD-10-CM | POA: Diagnosis not present

## 2014-04-30 DIAGNOSIS — C01 Malignant neoplasm of base of tongue: Secondary | ICD-10-CM | POA: Diagnosis not present

## 2014-04-30 DIAGNOSIS — F411 Generalized anxiety disorder: Secondary | ICD-10-CM | POA: Diagnosis not present

## 2014-04-30 DIAGNOSIS — Z510 Encounter for antineoplastic radiation therapy: Secondary | ICD-10-CM | POA: Diagnosis not present

## 2014-04-30 DIAGNOSIS — L598 Other specified disorders of the skin and subcutaneous tissue related to radiation: Secondary | ICD-10-CM | POA: Diagnosis not present

## 2014-04-30 DIAGNOSIS — B370 Candidal stomatitis: Secondary | ICD-10-CM | POA: Diagnosis not present

## 2014-04-30 DIAGNOSIS — K1233 Oral mucositis (ulcerative) due to radiation: Secondary | ICD-10-CM | POA: Diagnosis not present

## 2014-04-30 MED ORDER — SODIUM CHLORIDE 0.9 % IV SOLN
Freq: Once | INTRAVENOUS | Status: AC
  Administered 2014-04-30: 15:00:00 via INTRAVENOUS

## 2014-04-30 MED ORDER — SODIUM CHLORIDE 0.9 % IJ SOLN
10.0000 mL | INTRAMUSCULAR | Status: DC | PRN
  Administered 2014-04-30: 10 mL
  Filled 2014-04-30: qty 10

## 2014-04-30 MED ORDER — HEPARIN SOD (PORK) LOCK FLUSH 100 UNIT/ML IV SOLN
500.0000 [IU] | Freq: Once | INTRAVENOUS | Status: AC | PRN
  Administered 2014-04-30: 500 [IU]
  Filled 2014-04-30: qty 5

## 2014-04-30 NOTE — Telephone Encounter (Signed)
Returned patient's VM from this morning in which he asked if IVF appt could be moved up closer to RT.  After checking with Infusion, I returned his call, indicated that necessary for appt time to stand but if he can be taken early, he will be.  He verbalized understanding.  Gayleen Orem, RN, BSN, [place] at [place] [phone]

## 2014-04-30 NOTE — Patient Instructions (Signed)
Dehydration, Adult Dehydration is when you lose more fluids from the body than you take in. Vital organs like the kidneys, brain, and heart cannot function without a proper amount of fluids and salt. Any loss of fluids from the body can cause dehydration.  CAUSES   Vomiting.  Diarrhea.  Excessive sweating.  Excessive urine output.  Fever. SYMPTOMS  Mild dehydration  Thirst.  Dry lips.  Slightly dry mouth. Moderate dehydration  Very dry mouth.  Sunken eyes.  Skin does not bounce back quickly when lightly pinched and released.  Dark urine and decreased urine production.  Decreased tear production.  Headache. Severe dehydration  Very dry mouth.  Extreme thirst.  Rapid, weak pulse (more than 100 beats per minute at rest).  Cold hands and feet.  Not able to sweat in spite of heat and temperature.  Rapid breathing.  Blue lips.  Confusion and lethargy.  Difficulty being awakened.  Minimal urine production.  No tears. DIAGNOSIS  Your caregiver will diagnose dehydration based on your symptoms and your exam. Blood and urine tests will help confirm the diagnosis. The diagnostic evaluation should also identify the cause of dehydration. TREATMENT  Treatment of mild or moderate dehydration can often be done at home by increasing the amount of fluids that you drink. It is best to drink small amounts of fluid more often. Drinking too much at one time can make vomiting worse. Refer to the home care instructions below. Severe dehydration needs to be treated at the hospital where you will probably be given intravenous (IV) fluids that contain water and electrolytes. HOME CARE INSTRUCTIONS   Ask your caregiver about specific rehydration instructions.  Drink enough fluids to keep your urine clear or pale yellow.  Drink small amounts frequently if you have nausea and vomiting.  Eat as you normally do.  Avoid:  Foods or drinks high in sugar.  Carbonated  drinks.  Juice.  Extremely hot or cold fluids.  Drinks with caffeine.  Fatty, greasy foods.  Alcohol.  Tobacco.  Overeating.  Gelatin desserts.  Wash your hands well to avoid spreading bacteria and viruses.  Only take over-the-counter or prescription medicines for pain, discomfort, or fever as directed by your caregiver.  Ask your caregiver if you should continue all prescribed and over-the-counter medicines.  Keep all follow-up appointments with your caregiver. SEEK MEDICAL CARE IF:  You have abdominal pain and it increases or stays in one area (localizes).  You have a rash, stiff neck, or severe headache.  You are irritable, sleepy, or difficult to awaken.  You are weak, dizzy, or extremely thirsty. SEEK IMMEDIATE MEDICAL CARE IF:   You are unable to keep fluids down or you get worse despite treatment.  You have frequent episodes of vomiting or diarrhea.  You have blood or green matter (bile) in your vomit.  You have blood in your stool or your stool looks black and tarry.  You have not urinated in 6 to 8 hours, or you have only urinated a small amount of very dark urine.  You have a fever.  You faint. MAKE SURE YOU:   Understand these instructions.  Will watch your condition.  Will get help right away if you are not doing well or get worse. Document Released: 12/18/2004 Document Revised: 03/12/2011 Document Reviewed: 08/07/2010 ExitCare Patient Information 2015 [employer]. This information is not intended to replace advice given to you by your health care provider. Make sure you discuss any questions you have with your health care   provider.  

## 2014-04-30 NOTE — Progress Notes (Signed)
To provide support and encouragement, care continuity and to assess for needs, met with patient in Infusion where he was to be seen by Dr. Alvy Bimler.  He was accompanied by his wife. 1. He verbalized understanding that next cycle of chemo is being delayed a week d/t low WBC. 2. He reported he is:  Instilling 6 cans of supplement daily.  Not using MMW or Fentanyl to reduce throat pain.  He was encouraged by Dr. Alvy Bimler and this navigator to use these medications as prescribed. 3. He understands I can be contacted with needs/concerns.  Gayleen Orem, RN, BSN, [place] at [place] [phone]

## 2014-05-01 ENCOUNTER — Ambulatory Visit (HOSPITAL_BASED_OUTPATIENT_CLINIC_OR_DEPARTMENT_OTHER): Payer: Medicare Other

## 2014-05-01 VITALS — BP 132/70 | HR 75 | Temp 97.6°F | Resp 18

## 2014-05-01 DIAGNOSIS — C01 Malignant neoplasm of base of tongue: Secondary | ICD-10-CM

## 2014-05-01 DIAGNOSIS — E860 Dehydration: Secondary | ICD-10-CM | POA: Diagnosis not present

## 2014-05-01 MED ORDER — SODIUM CHLORIDE 0.9 % IV SOLN
Freq: Once | INTRAVENOUS | Status: AC
  Administered 2014-05-01: 08:00:00 via INTRAVENOUS

## 2014-05-01 MED ORDER — HEPARIN SOD (PORK) LOCK FLUSH 100 UNIT/ML IV SOLN
500.0000 [IU] | Freq: Once | INTRAVENOUS | Status: AC | PRN
  Administered 2014-05-01: 500 [IU]
  Filled 2014-05-01: qty 5

## 2014-05-01 MED ORDER — SODIUM CHLORIDE 0.9 % IJ SOLN
10.0000 mL | INTRAMUSCULAR | Status: DC | PRN
  Administered 2014-05-01: 10 mL
  Filled 2014-05-01: qty 10

## 2014-05-01 NOTE — Patient Instructions (Signed)

## 2014-05-03 ENCOUNTER — Ambulatory Visit (HOSPITAL_BASED_OUTPATIENT_CLINIC_OR_DEPARTMENT_OTHER): Payer: Medicare Other | Admitting: Hematology and Oncology

## 2014-05-03 ENCOUNTER — Ambulatory Visit
Admission: RE | Admit: 2014-05-03 | Discharge: 2014-05-03 | Disposition: A | Discharge status: Home / Self Care | Payer: Medicare Other | Source: Ambulatory Visit | Attending: Radiation Oncology | Admitting: Radiation Oncology

## 2014-05-03 ENCOUNTER — Ambulatory Visit (HOSPITAL_BASED_OUTPATIENT_CLINIC_OR_DEPARTMENT_OTHER): Payer: Medicare Other

## 2014-05-03 ENCOUNTER — Ambulatory Visit: Payer: Medicare Other | Attending: Radiation Oncology

## 2014-05-03 ENCOUNTER — Other Ambulatory Visit: Payer: Self-pay | Admitting: Hematology and Oncology

## 2014-05-03 ENCOUNTER — Encounter: Payer: Self-pay | Admitting: Hematology and Oncology

## 2014-05-03 ENCOUNTER — Ambulatory Visit: Payer: Self-pay

## 2014-05-03 ENCOUNTER — Other Ambulatory Visit (HOSPITAL_BASED_OUTPATIENT_CLINIC_OR_DEPARTMENT_OTHER): Payer: Medicare Other

## 2014-05-03 ENCOUNTER — Telehealth: Payer: Self-pay | Admitting: *Deleted

## 2014-05-03 ENCOUNTER — Encounter: Payer: Self-pay | Admitting: Radiation Oncology

## 2014-05-03 ENCOUNTER — Ambulatory Visit: Payer: Medicare Other

## 2014-05-03 VITALS — BP 125/62 | HR 74 | Temp 97.6°F | Resp 18

## 2014-05-03 VITALS — BP 119/62 | HR 78 | Temp 98.4°F | Resp 18

## 2014-05-03 DIAGNOSIS — E860 Dehydration: Secondary | ICD-10-CM

## 2014-05-03 DIAGNOSIS — C01 Malignant neoplasm of base of tongue: Secondary | ICD-10-CM

## 2014-05-03 DIAGNOSIS — F1020 Alcohol dependence, uncomplicated: Secondary | ICD-10-CM | POA: Insufficient documentation

## 2014-05-03 DIAGNOSIS — D701 Agranulocytosis secondary to cancer chemotherapy: Secondary | ICD-10-CM | POA: Diagnosis not present

## 2014-05-03 DIAGNOSIS — Z9189 Other specified personal risk factors, not elsewhere classified: Secondary | ICD-10-CM | POA: Diagnosis not present

## 2014-05-03 DIAGNOSIS — T451X5A Adverse effect of antineoplastic and immunosuppressive drugs, initial encounter: Secondary | ICD-10-CM

## 2014-05-03 DIAGNOSIS — B370 Candidal stomatitis: Secondary | ICD-10-CM | POA: Diagnosis not present

## 2014-05-03 DIAGNOSIS — R1310 Dysphagia, unspecified: Secondary | ICD-10-CM | POA: Diagnosis not present

## 2014-05-03 DIAGNOSIS — Z510 Encounter for antineoplastic radiation therapy: Secondary | ICD-10-CM | POA: Diagnosis not present

## 2014-05-03 DIAGNOSIS — F411 Generalized anxiety disorder: Secondary | ICD-10-CM | POA: Diagnosis not present

## 2014-05-03 DIAGNOSIS — R293 Abnormal posture: Secondary | ICD-10-CM | POA: Diagnosis not present

## 2014-05-03 DIAGNOSIS — H918X9 Other specified hearing loss, unspecified ear: Secondary | ICD-10-CM | POA: Diagnosis not present

## 2014-05-03 DIAGNOSIS — K1233 Oral mucositis (ulcerative) due to radiation: Secondary | ICD-10-CM | POA: Diagnosis not present

## 2014-05-03 DIAGNOSIS — K1231 Oral mucositis (ulcerative) due to antineoplastic therapy: Secondary | ICD-10-CM | POA: Diagnosis not present

## 2014-05-03 DIAGNOSIS — R110 Nausea: Secondary | ICD-10-CM

## 2014-05-03 DIAGNOSIS — M436 Torticollis: Secondary | ICD-10-CM | POA: Diagnosis not present

## 2014-05-03 DIAGNOSIS — L598 Other specified disorders of the skin and subcutaneous tissue related to radiation: Secondary | ICD-10-CM | POA: Diagnosis not present

## 2014-05-03 DIAGNOSIS — Z95828 Presence of other vascular implants and grafts: Secondary | ICD-10-CM

## 2014-05-03 LAB — CBC WITH DIFFERENTIAL/PLATELET
BASO%: 0.2 % (ref 0.0–2.0)
Basophils Absolute: 0 10*3/uL (ref 0.0–0.1)
EOS%: 0.4 % (ref 0.0–7.0)
Eosinophils Absolute: 0 10*3/uL (ref 0.0–0.5)
HEMATOCRIT: 34.9 % — AB (ref 38.4–49.9)
HEMOGLOBIN: 12.1 g/dL — AB (ref 13.0–17.1)
LYMPH%: 2.9 % — ABNORMAL LOW (ref 14.0–49.0)
MCH: 30.2 pg (ref 27.2–33.4)
MCHC: 34.7 g/dL (ref 32.0–36.0)
MCV: 87 fL (ref 79.3–98.0)
MONO#: 0.9 10*3/uL (ref 0.1–0.9)
MONO%: 18.6 % — ABNORMAL HIGH (ref 0.0–14.0)
NEUT#: 3.7 10*3/uL (ref 1.5–6.5)
NEUT%: 77.9 % — AB (ref 39.0–75.0)
Platelets: 354 10*3/uL (ref 140–400)
RBC: 4.01 10*6/uL — ABNORMAL LOW (ref 4.20–5.82)
RDW: 12.6 % (ref 11.0–14.6)
WBC: 4.8 10*3/uL (ref 4.0–10.3)
lymph#: 0.1 10*3/uL — ABNORMAL LOW (ref 0.9–3.3)
nRBC: 0 % (ref 0–0)

## 2014-05-03 LAB — COMPREHENSIVE METABOLIC PANEL (CC13)
ALT: 49 U/L (ref 0–55)
ANION GAP: 10 meq/L (ref 3–11)
AST: 37 U/L — ABNORMAL HIGH (ref 5–34)
Albumin: 3 g/dL — ABNORMAL LOW (ref 3.5–5.0)
Alkaline Phosphatase: 97 U/L (ref 40–150)
BUN: 19.5 mg/dL (ref 7.0–26.0)
CALCIUM: 9.8 mg/dL (ref 8.4–10.4)
CHLORIDE: 101 meq/L (ref 98–109)
CO2: 25 mEq/L (ref 22–29)
CREATININE: 0.9 mg/dL (ref 0.7–1.3)
EGFR: 86 mL/min/{1.73_m2} — ABNORMAL LOW (ref >90)
Glucose: 169 mg/dl — ABNORMAL HIGH (ref 70–140)
POTASSIUM: 4.4 meq/L (ref 3.5–5.1)
SODIUM: 136 meq/L (ref 136–145)
Total Bilirubin: 0.53 mg/dL (ref 0.20–1.20)
Total Protein: 7.1 g/dL (ref 6.4–8.3)

## 2014-05-03 LAB — MAGNESIUM (CC13): MAGNESIUM: 2.4 mg/dL (ref 1.5–2.5)

## 2014-05-03 MED ORDER — SODIUM CHLORIDE 0.9 % IJ SOLN
10.0000 mL | INTRAMUSCULAR | Status: DC | PRN
  Administered 2014-05-03: 10 mL
  Filled 2014-05-03: qty 10

## 2014-05-03 MED ORDER — SODIUM CHLORIDE 0.9 % IJ SOLN
10.0000 mL | INTRAMUSCULAR | Status: DC | PRN
  Administered 2014-05-03: 10 mL via INTRAVENOUS
  Filled 2014-05-03: qty 10

## 2014-05-03 MED ORDER — SODIUM CHLORIDE 0.9 % IV SOLN
Freq: Once | INTRAVENOUS | Status: AC
  Administered 2014-05-03: 13:00:00 via INTRAVENOUS

## 2014-05-03 MED ORDER — HEPARIN SOD (PORK) LOCK FLUSH 100 UNIT/ML IV SOLN
500.0000 [IU] | Freq: Once | INTRAVENOUS | Status: AC | PRN
  Administered 2014-05-03: 500 [IU]
  Filled 2014-05-03: qty 5

## 2014-05-03 NOTE — Assessment & Plan Note (Signed)
This is related to side effects of treatment. I will proceed with treatment this week and reduce dose by 20%.

## 2014-05-03 NOTE — Therapy (Signed)
[facility] [address] [place], [place], [address] Phone: [phone]   Fax:  [phone]  Speech Language Pathology Treatment  Patient Details  Name: Dylan Moreno MRN: [id number] Date of Birth: 23-Aug-1940 Referring Provider:  Seward Carol, MD  Encounter Date: 05/03/2014      End of Session - 05/03/14 1550    Visit Number 3   Number of Visits 3   Date for SLP Re-Evaluation 05/28/14   SLP Start Time 3710   SLP Stop Time  1548   SLP Time Calculation (min) 33 min      Past Medical History  Diagnosis Date  . Hyperlipemia   . Arthritis   . ADHD (attention deficit hyperactivity disorder)   . Wears glasses   . Hypercholesterolemia   . Peyronie's disease   . Kidney stones   . TIA (transient ischemic attack)   . Hypertension   . Anxiety   . GERD (gastroesophageal reflux disease)   . 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 TIA (transient ischemic attack) 12/29/12    left facial droop    Past Surgical History  Procedure Laterality Date  . Dupuytren contracture release  2012    left hand  . Tonsillectomy    . Shoulder arthroscopy w/ rotator cuff repair      left  . Colonoscopy    . Cystoscopy w/ ureteroscopy w/ lithotripsy  2011  . Hand surgery      right  . Dupuytren contracture release Right 10/30/2012    Procedure: DUPUYTREN CONTRACTURE RELEASE RIGHT PALM,RING AND SMALL FINGER;  Surgeon: Cammie Sickle., MD;  Location: [facility];  Service: Orthopedics;  Laterality: Right;    There were no vitals filed for this visit.  Visit Diagnosis: Dysphagia      Subjective Assessment - 05/03/14 1516    Subjective Pt's rad tx to end May 23rd. Is beginning 4th week rad. "How many of these exercises you want me to do?"               ADULT SLP TREATMENT - 05/03/14 1518    General Information   Behavior/Cognition Alert;Cooperative;Pleasant mood   Treatment  Provided   Treatment provided Dysphagia   Dysphagia Treatment   Patient observed directly with PO's Yes   Type of PO's observed Thin liquids   Oral Phase Signs & Symptoms --  none observed   Pharyngeal Phase Signs & Symptoms --  none observed   Other treatment/comments Pt reports completing HEP x1/day. Pt req'd min-mod A from SLP occasionally for HEP. He reported nutrition/hydration from PEG rather than POs. SLP reiterated to pt need for POs even after taking pain meds, in order to deter muscle disuse atrophy. SLP also told pt to complete HEP at least x2/day due to muscle fibrosis possiblity if done < recommended frequency. Pt benfitted from SLP cues for this as well as from demo and verbal cues for HEP.   Pain Assessment   Pain Score 0-No pain          SLP Education - 05/03/14 1550    Education provided Yes   Education Details HEP, muscle fibrosis and atrophy   Person(s) Educated Patient;Spouse   Methods Explanation;Demonstration;Verbal cues   Comprehension Verbalized understanding;Returned demonstration;Need further instruction;Verbal cues required          SLP Short Term Goals - 05/03/14 1554    SLP SHORT TERM GOAL #1   Title  pt will complete HEP with occasional min A   Time 1   Period --  visit   Status Partially Met   SLP SHORT TERM GOAL #2   Title pt will tell SLP why he is completing HEP with rare min A   Time 1   Period --  visit   Status Not Met          SLP Long Term Goals - 05/03/14 1555    SLP LONG TERM GOAL #1   Title pt will complete HEP with occasional min A   Time 1   Period --  visits   Status Revised   SLP LONG TERM GOAL #2   Title pt will tell SLP 3 signs/symptoms aspiration PNA   Time 1   Period --  visits   Status On-going   SLP LONG TERM GOAL #3   Title pt will tell SLP how a food journal can be helpful in return to full PO diet with rare min A   Time 1   Period --  visits   Status Revised          Plan - 05/03/14 1551     Clinical Impression Statement Pt now using PEG tube for primary nutrition/hydration. Skilled ST remains necessary to assess pt's performance with HEP as well as assess safety with POS.   Speech Therapy Frequency --  approx every four weeks   Duration --  until 05-28-14   Treatment/Interventions Pharyngeal strengthening exercises;Oral motor exercises;Compensatory strategies;Patient/family education;SLP instruction and feedback   Potential to Achieve Goals Good   Potential Considerations Ability to learn/carryover information        Problem List Patient Active Problem List   Diagnosis Date Noted  . Leukopenia due to antineoplastic chemotherapy 04/28/2014  . Mucositis due to chemotherapy 04/22/2014  . Dehydration 04/22/2014  . Weight loss 04/14/2014  . Chemotherapy-induced nausea 04/14/2014  . Chronic alcoholism 03/18/2014  . 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  . TIA (transient ischemic attack) 12/29/2012  . Hyperlipemia   . ADHD (attention deficit hyperactivity disorder)     Garald Balding, SLP 05/03/2014, 3:56 PM  [facility] [address] [place] [place], [place], [address] Phone: [phone]   Fax:  [phone]

## 2014-05-03 NOTE — Assessment & Plan Note (Signed)
The patient is starting to experience significant side effects from treatment. Neutropenia has resolved. We will resume his treatment in 2 days time with 20% dose adjustment. In addition, I will add daily dexamethasone for 2 days after chemotherapy to reduce the risk of nausea and vomiting. I will continue supportive care for now

## 2014-05-03 NOTE — Telephone Encounter (Signed)
Patient's wife called re: today's appts.  I provided clarification.  Gayleen Orem, RN, BSN, [place] at [place] [phone]

## 2014-05-03 NOTE — Patient Instructions (Signed)

## 2014-05-03 NOTE — Patient Instructions (Signed)
Dehydration, Adult Dehydration is when you lose more fluids from the body than you take in. Vital organs like the kidneys, brain, and heart cannot function without a proper amount of fluids and salt. Any loss of fluids from the body can cause dehydration.  CAUSES   Vomiting.  Diarrhea.  Excessive sweating.  Excessive urine output.  Fever. SYMPTOMS  Mild dehydration  Thirst.  Dry lips.  Slightly dry mouth. Moderate dehydration  Very dry mouth.  Sunken eyes.  Skin does not bounce back quickly when lightly pinched and released.  Dark urine and decreased urine production.  Decreased tear production.  Headache. Severe dehydration  Very dry mouth.  Extreme thirst.  Rapid, weak pulse (more than 100 beats per minute at rest).  Cold hands and feet.  Not able to sweat in spite of heat and temperature.  Rapid breathing.  Blue lips.  Confusion and lethargy.  Difficulty being awakened.  Minimal urine production.  No tears. DIAGNOSIS  Your caregiver will diagnose dehydration based on your symptoms and your exam. Blood and urine tests will help confirm the diagnosis. The diagnostic evaluation should also identify the cause of dehydration. TREATMENT  Treatment of mild or moderate dehydration can often be done at home by increasing the amount of fluids that you drink. It is best to drink small amounts of fluid more often. Drinking too much at one time can make vomiting worse. Refer to the home care instructions below. Severe dehydration needs to be treated at the hospital where you will probably be given intravenous (IV) fluids that contain water and electrolytes. HOME CARE INSTRUCTIONS   Ask your caregiver about specific rehydration instructions.  Drink enough fluids to keep your urine clear or pale yellow.  Drink small amounts frequently if you have nausea and vomiting.  Eat as you normally do.  Avoid:  Foods or drinks high in sugar.  Carbonated  drinks.  Juice.  Extremely hot or cold fluids.  Drinks with caffeine.  Fatty, greasy foods.  Alcohol.  Tobacco.  Overeating.  Gelatin desserts.  Wash your hands well to avoid spreading bacteria and viruses.  Only take over-the-counter or prescription medicines for pain, discomfort, or fever as directed by your caregiver.  Ask your caregiver if you should continue all prescribed and over-the-counter medicines.  Keep all follow-up appointments with your caregiver. SEEK MEDICAL CARE IF:  You have abdominal pain and it increases or stays in one area (localizes).  You have a rash, stiff neck, or severe headache.  You are irritable, sleepy, or difficult to awaken.  You are weak, dizzy, or extremely thirsty. SEEK IMMEDIATE MEDICAL CARE IF:   You are unable to keep fluids down or you get worse despite treatment.  You have frequent episodes of vomiting or diarrhea.  You have blood or green matter (bile) in your vomit.  You have blood in your stool or your stool looks black and tarry.  You have not urinated in 6 to 8 hours, or you have only urinated a small amount of very dark urine.  You have a fever.  You faint. MAKE SURE YOU:   Understand these instructions.  Will watch your condition.  Will get help right away if you are not doing well or get worse. Document Released: 12/18/2004 Document Revised: 03/12/2011 Document Reviewed: 08/07/2010 ExitCare Patient Information 2015 [employer]. This information is not intended to replace advice given to you by your health care provider. Make sure you discuss any questions you have with your health care   provider.  

## 2014-05-03 NOTE — Progress Notes (Signed)
Wt Readings from Last 3 Encounters:  05/03/14 188 lb 6.4 oz (85.458 kg)  04/21/14 186 lb (84.369 kg)  04/21/14 185 lb 1.6 oz (83.961 kg)  BP 129/60 mmHg  Pulse 73  Temp(Src) 98.4 F (36.9 C) (Oral)  Resp 20  Wt 188 lb 6.4 oz (85.458 kg)  SpO2 100%  IVF's NS at 500ML/hour right port a cath, neck with dermatitis rash, itches some stated patient, still has thrush, using radiaplex gel daily, will increase to 2-3x day after rad txs,takes carafate , putting liquid supplements via peg tube, 3 cans in so far today,, has appt with [facility] now at 245, paged MD, still has throat pain no appetite, fatigued 3:00 PM'

## 2014-05-03 NOTE — Assessment & Plan Note (Signed)
He has significant nausea with cycle 1 of treatment. He denies further nausea since we started him on IV fluids daily. I will continue the same for now. I will also add daily dexamethasone for 2 days after chemotherapy.

## 2014-05-03 NOTE — Assessment & Plan Note (Signed)
He has minimum oral fluid intake over the weekend. I'm concerned about dehydration. I recommend extending his IV fluids to include started a second continue daily IV fluids until he completes all his treatment

## 2014-05-03 NOTE — Progress Notes (Signed)
Weekly Management Note:  Site: Base of tongue/bilateral neck Current Dose:  4000  cGy Projected Dose: 7000  cGy  Narrative: The patient is seen today for routine under treatment assessment. CBCT/MVCT images/port films were reviewed. The chart was reviewed.   He does report a sore throat but this is tolerable.  He is not using any narcotics for his mucositis.  He will receive more chemotherapy this Wednesday.  His blood counts are improved.  He is PEG tube dependent.  His weight is up 4 pounds over the past week.  Physical Examination: There were no vitals filed for this visit..  Weight:  .  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  I believe there has been regression of his still palpable left neck adenopathy.  Oropharynx remarkable for confluent mucositis area his mouth is somewhat dry.  Laboratory data: Lab Results  Component Value Date   WBC 4.8 05/03/2014   HGB 12.1* 05/03/2014   HCT 34.9* 05/03/2014   MCV 87.0 05/03/2014   PLT 354 05/03/2014     Impression: Tolerating radiation therapy well.  He'll continue with chemotherapy this Wednesday.  Plan: Continue radiation therapy as planned.

## 2014-05-03 NOTE — Assessment & Plan Note (Signed)
He has significant mucositis. He has not used his pain medication recently and I encouraged him to use it

## 2014-05-03 NOTE — Progress Notes (Signed)
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 He returns for further follow-up prior to cycle 2 of treatment. Last week, his chemotherapy was delayed by 1 week due to neutropenia. Denies fevers or chills. He denies pain. He continued to have mild nausea. He is not able to swallow any liquids and is dependent on feeding tube for nutritional needs.  REVIEW OF SYSTEMS:   Constitutional: Denies fevers, chills or abnormal weight loss Eyes: Denies blurriness of vision Respiratory: Denies cough, dyspnea or wheezes Cardiovascular: Denies palpitation, chest discomfort or lower extremity swelling Gastrointestinal:  Denies nausea, heartburn or change in bowel habits Skin: Denies abnormal skin rashes Lymphatics: Denies new lymphadenopathy or easy bruising Neurological:Denies numbness, tingling or new weaknesses Behavioral/Psych: Mood is stable, no new changes  All other systems were  reviewed with the patient and are negative.  I have reviewed the past medical history, past surgical history, social history and family history with the patient and they are unchanged from previous note.  ALLERGIES:  has No Known Allergies.  MEDICATIONS:  Current Outpatient Prescriptions  Medication Sig Dispense Refill  . amphetamine-dextroamphetamine (ADDERALL) 20 MG tablet Take 20 mg by mouth 2 (two) times daily.    Marland Kitchen aspirin 81 MG tablet Take 81 mg by mouth daily.    Marland Kitchen atorvastatin (LIPITOR) 10 MG tablet Take 10 mg by mouth daily.    . fentaNYL (DURAGESIC - DOSED MCG/HR) 25 MCG/HR patch Place 1 patch (25 mcg total) onto the skin every 3 (three) days. (Patient not taking: Reported on 04/26/2014) 5 patch 0  . fluconazole (DIFLUCAN) 100 MG tablet Take 2 tablets today, then 1 tablet daily x 20 more days for yeast in mouth. 22 tablet 0  . levothyroxine (SYNTHROID, LEVOTHROID) 75 MCG tablet Take 75 mcg by mouth daily before breakfast.     . lidocaine (XYLOCAINE) 2 % solution Patient: Mix 1part 2% viscous lidocaine, 1part H20. Swish and/or swallow 24mL of this mixture, 63min before meals and at bedtime, up to QID 100 mL 5  . lidocaine-prilocaine (EMLA) cream Apply to affected area once 30 g 3  . LORazepam (ATIVAN) 0.5 MG tablet Take 1 tablet approximately 30 min prior to radiotherapy for anxiety/claustrophobia 40 tablet 0  . Morphine Sulfate (MORPHINE CONCENTRATE) 10 MG/0.5ML SOLN concentrated solution Take 0.5 mLs (10 mg total) by mouth every 2 (two) hours as needed. (Patient not taking: Reported on 05/03/2014) 240 mL 0  . Multiple Vitamin (MULTIVITAMIN WITH MINERALS) TABS tablet Take 1 tablet by mouth daily.    Marland Kitchen  ondansetron (ZOFRAN) 8 MG tablet Take 1 tablet (8 mg total) by mouth every 8 (eight) hours as needed. 30 tablet 1  . prochlorperazine (COMPAZINE) 10 MG tablet Take 1 tablet (10 mg total) by mouth every 6 (six) hours as needed (Nausea or vomiting). (Patient not taking: Reported on 04/26/2014)  30 tablet 1  . ranitidine (ZANTAC) 150 MG/10ML syrup Take 10 mLs (150 mg total) by mouth 2 (two) times daily. May flush into PEG tube. To prevent indigestion. 300 mL 3  . sodium fluoride (FLUORISHIELD) 1.1 % GEL dental gel Instill one drop of gel per tooth space of fluoride tray. Place over teeth for 5 minutes. Remove. Spit out excess. Repeat nightly. 120 mL prn  . sucralfate (CARAFATE) 1 G tablet Dissolve 1 tablet in 10 mL H20 and swallow QID PRN sore throat. 40 tablet 5  . Wound Dressings (RADIAGEL) GEL Apply topically 2 (two) times daily.     No current facility-administered medications for this visit.   Facility-Administered Medications Ordered in Other Visits  Medication Dose Route Frequency Provider Last Rate Last Dose  . sodium chloride 0.9 % injection 10 mL  10 mL Intracatheter PRN Heath Lark, MD   10 mL at 05/03/14 1507    PHYSICAL EXAMINATION: ECOG PERFORMANCE STATUS: 1 - Symptomatic but completely ambulatory  Filed Vitals:   05/03/14 1244  BP: 119/62  Pulse: 78  Temp: 98.4 F (36.9 C)  Resp: 18   There were no vitals filed for this visit.  GENERAL:alert, no distress and comfortable SKIN: skin color, texture, turgor are normal, no rashes or significant lesions. Noted mild dermatitis around his neck EYES: normal, Conjunctiva are pink and non-injected, sclera clear OROPHARYNX:no exudate, no erythema and lips, buccal mucosa, and tongue normal . Dry mucous membrane is noted NECK: supple, thyroid normal size, non-tender, without nodularity LYMPH:  Previously palpable lymphadenopathy has regressed a little bit in size.  LUNGS: clear to auscultation and percussion with normal breathing effort HEART: regular rate & rhythm and no murmurs and no lower extremity edema ABDOMEN:abdomen soft, non-tender and normal bowel sounds. Feeding tube site looks okay Musculoskeletal:no cyanosis of digits and no clubbing  NEURO: alert & oriented x 3 with fluent speech, no focal motor/sensory  deficits  LABORATORY DATA:  I have reviewed the data as listed    Component Value Date/Time   NA 136 05/03/2014 1142   NA 139 04/02/2014 1230   K 4.4 05/03/2014 1142   K 4.2 04/02/2014 1230   CL 105 04/02/2014 1230   CO2 25 05/03/2014 1142   CO2 25 04/02/2014 1230   GLUCOSE 169* 05/03/2014 1142   GLUCOSE 87 04/02/2014 1230   BUN 19.5 05/03/2014 1142   BUN 21 04/02/2014 1230   CREATININE 0.9 05/03/2014 1142   CREATININE 0.85 04/02/2014 1230   CALCIUM 9.8 05/03/2014 1142   CALCIUM 9.6 04/02/2014 1230   PROT 7.1 05/03/2014 1142   PROT 7.0 12/30/2012 0545   ALBUMIN 3.0* 05/03/2014 1142   ALBUMIN 3.7 12/30/2012 0545   AST 37* 05/03/2014 1142   AST 27 12/30/2012 0545   ALT 49 05/03/2014 1142   ALT 31 12/30/2012 0545   ALKPHOS 97 05/03/2014 1142   ALKPHOS 86 12/30/2012 0545   BILITOT 0.53 05/03/2014 1142   BILITOT 0.7 12/30/2012 0545   GFRNONAA 85* 04/02/2014 1230   GFRAA >90 04/02/2014 1230    No results found for: SPEP, UPEP  Lab Results  Component Value Date   WBC 4.8 05/03/2014   NEUTROABS 3.7  05/03/2014   HGB 12.1* 05/03/2014   HCT 34.9* 05/03/2014   MCV 87.0 05/03/2014   PLT 354 05/03/2014      Chemistry      Component Value Date/Time   NA 136 05/03/2014 1142   NA 139 04/02/2014 1230   K 4.4 05/03/2014 1142   K 4.2 04/02/2014 1230   CL 105 04/02/2014 1230   CO2 25 05/03/2014 1142   CO2 25 04/02/2014 1230   BUN 19.5 05/03/2014 1142   BUN 21 04/02/2014 1230   CREATININE 0.9 05/03/2014 1142   CREATININE 0.85 04/02/2014 1230      Component Value Date/Time   CALCIUM 9.8 05/03/2014 1142   CALCIUM 9.6 04/02/2014 1230   ALKPHOS 97 05/03/2014 1142   ALKPHOS 86 12/30/2012 0545   AST 37* 05/03/2014 1142   AST 27 12/30/2012 0545   ALT 49 05/03/2014 1142   ALT 31 12/30/2012 0545   BILITOT 0.53 05/03/2014 1142   BILITOT 0.7 12/30/2012 0545    ASSESSMENT & PLAN:  Malignant neoplasm of base of tongue The patient is starting to experience significant side  effects from treatment. Neutropenia has resolved. We will resume his treatment in 2 days time with 20% dose adjustment. In addition, I will add daily dexamethasone for 2 days after chemotherapy to reduce the risk of nausea and vomiting. I will continue supportive care for now     Chemotherapy-induced nausea He has significant nausea with cycle 1 of treatment. He denies further nausea since we started him on IV fluids daily. I will continue the same for now. I will also add daily dexamethasone for 2 days after chemotherapy.    Dehydration He has minimum oral fluid intake over the weekend. I'm concerned about dehydration. I recommend extending his IV fluids to include started a second continue daily IV fluids until he completes all his treatment   Leukopenia due to antineoplastic chemotherapy This is related to side effects of treatment. I will proceed with treatment this week and reduce dose by 20%.   Mucositis due to chemotherapy He has significant mucositis. He has not used his pain medication recently and I encouraged him to use it    No orders of the defined types were placed in this encounter.   All questions were answered. The patient knows to call the clinic with any problems, questions or concerns. No barriers to learning was detected. I spent 30 minutes counseling the patient face to face. The total time spent in the appointment was 40 minutes and more than 50% was on counseling and review of test results     [facility], [place], MD 05/03/2014 3:54 PM

## 2014-05-04 ENCOUNTER — Ambulatory Visit: Payer: Medicare Other | Admitting: Radiation Oncology

## 2014-05-04 ENCOUNTER — Ambulatory Visit
Admission: RE | Admit: 2014-05-04 | Discharge: 2014-05-04 | Disposition: A | Discharge status: Home / Self Care | Payer: Medicare Other | Source: Ambulatory Visit | Attending: Radiation Oncology | Admitting: Radiation Oncology

## 2014-05-04 ENCOUNTER — Ambulatory Visit (HOSPITAL_BASED_OUTPATIENT_CLINIC_OR_DEPARTMENT_OTHER): Payer: Medicare Other

## 2014-05-04 ENCOUNTER — Telehealth: Payer: Self-pay | Admitting: Hematology and Oncology

## 2014-05-04 VITALS — BP 113/50 | HR 72 | Temp 99.7°F | Resp 18

## 2014-05-04 DIAGNOSIS — C01 Malignant neoplasm of base of tongue: Secondary | ICD-10-CM

## 2014-05-04 DIAGNOSIS — K1233 Oral mucositis (ulcerative) due to radiation: Secondary | ICD-10-CM | POA: Diagnosis not present

## 2014-05-04 DIAGNOSIS — B370 Candidal stomatitis: Secondary | ICD-10-CM | POA: Diagnosis not present

## 2014-05-04 DIAGNOSIS — Z510 Encounter for antineoplastic radiation therapy: Secondary | ICD-10-CM | POA: Diagnosis not present

## 2014-05-04 DIAGNOSIS — L598 Other specified disorders of the skin and subcutaneous tissue related to radiation: Secondary | ICD-10-CM | POA: Diagnosis not present

## 2014-05-04 DIAGNOSIS — E860 Dehydration: Secondary | ICD-10-CM | POA: Diagnosis not present

## 2014-05-04 DIAGNOSIS — F411 Generalized anxiety disorder: Secondary | ICD-10-CM | POA: Diagnosis not present

## 2014-05-04 MED ORDER — HEPARIN SOD (PORK) LOCK FLUSH 100 UNIT/ML IV SOLN
500.0000 [IU] | Freq: Once | INTRAVENOUS | Status: AC
  Administered 2014-05-04: 500 [IU] via INTRAVENOUS
  Filled 2014-05-04: qty 5

## 2014-05-04 MED ORDER — SODIUM CHLORIDE 0.9 % IJ SOLN
10.0000 mL | INTRAMUSCULAR | Status: DC | PRN
  Administered 2014-05-04: 10 mL via INTRAVENOUS
  Filled 2014-05-04: qty 10

## 2014-05-04 MED ORDER — SODIUM CHLORIDE 0.9 % IV SOLN
1000.0000 mL | Freq: Once | INTRAVENOUS | Status: AC
  Administered 2014-05-04: 15:00:00 via INTRAVENOUS

## 2014-05-04 NOTE — Telephone Encounter (Signed)
ivf added per staff message

## 2014-05-04 NOTE — Patient Instructions (Signed)
Dehydration, Adult Dehydration is when you lose more fluids from the body than you take in. Vital organs like the kidneys, brain, and heart cannot function without a proper amount of fluids and salt. Any loss of fluids from the body can cause dehydration.  CAUSES   Vomiting.  Diarrhea.  Excessive sweating.  Excessive urine output.  Fever. SYMPTOMS  Mild dehydration  Thirst.  Dry lips.  Slightly dry mouth. Moderate dehydration  Very dry mouth.  Sunken eyes.  Skin does not bounce back quickly when lightly pinched and released.  Dark urine and decreased urine production.  Decreased tear production.  Headache. Severe dehydration  Very dry mouth.  Extreme thirst.  Rapid, weak pulse (more than 100 beats per minute at rest).  Cold hands and feet.  Not able to sweat in spite of heat and temperature.  Rapid breathing.  Blue lips.  Confusion and lethargy.  Difficulty being awakened.  Minimal urine production.  No tears. DIAGNOSIS  Your caregiver will diagnose dehydration based on your symptoms and your exam. Blood and urine tests will help confirm the diagnosis. The diagnostic evaluation should also identify the cause of dehydration. TREATMENT  Treatment of mild or moderate dehydration can often be done at home by increasing the amount of fluids that you drink. It is best to drink small amounts of fluid more often. Drinking too much at one time can make vomiting worse. Refer to the home care instructions below. Severe dehydration needs to be treated at the hospital where you will probably be given intravenous (IV) fluids that contain water and electrolytes. HOME CARE INSTRUCTIONS   Ask your caregiver about specific rehydration instructions.  Drink enough fluids to keep your urine clear or pale yellow.  Drink small amounts frequently if you have nausea and vomiting.  Eat as you normally do.  Avoid:  Foods or drinks high in sugar.  Carbonated  drinks.  Juice.  Extremely hot or cold fluids.  Drinks with caffeine.  Fatty, greasy foods.  Alcohol.  Tobacco.  Overeating.  Gelatin desserts.  Wash your hands well to avoid spreading bacteria and viruses.  Only take over-the-counter or prescription medicines for pain, discomfort, or fever as directed by your caregiver.  Ask your caregiver if you should continue all prescribed and over-the-counter medicines.  Keep all follow-up appointments with your caregiver. SEEK MEDICAL CARE IF:  You have abdominal pain and it increases or stays in one area (localizes).  You have a rash, stiff neck, or severe headache.  You are irritable, sleepy, or difficult to awaken.  You are weak, dizzy, or extremely thirsty. SEEK IMMEDIATE MEDICAL CARE IF:   You are unable to keep fluids down or you get worse despite treatment.  You have frequent episodes of vomiting or diarrhea.  You have blood or green matter (bile) in your vomit.  You have blood in your stool or your stool looks black and tarry.  You have not urinated in 6 to 8 hours, or you have only urinated a small amount of very dark urine.  You have a fever.  You faint. MAKE SURE YOU:   Understand these instructions.  Will watch your condition.  Will get help right away if you are not doing well or get worse. Document Released: 12/18/2004 Document Revised: 03/12/2011 Document Reviewed: 08/07/2010 ExitCare Patient Information 2015 [employer]. This information is not intended to replace advice given to you by your health care provider. Make sure you discuss any questions you have with your health care   provider.  

## 2014-05-05 ENCOUNTER — Telehealth: Payer: Self-pay | Admitting: Hematology and Oncology

## 2014-05-05 ENCOUNTER — Ambulatory Visit (HOSPITAL_BASED_OUTPATIENT_CLINIC_OR_DEPARTMENT_OTHER): Payer: Medicare Other

## 2014-05-05 ENCOUNTER — Ambulatory Visit
Admission: RE | Admit: 2014-05-05 | Discharge: 2014-05-05 | Disposition: A | Discharge status: Home / Self Care | Payer: Medicare Other | Source: Ambulatory Visit | Attending: Radiation Oncology | Admitting: Radiation Oncology

## 2014-05-05 ENCOUNTER — Ambulatory Visit: Payer: Self-pay

## 2014-05-05 VITALS — BP 119/57 | HR 74 | Temp 98.9°F | Resp 20

## 2014-05-05 DIAGNOSIS — Z5111 Encounter for antineoplastic chemotherapy: Secondary | ICD-10-CM | POA: Diagnosis not present

## 2014-05-05 DIAGNOSIS — K1233 Oral mucositis (ulcerative) due to radiation: Secondary | ICD-10-CM | POA: Diagnosis not present

## 2014-05-05 DIAGNOSIS — C01 Malignant neoplasm of base of tongue: Secondary | ICD-10-CM

## 2014-05-05 DIAGNOSIS — L598 Other specified disorders of the skin and subcutaneous tissue related to radiation: Secondary | ICD-10-CM | POA: Diagnosis not present

## 2014-05-05 DIAGNOSIS — Z510 Encounter for antineoplastic radiation therapy: Secondary | ICD-10-CM | POA: Diagnosis not present

## 2014-05-05 DIAGNOSIS — B370 Candidal stomatitis: Secondary | ICD-10-CM | POA: Diagnosis not present

## 2014-05-05 DIAGNOSIS — F411 Generalized anxiety disorder: Secondary | ICD-10-CM | POA: Diagnosis not present

## 2014-05-05 MED ORDER — PALONOSETRON HCL INJECTION 0.25 MG/5ML
0.2500 mg | Freq: Once | INTRAVENOUS | Status: AC
  Administered 2014-05-05: 0.25 mg via INTRAVENOUS

## 2014-05-05 MED ORDER — CISPLATIN CHEMO INJECTION 100MG/100ML
80.0000 mg/m2 | Freq: Once | INTRAVENOUS | Status: AC
  Administered 2014-05-05: 166 mg via INTRAVENOUS
  Filled 2014-05-05: qty 166

## 2014-05-05 MED ORDER — SODIUM CHLORIDE 0.9 % IJ SOLN
10.0000 mL | INTRAMUSCULAR | Status: DC | PRN
  Administered 2014-05-05: 10 mL
  Filled 2014-05-05: qty 10

## 2014-05-05 MED ORDER — HEPARIN SOD (PORK) LOCK FLUSH 100 UNIT/ML IV SOLN
500.0000 [IU] | Freq: Once | INTRAVENOUS | Status: AC | PRN
  Administered 2014-05-05: 500 [IU]
  Filled 2014-05-05: qty 5

## 2014-05-05 MED ORDER — PALONOSETRON HCL INJECTION 0.25 MG/5ML
INTRAVENOUS | Status: AC
  Filled 2014-05-05: qty 5

## 2014-05-05 MED ORDER — POTASSIUM CHLORIDE 2 MEQ/ML IV SOLN
Freq: Once | INTRAVENOUS | Status: AC
  Administered 2014-05-05: 09:00:00 via INTRAVENOUS
  Filled 2014-05-05: qty 10

## 2014-05-05 MED ORDER — SODIUM CHLORIDE 0.9 % IV SOLN
INTRAVENOUS | Status: DC
  Administered 2014-05-05: 09:00:00 via INTRAVENOUS

## 2014-05-05 MED ORDER — FOSAPREPITANT DIMEGLUMINE INJECTION 150 MG
Freq: Once | INTRAVENOUS | Status: AC
  Administered 2014-05-05: 11:00:00 via INTRAVENOUS
  Filled 2014-05-05: qty 5

## 2014-05-05 NOTE — Progress Notes (Signed)
1045: Urine output = 250 cc

## 2014-05-05 NOTE — Progress Notes (Signed)
Total urine output pre-Cisplatin = 550cc Total urine output post-Cisplatin = 600cc

## 2014-05-05 NOTE — Patient Instructions (Signed)
Cancer Center Discharge Instructions for Patients Receiving Chemotherapy  Today you received the following chemotherapy agents Cisplatin  To help prevent nausea and vomiting after your treatment, we encourage you to take your nausea medication   If you develop nausea and vomiting that is not controlled by your nausea medication, call the clinic.   BELOW ARE SYMPTOMS THAT SHOULD BE REPORTED IMMEDIATELY:  *FEVER GREATER THAN 100.5 F  *CHILLS WITH OR WITHOUT FEVER  NAUSEA AND VOMITING THAT IS NOT CONTROLLED WITH YOUR NAUSEA MEDICATION  *UNUSUAL SHORTNESS OF BREATH  *UNUSUAL BRUISING OR BLEEDING  TENDERNESS IN MOUTH AND THROAT WITH OR WITHOUT PRESENCE OF ULCERS  *URINARY PROBLEMS  *BOWEL PROBLEMS  UNUSUAL RASH Items with * indicate a potential emergency and should be followed up as soon as possible.  Feel free to call the clinic you have any questions or concerns. The clinic phone number is [phone].  Please show the CHEMO ALERT CARD at check-in to the Emergency Department and triage nurse.   

## 2014-05-05 NOTE — Telephone Encounter (Signed)
Took schedule to pt in chemo room with updated schedule... KJ

## 2014-05-06 ENCOUNTER — Ambulatory Visit: Payer: Medicare Other | Admitting: Nutrition

## 2014-05-06 ENCOUNTER — Encounter: Payer: Self-pay | Admitting: *Deleted

## 2014-05-06 ENCOUNTER — Ambulatory Visit
Admission: RE | Admit: 2014-05-06 | Discharge: 2014-05-06 | Disposition: A | Discharge status: Home / Self Care | Payer: Medicare Other | Source: Ambulatory Visit | Attending: Radiation Oncology | Admitting: Radiation Oncology

## 2014-05-06 ENCOUNTER — Ambulatory Visit (HOSPITAL_BASED_OUTPATIENT_CLINIC_OR_DEPARTMENT_OTHER): Payer: Medicare Other

## 2014-05-06 VITALS — BP 142/76 | HR 67 | Temp 97.8°F | Resp 18

## 2014-05-06 DIAGNOSIS — C01 Malignant neoplasm of base of tongue: Secondary | ICD-10-CM

## 2014-05-06 DIAGNOSIS — K1233 Oral mucositis (ulcerative) due to radiation: Secondary | ICD-10-CM | POA: Diagnosis not present

## 2014-05-06 DIAGNOSIS — F411 Generalized anxiety disorder: Secondary | ICD-10-CM | POA: Diagnosis not present

## 2014-05-06 DIAGNOSIS — E860 Dehydration: Secondary | ICD-10-CM

## 2014-05-06 DIAGNOSIS — L598 Other specified disorders of the skin and subcutaneous tissue related to radiation: Secondary | ICD-10-CM | POA: Diagnosis not present

## 2014-05-06 DIAGNOSIS — B370 Candidal stomatitis: Secondary | ICD-10-CM | POA: Diagnosis not present

## 2014-05-06 DIAGNOSIS — Z510 Encounter for antineoplastic radiation therapy: Secondary | ICD-10-CM | POA: Diagnosis not present

## 2014-05-06 MED ORDER — HEPARIN SOD (PORK) LOCK FLUSH 100 UNIT/ML IV SOLN
500.0000 [IU] | Freq: Once | INTRAVENOUS | Status: AC | PRN
  Administered 2014-05-06: 500 [IU]
  Filled 2014-05-06: qty 5

## 2014-05-06 MED ORDER — SODIUM CHLORIDE 0.9 % IJ SOLN
10.0000 mL | INTRAMUSCULAR | Status: DC | PRN
  Administered 2014-05-06: 10 mL
  Filled 2014-05-06: qty 10

## 2014-05-06 MED ORDER — SODIUM CHLORIDE 0.9 % IV SOLN
1000.0000 mL | Freq: Once | INTRAVENOUS | Status: AC
  Administered 2014-05-06: 15:00:00 via INTRAVENOUS

## 2014-05-06 MED ORDER — SODIUM CHLORIDE 0.9 % IV SOLN
10.0000 mg | Freq: Once | INTRAVENOUS | Status: AC
  Administered 2014-05-06: 10 mg via INTRAVENOUS
  Filled 2014-05-06: qty 1

## 2014-05-06 NOTE — Patient Instructions (Signed)
Dehydration, Adult Dehydration is when you lose more fluids from the body than you take in. Vital organs like the kidneys, brain, and heart cannot function without a proper amount of fluids and salt. Any loss of fluids from the body can cause dehydration.  CAUSES   Vomiting.  Diarrhea.  Excessive sweating.  Excessive urine output.  Fever. SYMPTOMS  Mild dehydration  Thirst.  Dry lips.  Slightly dry mouth. Moderate dehydration  Very dry mouth.  Sunken eyes.  Skin does not bounce back quickly when lightly pinched and released.  Dark urine and decreased urine production.  Decreased tear production.  Headache. Severe dehydration  Very dry mouth.  Extreme thirst.  Rapid, weak pulse (more than 100 beats per minute at rest).  Cold hands and feet.  Not able to sweat in spite of heat and temperature.  Rapid breathing.  Blue lips.  Confusion and lethargy.  Difficulty being awakened.  Minimal urine production.  No tears. DIAGNOSIS  Your caregiver will diagnose dehydration based on your symptoms and your exam. Blood and urine tests will help confirm the diagnosis. The diagnostic evaluation should also identify the cause of dehydration. TREATMENT  Treatment of mild or moderate dehydration can often be done at home by increasing the amount of fluids that you drink. It is best to drink small amounts of fluid more often. Drinking too much at one time can make vomiting worse. Refer to the home care instructions below. Severe dehydration needs to be treated at the hospital where you will probably be given intravenous (IV) fluids that contain water and electrolytes. HOME CARE INSTRUCTIONS   Ask your caregiver about specific rehydration instructions.  Drink enough fluids to keep your urine clear or pale yellow.  Drink small amounts frequently if you have nausea and vomiting.  Eat as you normally do.  Avoid:  Foods or drinks high in sugar.  Carbonated  drinks.  Juice.  Extremely hot or cold fluids.  Drinks with caffeine.  Fatty, greasy foods.  Alcohol.  Tobacco.  Overeating.  Gelatin desserts.  Wash your hands well to avoid spreading bacteria and viruses.  Only take over-the-counter or prescription medicines for pain, discomfort, or fever as directed by your caregiver.  Ask your caregiver if you should continue all prescribed and over-the-counter medicines.  Keep all follow-up appointments with your caregiver. SEEK MEDICAL CARE IF:  You have abdominal pain and it increases or stays in one area (localizes).  You have a rash, stiff neck, or severe headache.  You are irritable, sleepy, or difficult to awaken.  You are weak, dizzy, or extremely thirsty. SEEK IMMEDIATE MEDICAL CARE IF:   You are unable to keep fluids down or you get worse despite treatment.  You have frequent episodes of vomiting or diarrhea.  You have blood or green matter (bile) in your vomit.  You have blood in your stool or your stool looks black and tarry.  You have not urinated in 6 to 8 hours, or you have only urinated a small amount of very dark urine.  You have a fever.  You faint. MAKE SURE YOU:   Understand these instructions.  Will watch your condition.  Will get help right away if you are not doing well or get worse. Document Released: 12/18/2004 Document Revised: 03/12/2011 Document Reviewed: 08/07/2010 ExitCare Patient Information 2015 [employer]. This information is not intended to replace advice given to you by your health care provider. Make sure you discuss any questions you have with your health care   provider.  

## 2014-05-06 NOTE — Progress Notes (Signed)
Nutrition follow-up completed with patient and wife during IV fluids in the chemotherapy area.  Patient is being treated for tongue cancer. Patient is not consuming oral foods by mouth.  He will sip on water throughout the day if encouraged. Patient is tolerating Osmolite 1.5, approximately 6 cans daily, via PEG to tube. Patient is flushing feeding tube with 60 cc of free water before and after feedings. Reports constipation has improved using MiraLAX. Weight has increased and was documented as 188.4 pounds on May 2 increased from 184 pounds April 25.  Nutrition diagnosis: Inadequate oral intake continues.  Estimated nutrition needs: 2300-2500 cal, 115-125 grams protein, 2.5 L fluid.  6 cans Osmolite 1.5 provides 2130 cal, 89.4 g protein, 1086 mL free water.  Intervention: Patient educated to continue to drink water throughout the day for a minimum of 720 cc by mouth. Educated patient on giving bolus feeding using syringe and gravity method. Provided option for patient to utilize 2 cans Osmolite 1.5 3 times a day if tolerated. Questions were answered and teach back method used.  Monitoring, evaluation, goals: Patient is tolerating tube feeding to prevent further weight loss. If patient has weight loss will increase tube feedings to promote weight maintenance  Next visit: Wednesday, May 11, during infusion.  **Disclaimer: This note was dictated with voice recognition software. Similar sounding words can inadvertently be transcribed and this note may contain transcription errors which may not have been corrected upon publication of note.**

## 2014-05-07 ENCOUNTER — Ambulatory Visit
Admission: RE | Admit: 2014-05-07 | Discharge: 2014-05-07 | Disposition: A | Discharge status: Home / Self Care | Payer: Medicare Other | Source: Ambulatory Visit | Attending: Radiation Oncology | Admitting: Radiation Oncology

## 2014-05-07 ENCOUNTER — Ambulatory Visit (HOSPITAL_BASED_OUTPATIENT_CLINIC_OR_DEPARTMENT_OTHER): Payer: Medicare Other

## 2014-05-07 ENCOUNTER — Other Ambulatory Visit: Payer: Self-pay | Admitting: Medical Oncology

## 2014-05-07 VITALS — BP 147/86 | HR 66 | Temp 96.7°F | Resp 17

## 2014-05-07 DIAGNOSIS — B370 Candidal stomatitis: Secondary | ICD-10-CM | POA: Diagnosis not present

## 2014-05-07 DIAGNOSIS — F411 Generalized anxiety disorder: Secondary | ICD-10-CM | POA: Diagnosis not present

## 2014-05-07 DIAGNOSIS — C01 Malignant neoplasm of base of tongue: Secondary | ICD-10-CM

## 2014-05-07 DIAGNOSIS — E860 Dehydration: Secondary | ICD-10-CM

## 2014-05-07 DIAGNOSIS — Z510 Encounter for antineoplastic radiation therapy: Secondary | ICD-10-CM | POA: Diagnosis not present

## 2014-05-07 DIAGNOSIS — K1233 Oral mucositis (ulcerative) due to radiation: Secondary | ICD-10-CM | POA: Diagnosis not present

## 2014-05-07 DIAGNOSIS — L598 Other specified disorders of the skin and subcutaneous tissue related to radiation: Secondary | ICD-10-CM | POA: Diagnosis not present

## 2014-05-07 MED ORDER — PROMETHAZINE HCL 25 MG/ML IJ SOLN
25.0000 mg | Freq: Once | INTRAMUSCULAR | Status: DC
  Filled 2014-05-07: qty 1

## 2014-05-07 MED ORDER — HEPARIN SOD (PORK) LOCK FLUSH 100 UNIT/ML IV SOLN
500.0000 [IU] | Freq: Once | INTRAVENOUS | Status: AC | PRN
  Administered 2014-05-07: 500 [IU]
  Filled 2014-05-07: qty 5

## 2014-05-07 MED ORDER — SODIUM CHLORIDE 0.9 % IV SOLN
Freq: Once | INTRAVENOUS | Status: AC
  Administered 2014-05-07: 15:00:00 via INTRAVENOUS

## 2014-05-07 MED ORDER — SODIUM CHLORIDE 0.9 % IJ SOLN
10.0000 mL | INTRAMUSCULAR | Status: DC | PRN
  Administered 2014-05-07: 10 mL
  Filled 2014-05-07: qty 10

## 2014-05-07 NOTE — Patient Instructions (Signed)
Dehydration, Adult Dehydration is when you lose more fluids from the body than you take in. Vital organs like the kidneys, brain, and heart cannot function without a proper amount of fluids and salt. Any loss of fluids from the body can cause dehydration.  CAUSES   Vomiting.  Diarrhea.  Excessive sweating.  Excessive urine output.  Fever. SYMPTOMS  Mild dehydration  Thirst.  Dry lips.  Slightly dry mouth. Moderate dehydration  Very dry mouth.  Sunken eyes.  Skin does not bounce back quickly when lightly pinched and released.  Dark urine and decreased urine production.  Decreased tear production.  Headache. Severe dehydration  Very dry mouth.  Extreme thirst.  Rapid, weak pulse (more than 100 beats per minute at rest).  Cold hands and feet.  Not able to sweat in spite of heat and temperature.  Rapid breathing.  Blue lips.  Confusion and lethargy.  Difficulty being awakened.  Minimal urine production.  No tears. DIAGNOSIS  Your caregiver will diagnose dehydration based on your symptoms and your exam. Blood and urine tests will help confirm the diagnosis. The diagnostic evaluation should also identify the cause of dehydration. TREATMENT  Treatment of mild or moderate dehydration can often be done at home by increasing the amount of fluids that you drink. It is best to drink small amounts of fluid more often. Drinking too much at one time can make vomiting worse. Refer to the home care instructions below. Severe dehydration needs to be treated at the hospital where you will probably be given intravenous (IV) fluids that contain water and electrolytes. HOME CARE INSTRUCTIONS   Ask your caregiver about specific rehydration instructions.  Drink enough fluids to keep your urine clear or pale yellow.  Drink small amounts frequently if you have nausea and vomiting.  Eat as you normally do.  Avoid:  Foods or drinks high in sugar.  Carbonated  drinks.  Juice.  Extremely hot or cold fluids.  Drinks with caffeine.  Fatty, greasy foods.  Alcohol.  Tobacco.  Overeating.  Gelatin desserts.  Wash your hands well to avoid spreading bacteria and viruses.  Only take over-the-counter or prescription medicines for pain, discomfort, or fever as directed by your caregiver.  Ask your caregiver if you should continue all prescribed and over-the-counter medicines.  Keep all follow-up appointments with your caregiver. SEEK MEDICAL CARE IF:  You have abdominal pain and it increases or stays in one area (localizes).  You have a rash, stiff neck, or severe headache.  You are irritable, sleepy, or difficult to awaken.  You are weak, dizzy, or extremely thirsty. SEEK IMMEDIATE MEDICAL CARE IF:   You are unable to keep fluids down or you get worse despite treatment.  You have frequent episodes of vomiting or diarrhea.  You have blood or green matter (bile) in your vomit.  You have blood in your stool or your stool looks black and tarry.  You have not urinated in 6 to 8 hours, or you have only urinated a small amount of very dark urine.  You have a fever.  You faint. MAKE SURE YOU:   Understand these instructions.  Will watch your condition.  Will get help right away if you are not doing well or get worse. Document Released: 12/18/2004 Document Revised: 03/12/2011 Document Reviewed: 08/07/2010 ExitCare Patient Information 2015 [employer]. This information is not intended to replace advice given to you by your health care provider. Make sure you discuss any questions you have with your health care   provider.  

## 2014-05-07 NOTE — Progress Notes (Signed)
To provide support and encouragement, care continuity and to assess for needs, met with patient in Infusion while he was receiving IVF.  His wife was with him. 1. He reported:  Continuing success with tmts.  5 cans nutritional supplement daily.  Controlled pain. 2. He did not express any needs or concerns at this time, I encouraged him to contact me if that changes, he verbalized understanding.  Gayleen Orem, RN, BSN, [place] [facility] [place] [phone]

## 2014-05-08 ENCOUNTER — Ambulatory Visit (HOSPITAL_BASED_OUTPATIENT_CLINIC_OR_DEPARTMENT_OTHER): Payer: Medicare Other

## 2014-05-08 VITALS — BP 136/72 | HR 72 | Temp 98.2°F | Resp 18

## 2014-05-08 DIAGNOSIS — C01 Malignant neoplasm of base of tongue: Secondary | ICD-10-CM

## 2014-05-08 DIAGNOSIS — E860 Dehydration: Secondary | ICD-10-CM

## 2014-05-08 MED ORDER — SODIUM CHLORIDE 0.9 % IV SOLN: 10.0000 mg | Freq: Once | INTRAVENOUS | Status: DC

## 2014-05-08 MED ORDER — HEPARIN SOD (PORK) LOCK FLUSH 100 UNIT/ML IV SOLN
500.0000 [IU] | Freq: Once | INTRAVENOUS | Status: AC | PRN
  Administered 2014-05-08: 500 [IU]
  Filled 2014-05-08: qty 5

## 2014-05-08 MED ORDER — ALTEPLASE 2 MG IJ SOLR
2.0000 mg | Freq: Once | INTRAMUSCULAR | Status: DC | PRN
  Filled 2014-05-08: qty 2

## 2014-05-08 MED ORDER — SODIUM CHLORIDE 0.9 % IJ SOLN
10.0000 mL | INTRAMUSCULAR | Status: DC | PRN
  Administered 2014-05-08: 10 mL
  Filled 2014-05-08: qty 10

## 2014-05-08 MED ORDER — HEPARIN SOD (PORK) LOCK FLUSH 100 UNIT/ML IV SOLN
250.0000 [IU] | Freq: Once | INTRAVENOUS | Status: DC | PRN
  Filled 2014-05-08: qty 5

## 2014-05-08 MED ORDER — SODIUM CHLORIDE 0.9 % IV SOLN
1000.0000 mL | Freq: Once | INTRAVENOUS | Status: AC
  Administered 2014-05-08: 08:00:00 via INTRAVENOUS

## 2014-05-08 NOTE — Patient Instructions (Signed)
Dehydration, Adult Dehydration is when you lose more fluids from the body than you take in. Vital organs like the kidneys, brain, and heart cannot function without a proper amount of fluids and salt. Any loss of fluids from the body can cause dehydration.  CAUSES   Vomiting.  Diarrhea.  Excessive sweating.  Excessive urine output.  Fever. SYMPTOMS  Mild dehydration  Thirst.  Dry lips.  Slightly dry mouth. Moderate dehydration  Very dry mouth.  Sunken eyes.  Skin does not bounce back quickly when lightly pinched and released.  Dark urine and decreased urine production.  Decreased tear production.  Headache. Severe dehydration  Very dry mouth.  Extreme thirst.  Rapid, weak pulse (more than 100 beats per minute at rest).  Cold hands and feet.  Not able to sweat in spite of heat and temperature.  Rapid breathing.  Blue lips.  Confusion and lethargy.  Difficulty being awakened.  Minimal urine production.  No tears. DIAGNOSIS  Your caregiver will diagnose dehydration based on your symptoms and your exam. Blood and urine tests will help confirm the diagnosis. The diagnostic evaluation should also identify the cause of dehydration. TREATMENT  Treatment of mild or moderate dehydration can often be done at home by increasing the amount of fluids that you drink. It is best to drink small amounts of fluid more often. Drinking too much at one time can make vomiting worse. Refer to the home care instructions below. Severe dehydration needs to be treated at the hospital where you will probably be given intravenous (IV) fluids that contain water and electrolytes. HOME CARE INSTRUCTIONS   Ask your caregiver about specific rehydration instructions.  Drink enough fluids to keep your urine clear or pale yellow.  Drink small amounts frequently if you have nausea and vomiting.  Eat as you normally do.  Avoid:  Foods or drinks high in sugar.  Carbonated  drinks.  Juice.  Extremely hot or cold fluids.  Drinks with caffeine.  Fatty, greasy foods.  Alcohol.  Tobacco.  Overeating.  Gelatin desserts.  Wash your hands well to avoid spreading bacteria and viruses.  Only take over-the-counter or prescription medicines for pain, discomfort, or fever as directed by your caregiver.  Ask your caregiver if you should continue all prescribed and over-the-counter medicines.  Keep all follow-up appointments with your caregiver. SEEK MEDICAL CARE IF:  You have abdominal pain and it increases or stays in one area (localizes).  You have a rash, stiff neck, or severe headache.  You are irritable, sleepy, or difficult to awaken.  You are weak, dizzy, or extremely thirsty. SEEK IMMEDIATE MEDICAL CARE IF:   You are unable to keep fluids down or you get worse despite treatment.  You have frequent episodes of vomiting or diarrhea.  You have blood or green matter (bile) in your vomit.  You have blood in your stool or your stool looks black and tarry.  You have not urinated in 6 to 8 hours, or you have only urinated a small amount of very dark urine.  You have a fever.  You faint. MAKE SURE YOU:   Understand these instructions.  Will watch your condition.  Will get help right away if you are not doing well or get worse. Document Released: 12/18/2004 Document Revised: 03/12/2011 Document Reviewed: 08/07/2010 ExitCare Patient Information 2015 [employer]. This information is not intended to replace advice given to you by your health care provider. Make sure you discuss any questions you have with your health care   provider.  

## 2014-05-10 ENCOUNTER — Ambulatory Visit
Admission: RE | Admit: 2014-05-10 | Discharge: 2014-05-10 | Disposition: A | Discharge status: Home / Self Care | Payer: Medicare Other | Source: Ambulatory Visit | Attending: Radiation Oncology | Admitting: Radiation Oncology

## 2014-05-10 ENCOUNTER — Ambulatory Visit (HOSPITAL_BASED_OUTPATIENT_CLINIC_OR_DEPARTMENT_OTHER): Payer: Medicare Other | Admitting: Hematology and Oncology

## 2014-05-10 ENCOUNTER — Encounter: Payer: Self-pay | Admitting: Hematology and Oncology

## 2014-05-10 ENCOUNTER — Ambulatory Visit (HOSPITAL_BASED_OUTPATIENT_CLINIC_OR_DEPARTMENT_OTHER): Payer: Medicare Other

## 2014-05-10 VITALS — BP 127/75 | HR 78 | Temp 98.1°F | Resp 20 | Wt 184.0 lb

## 2014-05-10 DIAGNOSIS — B370 Candidal stomatitis: Secondary | ICD-10-CM | POA: Diagnosis not present

## 2014-05-10 DIAGNOSIS — C01 Malignant neoplasm of base of tongue: Secondary | ICD-10-CM

## 2014-05-10 DIAGNOSIS — K1233 Oral mucositis (ulcerative) due to radiation: Secondary | ICD-10-CM | POA: Diagnosis not present

## 2014-05-10 DIAGNOSIS — Z510 Encounter for antineoplastic radiation therapy: Secondary | ICD-10-CM | POA: Diagnosis not present

## 2014-05-10 DIAGNOSIS — R634 Abnormal weight loss: Secondary | ICD-10-CM | POA: Diagnosis not present

## 2014-05-10 DIAGNOSIS — K1231 Oral mucositis (ulcerative) due to antineoplastic therapy: Secondary | ICD-10-CM

## 2014-05-10 DIAGNOSIS — G4700 Insomnia, unspecified: Secondary | ICD-10-CM | POA: Diagnosis not present

## 2014-05-10 DIAGNOSIS — F411 Generalized anxiety disorder: Secondary | ICD-10-CM | POA: Diagnosis not present

## 2014-05-10 DIAGNOSIS — E860 Dehydration: Secondary | ICD-10-CM

## 2014-05-10 DIAGNOSIS — L598 Other specified disorders of the skin and subcutaneous tissue related to radiation: Secondary | ICD-10-CM | POA: Diagnosis not present

## 2014-05-10 MED ORDER — HEPARIN SOD (PORK) LOCK FLUSH 100 UNIT/ML IV SOLN
250.0000 [IU] | Freq: Once | INTRAVENOUS | Status: DC | PRN
  Filled 2014-05-10: qty 5

## 2014-05-10 MED ORDER — PROMETHAZINE HCL 25 MG/ML IJ SOLN
25.0000 mg | Freq: Once | INTRAMUSCULAR | Status: DC
  Filled 2014-05-10: qty 1

## 2014-05-10 MED ORDER — ALTEPLASE 2 MG IJ SOLR
2.0000 mg | Freq: Once | INTRAMUSCULAR | Status: DC | PRN
  Filled 2014-05-10: qty 2

## 2014-05-10 MED ORDER — HYDROMORPHONE HCL 1 MG/ML IJ SOLN
1.0000 mg | INTRAMUSCULAR | Status: DC | PRN
  Filled 2014-05-10: qty 1

## 2014-05-10 MED ORDER — SODIUM CHLORIDE 0.9 % IJ SOLN
10.0000 mL | INTRAMUSCULAR | Status: DC | PRN
  Administered 2014-05-10: 10 mL
  Filled 2014-05-10: qty 10

## 2014-05-10 MED ORDER — SODIUM CHLORIDE 0.9 % IV SOLN
1000.0000 mL | Freq: Once | INTRAVENOUS | Status: AC
  Administered 2014-05-10: 1000 mL via INTRAVENOUS

## 2014-05-10 MED ORDER — HYDROMORPHONE HCL 4 MG/ML IJ SOLN
INTRAMUSCULAR | Status: AC
  Filled 2014-05-10: qty 1

## 2014-05-10 MED ORDER — HEPARIN SOD (PORK) LOCK FLUSH 100 UNIT/ML IV SOLN
500.0000 [IU] | Freq: Once | INTRAVENOUS | Status: AC | PRN
  Administered 2014-05-10: 500 [IU]
  Filled 2014-05-10: qty 5

## 2014-05-10 NOTE — Assessment & Plan Note (Signed)
He has significant mucositis. He has not used his pain medication recently and I encouraged him to use it

## 2014-05-10 NOTE — Patient Instructions (Signed)
Dehydration, Adult Dehydration is when you lose more fluids from the body than you take in. Vital organs like the kidneys, brain, and heart cannot function without a proper amount of fluids and salt. Any loss of fluids from the body can cause dehydration.  CAUSES   Vomiting.  Diarrhea.  Excessive sweating.  Excessive urine output.  Fever. SYMPTOMS  Mild dehydration  Thirst.  Dry lips.  Slightly dry mouth. Moderate dehydration  Very dry mouth.  Sunken eyes.  Skin does not bounce back quickly when lightly pinched and released.  Dark urine and decreased urine production.  Decreased tear production.  Headache. Severe dehydration  Very dry mouth.  Extreme thirst.  Rapid, weak pulse (more than 100 beats per minute at rest).  Cold hands and feet.  Not able to sweat in spite of heat and temperature.  Rapid breathing.  Blue lips.  Confusion and lethargy.  Difficulty being awakened.  Minimal urine production.  No tears. DIAGNOSIS  Your caregiver will diagnose dehydration based on your symptoms and your exam. Blood and urine tests will help confirm the diagnosis. The diagnostic evaluation should also identify the cause of dehydration. TREATMENT  Treatment of mild or moderate dehydration can often be done at home by increasing the amount of fluids that you drink. It is best to drink small amounts of fluid more often. Drinking too much at one time can make vomiting worse. Refer to the home care instructions below. Severe dehydration needs to be treated at the hospital where you will probably be given intravenous (IV) fluids that contain water and electrolytes. HOME CARE INSTRUCTIONS   Ask your caregiver about specific rehydration instructions.  Drink enough fluids to keep your urine clear or pale yellow.  Drink small amounts frequently if you have nausea and vomiting.  Eat as you normally do.  Avoid:  Foods or drinks high in sugar.  Carbonated  drinks.  Juice.  Extremely hot or cold fluids.  Drinks with caffeine.  Fatty, greasy foods.  Alcohol.  Tobacco.  Overeating.  Gelatin desserts.  Wash your hands well to avoid spreading bacteria and viruses.  Only take over-the-counter or prescription medicines for pain, discomfort, or fever as directed by your caregiver.  Ask your caregiver if you should continue all prescribed and over-the-counter medicines.  Keep all follow-up appointments with your caregiver. SEEK MEDICAL CARE IF:  You have abdominal pain and it increases or stays in one area (localizes).  You have a rash, stiff neck, or severe headache.  You are irritable, sleepy, or difficult to awaken.  You are weak, dizzy, or extremely thirsty. SEEK IMMEDIATE MEDICAL CARE IF:   You are unable to keep fluids down or you get worse despite treatment.  You have frequent episodes of vomiting or diarrhea.  You have blood or green matter (bile) in your vomit.  You have blood in your stool or your stool looks black and tarry.  You have not urinated in 6 to 8 hours, or you have only urinated a small amount of very dark urine.  You have a fever.  You faint. MAKE SURE YOU:   Understand these instructions.  Will watch your condition.  Will get help right away if you are not doing well or get worse. Document Released: 12/18/2004 Document Revised: 03/12/2011 Document Reviewed: 08/07/2010 ExitCare Patient Information 2015 [employer]. This information is not intended to replace advice given to you by your health care provider. Make sure you discuss any questions you have with your health care   provider.  

## 2014-05-10 NOTE — Progress Notes (Signed)
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 He returns for further follow-up. He complained of profound fatigue. He does not sleep well at nighttime but then takes nap during daytime. He started taking fentanyl patch last week. He denies pain or nausea. He is able to tolerate 6 cans of nutritional supplement.  REVIEW OF SYSTEMS:   Constitutional: Denies fevers, chills or abnormal weight loss Eyes: Denies blurriness of vision Ears, nose, mouth, throat, and face: Denies mucositis or sore throat Respiratory: Denies cough, dyspnea or wheezes Cardiovascular: Denies palpitation, chest discomfort or lower extremity swelling Gastrointestinal:  Denies nausea, heartburn or change in bowel habits Skin: Denies abnormal skin rashes Lymphatics: Denies new lymphadenopathy or easy bruising Neurological:Denies numbness, tingling or new weaknesses Behavioral/Psych: Mood is stable, no new  changes  All other systems were reviewed with the patient and are negative.  I have reviewed the past medical history, past surgical history, social history and family history with the patient and they are unchanged from previous note.  ALLERGIES:  has No Known Allergies.  MEDICATIONS:  Current Outpatient Prescriptions  Medication Sig Dispense Refill  . amphetamine-dextroamphetamine (ADDERALL) 20 MG tablet Take 20 mg by mouth 2 (two) times daily.    Marland Kitchen aspirin 81 MG tablet Take 81 mg by mouth daily.    Marland Kitchen atorvastatin (LIPITOR) 10 MG tablet Take 10 mg by mouth daily.    . fentaNYL (DURAGESIC - DOSED MCG/HR) 25 MCG/HR patch Place 1 patch (25 mcg total) onto the skin every 3 (three) days. (Patient not taking: Reported on 04/26/2014) 5 patch 0  . fluconazole (DIFLUCAN) 100 MG tablet Take 2 tablets today, then 1 tablet daily x 20 more days for yeast in mouth. 22 tablet 0  . levothyroxine (SYNTHROID, LEVOTHROID) 75 MCG tablet Take 75 mcg by mouth daily before breakfast.     . lidocaine (XYLOCAINE) 2 % solution Patient: Mix 1part 2% viscous lidocaine, 1part H20. Swish and/or swallow 41mL of this mixture, 51min before meals and at bedtime, up to QID 100 mL 5  . lidocaine-prilocaine (EMLA) cream Apply to affected area once 30 g 3  . LORazepam (ATIVAN) 0.5 MG tablet Take 1 tablet approximately 30 min prior to radiotherapy for anxiety/claustrophobia 40 tablet 0  . Morphine Sulfate (MORPHINE CONCENTRATE) 10 MG/0.5ML SOLN concentrated solution Take 0.5 mLs (10 mg total) by mouth every 2 (two) hours as needed. (Patient not taking: Reported on 05/03/2014) 240 mL 0  . Multiple Vitamin (MULTIVITAMIN WITH MINERALS) TABS tablet Take 1 tablet by  mouth daily.    . ondansetron (ZOFRAN) 8 MG tablet Take 1 tablet (8 mg total) by mouth every 8 (eight) hours as needed. 30 tablet 1  . prochlorperazine (COMPAZINE) 10 MG tablet Take 1 tablet (10 mg total) by mouth every 6 (six) hours as needed (Nausea or vomiting). (Patient  not taking: Reported on 04/26/2014) 30 tablet 1  . ranitidine (ZANTAC) 150 MG/10ML syrup Take 10 mLs (150 mg total) by mouth 2 (two) times daily. May flush into PEG tube. To prevent indigestion. 300 mL 3  . sodium fluoride (FLUORISHIELD) 1.1 % GEL dental gel Instill one drop of gel per tooth space of fluoride tray. Place over teeth for 5 minutes. Remove. Spit out excess. Repeat nightly. 120 mL prn  . sucralfate (CARAFATE) 1 G tablet Dissolve 1 tablet in 10 mL H20 and swallow QID PRN sore throat. 40 tablet 5  . Wound Dressings (RADIAGEL) GEL Apply topically 2 (two) times daily.     No current facility-administered medications for this visit.   Facility-Administered Medications Ordered in Other Visits  Medication Dose Route Frequency Provider Last Rate Last Dose  . alteplase (CATHFLO ACTIVASE) injection 2 mg  2 mg Intracatheter Once PRN Heath Lark, MD      . heparin lock flush 100 unit/mL  500 Units Intracatheter Once PRN Heath Lark, MD      . heparin lock flush 100 unit/mL  250 Units Intracatheter Once PRN Heath Lark, MD      . HYDROmorphone (DILAUDID) injection 1 mg  1 mg Intravenous Q2H PRN Heath Lark, MD      . promethazine (PHENERGAN) injection 25 mg  25 mg Intravenous Once Heath Lark, MD      . sodium chloride 0.9 % injection 10 mL  10 mL Intracatheter PRN Heath Lark, MD        PHYSICAL EXAMINATION: ECOG PERFORMANCE STATUS: 1 - Symptomatic but completely ambulatory GENERAL:alert, no distress and comfortable. Noticed some dermatitis around his neck from radiation SKIN: skin color, texture, turgor are normal, no rashes or significant lesions EYES: normal, Conjunctiva are pink and non-injected, sclera clear OROPHARYNX: Dry mucous membrane with mucositis. No thrush.  NECK: supple, thyroid normal size, non-tender, without nodularity LYMPH:  Persistent palpable lymphadenopathy in the neck, no change from previous exam LUNGS: clear to auscultation and percussion with normal breathing  effort HEART: regular rate & rhythm and no murmurs and no lower extremity edema ABDOMEN:abdomen soft, non-tender and normal bowel sounds. Feeding tube site looks okay Musculoskeletal:no cyanosis of digits and no clubbing  NEURO: alert & oriented x 3 with fluent speech, no focal motor/sensory deficits  LABORATORY DATA:  I have reviewed the data as listed    Component Value Date/Time   NA 136 05/03/2014 1142   NA 139 04/02/2014 1230   K 4.4 05/03/2014 1142   K 4.2 04/02/2014 1230   CL 105 04/02/2014 1230   CO2 25 05/03/2014 1142   CO2 25 04/02/2014 1230   GLUCOSE 169* 05/03/2014 1142   GLUCOSE 87 04/02/2014 1230   BUN 19.5 05/03/2014 1142   BUN 21 04/02/2014 1230   CREATININE 0.9 05/03/2014 1142   CREATININE 0.85 04/02/2014 1230   CALCIUM 9.8 05/03/2014 1142   CALCIUM 9.6 04/02/2014 1230   PROT 7.1 05/03/2014 1142   PROT 7.0 12/30/2012 0545   ALBUMIN 3.0* 05/03/2014 1142   ALBUMIN 3.7 12/30/2012 0545   AST 37* 05/03/2014 1142   AST 27 12/30/2012 0545   ALT 49 05/03/2014 1142   ALT  31 12/30/2012 0545   ALKPHOS 97 05/03/2014 1142   ALKPHOS 86 12/30/2012 0545   BILITOT 0.53 05/03/2014 1142   BILITOT 0.7 12/30/2012 0545   GFRNONAA 85* 04/02/2014 1230   GFRAA >90 04/02/2014 1230    No results found for: SPEP, UPEP  Lab Results  Component Value Date   WBC 4.8 05/03/2014   NEUTROABS 3.7 05/03/2014   HGB 12.1* 05/03/2014   HCT 34.9* 05/03/2014   MCV 87.0 05/03/2014   PLT 354 05/03/2014      Chemistry      Component Value Date/Time   NA 136 05/03/2014 1142   NA 139 04/02/2014 1230   K 4.4 05/03/2014 1142   K 4.2 04/02/2014 1230   CL 105 04/02/2014 1230   CO2 25 05/03/2014 1142   CO2 25 04/02/2014 1230   BUN 19.5 05/03/2014 1142   BUN 21 04/02/2014 1230   CREATININE 0.9 05/03/2014 1142   CREATININE 0.85 04/02/2014 1230      Component Value Date/Time   CALCIUM 9.8 05/03/2014 1142   CALCIUM 9.6 04/02/2014 1230   ALKPHOS 97 05/03/2014 1142   ALKPHOS 86  12/30/2012 0545   AST 37* 05/03/2014 1142   AST 27 12/30/2012 0545   ALT 49 05/03/2014 1142   ALT 31 12/30/2012 0545   BILITOT 0.53 05/03/2014 1142   BILITOT 0.7 12/30/2012 0545      ASSESSMENT & PLAN:  Malignant neoplasm of base of tongue The patient is starting to experience significant side effects from treatment. He has side effects expected from treatment I will continue supportive care for now       Dehydration He has minimum oral fluid intake over the weekend. I'm concerned about dehydration. He will continue daily IV fluids until he completes all his treatment   Mucositis due to chemotherapy He has significant mucositis. He has not used his pain medication recently and I encouraged him to use it    Weight loss He has significant weight loss recently. I told the patient he needs to increase it to 6 cans minimum per day and I will get dietitian to continue to follow him closely on a weekly basis if possible    Insomnia He complained of significant insomnia but then sleep during daytime. I recommend the patient to discontinue daytime napping.    No orders of the defined types were placed in this encounter.   All questions were answered. The patient knows to call the clinic with any problems, questions or concerns. No barriers to learning was detected. I spent 25 minutes counseling the patient face to face. The total time spent in the appointment was 30 minutes and more than 50% was on counseling and review of test results     [facility], Tiffiny Worthy, MD 05/10/2014 4:13 PM

## 2014-05-10 NOTE — Assessment & Plan Note (Signed)
He complained of significant insomnia but then sleep during daytime. I recommend the patient to discontinue daytime napping.

## 2014-05-10 NOTE — Progress Notes (Signed)
Weekly Management Note:   ICD-9-CM ICD-10-CM   1. Malignant neoplasm of base of tongue 141.0 C01     Site: Base of tongue/bilateral neck Current Dose:  50 Gy Projected Dose: 70 Gy  Narrative: The patient is seen today for routine under treatment assessment. CBCT/MVCT images/port films were reviewed. The chart was reviewed.  Running late for med/onc appt.  Just started using narcotic for pain.  No swallowing much at all but is able to   Physical Examination: There were no vitals filed for this visit..  Weight:  .  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 Appreciate palpable left neck adenopathy.  Oropharynx- confluent mucositis /dry mucosa  Laboratory data: Lab Results  Component Value Date   WBC 4.8 05/03/2014   HGB 12.1* 05/03/2014   HCT 34.9* 05/03/2014   MCV 87.0 05/03/2014   PLT 354 05/03/2014     Impression: Tolerating radiation therapy well.    Plan: Continue radiation therapy as planned. Urged patient to swallow and follow instructions of Garald Balding.   Patient visit abbreviated to allow med/onc appt.  -----------------------------------  Eppie Gibson, MD

## 2014-05-10 NOTE — Assessment & Plan Note (Signed)
He has minimum oral fluid intake over the weekend. I'm concerned about dehydration. He will continue daily IV fluids until he completes all his treatment

## 2014-05-10 NOTE — Assessment & Plan Note (Signed)
The patient is starting to experience significant side effects from treatment. He has side effects expected from treatment I will continue supportive care for now

## 2014-05-10 NOTE — Assessment & Plan Note (Signed)
He has significant weight loss recently. I told the patient he needs to increase it to 6 cans minimum per day and I will get dietitian to continue to follow him closely on a weekly basis if possible

## 2014-05-11 ENCOUNTER — Ambulatory Visit (HOSPITAL_BASED_OUTPATIENT_CLINIC_OR_DEPARTMENT_OTHER): Payer: Medicare Other

## 2014-05-11 ENCOUNTER — Ambulatory Visit
Admission: RE | Admit: 2014-05-11 | Discharge: 2014-05-11 | Disposition: A | Discharge status: Home / Self Care | Payer: Medicare Other | Source: Ambulatory Visit | Attending: Radiation Oncology | Admitting: Radiation Oncology

## 2014-05-11 VITALS — BP 152/83 | HR 80

## 2014-05-11 DIAGNOSIS — Z510 Encounter for antineoplastic radiation therapy: Secondary | ICD-10-CM | POA: Diagnosis not present

## 2014-05-11 DIAGNOSIS — C01 Malignant neoplasm of base of tongue: Secondary | ICD-10-CM

## 2014-05-11 DIAGNOSIS — E860 Dehydration: Secondary | ICD-10-CM

## 2014-05-11 DIAGNOSIS — L598 Other specified disorders of the skin and subcutaneous tissue related to radiation: Secondary | ICD-10-CM | POA: Diagnosis not present

## 2014-05-11 DIAGNOSIS — B370 Candidal stomatitis: Secondary | ICD-10-CM | POA: Diagnosis not present

## 2014-05-11 DIAGNOSIS — F411 Generalized anxiety disorder: Secondary | ICD-10-CM | POA: Diagnosis not present

## 2014-05-11 DIAGNOSIS — K1233 Oral mucositis (ulcerative) due to radiation: Secondary | ICD-10-CM | POA: Diagnosis not present

## 2014-05-11 DIAGNOSIS — R634 Abnormal weight loss: Secondary | ICD-10-CM

## 2014-05-11 MED ORDER — SODIUM CHLORIDE 0.9 % IJ SOLN
10.0000 mL | INTRAMUSCULAR | Status: DC | PRN
  Administered 2014-05-11: 10 mL
  Filled 2014-05-11: qty 10

## 2014-05-11 MED ORDER — HEPARIN SOD (PORK) LOCK FLUSH 100 UNIT/ML IV SOLN
250.0000 [IU] | Freq: Once | INTRAVENOUS | Status: DC | PRN
Start: 2014-05-11 — End: 2014-05-11
  Filled 2014-05-11: qty 5

## 2014-05-11 MED ORDER — SODIUM CHLORIDE 0.9 % IV SOLN
Freq: Once | INTRAVENOUS | Status: AC
  Administered 2014-05-11: 15:00:00 via INTRAVENOUS

## 2014-05-11 MED ORDER — PROMETHAZINE HCL 25 MG/ML IJ SOLN
25.0000 mg | Freq: Once | INTRAMUSCULAR | Status: AC
  Administered 2014-05-11: 12.5 mg via INTRAVENOUS
  Filled 2014-05-11: qty 1

## 2014-05-11 MED ORDER — HEPARIN SOD (PORK) LOCK FLUSH 100 UNIT/ML IV SOLN
500.0000 [IU] | Freq: Once | INTRAVENOUS | Status: AC | PRN
  Administered 2014-05-11: 500 [IU]
  Filled 2014-05-11: qty 5

## 2014-05-11 NOTE — Patient Instructions (Signed)
Nausea and Vomiting Nausea means you feel sick to your stomach. Throwing up (vomiting) is a reflex where stomach contents come out of your mouth. HOME CARE   Take medicine as told by your doctor.  Do not force yourself to eat. However, you do need to drink fluids.  If you feel like eating, eat a normal diet as told by your doctor.  Eat rice, wheat, potatoes, bread, lean meats, yogurt, fruits, and vegetables.  Avoid high-fat foods.  Drink enough fluids to keep your pee (urine) clear or pale yellow.  Ask your doctor how to replace body fluid losses (rehydrate). Signs of body fluid loss (dehydration) include:  Feeling very thirsty.  Dry lips and mouth.  Feeling dizzy.  Dark pee.  Peeing less than normal.  Feeling confused.  Fast breathing or heart rate. GET HELP RIGHT AWAY IF:   You have blood in your throw up.  You have black or bloody poop (stool).  You have a bad headache or stiff neck.  You feel confused.  You have bad belly (abdominal) pain.  You have chest pain or trouble breathing.  You do not pee at least once every 8 hours.  You have cold, clammy skin.  You keep throwing up after 24 to 48 hours.  You have a fever. MAKE SURE YOU:   Understand these instructions.  Will watch your condition.  Will get help right away if you are not doing well or get worse. Document Released: 06/06/2007 Document Revised: 03/12/2011 Document Reviewed: 05/19/2010 [facility] Patient Information 2015 [place], [place]. This information is not intended to replace advice given to you by your health care provider. Make sure you discuss any questions you have with your health care provider. Dehydration, Adult Dehydration is when you lose more fluids from the body than you take in. Vital organs like the kidneys, brain, and heart cannot function without a proper amount of fluids and salt. Any loss of fluids from the body can cause dehydration.  CAUSES    Vomiting.  Diarrhea.  Excessive sweating.  Excessive urine output.  Fever. SYMPTOMS  Mild dehydration  Thirst.  Dry lips.  Slightly dry mouth. Moderate dehydration  Very dry mouth.  Sunken eyes.  Skin does not bounce back quickly when lightly pinched and released.  Dark urine and decreased urine production.  Decreased tear production.  Headache. Severe dehydration  Very dry mouth.  Extreme thirst.  Rapid, weak pulse (more than 100 beats per minute at rest).  Cold hands and feet.  Not able to sweat in spite of heat and temperature.  Rapid breathing.  Blue lips.  Confusion and lethargy.  Difficulty being awakened.  Minimal urine production.  No tears. DIAGNOSIS  Your caregiver will diagnose dehydration based on your symptoms and your exam. Blood and urine tests will help confirm the diagnosis. The diagnostic evaluation should also identify the cause of dehydration. TREATMENT  Treatment of mild or moderate dehydration can often be done at home by increasing the amount of fluids that you drink. It is best to drink small amounts of fluid more often. Drinking too much at one time can make vomiting worse. Refer to the home care instructions below. Severe dehydration needs to be treated at the hospital where you will probably be given intravenous (IV) fluids that contain water and electrolytes. HOME CARE INSTRUCTIONS   Ask your caregiver about specific rehydration instructions.  Drink enough fluids to keep your urine clear or pale yellow.  Drink small amounts frequently if  you have nausea and vomiting.  Eat as you normally do.  Avoid:  Foods or drinks high in sugar.  Carbonated drinks.  Juice.  Extremely hot or cold fluids.  Drinks with caffeine.  Fatty, greasy foods.  Alcohol.  Tobacco.  Overeating.  Gelatin desserts.  Wash your hands well to avoid spreading bacteria and viruses.  Only take over-the-counter or prescription  medicines for pain, discomfort, or fever as directed by your caregiver.  Ask your caregiver if you should continue all prescribed and over-the-counter medicines.  Keep all follow-up appointments with your caregiver. SEEK MEDICAL CARE IF:  You have abdominal pain and it increases or stays in one area (localizes).  You have a rash, stiff neck, or severe headache.  You are irritable, sleepy, or difficult to awaken.  You are weak, dizzy, or extremely thirsty. SEEK IMMEDIATE MEDICAL CARE IF:   You are unable to keep fluids down or you get worse despite treatment.  You have frequent episodes of vomiting or diarrhea.  You have blood or green matter (bile) in your vomit.  You have blood in your stool or your stool looks black and tarry.  You have not urinated in 6 to 8 hours, or you have only urinated a small amount of very dark urine.  You have a fever.  You faint. MAKE SURE YOU:   Understand these instructions.  Will watch your condition.  Will get help right away if you are not doing well or get worse. Document Released: 12/18/2004 Document Revised: 03/12/2011 Document Reviewed: 08/07/2010 [facility] Patient Information 2015 [place], [place]. This information is not intended to replace advice given to you by your health care provider. Make sure you discuss any questions you have with your health care provider.

## 2014-05-11 NOTE — Progress Notes (Signed)
Pt reports nausea and vomiting in XRT earlier today.  Phenergan IV given as ordered and pt stated it relieved his nausea.   Pt has note pinned to his shirt by his wife stating he was nauseated this morning and took zofran at home this morning.   A zofran pill was taped to his note and said it is due to take at 3:30 pm.   Pt took his zofran tablet from home at 3:45 pm.   Note given to pt to inform his wife.

## 2014-05-12 ENCOUNTER — Ambulatory Visit
Admission: RE | Admit: 2014-05-12 | Discharge: 2014-05-12 | Disposition: A | Discharge status: Home / Self Care | Payer: Medicare Other | Source: Ambulatory Visit | Attending: Radiation Oncology | Admitting: Radiation Oncology

## 2014-05-12 ENCOUNTER — Encounter: Payer: Self-pay | Admitting: *Deleted

## 2014-05-12 ENCOUNTER — Ambulatory Visit (HOSPITAL_BASED_OUTPATIENT_CLINIC_OR_DEPARTMENT_OTHER): Payer: Medicare Other

## 2014-05-12 ENCOUNTER — Ambulatory Visit: Payer: Medicare Other | Admitting: Nutrition

## 2014-05-12 VITALS — BP 132/74 | HR 90 | Temp 98.7°F | Resp 16

## 2014-05-12 DIAGNOSIS — R634 Abnormal weight loss: Secondary | ICD-10-CM

## 2014-05-12 DIAGNOSIS — E860 Dehydration: Secondary | ICD-10-CM | POA: Diagnosis not present

## 2014-05-12 DIAGNOSIS — F411 Generalized anxiety disorder: Secondary | ICD-10-CM | POA: Diagnosis not present

## 2014-05-12 DIAGNOSIS — B370 Candidal stomatitis: Secondary | ICD-10-CM | POA: Diagnosis not present

## 2014-05-12 DIAGNOSIS — K1233 Oral mucositis (ulcerative) due to radiation: Secondary | ICD-10-CM | POA: Diagnosis not present

## 2014-05-12 DIAGNOSIS — C01 Malignant neoplasm of base of tongue: Secondary | ICD-10-CM

## 2014-05-12 DIAGNOSIS — L598 Other specified disorders of the skin and subcutaneous tissue related to radiation: Secondary | ICD-10-CM | POA: Diagnosis not present

## 2014-05-12 DIAGNOSIS — Z510 Encounter for antineoplastic radiation therapy: Secondary | ICD-10-CM | POA: Diagnosis not present

## 2014-05-12 MED ORDER — PROMETHAZINE HCL 25 MG/ML IJ SOLN
25.0000 mg | Freq: Once | INTRAMUSCULAR | Status: AC
  Administered 2014-05-12: 25 mg via INTRAVENOUS
  Filled 2014-05-12: qty 1

## 2014-05-12 MED ORDER — HEPARIN SOD (PORK) LOCK FLUSH 100 UNIT/ML IV SOLN
500.0000 [IU] | Freq: Once | INTRAVENOUS | Status: AC | PRN
  Administered 2014-05-12: 500 [IU]
  Filled 2014-05-12: qty 5

## 2014-05-12 MED ORDER — SODIUM CHLORIDE 0.9 % IV SOLN
Freq: Once | INTRAVENOUS | Status: AC
  Administered 2014-05-12: 15:00:00 via INTRAVENOUS

## 2014-05-12 MED ORDER — SODIUM CHLORIDE 0.9 % IJ SOLN
10.0000 mL | INTRAMUSCULAR | Status: DC | PRN
  Administered 2014-05-12: 10 mL
  Filled 2014-05-12: qty 10

## 2014-05-12 NOTE — Progress Notes (Signed)
Nutrition follow-up completed with patient during IV fluids in the chemotherapy area.  Patient is being treated for tongue cancer. Patient is not consuming oral foods by mouth. He will sip on water when encouraged. Patient is tolerating Osmolite 1.5 - 2 cans 3 times a day via PEG feeding tube. Patient has been flushing feeding tube with 120 cc free water before and after bolus feedings. Weight has decreased slightly and was documented as 184 pounds May 9.  Nutrition diagnosis: Inadequate oral intake continues.  Intervention:  Patient educated to continue strategies for tolerating 6 cans of Osmolite 1.5 daily to minimize weight loss. Patient should continue free water flushes and oral intake of water by mouth. Teach back method used.  Monitoring, evaluation, goals: Patient is tolerating tube feedings.  Next visit: Thursday, May 19, during IV fluids.  **Disclaimer: This note was dictated with voice recognition software. Similar sounding words can inadvertently be transcribed and this note may contain transcription errors which may not have been corrected upon publication of note.**

## 2014-05-12 NOTE — Patient Instructions (Signed)
Dehydration, Adult Dehydration is when you lose more fluids from the body than you take in. Vital organs like the kidneys, brain, and heart cannot function without a proper amount of fluids and salt. Any loss of fluids from the body can cause dehydration.  CAUSES   Vomiting.  Diarrhea.  Excessive sweating.  Excessive urine output.  Fever. SYMPTOMS  Mild dehydration  Thirst.  Dry lips.  Slightly dry mouth. Moderate dehydration  Very dry mouth.  Sunken eyes.  Skin does not bounce back quickly when lightly pinched and released.  Dark urine and decreased urine production.  Decreased tear production.  Headache. Severe dehydration  Very dry mouth.  Extreme thirst.  Rapid, weak pulse (more than 100 beats per minute at rest).  Cold hands and feet.  Not able to sweat in spite of heat and temperature.  Rapid breathing.  Blue lips.  Confusion and lethargy.  Difficulty being awakened.  Minimal urine production.  No tears. DIAGNOSIS  Your caregiver will diagnose dehydration based on your symptoms and your exam. Blood and urine tests will help confirm the diagnosis. The diagnostic evaluation should also identify the cause of dehydration. TREATMENT  Treatment of mild or moderate dehydration can often be done at home by increasing the amount of fluids that you drink. It is best to drink small amounts of fluid more often. Drinking too much at one time can make vomiting worse. Refer to the home care instructions below. Severe dehydration needs to be treated at the hospital where you will probably be given intravenous (IV) fluids that contain water and electrolytes. HOME CARE INSTRUCTIONS   Ask your caregiver about specific rehydration instructions.  Drink enough fluids to keep your urine clear or pale yellow.  Drink small amounts frequently if you have nausea and vomiting.  Eat as you normally do.  Avoid:  Foods or drinks high in sugar.  Carbonated  drinks.  Juice.  Extremely hot or cold fluids.  Drinks with caffeine.  Fatty, greasy foods.  Alcohol.  Tobacco.  Overeating.  Gelatin desserts.  Wash your hands well to avoid spreading bacteria and viruses.  Only take over-the-counter or prescription medicines for pain, discomfort, or fever as directed by your caregiver.  Ask your caregiver if you should continue all prescribed and over-the-counter medicines.  Keep all follow-up appointments with your caregiver. SEEK MEDICAL CARE IF:  You have abdominal pain and it increases or stays in one area (localizes).  You have a rash, stiff neck, or severe headache.  You are irritable, sleepy, or difficult to awaken.  You are weak, dizzy, or extremely thirsty. SEEK IMMEDIATE MEDICAL CARE IF:   You are unable to keep fluids down or you get worse despite treatment.  You have frequent episodes of vomiting or diarrhea.  You have blood or green matter (bile) in your vomit.  You have blood in your stool or your stool looks black and tarry.  You have not urinated in 6 to 8 hours, or you have only urinated a small amount of very dark urine.  You have a fever.  You faint. MAKE SURE YOU:   Understand these instructions.  Will watch your condition.  Will get help right away if you are not doing well or get worse. Document Released: 12/18/2004 Document Revised: 03/12/2011 Document Reviewed: 08/07/2010 ExitCare Patient Information 2015 [employer]. This information is not intended to replace advice given to you by your health care provider. Make sure you discuss any questions you have with your health care   provider.  

## 2014-05-13 ENCOUNTER — Ambulatory Visit
Admission: RE | Admit: 2014-05-13 | Discharge: 2014-05-13 | Disposition: A | Discharge status: Home / Self Care | Payer: Medicare Other | Source: Ambulatory Visit | Attending: Radiation Oncology | Admitting: Radiation Oncology

## 2014-05-13 ENCOUNTER — Encounter: Payer: Self-pay | Admitting: *Deleted

## 2014-05-13 ENCOUNTER — Telehealth: Payer: Self-pay | Admitting: *Deleted

## 2014-05-13 ENCOUNTER — Ambulatory Visit (HOSPITAL_BASED_OUTPATIENT_CLINIC_OR_DEPARTMENT_OTHER): Payer: Medicare Other

## 2014-05-13 VITALS — BP 117/63 | HR 74 | Temp 99.1°F | Resp 18

## 2014-05-13 DIAGNOSIS — Z510 Encounter for antineoplastic radiation therapy: Secondary | ICD-10-CM | POA: Diagnosis not present

## 2014-05-13 DIAGNOSIS — L598 Other specified disorders of the skin and subcutaneous tissue related to radiation: Secondary | ICD-10-CM | POA: Diagnosis not present

## 2014-05-13 DIAGNOSIS — C01 Malignant neoplasm of base of tongue: Secondary | ICD-10-CM | POA: Diagnosis not present

## 2014-05-13 DIAGNOSIS — K1233 Oral mucositis (ulcerative) due to radiation: Secondary | ICD-10-CM | POA: Diagnosis not present

## 2014-05-13 DIAGNOSIS — R634 Abnormal weight loss: Secondary | ICD-10-CM | POA: Diagnosis not present

## 2014-05-13 DIAGNOSIS — E860 Dehydration: Secondary | ICD-10-CM | POA: Diagnosis not present

## 2014-05-13 DIAGNOSIS — F411 Generalized anxiety disorder: Secondary | ICD-10-CM | POA: Diagnosis not present

## 2014-05-13 DIAGNOSIS — B370 Candidal stomatitis: Secondary | ICD-10-CM | POA: Diagnosis not present

## 2014-05-13 MED ORDER — PROMETHAZINE HCL 25 MG/ML IJ SOLN
25.0000 mg | Freq: Once | INTRAMUSCULAR | Status: DC
  Filled 2014-05-13: qty 1

## 2014-05-13 MED ORDER — SODIUM CHLORIDE 0.9 % IJ SOLN
10.0000 mL | INTRAMUSCULAR | Status: DC | PRN
  Administered 2014-05-13: 10 mL
  Filled 2014-05-13: qty 10

## 2014-05-13 MED ORDER — SODIUM CHLORIDE 0.9 % IV SOLN
Freq: Once | INTRAVENOUS | Status: AC
  Administered 2014-05-13: 15:00:00 via INTRAVENOUS
  Filled 2014-05-13: qty 4

## 2014-05-13 MED ORDER — SODIUM CHLORIDE 0.9 % IV SOLN
Freq: Once | INTRAVENOUS | Status: AC
  Administered 2014-05-13: 14:00:00 via INTRAVENOUS

## 2014-05-13 MED ORDER — HEPARIN SOD (PORK) LOCK FLUSH 100 UNIT/ML IV SOLN
500.0000 [IU] | Freq: Once | INTRAVENOUS | Status: AC | PRN
  Administered 2014-05-13: 500 [IU]
  Filled 2014-05-13: qty 5

## 2014-05-13 NOTE — Progress Notes (Signed)
To provide support and encouragement, care continuity and to assess for needs, met with patient in Infusion while he was receiving IVF.  He was accompanied by a friend. 1. Patient reported throat pain, seemed unsure of what medications available to help with resolve.  He confirmed he is wearing Fentanyl 25 mcg/hr, is due for change this afternoon, occasionally using lidocaine, rarely taking morphine sulfate. 2. I printed medication list, highlighted pain medications - lidocaine, sucralfate, morphine sulfate, Fentanyl - I reviewed directions for each. 3. I encourage him to take medications as directed, particularly morphine sulfate. 4. I explained that to control his pain and/or adjust dosages, he must take as prescribed. 5. He verbalized understanding. 6. I indicated I would call his wife to inform her of our discussion.  Gayleen Orem, RN, BSN, [place] at [place] [phone]

## 2014-05-13 NOTE — Patient Instructions (Signed)
Dehydration, Adult Dehydration is when you lose more fluids from the body than you take in. Vital organs like the kidneys, brain, and heart cannot function without a proper amount of fluids and salt. Any loss of fluids from the body can cause dehydration.  CAUSES   Vomiting.  Diarrhea.  Excessive sweating.  Excessive urine output.  Fever. SYMPTOMS  Mild dehydration  Thirst.  Dry lips.  Slightly dry mouth. Moderate dehydration  Very dry mouth.  Sunken eyes.  Skin does not bounce back quickly when lightly pinched and released.  Dark urine and decreased urine production.  Decreased tear production.  Headache. Severe dehydration  Very dry mouth.  Extreme thirst.  Rapid, weak pulse (more than 100 beats per minute at rest).  Cold hands and feet.  Not able to sweat in spite of heat and temperature.  Rapid breathing.  Blue lips.  Confusion and lethargy.  Difficulty being awakened.  Minimal urine production.  No tears. DIAGNOSIS  Your caregiver will diagnose dehydration based on your symptoms and your exam. Blood and urine tests will help confirm the diagnosis. The diagnostic evaluation should also identify the cause of dehydration. TREATMENT  Treatment of mild or moderate dehydration can often be done at home by increasing the amount of fluids that you drink. It is best to drink small amounts of fluid more often. Drinking too much at one time can make vomiting worse. Refer to the home care instructions below. Severe dehydration needs to be treated at the hospital where you will probably be given intravenous (IV) fluids that contain water and electrolytes. HOME CARE INSTRUCTIONS   Ask your caregiver about specific rehydration instructions.  Drink enough fluids to keep your urine clear or pale yellow.  Drink small amounts frequently if you have nausea and vomiting.  Eat as you normally do.  Avoid:  Foods or drinks high in sugar.  Carbonated  drinks.  Juice.  Extremely hot or cold fluids.  Drinks with caffeine.  Fatty, greasy foods.  Alcohol.  Tobacco.  Overeating.  Gelatin desserts.  Wash your hands well to avoid spreading bacteria and viruses.  Only take over-the-counter or prescription medicines for pain, discomfort, or fever as directed by your caregiver.  Ask your caregiver if you should continue all prescribed and over-the-counter medicines.  Keep all follow-up appointments with your caregiver. SEEK MEDICAL CARE IF:  You have abdominal pain and it increases or stays in one area (localizes).  You have a rash, stiff neck, or severe headache.  You are irritable, sleepy, or difficult to awaken.  You are weak, dizzy, or extremely thirsty. SEEK IMMEDIATE MEDICAL CARE IF:   You are unable to keep fluids down or you get worse despite treatment.  You have frequent episodes of vomiting or diarrhea.  You have blood or green matter (bile) in your vomit.  You have blood in your stool or your stool looks black and tarry.  You have not urinated in 6 to 8 hours, or you have only urinated a small amount of very dark urine.  You have a fever.  You faint. MAKE SURE YOU:   Understand these instructions.  Will watch your condition.  Will get help right away if you are not doing well or get worse. Document Released: 12/18/2004 Document Revised: 03/12/2011 Document Reviewed: 08/07/2010 ExitCare Patient Information 2015 [employer]. This information is not intended to replace advice given to you by your health care provider. Make sure you discuss any questions you have with your health care   provider.  

## 2014-05-13 NOTE — Telephone Encounter (Signed)
Patient wife called with question re: Fentanyl patch.  She indicated husband took shower with patch on, expressed concern this activity jeopardized medication integrity.  She stated patch did not come off, was not detaching.  I assured her that integrity was not challenged from getting wet, encouraged her to have patient avoid direct contact with forceful shower spray in future.  Gayleen Orem, RN, BSN, [place] at [place] [phone]

## 2014-05-13 NOTE — Telephone Encounter (Signed)
05/12/14 1815  Spoke with patient's wife, Vicente Males.    Shared my discussion with patient re: his reporting of uncontrolled throat pain, my guidance for taking pain medications.  She confirmed he is compliant with Fentanyl, stated he is willing to occasionally take lidocaine, sucralfate.  He refuses to take morphine sulfate, "he just won't".  She indicated she will continue to encourage him to take medications as prescribed.  Gayleen Orem, RN, BSN, [place] at [place] [phone]

## 2014-05-13 NOTE — Progress Notes (Signed)
Entry error

## 2014-05-14 ENCOUNTER — Ambulatory Visit (HOSPITAL_BASED_OUTPATIENT_CLINIC_OR_DEPARTMENT_OTHER): Payer: Medicare Other

## 2014-05-14 ENCOUNTER — Ambulatory Visit
Admission: RE | Admit: 2014-05-14 | Discharge: 2014-05-14 | Disposition: A | Discharge status: Home / Self Care | Payer: Medicare Other | Source: Ambulatory Visit | Attending: Radiation Oncology | Admitting: Radiation Oncology

## 2014-05-14 VITALS — BP 128/63 | HR 80 | Temp 98.2°F | Resp 18

## 2014-05-14 DIAGNOSIS — E860 Dehydration: Secondary | ICD-10-CM | POA: Diagnosis not present

## 2014-05-14 DIAGNOSIS — L598 Other specified disorders of the skin and subcutaneous tissue related to radiation: Secondary | ICD-10-CM | POA: Diagnosis not present

## 2014-05-14 DIAGNOSIS — R634 Abnormal weight loss: Secondary | ICD-10-CM

## 2014-05-14 DIAGNOSIS — F411 Generalized anxiety disorder: Secondary | ICD-10-CM | POA: Diagnosis not present

## 2014-05-14 DIAGNOSIS — K1233 Oral mucositis (ulcerative) due to radiation: Secondary | ICD-10-CM | POA: Diagnosis not present

## 2014-05-14 DIAGNOSIS — C01 Malignant neoplasm of base of tongue: Secondary | ICD-10-CM

## 2014-05-14 DIAGNOSIS — Z510 Encounter for antineoplastic radiation therapy: Secondary | ICD-10-CM | POA: Diagnosis not present

## 2014-05-14 DIAGNOSIS — B370 Candidal stomatitis: Secondary | ICD-10-CM | POA: Diagnosis not present

## 2014-05-14 MED ORDER — SODIUM CHLORIDE 0.9 % IJ SOLN
10.0000 mL | INTRAMUSCULAR | Status: DC | PRN
  Administered 2014-05-14: 10 mL
  Filled 2014-05-14: qty 10

## 2014-05-14 MED ORDER — SODIUM CHLORIDE 0.9 % IV SOLN
Freq: Once | INTRAVENOUS | Status: AC
  Administered 2014-05-14: 15:00:00 via INTRAVENOUS

## 2014-05-14 MED ORDER — HEPARIN SOD (PORK) LOCK FLUSH 100 UNIT/ML IV SOLN
500.0000 [IU] | Freq: Once | INTRAVENOUS | Status: AC | PRN
  Administered 2014-05-14: 500 [IU]
  Filled 2014-05-14: qty 5

## 2014-05-14 MED ORDER — PROMETHAZINE HCL 25 MG/ML IJ SOLN
25.0000 mg | Freq: Once | INTRAMUSCULAR | Status: AC
  Administered 2014-05-14: 25 mg via INTRAVENOUS
  Filled 2014-05-14: qty 1

## 2014-05-14 NOTE — Patient Instructions (Signed)
Dehydration, Adult Dehydration is when you lose more fluids from the body than you take in. Vital organs like the kidneys, brain, and heart cannot function without a proper amount of fluids and salt. Any loss of fluids from the body can cause dehydration.  CAUSES   Vomiting.  Diarrhea.  Excessive sweating.  Excessive urine output.  Fever. SYMPTOMS  Mild dehydration  Thirst.  Dry lips.  Slightly dry mouth. Moderate dehydration  Very dry mouth.  Sunken eyes.  Skin does not bounce back quickly when lightly pinched and released.  Dark urine and decreased urine production.  Decreased tear production.  Headache. Severe dehydration  Very dry mouth.  Extreme thirst.  Rapid, weak pulse (more than 100 beats per minute at rest).  Cold hands and feet.  Not able to sweat in spite of heat and temperature.  Rapid breathing.  Blue lips.  Confusion and lethargy.  Difficulty being awakened.  Minimal urine production.  No tears. DIAGNOSIS  Your caregiver will diagnose dehydration based on your symptoms and your exam. Blood and urine tests will help confirm the diagnosis. The diagnostic evaluation should also identify the cause of dehydration. TREATMENT  Treatment of mild or moderate dehydration can often be done at home by increasing the amount of fluids that you drink. It is best to drink small amounts of fluid more often. Drinking too much at one time can make vomiting worse. Refer to the home care instructions below. Severe dehydration needs to be treated at the hospital where you will probably be given intravenous (IV) fluids that contain water and electrolytes. HOME CARE INSTRUCTIONS   Ask your caregiver about specific rehydration instructions.  Drink enough fluids to keep your urine clear or pale yellow.  Drink small amounts frequently if you have nausea and vomiting.  Eat as you normally do.  Avoid:  Foods or drinks high in sugar.  Carbonated  drinks.  Juice.  Extremely hot or cold fluids.  Drinks with caffeine.  Fatty, greasy foods.  Alcohol.  Tobacco.  Overeating.  Gelatin desserts.  Wash your hands well to avoid spreading bacteria and viruses.  Only take over-the-counter or prescription medicines for pain, discomfort, or fever as directed by your caregiver.  Ask your caregiver if you should continue all prescribed and over-the-counter medicines.  Keep all follow-up appointments with your caregiver. SEEK MEDICAL CARE IF:  You have abdominal pain and it increases or stays in one area (localizes).  You have a rash, stiff neck, or severe headache.  You are irritable, sleepy, or difficult to awaken.  You are weak, dizzy, or extremely thirsty. SEEK IMMEDIATE MEDICAL CARE IF:   You are unable to keep fluids down or you get worse despite treatment.  You have frequent episodes of vomiting or diarrhea.  You have blood or green matter (bile) in your vomit.  You have blood in your stool or your stool looks black and tarry.  You have not urinated in 6 to 8 hours, or you have only urinated a small amount of very dark urine.  You have a fever.  You faint. MAKE SURE YOU:   Understand these instructions.  Will watch your condition.  Will get help right away if you are not doing well or get worse. Document Released: 12/18/2004 Document Revised: 03/12/2011 Document Reviewed: 08/07/2010 ExitCare Patient Information 2015 [employer]. This information is not intended to replace advice given to you by your health care provider. Make sure you discuss any questions you have with your health care   provider.  

## 2014-05-15 ENCOUNTER — Ambulatory Visit (HOSPITAL_BASED_OUTPATIENT_CLINIC_OR_DEPARTMENT_OTHER): Payer: Medicare Other

## 2014-05-15 VITALS — BP 103/60 | HR 79 | Temp 98.3°F | Resp 18

## 2014-05-15 DIAGNOSIS — C01 Malignant neoplasm of base of tongue: Secondary | ICD-10-CM

## 2014-05-15 MED ORDER — SODIUM CHLORIDE 0.9 % IJ SOLN
10.0000 mL | INTRAMUSCULAR | Status: DC | PRN
  Administered 2014-05-15: 10 mL
  Filled 2014-05-15: qty 10

## 2014-05-15 MED ORDER — HEPARIN SOD (PORK) LOCK FLUSH 100 UNIT/ML IV SOLN
500.0000 [IU] | Freq: Once | INTRAVENOUS | Status: AC | PRN
  Administered 2014-05-15: 500 [IU]
  Filled 2014-05-15: qty 5

## 2014-05-15 MED ORDER — SODIUM CHLORIDE 0.9 % IV SOLN
Freq: Once | INTRAVENOUS | Status: AC
  Administered 2014-05-15: 08:00:00 via INTRAVENOUS

## 2014-05-17 ENCOUNTER — Encounter: Payer: Self-pay | Admitting: Hematology and Oncology

## 2014-05-17 ENCOUNTER — Ambulatory Visit
Admission: RE | Admit: 2014-05-17 | Discharge: 2014-05-17 | Disposition: A | Discharge status: Home / Self Care | Payer: Medicare Other | Source: Ambulatory Visit | Attending: Radiation Oncology | Admitting: Radiation Oncology

## 2014-05-17 ENCOUNTER — Ambulatory Visit (HOSPITAL_BASED_OUTPATIENT_CLINIC_OR_DEPARTMENT_OTHER): Payer: Medicare Other | Admitting: Hematology and Oncology

## 2014-05-17 ENCOUNTER — Ambulatory Visit (HOSPITAL_BASED_OUTPATIENT_CLINIC_OR_DEPARTMENT_OTHER): Payer: Medicare Other

## 2014-05-17 ENCOUNTER — Telehealth: Payer: Self-pay | Admitting: Hematology and Oncology

## 2014-05-17 ENCOUNTER — Encounter: Payer: Self-pay | Admitting: Radiation Oncology

## 2014-05-17 VITALS — BP 113/59 | HR 68 | Temp 98.2°F | Resp 16 | Ht 73.0 in | Wt 181.2 lb

## 2014-05-17 DIAGNOSIS — R112 Nausea with vomiting, unspecified: Secondary | ICD-10-CM | POA: Insufficient documentation

## 2014-05-17 DIAGNOSIS — K1231 Oral mucositis (ulcerative) due to antineoplastic therapy: Secondary | ICD-10-CM | POA: Diagnosis not present

## 2014-05-17 DIAGNOSIS — C01 Malignant neoplasm of base of tongue: Secondary | ICD-10-CM | POA: Diagnosis not present

## 2014-05-17 DIAGNOSIS — E860 Dehydration: Secondary | ICD-10-CM

## 2014-05-17 DIAGNOSIS — R110 Nausea: Secondary | ICD-10-CM

## 2014-05-17 DIAGNOSIS — R1310 Dysphagia, unspecified: Secondary | ICD-10-CM | POA: Diagnosis not present

## 2014-05-17 DIAGNOSIS — Z931 Gastrostomy status: Secondary | ICD-10-CM | POA: Insufficient documentation

## 2014-05-17 DIAGNOSIS — Z79891 Long term (current) use of opiate analgesic: Secondary | ICD-10-CM | POA: Insufficient documentation

## 2014-05-17 DIAGNOSIS — F411 Generalized anxiety disorder: Secondary | ICD-10-CM | POA: Diagnosis not present

## 2014-05-17 DIAGNOSIS — L598 Other specified disorders of the skin and subcutaneous tissue related to radiation: Secondary | ICD-10-CM | POA: Diagnosis not present

## 2014-05-17 DIAGNOSIS — L539 Erythematous condition, unspecified: Secondary | ICD-10-CM | POA: Insufficient documentation

## 2014-05-17 DIAGNOSIS — R410 Disorientation, unspecified: Secondary | ICD-10-CM | POA: Diagnosis not present

## 2014-05-17 DIAGNOSIS — R634 Abnormal weight loss: Secondary | ICD-10-CM

## 2014-05-17 DIAGNOSIS — T451X5A Adverse effect of antineoplastic and immunosuppressive drugs, initial encounter: Secondary | ICD-10-CM

## 2014-05-17 DIAGNOSIS — K1233 Oral mucositis (ulcerative) due to radiation: Secondary | ICD-10-CM | POA: Insufficient documentation

## 2014-05-17 DIAGNOSIS — L599 Disorder of the skin and subcutaneous tissue related to radiation, unspecified: Secondary | ICD-10-CM | POA: Insufficient documentation

## 2014-05-17 DIAGNOSIS — Z510 Encounter for antineoplastic radiation therapy: Secondary | ICD-10-CM | POA: Diagnosis not present

## 2014-05-17 DIAGNOSIS — B370 Candidal stomatitis: Secondary | ICD-10-CM | POA: Diagnosis not present

## 2014-05-17 MED ORDER — HEPARIN SOD (PORK) LOCK FLUSH 100 UNIT/ML IV SOLN
250.0000 [IU] | Freq: Once | INTRAVENOUS | Status: DC | PRN
  Filled 2014-05-17: qty 5

## 2014-05-17 MED ORDER — HEPARIN SOD (PORK) LOCK FLUSH 100 UNIT/ML IV SOLN
500.0000 [IU] | Freq: Once | INTRAVENOUS | Status: AC | PRN
  Administered 2014-05-17: 500 [IU]
  Filled 2014-05-17: qty 5

## 2014-05-17 MED ORDER — SODIUM CHLORIDE 0.9 % IJ SOLN
10.0000 mL | INTRAMUSCULAR | Status: DC | PRN
  Administered 2014-05-17: 10 mL
  Filled 2014-05-17: qty 10

## 2014-05-17 MED ORDER — ALTEPLASE 2 MG IJ SOLR
2.0000 mg | Freq: Once | INTRAMUSCULAR | Status: DC | PRN
  Filled 2014-05-17: qty 2

## 2014-05-17 MED ORDER — FENTANYL 25 MCG/HR TD PT72: 25.0000 ug | MEDICATED_PATCH | TRANSDERMAL | Status: DC

## 2014-05-17 MED ORDER — BIAFINE EX EMUL
Freq: Once | CUTANEOUS | Status: AC
  Administered 2014-05-17: 15:00:00 via TOPICAL

## 2014-05-17 MED ORDER — SODIUM CHLORIDE 0.9 % IV SOLN
Freq: Once | INTRAVENOUS | Status: AC
  Administered 2014-05-17: 15:00:00 via INTRAVENOUS

## 2014-05-17 NOTE — Assessment & Plan Note (Signed)
He has minimum oral fluid intake over the weekend. I'm concerned about dehydration. He will continue daily IV fluids until he completes all his treatment Today, I will give him 1.5 L instead of just 1 L due to recent nausea and vomiting over the weekend.

## 2014-05-17 NOTE — Progress Notes (Addendum)
Dylan Moreno has completed 30 fractions to his bilateral neck.  He denies pain now but is using a 25 mcg fentanyl patch and took morphine 0.5 mg for the first time twice today.  He reports pain with swallowing.  He is not taking in hardly anything by mouth.  He is taking in about 5 cans per day of osmolite 1.5 through his peg tube per day.  He had nausea over the weeking and vomited once.  He is taking zofran and compazine.  He also reported having pain with his Sunday feeding that is gone now.  His peg tube is intact.  He has a white coating on his tongue.  He has one difulcan tablet left.  He reports feeling confused today.  His family reports this is not new but is getting worse.  His family reports he has started shaking.  He also feels week.  His bp was low at 84/58 and then 113/59 when retaken.  He has lost 3 lbs since last week.  The skin on both sides of his neck is red with dry desquamation.  He is using radiaplex gel.    BP 113/59 mmHg  Pulse 68  Temp(Src) 98.2 F (36.8 C) (Oral)  Resp 16  Ht 6\' 1"  (1.854 m)  Wt 181 lb 3.2 oz (82.192 kg)  BMI 23.91 kg/m2  SpO2 98%   Wt Readings from Last 3 Encounters:  05/10/14 184 lb (83.462 kg)  05/03/14 188 lb 6.4 oz (85.458 kg)  04/21/14 186 lb (84.369 kg)

## 2014-05-17 NOTE — Assessment & Plan Note (Signed)
He has significant weight loss recently. He could barely tolerate 5 cans of nutritional supplement per day. I told the patient he needs to increase it to 6 cans minimum per day and I will get dietitian to continue to follow him closely on a weekly basis if possible

## 2014-05-17 NOTE — Progress Notes (Signed)
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  He is seen today as part of his weekly supportive care visit. Over the weekend, he complained of severe pain. He finally started taking liquid morphine for breakthrough pain medicine.  he had episode of nausea and vomiting over the weekend after nutritional supplement.  denies fevers or chills.  REVIEW OF SYSTEMS:   Constitutional: Denies fevers, chills or abnormal weight loss Eyes: Denies blurriness of vision Respiratory: Denies cough, dyspnea or wheezes Cardiovascular: Denies palpitation, chest discomfort or lower extremity swelling Skin: Denies abnormal skin rashes Lymphatics: Denies new lymphadenopathy or easy bruising Neurological:Denies numbness, tingling or new weaknesses Behavioral/Psych: Mood is stable, no new changes  All other systems were reviewed with the patient and are negative.  I have reviewed the past  medical history, past surgical history, social history and family history with the patient and they are unchanged from previous note.  ALLERGIES:  has No Known Allergies.  MEDICATIONS:  Current Outpatient Prescriptions  Medication Sig Dispense Refill  . amphetamine-dextroamphetamine (ADDERALL) 20 MG tablet Take 20 mg by mouth 2 (two) times daily.    Marland Kitchen aspirin 81 MG tablet Take 81 mg by mouth daily.    Marland Kitchen atorvastatin (LIPITOR) 10 MG tablet Take 10 mg by mouth daily.    Marland Kitchen emollient (BIAFINE) cream Apply topically as needed.    . fentaNYL (DURAGESIC - DOSED MCG/HR) 25 MCG/HR patch Place 1 patch (25 mcg total) onto the skin every 3 (three) days. 5 patch 0  . fluconazole (DIFLUCAN) 100 MG tablet Take 2 tablets today, then 1 tablet daily x 20 more days for yeast in mouth. 22 tablet 0  . levothyroxine (SYNTHROID, LEVOTHROID) 75 MCG tablet Take 75 mcg by mouth daily before breakfast.     . lidocaine (XYLOCAINE) 2 % solution Patient: Mix 1part 2% viscous lidocaine, 1part H20. Swish and/or swallow 21mL of this mixture, 24min before meals and at bedtime, up to QID 100 mL 5  . lidocaine-prilocaine (EMLA) cream Apply to affected area once 30 g 3  . LORazepam (ATIVAN) 0.5 MG tablet Take 1 tablet approximately 30 min prior to radiotherapy for anxiety/claustrophobia (Patient not taking: Reported on 05/17/2014) 40 tablet 0  . Morphine Sulfate (MORPHINE CONCENTRATE) 10 MG/0.5ML SOLN concentrated solution Take 0.5 mLs (10 mg total) by mouth every 2 (two) hours as needed. 240 mL 0  . Multiple Vitamin (MULTIVITAMIN WITH MINERALS) TABS tablet Take 1 tablet by mouth daily.    . ondansetron (ZOFRAN) 8 MG tablet  Take 1 tablet (8 mg total) by mouth every 8 (eight) hours as needed. 30 tablet 1  . prochlorperazine (COMPAZINE) 10 MG tablet Take 1 tablet (10 mg total) by mouth every 6 (six) hours as needed (Nausea or vomiting). 30 tablet 1  . ranitidine (ZANTAC) 150 MG/10ML syrup Take 10 mLs (150 mg total) by mouth 2 (two)  times daily. May flush into PEG tube. To prevent indigestion. (Patient not taking: Reported on 05/17/2014) 300 mL 3  . sodium fluoride (FLUORISHIELD) 1.1 % GEL dental gel Instill one drop of gel per tooth space of fluoride tray. Place over teeth for 5 minutes. Remove. Spit out excess. Repeat nightly. 120 mL prn  . sucralfate (CARAFATE) 1 G tablet Dissolve 1 tablet in 10 mL H20 and swallow QID PRN sore throat. 40 tablet 5  . Wound Dressings (RADIAGEL) GEL Apply topically 2 (two) times daily.     No current facility-administered medications for this visit.   Facility-Administered Medications Ordered in Other Visits  Medication Dose Route Frequency Provider Last Rate Last Dose  . alteplase (CATHFLO ACTIVASE) injection 2 mg  2 mg Intracatheter Once PRN Heath Lark, MD      . heparin lock flush 100 unit/mL  500 Units Intracatheter Once PRN Heath Lark, MD      . heparin lock flush 100 unit/mL  250 Units Intracatheter Once PRN Heath Lark, MD      . sodium chloride 0.9 % injection 10 mL  10 mL Intracatheter PRN Heath Lark, MD        PHYSICAL EXAMINATION: ECOG PERFORMANCE STATUS: 1 - Symptomatic but completely ambulatory GENERAL:alert, no distress and comfortable SKIN:  Noted radiation-induced skin dermatitis around his neck. Small ulceration is seen. EYES: normal, Conjunctiva are pink and non-injected, sclera clear OROPHARYNX: Dry mucous membrane. No thrush.  NECK: supple, thyroid normal size, non-tender, without nodularity LYMPH:   Previous he palpable lymphadenopathy has improved. LUNGS: clear to auscultation and percussion with normal breathing effort HEART: regular rate & rhythm and no murmurs and no lower extremity edema ABDOMEN:abdomen soft, non-tender and normal bowel sounds. Feeding tube site looks okay Musculoskeletal:no cyanosis of digits and no clubbing  NEURO: alert & oriented x 3 with fluent speech, no focal motor/sensory deficits  LABORATORY DATA:  I have reviewed the data as  listed    Component Value Date/Time   NA 136 05/03/2014 1142   NA 139 04/02/2014 1230   K 4.4 05/03/2014 1142   K 4.2 04/02/2014 1230   CL 105 04/02/2014 1230   CO2 25 05/03/2014 1142   CO2 25 04/02/2014 1230   GLUCOSE 169* 05/03/2014 1142   GLUCOSE 87 04/02/2014 1230   BUN 19.5 05/03/2014 1142   BUN 21 04/02/2014 1230   CREATININE 0.9 05/03/2014 1142   CREATININE 0.85 04/02/2014 1230   CALCIUM 9.8 05/03/2014 1142   CALCIUM 9.6 04/02/2014 1230   PROT 7.1 05/03/2014 1142   PROT 7.0 12/30/2012 0545   ALBUMIN 3.0* 05/03/2014 1142   ALBUMIN 3.7 12/30/2012 0545   AST 37* 05/03/2014 1142   AST 27 12/30/2012 0545   ALT 49 05/03/2014 1142   ALT 31 12/30/2012 0545   ALKPHOS 97 05/03/2014 1142   ALKPHOS 86 12/30/2012 0545   BILITOT 0.53 05/03/2014 1142   BILITOT 0.7 12/30/2012 0545   GFRNONAA 85* 04/02/2014 1230   GFRAA >90 04/02/2014 1230    No results found for: SPEP, UPEP  Lab Results  Component Value Date   WBC 4.8 05/03/2014   NEUTROABS  3.7 05/03/2014   HGB 12.1* 05/03/2014   HCT 34.9* 05/03/2014   MCV 87.0 05/03/2014   PLT 354 05/03/2014      Chemistry      Component Value Date/Time   NA 136 05/03/2014 1142   NA 139 04/02/2014 1230   K 4.4 05/03/2014 1142   K 4.2 04/02/2014 1230   CL 105 04/02/2014 1230   CO2 25 05/03/2014 1142   CO2 25 04/02/2014 1230   BUN 19.5 05/03/2014 1142   BUN 21 04/02/2014 1230   CREATININE 0.9 05/03/2014 1142   CREATININE 0.85 04/02/2014 1230      Component Value Date/Time   CALCIUM 9.8 05/03/2014 1142   CALCIUM 9.6 04/02/2014 1230   ALKPHOS 97 05/03/2014 1142   ALKPHOS 86 12/30/2012 0545   AST 37* 05/03/2014 1142   AST 27 12/30/2012 0545   ALT 49 05/03/2014 1142   ALT 31 12/30/2012 0545   BILITOT 0.53 05/03/2014 1142   BILITOT 0.7 12/30/2012 0545      ASSESSMENT & PLAN:  Malignant neoplasm of base of tongue The patient is starting to experience significant side effects from treatment. He has side effects expected  from treatment I will continue supportive care for now   Chemotherapy-induced nausea He has significant nausea with cycle 1 of treatment. He denies further nausea since we started him on IV fluids daily. I will continue the same for now.      Dehydration He has minimum oral fluid intake over the weekend. I'm concerned about dehydration. He will continue daily IV fluids until he completes all his treatment Today, I will give him 1.5 L instead of just 1 L due to recent nausea and vomiting over the weekend.   Mucositis due to chemotherapy He has significant mucositis.  I refill his prescription fentanyl patch. Finally, the patient is comfortable using liquid morphine for breakthrough pain medicine.   Weight loss He has significant weight loss recently. He could barely tolerate 5 cans of nutritional supplement per day. I told the patient he needs to increase it to 6 cans minimum per day and I will get dietitian to continue to follow him closely on a weekly basis if possible       No orders of the defined types were placed in this encounter.   All questions were answered. The patient knows to call the clinic with any problems, questions or concerns. No barriers to learning was detected. I spent 25 minutes counseling the patient face to face. The total time spent in the appointment was 30 minutes and more than 50% was on counseling and review of test results     [facility], [place], MD 05/17/2014 4:28 PM

## 2014-05-17 NOTE — Assessment & Plan Note (Signed)
He has significant mucositis.  I refill his prescription fentanyl patch. Finally, the patient is comfortable using liquid morphine for breakthrough pain medicine.

## 2014-05-17 NOTE — Patient Instructions (Signed)
Dehydration, Adult Dehydration is when you lose more fluids from the body than you take in. Vital organs like the kidneys, brain, and heart cannot function without a proper amount of fluids and salt. Any loss of fluids from the body can cause dehydration.  CAUSES   Vomiting.  Diarrhea.  Excessive sweating.  Excessive urine output.  Fever. SYMPTOMS  Mild dehydration  Thirst.  Dry lips.  Slightly dry mouth. Moderate dehydration  Very dry mouth.  Sunken eyes.  Skin does not bounce back quickly when lightly pinched and released.  Dark urine and decreased urine production.  Decreased tear production.  Headache. Severe dehydration  Very dry mouth.  Extreme thirst.  Rapid, weak pulse (more than 100 beats per minute at rest).  Cold hands and feet.  Not able to sweat in spite of heat and temperature.  Rapid breathing.  Blue lips.  Confusion and lethargy.  Difficulty being awakened.  Minimal urine production.  No tears. DIAGNOSIS  Your caregiver will diagnose dehydration based on your symptoms and your exam. Blood and urine tests will help confirm the diagnosis. The diagnostic evaluation should also identify the cause of dehydration. TREATMENT  Treatment of mild or moderate dehydration can often be done at home by increasing the amount of fluids that you drink. It is best to drink small amounts of fluid more often. Drinking too much at one time can make vomiting worse. Refer to the home care instructions below. Severe dehydration needs to be treated at the hospital where you will probably be given intravenous (IV) fluids that contain water and electrolytes. HOME CARE INSTRUCTIONS   Ask your caregiver about specific rehydration instructions.  Drink enough fluids to keep your urine clear or pale yellow.  Drink small amounts frequently if you have nausea and vomiting.  Eat as you normally do.  Avoid:  Foods or drinks high in sugar.  Carbonated  drinks.  Juice.  Extremely hot or cold fluids.  Drinks with caffeine.  Fatty, greasy foods.  Alcohol.  Tobacco.  Overeating.  Gelatin desserts.  Wash your hands well to avoid spreading bacteria and viruses.  Only take over-the-counter or prescription medicines for pain, discomfort, or fever as directed by your caregiver.  Ask your caregiver if you should continue all prescribed and over-the-counter medicines.  Keep all follow-up appointments with your caregiver. SEEK MEDICAL CARE IF:  You have abdominal pain and it increases or stays in one area (localizes).  You have a rash, stiff neck, or severe headache.  You are irritable, sleepy, or difficult to awaken.  You are weak, dizzy, or extremely thirsty. SEEK IMMEDIATE MEDICAL CARE IF:   You are unable to keep fluids down or you get worse despite treatment.  You have frequent episodes of vomiting or diarrhea.  You have blood or green matter (bile) in your vomit.  You have blood in your stool or your stool looks black and tarry.  You have not urinated in 6 to 8 hours, or you have only urinated a small amount of very dark urine.  You have a fever.  You faint. MAKE SURE YOU:   Understand these instructions.  Will watch your condition.  Will get help right away if you are not doing well or get worse. Document Released: 12/18/2004 Document Revised: 03/12/2011 Document Reviewed: 08/07/2010 ExitCare Patient Information 2015 [employer]. This information is not intended to replace advice given to you by your health care provider. Make sure you discuss any questions you have with your health care   provider.  

## 2014-05-17 NOTE — Assessment & Plan Note (Signed)
He has significant nausea with cycle 1 of treatment. He denies further nausea since we started him on IV fluids daily. I will continue the same for now.

## 2014-05-17 NOTE — Telephone Encounter (Signed)
Gave pt's wife AVS and Calendar sent msg to add IVF's per 05/16 POF..... KJ

## 2014-05-17 NOTE — Assessment & Plan Note (Signed)
The patient is starting to experience significant side effects from treatment. He has side effects expected from treatment I will continue supportive care for now

## 2014-05-17 NOTE — Progress Notes (Signed)
Weekly Management Note:  Site: Base of tongue/bilateral neck Current Dose:  6000  cGy Projected Dose: 7000  cGy  Narrative: The patient is seen today for routine under treatment assessment. CBCT/MVCT images/port films were reviewed. The chart was reviewed.   He is having a difficult time despite using a 25 g fentanyl patch and starting morphine sulfate immediate release today.  He is not taking in any nutrition by mouth, he also has difficulty swallowing liquids.  He is getting IV fluids through Dr. Alvy Bimler.  He is getting in approximately 5 cans of Osmolite a day.  His chemotherapy has been delayed.  He has some nausea and vomiting over the weekend and he is taking Zofran/Compazine.  He became nauseous after bolus feeding through his PEG tube.  His family feels that he is somewhat confused as well.  He is lost 3 pounds since last week.  He uses Radioplex gel along his neck.  Physical Examination:  Filed Vitals:   05/17/14 1433  BP: 113/59  Pulse: 68  Temp:   Resp:   .  Weight: 181 lb 3.2 oz (82.192 kg).  There is erythema and dry desquamation along the neck bilaterally.  There may be residual adenopathy along his left neck.  On inspection oral cavity he does have a whitish tongue but really no other definite evidence for candidiasis.  Indirect mirror examination not performed today.  Laboratory data: Lab Results  Component Value Date   WBC 4.8 05/03/2014   HGB 12.1* 05/03/2014   HCT 34.9* 05/03/2014   MCV 87.0 05/03/2014   PLT 354 05/03/2014   CMP     Component Value Date/Time   NA 136 05/03/2014 1142   NA 139 04/02/2014 1230   K 4.4 05/03/2014 1142   K 4.2 04/02/2014 1230   CL 105 04/02/2014 1230   CO2 25 05/03/2014 1142   CO2 25 04/02/2014 1230   GLUCOSE 169* 05/03/2014 1142   GLUCOSE 87 04/02/2014 1230   BUN 19.5 05/03/2014 1142   BUN 21 04/02/2014 1230   CREATININE 0.9 05/03/2014 1142   CREATININE 0.85 04/02/2014 1230   CALCIUM 9.8 05/03/2014 1142   CALCIUM 9.6  04/02/2014 1230   PROT 7.1 05/03/2014 1142   PROT 7.0 12/30/2012 0545   ALBUMIN 3.0* 05/03/2014 1142   ALBUMIN 3.7 12/30/2012 0545   AST 37* 05/03/2014 1142   AST 27 12/30/2012 0545   ALT 49 05/03/2014 1142   ALT 31 12/30/2012 0545   ALKPHOS 97 05/03/2014 1142   ALKPHOS 86 12/30/2012 0545   BILITOT 0.53 05/03/2014 1142   BILITOT 0.7 12/30/2012 0545   GFRNONAA 85* 04/02/2014 1230   GFRAA >90 04/02/2014 1230    Impression: 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  He does require IV fluids.  I told the patient and his family that we need to push ahead with his radiation therapy.  Plan: Continue radiation therapy as planned.

## 2014-05-18 ENCOUNTER — Ambulatory Visit (HOSPITAL_BASED_OUTPATIENT_CLINIC_OR_DEPARTMENT_OTHER): Payer: Medicare Other

## 2014-05-18 ENCOUNTER — Telehealth: Payer: Self-pay | Admitting: *Deleted

## 2014-05-18 ENCOUNTER — Ambulatory Visit
Admission: RE | Admit: 2014-05-18 | Discharge: 2014-05-18 | Disposition: A | Discharge status: Home / Self Care | Payer: Medicare Other | Source: Ambulatory Visit | Attending: Radiation Oncology | Admitting: Radiation Oncology

## 2014-05-18 VITALS — BP 124/60 | HR 79 | Temp 99.3°F | Resp 18

## 2014-05-18 DIAGNOSIS — C01 Malignant neoplasm of base of tongue: Secondary | ICD-10-CM

## 2014-05-18 DIAGNOSIS — E860 Dehydration: Secondary | ICD-10-CM

## 2014-05-18 DIAGNOSIS — L598 Other specified disorders of the skin and subcutaneous tissue related to radiation: Secondary | ICD-10-CM | POA: Diagnosis not present

## 2014-05-18 DIAGNOSIS — F411 Generalized anxiety disorder: Secondary | ICD-10-CM | POA: Diagnosis not present

## 2014-05-18 DIAGNOSIS — B370 Candidal stomatitis: Secondary | ICD-10-CM | POA: Diagnosis not present

## 2014-05-18 DIAGNOSIS — K1233 Oral mucositis (ulcerative) due to radiation: Secondary | ICD-10-CM | POA: Diagnosis not present

## 2014-05-18 DIAGNOSIS — Z510 Encounter for antineoplastic radiation therapy: Secondary | ICD-10-CM | POA: Diagnosis not present

## 2014-05-18 MED ORDER — SODIUM CHLORIDE 0.9 % IJ SOLN
10.0000 mL | INTRAMUSCULAR | Status: DC | PRN
Start: 2014-05-18 — End: 2014-05-18
  Administered 2014-05-18: 10 mL
  Filled 2014-05-18: qty 10

## 2014-05-18 MED ORDER — SODIUM CHLORIDE 0.9 % IV SOLN
1000.0000 mL | Freq: Once | INTRAVENOUS | Status: AC
  Administered 2014-05-18: 1000 mL via INTRAVENOUS

## 2014-05-18 MED ORDER — HEPARIN SOD (PORK) LOCK FLUSH 100 UNIT/ML IV SOLN
500.0000 [IU] | Freq: Once | INTRAVENOUS | Status: AC | PRN
  Administered 2014-05-18: 500 [IU]
  Filled 2014-05-18: qty 5

## 2014-05-18 NOTE — Patient Instructions (Signed)
Dehydration, Adult Dehydration is when you lose more fluids from the body than you take in. Vital organs like the kidneys, brain, and heart cannot function without a proper amount of fluids and salt. Any loss of fluids from the body can cause dehydration.  CAUSES   Vomiting.  Diarrhea.  Excessive sweating.  Excessive urine output.  Fever. SYMPTOMS  Mild dehydration  Thirst.  Dry lips.  Slightly dry mouth. Moderate dehydration  Very dry mouth.  Sunken eyes.  Skin does not bounce back quickly when lightly pinched and released.  Dark urine and decreased urine production.  Decreased tear production.  Headache. Severe dehydration  Very dry mouth.  Extreme thirst.  Rapid, weak pulse (more than 100 beats per minute at rest).  Cold hands and feet.  Not able to sweat in spite of heat and temperature.  Rapid breathing.  Blue lips.  Confusion and lethargy.  Difficulty being awakened.  Minimal urine production.  No tears. DIAGNOSIS  Your caregiver will diagnose dehydration based on your symptoms and your exam. Blood and urine tests will help confirm the diagnosis. The diagnostic evaluation should also identify the cause of dehydration. TREATMENT  Treatment of mild or moderate dehydration can often be done at home by increasing the amount of fluids that you drink. It is best to drink small amounts of fluid more often. Drinking too much at one time can make vomiting worse. Refer to the home care instructions below. Severe dehydration needs to be treated at the hospital where you will probably be given intravenous (IV) fluids that contain water and electrolytes. HOME CARE INSTRUCTIONS   Ask your caregiver about specific rehydration instructions.  Drink enough fluids to keep your urine clear or pale yellow.  Drink small amounts frequently if you have nausea and vomiting.  Eat as you normally do.  Avoid:  Foods or drinks high in sugar.  Carbonated  drinks.  Juice.  Extremely hot or cold fluids.  Drinks with caffeine.  Fatty, greasy foods.  Alcohol.  Tobacco.  Overeating.  Gelatin desserts.  Wash your hands well to avoid spreading bacteria and viruses.  Only take over-the-counter or prescription medicines for pain, discomfort, or fever as directed by your caregiver.  Ask your caregiver if you should continue all prescribed and over-the-counter medicines.  Keep all follow-up appointments with your caregiver. SEEK MEDICAL CARE IF:  You have abdominal pain and it increases or stays in one area (localizes).  You have a rash, stiff neck, or severe headache.  You are irritable, sleepy, or difficult to awaken.  You are weak, dizzy, or extremely thirsty. SEEK IMMEDIATE MEDICAL CARE IF:   You are unable to keep fluids down or you get worse despite treatment.  You have frequent episodes of vomiting or diarrhea.  You have blood or green matter (bile) in your vomit.  You have blood in your stool or your stool looks black and tarry.  You have not urinated in 6 to 8 hours, or you have only urinated a small amount of very dark urine.  You have a fever.  You faint. MAKE SURE YOU:   Understand these instructions.  Will watch your condition.  Will get help right away if you are not doing well or get worse. Document Released: 12/18/2004 Document Revised: 03/12/2011 Document Reviewed: 08/07/2010 ExitCare Patient Information 2015 [employer]. This information is not intended to replace advice given to you by your health care provider. Make sure you discuss any questions you have with your health care   provider.  

## 2014-05-18 NOTE — Telephone Encounter (Signed)
Per staff message and POF I have scheduled appts. Advised scheduler of appts. No available 5/3, advised scheduler    JMW

## 2014-05-18 NOTE — Telephone Encounter (Signed)
Oncology Nurse Navigator Documentation  Oncology Nurse Navigator Flowsheets 05/18/2014  Navigator Encounter Type Telephone - medication guidance  Barriers/Navigation Needs No barriers at this time  Interventions None required  Time Spent with Patient 15   Patient son called for clarification of morphine sulfate administration.    We discussed Rx written for 0.5 mls q 2hr.  We discussed if relief not obtained in conjunction with current 25 mcg Fentanyl, he will notify me for increased dosage per their discussion with Dr. Alvy Bimler yesterday. He stated that patient is now agreeable to taking Sucralfate which is helping with throat discomfort. He understands I can be contacted with additional questions/concerns.  Gayleen Orem, RN, BSN, [place] at [place] [phone]

## 2014-05-19 ENCOUNTER — Ambulatory Visit (HOSPITAL_BASED_OUTPATIENT_CLINIC_OR_DEPARTMENT_OTHER): Payer: Medicare Other

## 2014-05-19 ENCOUNTER — Ambulatory Visit: Admission: RE | Admit: 2014-05-19 | Payer: Medicare Other | Source: Ambulatory Visit

## 2014-05-19 VITALS — BP 123/68 | HR 80 | Temp 98.2°F | Resp 18

## 2014-05-19 DIAGNOSIS — C01 Malignant neoplasm of base of tongue: Secondary | ICD-10-CM | POA: Diagnosis not present

## 2014-05-19 DIAGNOSIS — E860 Dehydration: Secondary | ICD-10-CM

## 2014-05-19 MED ORDER — PROMETHAZINE HCL 25 MG/ML IJ SOLN
25.0000 mg | Freq: Once | INTRAMUSCULAR | Status: AC
  Administered 2014-05-19: 12.5 mg via INTRAVENOUS
  Filled 2014-05-19: qty 1

## 2014-05-19 MED ORDER — HEPARIN SOD (PORK) LOCK FLUSH 100 UNIT/ML IV SOLN
250.0000 [IU] | Freq: Once | INTRAVENOUS | Status: DC | PRN
  Filled 2014-05-19: qty 5

## 2014-05-19 MED ORDER — SODIUM CHLORIDE 0.9 % IJ SOLN
10.0000 mL | INTRAMUSCULAR | Status: DC | PRN
  Administered 2014-05-19: 10 mL
  Filled 2014-05-19: qty 10

## 2014-05-19 MED ORDER — HEPARIN SOD (PORK) LOCK FLUSH 100 UNIT/ML IV SOLN
500.0000 [IU] | INTRAVENOUS | Status: AC | PRN
  Administered 2014-05-19: 500 [IU]
  Filled 2014-05-19: qty 5

## 2014-05-19 MED ORDER — SODIUM CHLORIDE 0.9 % IV SOLN
Freq: Once | INTRAVENOUS | Status: AC
  Administered 2014-05-19: 15:00:00 via INTRAVENOUS

## 2014-05-19 NOTE — Patient Instructions (Signed)
Dehydration, Adult Dehydration is when you lose more fluids from the body than you take in. Vital organs like the kidneys, brain, and heart cannot function without a proper amount of fluids and salt. Any loss of fluids from the body can cause dehydration.  CAUSES   Vomiting.  Diarrhea.  Excessive sweating.  Excessive urine output.  Fever. SYMPTOMS  Mild dehydration  Thirst.  Dry lips.  Slightly dry mouth. Moderate dehydration  Very dry mouth.  Sunken eyes.  Skin does not bounce back quickly when lightly pinched and released.  Dark urine and decreased urine production.  Decreased tear production.  Headache. Severe dehydration  Very dry mouth.  Extreme thirst.  Rapid, weak pulse (more than 100 beats per minute at rest).  Cold hands and feet.  Not able to sweat in spite of heat and temperature.  Rapid breathing.  Blue lips.  Confusion and lethargy.  Difficulty being awakened.  Minimal urine production.  No tears. DIAGNOSIS  Your caregiver will diagnose dehydration based on your symptoms and your exam. Blood and urine tests will help confirm the diagnosis. The diagnostic evaluation should also identify the cause of dehydration. TREATMENT  Treatment of mild or moderate dehydration can often be done at home by increasing the amount of fluids that you drink. It is best to drink small amounts of fluid more often. Drinking too much at one time can make vomiting worse. Refer to the home care instructions below. Severe dehydration needs to be treated at the hospital where you will probably be given intravenous (IV) fluids that contain water and electrolytes. HOME CARE INSTRUCTIONS   Ask your caregiver about specific rehydration instructions.  Drink enough fluids to keep your urine clear or pale yellow.  Drink small amounts frequently if you have nausea and vomiting.  Eat as you normally do.  Avoid:  Foods or drinks high in sugar.  Carbonated  drinks.  Juice.  Extremely hot or cold fluids.  Drinks with caffeine.  Fatty, greasy foods.  Alcohol.  Tobacco.  Overeating.  Gelatin desserts.  Wash your hands well to avoid spreading bacteria and viruses.  Only take over-the-counter or prescription medicines for pain, discomfort, or fever as directed by your caregiver.  Ask your caregiver if you should continue all prescribed and over-the-counter medicines.  Keep all follow-up appointments with your caregiver. SEEK MEDICAL CARE IF:  You have abdominal pain and it increases or stays in one area (localizes).  You have a rash, stiff neck, or severe headache.  You are irritable, sleepy, or difficult to awaken.  You are weak, dizzy, or extremely thirsty. SEEK IMMEDIATE MEDICAL CARE IF:   You are unable to keep fluids down or you get worse despite treatment.  You have frequent episodes of vomiting or diarrhea.  You have blood or green matter (bile) in your vomit.  You have blood in your stool or your stool looks black and tarry.  You have not urinated in 6 to 8 hours, or you have only urinated a small amount of very dark urine.  You have a fever.  You faint. MAKE SURE YOU:   Understand these instructions.  Will watch your condition.  Will get help right away if you are not doing well or get worse. Document Released: 12/18/2004 Document Revised: 03/12/2011 Document Reviewed: 08/07/2010 ExitCare Patient Information 2015 [employer]. This information is not intended to replace advice given to you by your health care provider. Make sure you discuss any questions you have with your health care   provider.  

## 2014-05-20 ENCOUNTER — Telehealth: Payer: Self-pay | Admitting: *Deleted

## 2014-05-20 ENCOUNTER — Other Ambulatory Visit: Payer: Self-pay | Admitting: Hematology and Oncology

## 2014-05-20 ENCOUNTER — Ambulatory Visit (HOSPITAL_BASED_OUTPATIENT_CLINIC_OR_DEPARTMENT_OTHER): Payer: Medicare Other | Admitting: Hematology and Oncology

## 2014-05-20 ENCOUNTER — Ambulatory Visit: Payer: Medicare Other | Admitting: Nutrition

## 2014-05-20 ENCOUNTER — Telehealth: Payer: Self-pay | Admitting: Hematology and Oncology

## 2014-05-20 ENCOUNTER — Ambulatory Visit (HOSPITAL_BASED_OUTPATIENT_CLINIC_OR_DEPARTMENT_OTHER): Payer: Medicare Other

## 2014-05-20 ENCOUNTER — Other Ambulatory Visit (HOSPITAL_BASED_OUTPATIENT_CLINIC_OR_DEPARTMENT_OTHER): Payer: Medicare Other

## 2014-05-20 ENCOUNTER — Encounter: Payer: Self-pay | Admitting: *Deleted

## 2014-05-20 ENCOUNTER — Ambulatory Visit
Admission: RE | Admit: 2014-05-20 | Discharge: 2014-05-20 | Disposition: A | Discharge status: Home / Self Care | Payer: Medicare Other | Source: Ambulatory Visit | Attending: Radiation Oncology | Admitting: Radiation Oncology

## 2014-05-20 VITALS — BP 134/66 | HR 91 | Temp 99.1°F | Resp 18

## 2014-05-20 DIAGNOSIS — D630 Anemia in neoplastic disease: Secondary | ICD-10-CM | POA: Diagnosis not present

## 2014-05-20 DIAGNOSIS — K1233 Oral mucositis (ulcerative) due to radiation: Secondary | ICD-10-CM | POA: Diagnosis not present

## 2014-05-20 DIAGNOSIS — Z510 Encounter for antineoplastic radiation therapy: Secondary | ICD-10-CM | POA: Diagnosis not present

## 2014-05-20 DIAGNOSIS — E46 Unspecified protein-calorie malnutrition: Secondary | ICD-10-CM

## 2014-05-20 DIAGNOSIS — L598 Other specified disorders of the skin and subcutaneous tissue related to radiation: Secondary | ICD-10-CM | POA: Diagnosis not present

## 2014-05-20 DIAGNOSIS — D701 Agranulocytosis secondary to cancer chemotherapy: Secondary | ICD-10-CM | POA: Diagnosis not present

## 2014-05-20 DIAGNOSIS — F19921 Other psychoactive substance use, unspecified with intoxication with delirium: Secondary | ICD-10-CM

## 2014-05-20 DIAGNOSIS — E860 Dehydration: Secondary | ICD-10-CM

## 2014-05-20 DIAGNOSIS — C01 Malignant neoplasm of base of tongue: Secondary | ICD-10-CM

## 2014-05-20 DIAGNOSIS — T50905A Adverse effect of unspecified drugs, medicaments and biological substances, initial encounter: Secondary | ICD-10-CM | POA: Insufficient documentation

## 2014-05-20 DIAGNOSIS — B370 Candidal stomatitis: Secondary | ICD-10-CM | POA: Diagnosis not present

## 2014-05-20 DIAGNOSIS — R410 Disorientation, unspecified: Secondary | ICD-10-CM

## 2014-05-20 DIAGNOSIS — T451X5A Adverse effect of antineoplastic and immunosuppressive drugs, initial encounter: Secondary | ICD-10-CM

## 2014-05-20 DIAGNOSIS — F411 Generalized anxiety disorder: Secondary | ICD-10-CM | POA: Diagnosis not present

## 2014-05-20 LAB — COMPREHENSIVE METABOLIC PANEL (CC13)
ALT: 31 U/L (ref 0–55)
AST: 25 U/L (ref 5–34)
Albumin: 2.7 g/dL — ABNORMAL LOW (ref 3.5–5.0)
Alkaline Phosphatase: 93 U/L (ref 40–150)
Anion Gap: 9 meq/L (ref 3–11)
BUN: 26.8 mg/dL — ABNORMAL HIGH (ref 7.0–26.0)
CO2: 24 meq/L (ref 22–29)
Calcium: 8.8 mg/dL (ref 8.4–10.4)
Chloride: 101 meq/L (ref 98–109)
Creatinine: 0.9 mg/dL (ref 0.7–1.3)
EGFR: 82 ml/min/1.73 m2 — ABNORMAL LOW
Glucose: 110 mg/dL (ref 70–140)
Potassium: 4.8 meq/L (ref 3.5–5.1)
Sodium: 134 meq/L — ABNORMAL LOW (ref 136–145)
Total Bilirubin: 0.42 mg/dL (ref 0.20–1.20)
Total Protein: 6.5 g/dL (ref 6.4–8.3)

## 2014-05-20 LAB — CBC WITH DIFFERENTIAL/PLATELET
BASO%: 0 % (ref 0.0–2.0)
BASOS ABS: 0 10*3/uL (ref 0.0–0.1)
EOS%: 0.6 % (ref 0.0–7.0)
Eosinophils Absolute: 0 10*3/uL (ref 0.0–0.5)
HEMATOCRIT: 28.8 % — AB (ref 38.4–49.9)
HEMOGLOBIN: 9.8 g/dL — AB (ref 13.0–17.1)
LYMPH#: 0.3 10*3/uL — AB (ref 0.9–3.3)
LYMPH%: 8.8 % — ABNORMAL LOW (ref 14.0–49.0)
MCH: 29.6 pg (ref 27.2–33.4)
MCHC: 34 g/dL (ref 32.0–36.0)
MCV: 87 fL (ref 79.3–98.0)
MONO#: 0.4 10*3/uL (ref 0.1–0.9)
MONO%: 11.2 % (ref 0.0–14.0)
NEUT%: 79.4 % — AB (ref 39.0–75.0)
NEUTROS ABS: 2.6 10*3/uL (ref 1.5–6.5)
Platelets: 298 10*3/uL (ref 140–400)
RBC: 3.31 10*6/uL — ABNORMAL LOW (ref 4.20–5.82)
RDW: 13 % (ref 11.0–14.6)
WBC: 3.3 10*3/uL — ABNORMAL LOW (ref 4.0–10.3)

## 2014-05-20 LAB — MAGNESIUM (CC13): Magnesium: 2 mg/dL (ref 1.5–2.5)

## 2014-05-20 MED ORDER — OSMOLITE 1.5 CAL PO LIQD: ORAL | Status: DC

## 2014-05-20 MED ORDER — SODIUM CHLORIDE 0.9 % IV SOLN
1000.0000 mL | Freq: Once | INTRAVENOUS | Status: AC
  Administered 2014-05-20: 1000 mL via INTRAVENOUS

## 2014-05-20 NOTE — Assessment & Plan Note (Signed)
The patient is starting to experience significant side effects from treatment. He has side effects expected from treatment I will continue supportive care for now

## 2014-05-20 NOTE — Assessment & Plan Note (Signed)
This is related to side effects of treatment. He is not symptomatic with infection. Continue close observation.

## 2014-05-20 NOTE — Telephone Encounter (Addendum)
Oncology Nurse Navigator Documentation  Oncology Nurse Navigator Flowsheets 05/18/2014 05/20/2014  Navigator Encounter Type Telephone Telephone  Barriers/Navigation Needs No barriers at this time -  Interventions None required -  Time Spent with Patient 15 15    Patient wife called: Inquired about added RT date on 05/25/14.  I explained that he was unable to complete tmt yesterday .  She had not received this information from her son who brought patient. She additionally reported patient:  Was up hourly last HS d/t increasing confusion which started over the past several days.  Is having intermittent and brief episodes of uncontrollable shakes.  She stated is afebrile.  Has fallen at home several times over past days, is forgetting to use his cane.   Pain is being managed with 25 mcg Fentanyl and PRN 0.5 mL morphine sulfate 2-3 times daily. We discussed patient taking 0.5 mg Ativan prior to tmt this afternoon to ease anxiety.  Wife agreed. I informed Dr. Alvy Bimler.  Gayleen Orem, RN, BSN, [place] at [place] [phone]

## 2014-05-20 NOTE — Telephone Encounter (Signed)
Added appt per staff

## 2014-05-20 NOTE — Assessment & Plan Note (Signed)
This is likely anemia of chronic disease. The patient denies recent history of bleeding such as epistaxis, hematuria or hematochezia. He is asymptomatic from the anemia. We will observe for now.  He does not require transfusion now.   

## 2014-05-20 NOTE — Assessment & Plan Note (Signed)
He will continue aggressive IV fluid hydration.

## 2014-05-20 NOTE — Assessment & Plan Note (Signed)
He has intermittent confusion likely related to morphine sulfate and stress from treatment. His family members are watching him closely. I educated them about getting him awake during daytime and only allow him to sleep at nighttime. I also recommend reducing the dose of morphine if possible. I also recommend his son to get him a urinal by the bedside so that he does not get up in the middle of the night with risk of fall/injury.

## 2014-05-20 NOTE — Progress Notes (Signed)
Follow-up completed with patient and family. Patient is not consuming oral foods by mouth. Patient is tolerating Osmolite 1.5 - 2 cans 3 times a day via PEG.  He is working on increasing to 7 cans a day with a fourth feeding. Patient is flushing feeding tube with 120 cc free water before and after bolus feedings. Weight has decreased and was documented as 181 pounds.  Nutrition diagnosis: Inadequate oral intake continues.  Intervention: Will order [facility] from [facility] so patient has a steady supply of formula. Patient requires 7 cans of Osmolite 1.5 daily to provide 2485 cal, 104 g protein, 1267 mL free water. Patient should flush feeding tube with an additional 250 cc free water daily. Teach back method used.  Monitoring, evaluation, goals: Patient is tolerating tube feedings but has had weight loss.  Next visit: Tuesday, May 24 in the infusion room.  **Disclaimer: This note was dictated with voice recognition software. Similar sounding words can inadvertently be transcribed and this note may contain transcription errors which may not have been corrected upon publication of note.**

## 2014-05-20 NOTE — Assessment & Plan Note (Signed)
He has significant protein calorie malnutrition with low albumin. His son is attempting to give him gravity feeding up to 7 cans at home.

## 2014-05-20 NOTE — Progress Notes (Signed)
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 He is seen urgently today because of intermittent confusion at home and recurrent falls. His son is present. The patient appeared to be somewhat sedated. He continues to have mucositis but his pain appeared to be well controlled with intermittent reduced dose morphine as needed. There were no reported nausea, vomiting or constipation. His son noted that the patient had been asleep a lot during daytime and awake at nighttime. There were no major injuries sustained from recent falls.  REVIEW OF SYSTEMS:   Constitutional: Denies fevers, chills or abnormal weight loss Eyes: Denies blurriness of vision Respiratory: Denies cough, dyspnea or wheezes Cardiovascular: Denies palpitation, chest discomfort or lower extremity swelling Gastrointestinal:  Denies nausea, heartburn or change in bowel habits Skin: Denies abnormal skin  rashes Lymphatics: Denies new lymphadenopathy or easy bruising Neurological:Denies numbness, tingling or new weaknesses Behavioral/Psych: Mood is stable, no new changes  All other systems were reviewed with the patient and are negative.  I have reviewed the past medical history, past surgical history, social history and family history with the patient and they are unchanged from previous note.  ALLERGIES:  has No Known Allergies.  MEDICATIONS:  Current Outpatient Prescriptions  Medication Sig Dispense Refill  . amphetamine-dextroamphetamine (ADDERALL) 20 MG tablet Take 20 mg by mouth 2 (two) times daily.    Marland Kitchen aspirin 81 MG tablet Take 81 mg by mouth daily.    Marland Kitchen atorvastatin (LIPITOR) 10 MG tablet Take 10 mg by mouth daily.    Marland Kitchen emollient (BIAFINE) cream Apply topically as needed.    . fentaNYL (DURAGESIC - DOSED MCG/HR) 25 MCG/HR patch Place 1 patch (25 mcg total) onto the skin every 3 (three) days. 5 patch 0  . fluconazole (DIFLUCAN) 100 MG tablet Take 2 tablets today, then 1 tablet daily x 20 more days for yeast in mouth. 22 tablet 0  . levothyroxine (SYNTHROID, LEVOTHROID) 75 MCG tablet Take 75 mcg by mouth daily before breakfast.     . lidocaine (XYLOCAINE) 2 % solution Patient: Mix 1part 2% viscous lidocaine, 1part H20. Swish and/or swallow 36mL of this mixture, 32min before meals and at bedtime, up to QID 100 mL 5  . lidocaine-prilocaine (EMLA) cream Apply to affected area once 30 g 3  . LORazepam (ATIVAN) 0.5 MG tablet Take 1 tablet approximately 30 min prior to radiotherapy for anxiety/claustrophobia (Patient not taking: Reported on 05/17/2014) 40 tablet 0  . Morphine Sulfate (MORPHINE CONCENTRATE) 10 MG/0.5ML SOLN concentrated solution Take 0.5  mLs (10 mg total) by mouth every 2 (two) hours as needed. 240 mL 0  . Multiple Vitamin (MULTIVITAMIN WITH MINERALS) TABS tablet Take 1 tablet by mouth daily.    . Nutritional Supplements (FEEDING SUPPLEMENT, OSMOLITE 1.5 CAL,) LIQD Give 2 cans  of Osmolite 1.5 via PEG TID and 1 can once daily with 120 cc free water before and after bolus feeding.  Drink or flush feeding tube with an additional 250 cc free water daily. 1659 mL 0  . ondansetron (ZOFRAN) 8 MG tablet Take 1 tablet (8 mg total) by mouth every 8 (eight) hours as needed. 30 tablet 1  . prochlorperazine (COMPAZINE) 10 MG tablet Take 1 tablet (10 mg total) by mouth every 6 (six) hours as needed (Nausea or vomiting). 30 tablet 1  . ranitidine (ZANTAC) 150 MG/10ML syrup Take 10 mLs (150 mg total) by mouth 2 (two) times daily. May flush into PEG tube. To prevent indigestion. (Patient not taking: Reported on 05/17/2014) 300 mL 3  . sodium fluoride (FLUORISHIELD) 1.1 % GEL dental gel Instill one drop of gel per tooth space of fluoride tray. Place over teeth for 5 minutes. Remove. Spit out excess. Repeat nightly. 120 mL prn  . sucralfate (CARAFATE) 1 G tablet Dissolve 1 tablet in 10 mL H20 and swallow QID PRN sore throat. 40 tablet 5  . Wound Dressings (RADIAGEL) GEL Apply topically 2 (two) times daily.     No current facility-administered medications for this visit.    PHYSICAL EXAMINATION: ECOG PERFORMANCE STATUS: 1 - Symptomatic but completely ambulatory GENERAL he appears to be sedated. SKIN: skin color, texture, turgor are normal, no rashes or significant lesions EYES: normal, Conjunctiva are pink and non-injected, sclera clear OROPHARYNX:no exudate, no erythema and lips, buccal mucosa, and tongue normal . Noted mild mucositis but no thrush Musculoskeletal:no cyanosis of digits and no clubbing  NEURO: alert & oriented x 3 with fluent speech, somewhat sedated.  LABORATORY DATA:  I have reviewed the data as listed    Component Value Date/Time   NA 134* 05/20/2014 1447   NA 139 04/02/2014 1230   K 4.8 05/20/2014 1447   K 4.2 04/02/2014 1230   CL 105 04/02/2014 1230   CO2 24 05/20/2014 1447   CO2 25 04/02/2014 1230   GLUCOSE 110 05/20/2014 1447   GLUCOSE 87 04/02/2014 1230    BUN 26.8* 05/20/2014 1447   BUN 21 04/02/2014 1230   CREATININE 0.9 05/20/2014 1447   CREATININE 0.85 04/02/2014 1230   CALCIUM 8.8 05/20/2014 1447   CALCIUM 9.6 04/02/2014 1230   PROT 6.5 05/20/2014 1447   PROT 7.0 12/30/2012 0545   ALBUMIN 2.7* 05/20/2014 1447   ALBUMIN 3.7 12/30/2012 0545   AST 25 05/20/2014 1447   AST 27 12/30/2012 0545   ALT 31 05/20/2014 1447   ALT 31 12/30/2012 0545   ALKPHOS 93 05/20/2014 1447   ALKPHOS 86 12/30/2012 0545   BILITOT 0.42 05/20/2014 1447   BILITOT 0.7 12/30/2012 0545   GFRNONAA 85* 04/02/2014 1230   GFRAA >90 04/02/2014 1230    No results found for: SPEP, UPEP  Lab Results  Component Value Date   WBC 3.3* 05/20/2014   NEUTROABS 2.6 05/20/2014   HGB 9.8* 05/20/2014   HCT 28.8* 05/20/2014   MCV 87.0 05/20/2014   PLT 298 05/20/2014      Chemistry      Component Value Date/Time   NA 134* 05/20/2014 1447   NA 139 04/02/2014 1230   K 4.8 05/20/2014  1447   K 4.2 04/02/2014 1230   CL 105 04/02/2014 1230   CO2 24 05/20/2014 1447   CO2 25 04/02/2014 1230   BUN 26.8* 05/20/2014 1447   BUN 21 04/02/2014 1230   CREATININE 0.9 05/20/2014 1447   CREATININE 0.85 04/02/2014 1230      Component Value Date/Time   CALCIUM 8.8 05/20/2014 1447   CALCIUM 9.6 04/02/2014 1230   ALKPHOS 93 05/20/2014 1447   ALKPHOS 86 12/30/2012 0545   AST 25 05/20/2014 1447   AST 27 12/30/2012 0545   ALT 31 05/20/2014 1447   ALT 31 12/30/2012 0545   BILITOT 0.42 05/20/2014 1447   BILITOT 0.7 12/30/2012 0545      ASSESSMENT & PLAN:  Malignant neoplasm of base of tongue The patient is starting to experience significant side effects from treatment. He has side effects expected from treatment I will continue supportive care for now   Leukopenia due to antineoplastic chemotherapy This is related to side effects of treatment. He is not symptomatic with infection. Continue close observation.   Anemia in neoplastic disease This is likely anemia of  chronic disease. The patient denies recent history of bleeding such as epistaxis, hematuria or hematochezia. He is asymptomatic from the anemia. We will observe for now.  He does not require transfusion now.     Dehydration He will continue aggressive IV fluid hydration.    Confusion caused by a drug He has intermittent confusion likely related to morphine sulfate and stress from treatment. His family members are watching him closely. I educated them about getting him awake during daytime and only allow him to sleep at nighttime. I also recommend reducing the dose of morphine if possible. I also recommend his son to get him a urinal by the bedside so that he does not get up in the middle of the night with risk of fall/injury.   Protein calorie malnutrition He has significant protein calorie malnutrition with low albumin. His son is attempting to give him gravity feeding up to 7 cans at home.    No orders of the defined types were placed in this encounter.   All questions were answered. The patient knows to call the clinic with any problems, questions or concerns. No barriers to learning was detected. I spent 25 minutes counseling the patient face to face. The total time spent in the appointment was 30 minutes and more than 50% was on counseling and review of test results     [facility], [place], MD 05/20/2014 4:18 PM

## 2014-05-20 NOTE — Patient Instructions (Signed)
Dehydration, Adult Dehydration is when you lose more fluids from the body than you take in. Vital organs like the kidneys, brain, and heart cannot function without a proper amount of fluids and salt. Any loss of fluids from the body can cause dehydration.  CAUSES   Vomiting.  Diarrhea.  Excessive sweating.  Excessive urine output.  Fever. SYMPTOMS  Mild dehydration  Thirst.  Dry lips.  Slightly dry mouth. Moderate dehydration  Very dry mouth.  Sunken eyes.  Skin does not bounce back quickly when lightly pinched and released.  Dark urine and decreased urine production.  Decreased tear production.  Headache. Severe dehydration  Very dry mouth.  Extreme thirst.  Rapid, weak pulse (more than 100 beats per minute at rest).  Cold hands and feet.  Not able to sweat in spite of heat and temperature.  Rapid breathing.  Blue lips.  Confusion and lethargy.  Difficulty being awakened.  Minimal urine production.  No tears. DIAGNOSIS  Your caregiver will diagnose dehydration based on your symptoms and your exam. Blood and urine tests will help confirm the diagnosis. The diagnostic evaluation should also identify the cause of dehydration. TREATMENT  Treatment of mild or moderate dehydration can often be done at home by increasing the amount of fluids that you drink. It is best to drink small amounts of fluid more often. Drinking too much at one time can make vomiting worse. Refer to the home care instructions below. Severe dehydration needs to be treated at the hospital where you will probably be given intravenous (IV) fluids that contain water and electrolytes. HOME CARE INSTRUCTIONS   Ask your caregiver about specific rehydration instructions.  Drink enough fluids to keep your urine clear or pale yellow.  Drink small amounts frequently if you have nausea and vomiting.  Eat as you normally do.  Avoid:  Foods or drinks high in sugar.  Carbonated  drinks.  Juice.  Extremely hot or cold fluids.  Drinks with caffeine.  Fatty, greasy foods.  Alcohol.  Tobacco.  Overeating.  Gelatin desserts.  Wash your hands well to avoid spreading bacteria and viruses.  Only take over-the-counter or prescription medicines for pain, discomfort, or fever as directed by your caregiver.  Ask your caregiver if you should continue all prescribed and over-the-counter medicines.  Keep all follow-up appointments with your caregiver. SEEK MEDICAL CARE IF:  You have abdominal pain and it increases or stays in one area (localizes).  You have a rash, stiff neck, or severe headache.  You are irritable, sleepy, or difficult to awaken.  You are weak, dizzy, or extremely thirsty. SEEK IMMEDIATE MEDICAL CARE IF:   You are unable to keep fluids down or you get worse despite treatment.  You have frequent episodes of vomiting or diarrhea.  You have blood or green matter (bile) in your vomit.  You have blood in your stool or your stool looks black and tarry.  You have not urinated in 6 to 8 hours, or you have only urinated a small amount of very dark urine.  You have a fever.  You faint. MAKE SURE YOU:   Understand these instructions.  Will watch your condition.  Will get help right away if you are not doing well or get worse. Document Released: 12/18/2004 Document Revised: 03/12/2011 Document Reviewed: 08/07/2010 ExitCare Patient Information 2015 [employer]. This information is not intended to replace advice given to you by your health care provider. Make sure you discuss any questions you have with your health care   provider.  

## 2014-05-21 ENCOUNTER — Encounter: Payer: Self-pay | Admitting: *Deleted

## 2014-05-21 ENCOUNTER — Ambulatory Visit (HOSPITAL_BASED_OUTPATIENT_CLINIC_OR_DEPARTMENT_OTHER): Payer: Medicare Other

## 2014-05-21 ENCOUNTER — Other Ambulatory Visit: Payer: Self-pay | Admitting: *Deleted

## 2014-05-21 ENCOUNTER — Ambulatory Visit
Admission: RE | Admit: 2014-05-21 | Discharge: 2014-05-21 | Disposition: A | Discharge status: Home / Self Care | Payer: Medicare Other | Source: Ambulatory Visit | Attending: Radiation Oncology | Admitting: Radiation Oncology

## 2014-05-21 VITALS — BP 120/49 | HR 83 | Temp 99.4°F | Resp 16

## 2014-05-21 DIAGNOSIS — C01 Malignant neoplasm of base of tongue: Secondary | ICD-10-CM

## 2014-05-21 DIAGNOSIS — F411 Generalized anxiety disorder: Secondary | ICD-10-CM | POA: Diagnosis not present

## 2014-05-21 DIAGNOSIS — B370 Candidal stomatitis: Secondary | ICD-10-CM | POA: Diagnosis not present

## 2014-05-21 DIAGNOSIS — L598 Other specified disorders of the skin and subcutaneous tissue related to radiation: Secondary | ICD-10-CM | POA: Diagnosis not present

## 2014-05-21 DIAGNOSIS — K1233 Oral mucositis (ulcerative) due to radiation: Secondary | ICD-10-CM | POA: Diagnosis not present

## 2014-05-21 DIAGNOSIS — E860 Dehydration: Secondary | ICD-10-CM

## 2014-05-21 DIAGNOSIS — Z510 Encounter for antineoplastic radiation therapy: Secondary | ICD-10-CM | POA: Diagnosis not present

## 2014-05-21 MED ORDER — SODIUM CHLORIDE 0.9 % IV SOLN
Freq: Once | INTRAVENOUS | Status: AC
  Administered 2014-05-21: 15:00:00 via INTRAVENOUS

## 2014-05-21 MED ORDER — HEPARIN SOD (PORK) LOCK FLUSH 100 UNIT/ML IV SOLN
500.0000 [IU] | Freq: Once | INTRAVENOUS | Status: AC | PRN
  Administered 2014-05-21: 500 [IU]
  Filled 2014-05-21: qty 5

## 2014-05-21 MED ORDER — SODIUM CHLORIDE 0.9 % IJ SOLN
10.0000 mL | INTRAMUSCULAR | Status: DC | PRN
  Administered 2014-05-21: 10 mL
  Filled 2014-05-21: qty 10

## 2014-05-21 NOTE — Progress Notes (Signed)
Oncology Nurse Navigator Documentation  Oncology Nurse Navigator Flowsheets 05/18/2014 05/20/2014 05/20/2014 05/21/2014  Navigator Encounter Type Telephone Telephone Treatment Treatment  Patient Visit Type - - Radonc Radonc  Treatment Phase - - Treatment Treatment  - To provide support and encouragement, care continuity, met with patient during [name].  He was accompanied by his son.  Patient tolerated tmt without difficulty following procedure implemented yesterday - Ativan 30 mins prior to tmt, modification to securing mask.     Barriers/Navigation Needs No barriers at this time - No barriers at this time;Education -  Education - - Pain/ Symptom Management -  Interventions None required - - -  Time Spent with Patient 15 15 60 30    Gayleen Orem, Therapist, sports, Copywriter, advertising, [place] at [place] [phone]

## 2014-05-21 NOTE — Telephone Encounter (Signed)
Pt scheduled in [facility] for IVFs on 5/31 d/t no room available in our clinic.   Will notify pt.. Orders placed under signed and held.

## 2014-05-21 NOTE — Patient Instructions (Signed)
Dehydration, Adult Dehydration is when you lose more fluids from the body than you take in. Vital organs like the kidneys, brain, and heart cannot function without a proper amount of fluids and salt. Any loss of fluids from the body can cause dehydration.  CAUSES   Vomiting.  Diarrhea.  Excessive sweating.  Excessive urine output.  Fever. SYMPTOMS  Mild dehydration  Thirst.  Dry lips.  Slightly dry mouth. Moderate dehydration  Very dry mouth.  Sunken eyes.  Skin does not bounce back quickly when lightly pinched and released.  Dark urine and decreased urine production.  Decreased tear production.  Headache. Severe dehydration  Very dry mouth.  Extreme thirst.  Rapid, weak pulse (more than 100 beats per minute at rest).  Cold hands and feet.  Not able to sweat in spite of heat and temperature.  Rapid breathing.  Blue lips.  Confusion and lethargy.  Difficulty being awakened.  Minimal urine production.  No tears. DIAGNOSIS  Your caregiver will diagnose dehydration based on your symptoms and your exam. Blood and urine tests will help confirm the diagnosis. The diagnostic evaluation should also identify the cause of dehydration. TREATMENT  Treatment of mild or moderate dehydration can often be done at home by increasing the amount of fluids that you drink. It is best to drink small amounts of fluid more often. Drinking too much at one time can make vomiting worse. Refer to the home care instructions below. Severe dehydration needs to be treated at the hospital where you will probably be given intravenous (IV) fluids that contain water and electrolytes. HOME CARE INSTRUCTIONS   Ask your caregiver about specific rehydration instructions.  Drink enough fluids to keep your urine clear or pale yellow.  Drink small amounts frequently if you have nausea and vomiting.  Eat as you normally do.  Avoid:  Foods or drinks high in sugar.  Carbonated  drinks.  Juice.  Extremely hot or cold fluids.  Drinks with caffeine.  Fatty, greasy foods.  Alcohol.  Tobacco.  Overeating.  Gelatin desserts.  Wash your hands well to avoid spreading bacteria and viruses.  Only take over-the-counter or prescription medicines for pain, discomfort, or fever as directed by your caregiver.  Ask your caregiver if you should continue all prescribed and over-the-counter medicines.  Keep all follow-up appointments with your caregiver. SEEK MEDICAL CARE IF:  You have abdominal pain and it increases or stays in one area (localizes).  You have a rash, stiff neck, or severe headache.  You are irritable, sleepy, or difficult to awaken.  You are weak, dizzy, or extremely thirsty. SEEK IMMEDIATE MEDICAL CARE IF:   You are unable to keep fluids down or you get worse despite treatment.  You have frequent episodes of vomiting or diarrhea.  You have blood or green matter (bile) in your vomit.  You have blood in your stool or your stool looks black and tarry.  You have not urinated in 6 to 8 hours, or you have only urinated a small amount of very dark urine.  You have a fever.  You faint. MAKE SURE YOU:   Understand these instructions.  Will watch your condition.  Will get help right away if you are not doing well or get worse. Document Released: 12/18/2004 Document Revised: 03/12/2011 Document Reviewed: 08/07/2010 ExitCare Patient Information 2015 [employer]. This information is not intended to replace advice given to you by your health care provider. Make sure you discuss any questions you have with your health care   provider.  

## 2014-05-21 NOTE — Progress Notes (Signed)
Oncology Nurse Navigator Documentation  Oncology Nurse Navigator Flowsheets 05/18/2014 05/20/2014 05/20/2014  Navigator Encounter Type Telephone Telephone Treatment  Patient Visit Type - - Radonc  Treatment Phase - - Treatment  Barriers/Navigation Needs No barriers at this time - No barriers at this time;Education  Education - - Pain/ Symptom Management  Interventions None required - -  Time Spent with Patient 15 15 60    To provide support and encouragement, care continuity and to assess for needs, met with patient during scheduled [place].  His son accompanied him. Patient expressed concern about bilateral neck swelling which caused too much discomfort for him to complete tmt yesterday. His son acknowledged that he had taken Ativan about 30 mins ago. Therapists were able to avoid mask snap at bilateral neck which minimized pressure in area of swelling but not at expense of alignment/security for tmt. Patient completed tmt without incident, stated he did not experience yesterday's difficulty.  I acknowledged his success, we discussed the adjustments made for today's tmt, recognized the value of continuation for the remaining 3 tmts. Son noted BID application of Biafine continues.  Gayleen Orem, RN, BSN, [place] at [place] [phone]

## 2014-05-24 ENCOUNTER — Other Ambulatory Visit: Payer: Self-pay | Admitting: *Deleted

## 2014-05-24 ENCOUNTER — Telehealth: Payer: Self-pay | Admitting: *Deleted

## 2014-05-24 ENCOUNTER — Ambulatory Visit
Admission: RE | Admit: 2014-05-24 | Discharge: 2014-05-24 | Disposition: A | Discharge status: Home / Self Care | Payer: Medicare Other | Source: Ambulatory Visit | Attending: Radiation Oncology | Admitting: Radiation Oncology

## 2014-05-24 ENCOUNTER — Encounter: Payer: Self-pay | Admitting: Radiation Oncology

## 2014-05-24 ENCOUNTER — Encounter: Payer: Self-pay | Admitting: *Deleted

## 2014-05-24 ENCOUNTER — Other Ambulatory Visit: Payer: Self-pay | Admitting: Medical Oncology

## 2014-05-24 ENCOUNTER — Other Ambulatory Visit: Payer: Self-pay | Admitting: Radiation Oncology

## 2014-05-24 ENCOUNTER — Ambulatory Visit (HOSPITAL_BASED_OUTPATIENT_CLINIC_OR_DEPARTMENT_OTHER): Payer: Medicare Other

## 2014-05-24 ENCOUNTER — Ambulatory Visit: Payer: Medicare Other

## 2014-05-24 VITALS — BP 131/54 | HR 79 | Temp 98.9°F | Resp 20 | Wt 171.4 lb

## 2014-05-24 DIAGNOSIS — R4182 Altered mental status, unspecified: Secondary | ICD-10-CM

## 2014-05-24 DIAGNOSIS — Z5111 Encounter for antineoplastic chemotherapy: Secondary | ICD-10-CM

## 2014-05-24 DIAGNOSIS — C01 Malignant neoplasm of base of tongue: Secondary | ICD-10-CM | POA: Diagnosis not present

## 2014-05-24 DIAGNOSIS — R443 Hallucinations, unspecified: Secondary | ICD-10-CM

## 2014-05-24 DIAGNOSIS — R410 Disorientation, unspecified: Secondary | ICD-10-CM

## 2014-05-24 DIAGNOSIS — E46 Unspecified protein-calorie malnutrition: Secondary | ICD-10-CM | POA: Diagnosis present

## 2014-05-24 DIAGNOSIS — T50905A Adverse effect of unspecified drugs, medicaments and biological substances, initial encounter: Secondary | ICD-10-CM

## 2014-05-24 DIAGNOSIS — E860 Dehydration: Secondary | ICD-10-CM

## 2014-05-24 DIAGNOSIS — F19921 Other psychoactive substance use, unspecified with intoxication with delirium: Principal | ICD-10-CM

## 2014-05-24 DIAGNOSIS — R440 Auditory hallucinations: Secondary | ICD-10-CM | POA: Diagnosis not present

## 2014-05-24 LAB — CBC WITH DIFFERENTIAL/PLATELET
BASO%: 0 % (ref 0.0–2.0)
BASOS ABS: 0 10*3/uL (ref 0.0–0.1)
EOS ABS: 0 10*3/uL (ref 0.0–0.5)
EOS%: 2.1 % (ref 0.0–7.0)
HCT: 29.7 % — ABNORMAL LOW (ref 38.4–49.9)
HGB: 10.1 g/dL — ABNORMAL LOW (ref 13.0–17.1)
LYMPH#: 0.3 10*3/uL — AB (ref 0.9–3.3)
LYMPH%: 17.2 % (ref 14.0–49.0)
MCH: 29.4 pg (ref 27.2–33.4)
MCHC: 34 g/dL (ref 32.0–36.0)
MCV: 86.6 fL (ref 79.3–98.0)
MONO#: 0.6 10*3/uL (ref 0.1–0.9)
MONO%: 29.2 % — ABNORMAL HIGH (ref 0.0–14.0)
NEUT%: 51.5 % (ref 39.0–75.0)
NEUTROS ABS: 1 10*3/uL — AB (ref 1.5–6.5)
NRBC: 0 % (ref 0–0)
Platelets: 368 10*3/uL (ref 140–400)
RBC: 3.43 10*6/uL — ABNORMAL LOW (ref 4.20–5.82)
RDW: 13.2 % (ref 11.0–14.6)
WBC: 1.9 10*3/uL — AB (ref 4.0–10.3)

## 2014-05-24 LAB — URINALYSIS, MICROSCOPIC - CHCC
Bilirubin (Urine): NEGATIVE
Blood: NEGATIVE
GLUCOSE UR CHCC: NEGATIVE mg/dL
KETONES: NEGATIVE mg/dL
LEUKOCYTE ESTERASE: NEGATIVE
Nitrite: NEGATIVE
PROTEIN: NEGATIVE mg/dL
Specific Gravity, Urine: 1.015 (ref 1.003–1.035)
Urobilinogen, UR: 0.2 mg/dL (ref 0.2–1)
pH: 6 (ref 4.6–8.0)

## 2014-05-24 LAB — COMPREHENSIVE METABOLIC PANEL (CC13)
ALK PHOS: 95 U/L (ref 40–150)
ALT: 38 U/L (ref 0–55)
ANION GAP: 11 meq/L (ref 3–11)
AST: 27 U/L (ref 5–34)
Albumin: 2.9 g/dL — ABNORMAL LOW (ref 3.5–5.0)
BILIRUBIN TOTAL: 0.33 mg/dL (ref 0.20–1.20)
BUN: 25.9 mg/dL (ref 7.0–26.0)
CHLORIDE: 100 meq/L (ref 98–109)
CO2: 24 meq/L (ref 22–29)
Calcium: 8.9 mg/dL (ref 8.4–10.4)
Creatinine: 0.9 mg/dL (ref 0.7–1.3)
EGFR: 82 mL/min/{1.73_m2} — ABNORMAL LOW (ref >90)
GLUCOSE: 122 mg/dL (ref 70–140)
POTASSIUM: 4.8 meq/L (ref 3.5–5.1)
Sodium: 136 mEq/L (ref 136–145)
Total Protein: 6.8 g/dL (ref 6.4–8.3)

## 2014-05-24 LAB — TECHNOLOGIST REVIEW

## 2014-05-24 MED ORDER — HEPARIN SOD (PORK) LOCK FLUSH 100 UNIT/ML IV SOLN
500.0000 [IU] | Freq: Once | INTRAVENOUS | Status: AC
  Administered 2014-05-24: 500 [IU] via INTRAVENOUS
  Filled 2014-05-24: qty 5

## 2014-05-24 MED ORDER — SODIUM CHLORIDE 0.9 % IJ SOLN
10.0000 mL | INTRAMUSCULAR | Status: DC | PRN
  Administered 2014-05-24: 10 mL via INTRAVENOUS
  Filled 2014-05-24: qty 10

## 2014-05-24 MED ORDER — THIAMINE HCL 100 MG/ML IJ SOLN
Freq: Once | INTRAVENOUS | Status: AC
  Administered 2014-05-24: 17:00:00 via INTRAVENOUS
  Filled 2014-05-24: qty 500

## 2014-05-24 MED ORDER — SODIUM CHLORIDE 0.9 % IV SOLN
Freq: Once | INTRAVENOUS | Status: AC
  Administered 2014-05-24: 17:00:00 via INTRAVENOUS

## 2014-05-24 MED ORDER — VITAMIN B-1 100 MG PO TABS: ORAL_TABLET | ORAL | Status: DC

## 2014-05-24 NOTE — Telephone Encounter (Signed)
Oncology Nurse Navigator Documentation  Oncology Nurse Navigator Flowsheets 05/18/2014 05/20/2014 05/20/2014 05/21/2014 05/24/2014  Navigator Encounter Type Telephone Telephone Treatment Treatment Telephone -  Patient's wife called for clarification of this week's appts.  I indicated I will print Epic calendar and provide to patient son when I see him today. She reported Dylan Moreno is -  having increasing episodes of hallucination  walking around at [facility] when others are sleeping (took car keys last night with the apparent intent of driving somewhere, took her cell phone)  having periods of paranoia and belligerency when he thinks others are talking about him.     sleeping more during the day  receiving prn morphine about 3 times daily I encouraged her to have her son provide recap during undertreat appt with Dr. Isidore Moos, I indicated I would inform Dr. Isidore Moos of our conversation.    Patient Visit Type - - Radonc Radonc -  Treatment Phase - - Treatment Treatment -  Barriers/Navigation Needs No barriers at this time - No barriers at this time;Education - -  Education - - Pain/ Symptom Management - -  Interventions None required - - - -  Time Spent with Patient 15 15 60 30 15    Gayleen Orem, Therapist, sports, Copywriter, advertising, [place] at [place] [phone]

## 2014-05-24 NOTE — Progress Notes (Signed)
Weekly Management Note:   ICD-9-CM ICD-10-CM   1. Malignant neoplasm of base of tongue 141.0 C01 Vitamin B1     CBC with Differential     Comprehensive metabolic panel     thiamine (VITAMIN B-1) 100 MG tablet  2. Hallucinations 780.1 R44.3 Vitamin B1     CBC with Differential     Comprehensive metabolic panel     thiamine (VITAMIN B-1) 100 MG tablet    Site: Base of tongue/bilateral neck Current Dose:  68Gy Projected Dose: 70 Gy  Narrative: The patient is seen today for routine under treatment assessment. CBCT/MVCT images/port films were reviewed. The chart was reviewed.  It hurts to swallow water, osmolite 1.5-2 cans 3x day via peg, with free water  flush before and after feedings, working on 7 cans for 4th feeding, in w/c, will get labs after MD sees him and before he gets fluids today stated.  Mental status changes x 2 weeks, with hallucinations and paranoia. No ETOH for several weeks after history of heavy drinking. Afebrile   Physical Examination:  Filed Vitals:   05/24/14 1435  BP: 131/54  Pulse: 79  Temp: 98.9 F (37.2 C)  Resp: 20  .  Weight: 171 lb 6.4 oz (77.747 kg).  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 Appreciate palpable left neck adenopathy.  Oropharynx- confluent mucositis /dry mucosa, white mucositis over oral tongue, no obvious thrush. LUNGS CTAB  Laboratory data: Lab Results  Component Value Date   WBC 1.9* 05/24/2014   HGB 10.1* 05/24/2014   HCT 29.7* 05/24/2014   MCV 86.6 05/24/2014   PLT 368 05/24/2014   CMP     Component Value Date/Time   NA 136 05/24/2014 1541   NA 139 04/02/2014 1230   K 4.8 05/24/2014 1541   K 4.2 04/02/2014 1230   CL 105 04/02/2014 1230   CO2 24 05/24/2014 1541   CO2 25 04/02/2014 1230   GLUCOSE 122 05/24/2014 1541   GLUCOSE 87 04/02/2014 1230   BUN 25.9 05/24/2014 1541   BUN 21 04/02/2014 1230   CREATININE 0.9 05/24/2014 1541   CREATININE 0.85 04/02/2014 1230   CALCIUM 8.9 05/24/2014 1541   CALCIUM  9.6 04/02/2014 1230   PROT 6.8 05/24/2014 1541   PROT 7.0 12/30/2012 0545   ALBUMIN 2.9* 05/24/2014 1541   ALBUMIN 3.7 12/30/2012 0545   AST 27 05/24/2014 1541   AST 27 12/30/2012 0545   ALT 38 05/24/2014 1541   ALT 31 12/30/2012 0545   ALKPHOS 95 05/24/2014 1541   ALKPHOS 86 12/30/2012 0545   BILITOT 0.33 05/24/2014 1541   BILITOT 0.7 12/30/2012 0545   GFRNONAA 85* 04/02/2014 1230   GFRAA >90 04/02/2014 1230      Impression: Tolerating radiation therapy but mental status changes of unclear etiology. Differential Dx includes: Medication causes, sundowning, infection, electrolyte abnormality, wernicke's encephalopathy.  Plan: Family is trying to minimize use of Morphine (discussed w/ son today) but he still needs some for pain control over his duragesic.  UA will be ordered to r/o UTI.  CMP, CBC, thiamine serum labs. Talked to nursing in med/onc  with verbal order to add thiamine to his IV fluids this week and I made a Rx to start oral supplement as well. Continue radiation therapy as planned. F/u in 1-2 weeks. -----------------------------------  Eppie Gibson, MD

## 2014-05-24 NOTE — Progress Notes (Addendum)
Weekly rad txs 34 completed b/l neck, dry desquamation on both sides,peeling,erythema,using biafine 2-3x day, looks like thrush on patient's tongue, hurts to swallow water, osmolite 1.5-2 cans 3x day via peg, with free watert flush before and after feedings, working on 7 cans for 4th deeding, in w/c, will get labs after MD sees him and before he gets fluids today stated, weakness nonausea andd taking compazine/zofran  2:35 PM BP 131/54 mmHg  Pulse 79  Temp(Src) 98.9 F (37.2 C) (Oral)  Resp 20  Wt 171 lb 6.4 oz (77.747 kg)  SpO2 99%  Wt Readings from Last 3 Encounters:  05/24/14 171 lb 6.4 oz (77.747 kg)  05/10/14 184 lb (83.462 kg)  05/03/14 188 lb 6.4 oz (85.458 kg)

## 2014-05-24 NOTE — Progress Notes (Signed)
Notified pt wife 1:15pm lab appt.  The stated they will be here.

## 2014-05-25 ENCOUNTER — Encounter: Payer: Self-pay | Admitting: *Deleted

## 2014-05-25 ENCOUNTER — Encounter: Payer: Self-pay | Admitting: Nutrition

## 2014-05-25 ENCOUNTER — Encounter (HOSPITAL_COMMUNITY): Payer: Self-pay | Admitting: *Deleted

## 2014-05-25 ENCOUNTER — Inpatient Hospital Stay (HOSPITAL_COMMUNITY)
Admission: AD | Admit: 2014-05-25 | Discharge: 2014-06-03 | DRG: 056 | Disposition: A | Discharge status: Skilled Nursing Facility | Payer: Medicare Other | Source: Ambulatory Visit | Attending: Internal Medicine | Admitting: Internal Medicine

## 2014-05-25 ENCOUNTER — Inpatient Hospital Stay (HOSPITAL_COMMUNITY): Payer: Medicare Other

## 2014-05-25 ENCOUNTER — Ambulatory Visit: Payer: Self-pay

## 2014-05-25 ENCOUNTER — Ambulatory Visit
Admission: RE | Admit: 2014-05-25 | Discharge: 2014-05-25 | Disposition: A | Discharge status: Home / Self Care | Payer: Medicare Other | Source: Ambulatory Visit | Attending: Radiation Oncology | Admitting: Radiation Oncology

## 2014-05-25 ENCOUNTER — Telehealth: Payer: Self-pay | Admitting: *Deleted

## 2014-05-25 ENCOUNTER — Encounter: Payer: Self-pay | Admitting: Radiation Oncology

## 2014-05-25 DIAGNOSIS — R410 Disorientation, unspecified: Secondary | ICD-10-CM | POA: Diagnosis present

## 2014-05-25 DIAGNOSIS — E860 Dehydration: Secondary | ICD-10-CM | POA: Diagnosis present

## 2014-05-25 DIAGNOSIS — G919 Hydrocephalus, unspecified: Secondary | ICD-10-CM | POA: Diagnosis not present

## 2014-05-25 DIAGNOSIS — E43 Unspecified severe protein-calorie malnutrition: Secondary | ICD-10-CM | POA: Diagnosis not present

## 2014-05-25 DIAGNOSIS — E780 Pure hypercholesterolemia: Secondary | ICD-10-CM | POA: Diagnosis present

## 2014-05-25 DIAGNOSIS — R1311 Dysphagia, oral phase: Secondary | ICD-10-CM | POA: Diagnosis present

## 2014-05-25 DIAGNOSIS — Z801 Family history of malignant neoplasm of trachea, bronchus and lung: Secondary | ICD-10-CM | POA: Diagnosis not present

## 2014-05-25 DIAGNOSIS — Z7902 Long term (current) use of antithrombotics/antiplatelets: Secondary | ICD-10-CM

## 2014-05-25 DIAGNOSIS — K219 Gastro-esophageal reflux disease without esophagitis: Secondary | ICD-10-CM | POA: Diagnosis present

## 2014-05-25 DIAGNOSIS — D630 Anemia in neoplastic disease: Secondary | ICD-10-CM | POA: Diagnosis not present

## 2014-05-25 DIAGNOSIS — R220 Localized swelling, mass and lump, head: Secondary | ICD-10-CM | POA: Insufficient documentation

## 2014-05-25 DIAGNOSIS — Z6823 Body mass index (BMI) 23.0-23.9, adult: Secondary | ICD-10-CM | POA: Diagnosis not present

## 2014-05-25 DIAGNOSIS — Z87442 Personal history of urinary calculi: Secondary | ICD-10-CM

## 2014-05-25 DIAGNOSIS — T451X5A Adverse effect of antineoplastic and immunosuppressive drugs, initial encounter: Secondary | ICD-10-CM | POA: Diagnosis present

## 2014-05-25 DIAGNOSIS — E039 Hypothyroidism, unspecified: Secondary | ICD-10-CM | POA: Diagnosis present

## 2014-05-25 DIAGNOSIS — Z9221 Personal history of antineoplastic chemotherapy: Secondary | ICD-10-CM

## 2014-05-25 DIAGNOSIS — F05 Delirium due to known physiological condition: Secondary | ICD-10-CM | POA: Diagnosis not present

## 2014-05-25 DIAGNOSIS — F900 Attention-deficit hyperactivity disorder, predominantly inattentive type: Secondary | ICD-10-CM | POA: Diagnosis not present

## 2014-05-25 DIAGNOSIS — F1020 Alcohol dependence, uncomplicated: Secondary | ICD-10-CM | POA: Diagnosis not present

## 2014-05-25 DIAGNOSIS — E785 Hyperlipidemia, unspecified: Secondary | ICD-10-CM | POA: Diagnosis present

## 2014-05-25 DIAGNOSIS — F419 Anxiety disorder, unspecified: Secondary | ICD-10-CM | POA: Diagnosis present

## 2014-05-25 DIAGNOSIS — S91002A Unspecified open wound, left ankle, initial encounter: Secondary | ICD-10-CM | POA: Diagnosis not present

## 2014-05-25 DIAGNOSIS — Z923 Personal history of irradiation: Secondary | ICD-10-CM

## 2014-05-25 DIAGNOSIS — Z931 Gastrostomy status: Secondary | ICD-10-CM | POA: Diagnosis not present

## 2014-05-25 DIAGNOSIS — Z79899 Other long term (current) drug therapy: Secondary | ICD-10-CM

## 2014-05-25 DIAGNOSIS — F1021 Alcohol dependence, in remission: Secondary | ICD-10-CM | POA: Diagnosis present

## 2014-05-25 DIAGNOSIS — F22 Delusional disorders: Secondary | ICD-10-CM | POA: Diagnosis present

## 2014-05-25 DIAGNOSIS — R05 Cough: Secondary | ICD-10-CM | POA: Diagnosis not present

## 2014-05-25 DIAGNOSIS — Z79891 Long term (current) use of opiate analgesic: Secondary | ICD-10-CM

## 2014-05-25 DIAGNOSIS — F0390 Unspecified dementia without behavioral disturbance: Secondary | ICD-10-CM | POA: Diagnosis present

## 2014-05-25 DIAGNOSIS — D701 Agranulocytosis secondary to cancer chemotherapy: Secondary | ICD-10-CM | POA: Diagnosis present

## 2014-05-25 DIAGNOSIS — F909 Attention-deficit hyperactivity disorder, unspecified type: Secondary | ICD-10-CM | POA: Diagnosis present

## 2014-05-25 DIAGNOSIS — M1990 Unspecified osteoarthritis, unspecified site: Secondary | ICD-10-CM | POA: Diagnosis present

## 2014-05-25 DIAGNOSIS — E871 Hypo-osmolality and hyponatremia: Secondary | ICD-10-CM | POA: Insufficient documentation

## 2014-05-25 DIAGNOSIS — R591 Generalized enlarged lymph nodes: Secondary | ICD-10-CM | POA: Diagnosis not present

## 2014-05-25 DIAGNOSIS — Z8673 Personal history of transient ischemic attack (TIA), and cerebral infarction without residual deficits: Secondary | ICD-10-CM

## 2014-05-25 DIAGNOSIS — Z7982 Long term (current) use of aspirin: Secondary | ICD-10-CM | POA: Diagnosis not present

## 2014-05-25 DIAGNOSIS — Z808 Family history of malignant neoplasm of other organs or systems: Secondary | ICD-10-CM | POA: Diagnosis not present

## 2014-05-25 DIAGNOSIS — R0602 Shortness of breath: Secondary | ICD-10-CM | POA: Diagnosis not present

## 2014-05-25 DIAGNOSIS — I10 Essential (primary) hypertension: Secondary | ICD-10-CM | POA: Diagnosis present

## 2014-05-25 DIAGNOSIS — R4182 Altered mental status, unspecified: Secondary | ICD-10-CM | POA: Diagnosis not present

## 2014-05-25 DIAGNOSIS — R4020 Unspecified coma: Secondary | ICD-10-CM | POA: Diagnosis not present

## 2014-05-25 DIAGNOSIS — G4700 Insomnia, unspecified: Secondary | ICD-10-CM | POA: Diagnosis present

## 2014-05-25 DIAGNOSIS — G912 (Idiopathic) normal pressure hydrocephalus: Principal | ICD-10-CM | POA: Diagnosis present

## 2014-05-25 DIAGNOSIS — F908 Attention-deficit hyperactivity disorder, other type: Secondary | ICD-10-CM | POA: Diagnosis not present

## 2014-05-25 DIAGNOSIS — Z87891 Personal history of nicotine dependence: Secondary | ICD-10-CM

## 2014-05-25 DIAGNOSIS — T17998D Other foreign object in respiratory tract, part unspecified causing other injury, subsequent encounter: Secondary | ICD-10-CM | POA: Diagnosis not present

## 2014-05-25 DIAGNOSIS — T17908A Unspecified foreign body in respiratory tract, part unspecified causing other injury, initial encounter: Secondary | ICD-10-CM

## 2014-05-25 DIAGNOSIS — Z8249 Family history of ischemic heart disease and other diseases of the circulatory system: Secondary | ICD-10-CM

## 2014-05-25 DIAGNOSIS — R443 Hallucinations, unspecified: Secondary | ICD-10-CM

## 2014-05-25 DIAGNOSIS — E46 Unspecified protein-calorie malnutrition: Secondary | ICD-10-CM | POA: Diagnosis not present

## 2014-05-25 DIAGNOSIS — C01 Malignant neoplasm of base of tongue: Secondary | ICD-10-CM | POA: Diagnosis present

## 2014-05-25 DIAGNOSIS — C029 Malignant neoplasm of tongue, unspecified: Secondary | ICD-10-CM | POA: Diagnosis not present

## 2014-05-25 HISTORY — DX: Disorientation, unspecified: R41.0

## 2014-05-25 LAB — CBC
HEMATOCRIT: 28.9 % — AB (ref 39.0–52.0)
HEMOGLOBIN: 9.8 g/dL — AB (ref 13.0–17.0)
MCH: 29.7 pg (ref 26.0–34.0)
MCHC: 33.9 g/dL (ref 30.0–36.0)
MCV: 87.6 fL (ref 78.0–100.0)
Platelets: 401 10*3/uL — ABNORMAL HIGH (ref 150–400)
RBC: 3.3 MIL/uL — ABNORMAL LOW (ref 4.22–5.81)
RDW: 13.1 % (ref 11.5–15.5)
WBC: 2.7 10*3/uL — ABNORMAL LOW (ref 4.0–10.5)

## 2014-05-25 LAB — CREATININE, SERUM: Creatinine, Ser: 1.08 mg/dL (ref 0.61–1.24)

## 2014-05-25 MED ORDER — LORAZEPAM 2 MG/ML IJ SOLN
1.0000 mg | Freq: Four times a day (QID) | INTRAMUSCULAR | Status: AC | PRN
  Administered 2014-05-26 (×2): 1 mg via INTRAVENOUS
  Filled 2014-05-25 (×2): qty 1

## 2014-05-25 MED ORDER — LORAZEPAM 1 MG PO TABS
1.0000 mg | ORAL_TABLET | Freq: Four times a day (QID) | ORAL | Status: AC | PRN
  Administered 2014-05-27: 1 mg via ORAL
  Filled 2014-05-25: qty 1

## 2014-05-25 MED ORDER — FREE WATER
250.0000 mL | Freq: Three times a day (TID) | Status: DC
  Administered 2014-05-25 – 2014-05-30 (×13): 250 mL

## 2014-05-25 MED ORDER — ONDANSETRON HCL 4 MG PO TABS: 4.0000 mg | ORAL_TABLET | Freq: Four times a day (QID) | ORAL | Status: DC | PRN

## 2014-05-25 MED ORDER — ATORVASTATIN CALCIUM 20 MG PO TABS
20.0000 mg | ORAL_TABLET | Freq: Every day | ORAL | Status: DC
  Administered 2014-05-26 – 2014-06-02 (×8): 20 mg via ORAL
  Filled 2014-05-25 (×10): qty 1

## 2014-05-25 MED ORDER — VITAMIN B-1 100 MG PO TABS
100.0000 mg | ORAL_TABLET | Freq: Every day | ORAL | Status: DC
  Administered 2014-05-26 – 2014-06-03 (×9): 100 mg via ORAL
  Filled 2014-05-25 (×10): qty 1

## 2014-05-25 MED ORDER — LORAZEPAM 2 MG/ML IJ SOLN
0.0000 mg | Freq: Four times a day (QID) | INTRAMUSCULAR | Status: AC
  Administered 2014-05-25: 2 mg via INTRAVENOUS
  Administered 2014-05-25 – 2014-05-26 (×4): 1 mg via INTRAVENOUS
  Administered 2014-05-27 (×2): 2 mg via INTRAVENOUS
  Filled 2014-05-25 (×8): qty 1

## 2014-05-25 MED ORDER — FOLIC ACID 1 MG PO TABS
1.0000 mg | ORAL_TABLET | Freq: Every day | ORAL | Status: DC
  Administered 2014-05-26 – 2014-06-03 (×9): 1 mg via ORAL
  Filled 2014-05-25 (×10): qty 1

## 2014-05-25 MED ORDER — FENTANYL 25 MCG/HR TD PT72
25.0000 ug | MEDICATED_PATCH | TRANSDERMAL | Status: DC
  Administered 2014-05-25: 25 ug via TRANSDERMAL
  Filled 2014-05-25: qty 1

## 2014-05-25 MED ORDER — ASPIRIN 81 MG PO CHEW
81.0000 mg | CHEWABLE_TABLET | Freq: Every day | ORAL | Status: DC
  Administered 2014-05-26 – 2014-06-03 (×9): 81 mg via ORAL
  Filled 2014-05-25 (×10): qty 1

## 2014-05-25 MED ORDER — OSMOLITE 1.5 CAL PO LIQD
1000.0000 mL | ORAL | Status: DC
  Filled 2014-05-25: qty 1000

## 2014-05-25 MED ORDER — CLOPIDOGREL BISULFATE 75 MG PO TABS
75.0000 mg | ORAL_TABLET | Freq: Every day | ORAL | Status: DC
  Administered 2014-05-26 – 2014-06-03 (×9): 75 mg via ORAL
  Filled 2014-05-25 (×12): qty 1

## 2014-05-25 MED ORDER — ONDANSETRON HCL 4 MG/2ML IJ SOLN
4.0000 mg | Freq: Four times a day (QID) | INTRAMUSCULAR | Status: DC | PRN
  Filled 2014-05-25: qty 2

## 2014-05-25 MED ORDER — ADULT MULTIVITAMIN W/MINERALS CH
1.0000 | ORAL_TABLET | Freq: Every day | ORAL | Status: DC
  Administered 2014-05-26 – 2014-06-03 (×9): 1 via ORAL
  Filled 2014-05-25 (×10): qty 1

## 2014-05-25 MED ORDER — ENOXAPARIN SODIUM 40 MG/0.4ML ~~LOC~~ SOLN
40.0000 mg | SUBCUTANEOUS | Status: DC
  Administered 2014-05-25 – 2014-06-02 (×9): 40 mg via SUBCUTANEOUS
  Filled 2014-05-25 (×10): qty 0.4

## 2014-05-25 MED ORDER — FENTANYL 25 MCG/HR TD PT72: 25.0000 ug | MEDICATED_PATCH | TRANSDERMAL | Status: DC

## 2014-05-25 MED ORDER — HALOPERIDOL LACTATE 5 MG/ML IJ SOLN
2.5000 mg | Freq: Once | INTRAMUSCULAR | Status: AC
  Administered 2014-05-25: 2.5 mg via INTRAMUSCULAR
  Filled 2014-05-25: qty 1

## 2014-05-25 MED ORDER — MORPHINE SULFATE 2 MG/ML IJ SOLN
1.0000 mg | INTRAMUSCULAR | Status: DC | PRN
  Filled 2014-05-25: qty 1

## 2014-05-25 MED ORDER — SODIUM CHLORIDE 0.9 % IV SOLN
INTRAVENOUS | Status: DC
  Administered 2014-05-25 – 2014-05-29 (×5): via INTRAVENOUS

## 2014-05-25 MED ORDER — AMPHETAMINE-DEXTROAMPHETAMINE 10 MG PO TABS
20.0000 mg | ORAL_TABLET | Freq: Two times a day (BID) | ORAL | Status: DC
  Administered 2014-05-26 – 2014-06-03 (×16): 20 mg via ORAL
  Filled 2014-05-25 (×16): qty 2

## 2014-05-25 MED ORDER — LORAZEPAM 2 MG/ML IJ SOLN
0.0000 mg | Freq: Two times a day (BID) | INTRAMUSCULAR | Status: AC
  Administered 2014-05-27: 2 mg via INTRAVENOUS
  Administered 2014-05-28 – 2014-05-29 (×2): 1 mg via INTRAVENOUS
  Filled 2014-05-25 (×4): qty 1

## 2014-05-25 MED ORDER — LEVOTHYROXINE SODIUM 75 MCG PO TABS
75.0000 ug | ORAL_TABLET | Freq: Every day | ORAL | Status: DC
  Administered 2014-05-26 – 2014-06-03 (×9): 75 ug via ORAL
  Filled 2014-05-25 (×11): qty 1

## 2014-05-25 MED ORDER — THIAMINE HCL 100 MG/ML IJ SOLN
100.0000 mg | Freq: Every day | INTRAMUSCULAR | Status: DC
  Filled 2014-05-25 (×10): qty 1

## 2014-05-25 NOTE — Progress Notes (Signed)
Was able to give Ativan IM to L deltoid with assist of 2 NTs. Patient now refusing to stay in his room. Staff ambulating with him in hall. Awaiting for medication to begin to take effect to attempt [facility] access & blood draw.Harlene Ramus

## 2014-05-25 NOTE — Progress Notes (Signed)
Around 5pm patient began to become restless. Oriented to person only. Stated he was ready to leave. Un able to keep patient in bed and he is unsteady on feet especially when standing.Patient would not allow staff to assist him back to bed or sit in chair. Notified Dr Darrick Meigs & received order to give dose of scheduled Ativan IM.Harlene Ramus

## 2014-05-25 NOTE — Progress Notes (Signed)
Oncology Nurse Navigator Documentation  Oncology Nurse Navigator Flowsheets 05/20/2014 05/20/2014 05/21/2014 05/24/2014 05/25/2014 05/25/2014 05/25/2014  Navigator Encounter Type Telephone Treatment Treatment Telephone Telephone Telephone Treatment  Patient Visit Type - Radonc Radonc - - - Radonc  Treatment Phase - Treatment Treatment - - Other -  Barriers/Navigation Needs - No barriers at this time;Education - - - - -  Education - Pain/ Symptom Management - - - - -  Interventions - - - - - Coordination of Care Coordination of Care - [id number]  Met with pt during final RT to offer support and to celebrate end of radiation treatment.  He was accompanied by his wife, son, business partner, friend. 1. I explained that my role as navigator will continue for several more months and that I will be calling and/or joining him during follow-up visits.   2. I provided certificates of recognition to wife, son and business partner. 3. I encouraged him to call me with needs/concerns.   4. He/family verbalized understanding of information provided.  Escorted patient to [place], facilitated admission to [place].  Provided report to RN Joelene Millin, provided my contact information.    Coordination of Care - - - - - Other -  Time Spent with Patient 15 60 30 15 15 30  [place], RN, Copywriter, advertising, [place] at [place] [phone]

## 2014-05-25 NOTE — H&P (Addendum)
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 He also had PEG tube placed earlier this month, when all of his by mouth medications were discontinued. Patient has been getting tube feedings with Osmolite 1 can 6 times a day through the PEG tube. Patient also has long-standing history of alkaline abuse and quit drinking almost 10 weeks ago. Patient was doing fine until 2 weeks ago when he started having mental status changes including hallucinations, paranoia, muscle twitching, anxiety, insomnia. Yesterday he was seen at the oncology clinic where he was given thiamine for possible Wernicke's encephalopathy. Patient also has been taking morphine along with Duragesic for pain control though morphine he has been taking only 0.5 mg 2-3 times a day. Patient currently is confused and having delusions. Though he is alert and oriented 2. He denies chest pain, no shortness of breath, no nausea vomiting. Patient did have loose stools from the PEG tube feedings, with stopped after patient was started on morphine.   Allergies:  No Known Allergies    Past Medical History  Diagnosis Date  . Hyperlipemia   . Arthritis   . ADHD (attention deficit hyperactivity disorder)   . Wears glasses   . Hypercholesterolemia   . Peyronie's disease   . Kidney stones   . TIA (transient ischemic attack)   . Hypertension   . Anxiety   . GERD (gastroesophageal reflux disease)   . 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 TIA (transient ischemic attack) 12/29/12    left facial droop    Past Surgical History  Procedure Laterality Date  . Dupuytren contracture release  2012    left hand  . Tonsillectomy    . Shoulder arthroscopy w/ rotator cuff repair      left  . Colonoscopy    . Cystoscopy w/ ureteroscopy w/ lithotripsy  2011  . Hand surgery      right  . Dupuytren contracture release Right 10/30/2012    Procedure: DUPUYTREN CONTRACTURE RELEASE RIGHT PALM,RING AND SMALL FINGER;  Surgeon: Cammie Sickle., MD;  Location: [facility];  Service: Orthopedics;  Laterality: Right;    Prior to Admission medications   Medication Sig Start Date End Date Taking? Authorizing Provider  emollient (BIAFINE) cream Apply topically as needed (radiation burn).    Yes Historical Provider, MD  fentaNYL (DURAGESIC - DOSED MCG/HR) 25 MCG/HR patch Place 1 patch (25 mcg total) onto the skin every 3 (three) days. 05/17/14  Yes Heath Lark, MD  lidocaine-prilocaine (EMLA) cream Apply to affected area once 04/05/14  Yes Heath Lark, MD  LORazepam (ATIVAN) 0.5 MG tablet Take 1 tablet approximately 30 min prior to radiotherapy for anxiety/claustrophobia 04/06/14  Yes Eppie Gibson, MD  Morphine Sulfate (MORPHINE CONCENTRATE) 10 MG/0.5ML SOLN concentrated solution Take 0.5 mLs (10 mg total) by mouth every 2 (two) hours as needed. 04/21/14  Yes Heath Lark, MD  Nutritional Supplements (FEEDING SUPPLEMENT, OSMOLITE 1.5 CAL,) LIQD Give 2 cans of Osmolite 1.5 via PEG TID and 1 can once daily with 120  cc free water before and after bolus feeding.  Drink or flush feeding tube with an additional 250 cc free water daily. 05/20/14  Yes Heath Lark, MD  ondansetron (ZOFRAN) 8 MG tablet Take 1 tablet (8 mg total) by mouth every 8 (eight) hours as needed. 04/05/14  Yes Heath Lark, MD  prochlorperazine (COMPAZINE) 10 MG tablet Take 1 tablet (10 mg total) by mouth every 6 (six) hours as needed  (Nausea or vomiting). 04/05/14  Yes Heath Lark, MD  sucralfate (CARAFATE) 1 G tablet Dissolve 1 tablet in 10 mL H20 and swallow QID PRN sore throat. 04/12/14  Yes Eppie Gibson, MD  thiamine (VITAMIN B-1) 100 MG tablet Take 1 tablet TID for one week, then 1 tablet daily thereafter 05/24/14  Yes Eppie Gibson, MD  Wound Dressings (RADIAGEL) GEL Apply topically 2 (two) times daily.   Yes Historical Provider, MD  amphetamine-dextroamphetamine (ADDERALL) 20 MG tablet Take 20 mg by mouth 2 (two) times daily.    Historical Provider, MD  aspirin 81 MG tablet Take 81 mg by mouth daily.    Historical Provider, MD  atorvastatin (LIPITOR) 20 MG tablet Take 20 mg by mouth daily at 6 PM.  02/21/14   Historical Provider, MD  clopidogrel (PLAVIX) 75 MG tablet Take 75 mg by mouth daily.  03/12/14   Historical Provider, MD  fluconazole (DIFLUCAN) 100 MG tablet Take 2 tablets today, then 1 tablet daily x 20 more days for yeast in mouth. Patient not taking: Reported on 05/25/2014 04/19/14   Eppie Gibson, MD  levothyroxine (SYNTHROID, LEVOTHROID) 75 MCG tablet Take 75 mcg by mouth daily before breakfast.     Seward Carol, MD  lidocaine (XYLOCAINE) 2 % solution Patient: Mix 1part 2% viscous lidocaine, 1part H20. Swish and/or swallow 46mL of this mixture, 49min before meals and at bedtime, up to QID Patient not taking: Reported on 05/25/2014 04/12/14   Eppie Gibson, MD  Multiple Vitamin (MULTIVITAMIN WITH MINERALS) TABS tablet Take 1 tablet by mouth daily.    Historical Provider, MD  ranitidine (ZANTAC) 150 MG/10ML syrup Take 10 mLs (150 mg total) by mouth 2 (two) times daily. May flush into PEG tube. To prevent indigestion. Patient not taking: Reported on 05/17/2014 04/12/14   Eppie Gibson, MD  sodium fluoride (FLUORISHIELD) 1.1 % GEL dental gel Instill one drop of gel per tooth space of fluoride tray. Place over teeth for 5 minutes. Remove. Spit out excess. Repeat nightly. Patient not taking: Reported on 05/25/2014 03/22/14   Lenn Cal, DDS    Social History:  reports that he quit smoking about 2 months ago. His smoking use included Cigarettes and Cigars. He has a 40 pack-year smoking history. He has never used smokeless tobacco. He reports that he drinks alcohol. He reports that he does not use illicit drugs.  Family History  Problem Relation Age of Onset  . Heart attack Mother     Deceased, 66  . Hypothyroidism Father     Deceased, 27  . Melanoma Brother     Living, 73  . Healthy Son   . Cancer Paternal Uncle     ?lung ca      Review of Systems:  As the history of present illness, rest of the review of systems is unobtainable at this time due to patient's altered mental status   Physical Exam: Blood pressure 150/82, pulse 90, temperature 98.9 F (37.2 C), temperature source Oral, resp. rate 18, SpO2 93 %. Constitutional:   Patient is a well-developed  and well-nourished male, mildly anxious but cooperative with exam. Head: Normocephalic and atraumatic Mouth: Mucus membranes moist Eyes: PERRL, EOMI, conjunctivae normal Neck: Supple, No Thyromegaly Cardiovascular: RRR, S1 normal, S2 normal Pulmonary/Chest: CTAB, no wheezes, rales, or rhonchi Abdominal: Soft. Non-tender, non-distended, bowel sounds are normal, no masses, organomegaly, or guarding present.  Neurological: A&O x3, Strength is normal and symmetric bilaterally, cranial nerve II-XII are grossly intact, no focal motor deficit, sensory intact to light touch bilaterally.  Extremities : Positive tremors, muscle twitching noted in the arms and legs  Labs on Admission:  Basic Metabolic Panel:  Recent Labs Lab 05/20/14 1447 05/24/14 1541  NA 134* 136  K 4.8 4.8  CO2 24 24  GLUCOSE 110 122  BUN 26.8* 25.9  CREATININE 0.9 0.9  CALCIUM 8.8 8.9  MG 2.0  --    Liver Function Tests:  Recent Labs Lab 05/20/14 1447 05/24/14 1541  AST 25 27  ALT 31 38  ALKPHOS 93 95  BILITOT 0.42 0.33  PROT 6.5 6.8  ALBUMIN 2.7* 2.9*   No  results for input(s): LIPASE, AMYLASE in the last 168 hours. No results for input(s): AMMONIA in the last 168 hours. CBC:  Recent Labs Lab 05/20/14 1446 05/24/14 1541  WBC 3.3* 1.9*  NEUTROABS 2.6 1.0*  HGB 9.8* 10.1*  HCT 28.8* 29.7*  MCV 87.0 86.6  PLT 298 368      Assessment/Plan Active Problems:   ADHD (attention deficit hyperactivity disorder)   Malignant neoplasm of base of tongue   Chronic alcoholism   Altered mental status  Altered mental status Patient presenting with altered mental status which is likely a combination of alcohol withdrawal, as well as abruptly stopping Adderall. We'll start the patient on CIWA protocol. I will restart the Adderall 20 mg twice a day which was stopped 2 weeks ago. Will continue with thiamine and folate, IV fluid hydration with normal saline.  Chronic alcoholism Patient did not drink alcohol in the last 10 weeks, still presenting with symptoms like alcohol withdrawal. We'll start alcohol withdrawal protocol with Ativan.  ADHD Restart Adderall as above  Nutrition Patient is on Osmolite via PEG tube, will start Osmolite at 30 mL per hour. Consult dietitian for adjustment of the rate.  Hypothyroidism Continue Synthroid  History of CVA Continue Plavix and aspirin Will obtain CT head to rule out underlying CVA.  Malignant neoplasm of the tongue Patient followed by oncology Dr Alvy Bimler  Code status: Patient is full code  Family discussion: Admission, patients condition and plan of care including tests being ordered have been discussed with the patient's son at bedside who indicate understanding and agree with the plan and Code Status.   Time Spent on Admission: 60 min  [facility] Pager: [phone] 05/25/2014, 3:32 PM  If 7PM-7AM, please contact night-coverage  [email or web address]  Password [email or web address]

## 2014-05-25 NOTE — Telephone Encounter (Signed)
Oncology Nurse Navigator Documentation  Oncology Nurse Navigator Flowsheets 05/18/2014 05/20/2014 05/20/2014 05/21/2014 05/24/2014 05/25/2014  Navigator Encounter Type Telephone Telephone Treatment Treatment Telephone Telephone - Returned wife's VM.   She reported:  Patient was up all HS, found going into attic; very disoriented; seeing people in shadows.  She expressed concern about his mgt during the HS, that she is at the point emotionally and physically where she cannot not manage him.  She indicated that d/t oversight, patient has not been taking synthroid, she will start. I indicated I would notify Dr. Isidore Moos.   Patient Visit Type - - Radonc Radonc - -  Treatment Phase - - Treatment Treatment - -  Barriers/Navigation Needs No barriers at this time - No barriers at this time;Education - - -  Education - - Pain/ Symptom Management - - -  Interventions None required - - - - -  Time Spent with Patient 15 15 60 30 15 15    Gayleen Orem, RN, BSN, [place] at [place] [phone]

## 2014-05-25 NOTE — Progress Notes (Signed)
Oncology Nurse Navigator Documentation  Oncology Nurse Navigator Flowsheets 05/18/2014 05/20/2014 05/20/2014 05/21/2014 05/24/2014 05/25/2014 05/25/2014  Navigator Encounter Type Telephone Telephone Treatment Treatment Telephone Telephone Telephone  Per guidance from Dr. Isidore Moos, called Patient Placement spoke with Shawn, arranged [facility] direct admission for patient with Dr. Clementeen Graham, Medical Hospitalist Service.  Shawn understands that I will bring patient to [facility] Admitting after his 1:40 pm RT.  I called patient wife to inform of arrangements.  She verbalized understanding.   Patient Visit Type - - Radonc Radonc - - -  Treatment Phase - - Treatment Treatment - - Other  Barriers/Navigation Needs No barriers at this time - No barriers at this time;Education - - - -  Education - - Pain/ Symptom Management - - - -  Interventions None required - - - - - Coordination of Care  Coordination of Care - - - - - - Other  Time Spent with Patient 15 15 60 30 15 15 30     Gayleen Orem, RN, BSN, [place] at [place] [phone]

## 2014-05-26 ENCOUNTER — Ambulatory Visit: Payer: Self-pay

## 2014-05-26 ENCOUNTER — Encounter: Payer: Self-pay | Admitting: *Deleted

## 2014-05-26 ENCOUNTER — Telehealth: Payer: Self-pay | Admitting: *Deleted

## 2014-05-26 DIAGNOSIS — R410 Disorientation, unspecified: Secondary | ICD-10-CM

## 2014-05-26 DIAGNOSIS — E43 Unspecified severe protein-calorie malnutrition: Secondary | ICD-10-CM | POA: Insufficient documentation

## 2014-05-26 DIAGNOSIS — F908 Attention-deficit hyperactivity disorder, other type: Secondary | ICD-10-CM

## 2014-05-26 LAB — COMPREHENSIVE METABOLIC PANEL
ALT: 30 U/L (ref 17–63)
AST: 26 U/L (ref 15–41)
Albumin: 3.2 g/dL — ABNORMAL LOW (ref 3.5–5.0)
Alkaline Phosphatase: 86 U/L (ref 38–126)
Anion gap: 10 (ref 5–15)
BUN: 26 mg/dL — ABNORMAL HIGH (ref 6–20)
CALCIUM: 9.3 mg/dL (ref 8.9–10.3)
CHLORIDE: 99 mmol/L — AB (ref 101–111)
CO2: 25 mmol/L (ref 22–32)
CREATININE: 1.02 mg/dL (ref 0.61–1.24)
GFR calc non Af Amer: >60 mL/min (ref >60)
Glucose, Bld: 101 mg/dL — ABNORMAL HIGH (ref 65–99)
Potassium: 4.5 mmol/L (ref 3.5–5.1)
SODIUM: 134 mmol/L — AB (ref 135–145)
Total Bilirubin: 0.5 mg/dL (ref 0.3–1.2)
Total Protein: 6.9 g/dL (ref 6.5–8.1)

## 2014-05-26 LAB — CBC
HCT: 29.2 % — ABNORMAL LOW (ref 39.0–52.0)
HEMOGLOBIN: 10 g/dL — AB (ref 13.0–17.0)
MCH: 29.8 pg (ref 26.0–34.0)
MCHC: 34.2 g/dL (ref 30.0–36.0)
MCV: 86.9 fL (ref 78.0–100.0)
Platelets: 400 10*3/uL (ref 150–400)
RBC: 3.36 MIL/uL — AB (ref 4.22–5.81)
RDW: 13.1 % (ref 11.5–15.5)
WBC: 2.9 10*3/uL — ABNORMAL LOW (ref 4.0–10.5)

## 2014-05-26 LAB — ETHANOL

## 2014-05-26 LAB — GLUCOSE, CAPILLARY: GLUCOSE-CAPILLARY: 139 mg/dL — AB (ref 65–99)

## 2014-05-26 MED ORDER — HALOPERIDOL LACTATE 5 MG/ML IJ SOLN
2.5000 mg | Freq: Once | INTRAMUSCULAR | Status: AC
  Administered 2014-05-26: 2.5 mg via INTRAMUSCULAR
  Filled 2014-05-26: qty 1

## 2014-05-26 MED ORDER — OSMOLITE 1.5 CAL PO LIQD
474.0000 mL | Freq: Three times a day (TID) | ORAL | Status: DC
  Administered 2014-05-26 – 2014-05-29 (×10): 474 mL
  Filled 2014-05-26 (×14): qty 474

## 2014-05-26 MED ORDER — FENTANYL 12 MCG/HR TD PT72
12.5000 ug | MEDICATED_PATCH | TRANSDERMAL | Status: DC
  Administered 2014-05-26: 12.5 ug via TRANSDERMAL
  Filled 2014-05-26: qty 1

## 2014-05-26 MED ORDER — CHLORHEXIDINE GLUCONATE 0.12 % MT SOLN
15.0000 mL | Freq: Two times a day (BID) | OROMUCOSAL | Status: DC
  Administered 2014-05-26 – 2014-06-03 (×12): 15 mL via OROMUCOSAL
  Filled 2014-05-26 (×19): qty 15

## 2014-05-26 MED ORDER — JEVITY 1.2 CAL PO LIQD: 1000.0000 mL | ORAL | Status: DC

## 2014-05-26 MED ORDER — OSMOLITE 1.5 CAL PO LIQD
480.0000 mL | Freq: Three times a day (TID) | ORAL | Status: DC
  Filled 2014-05-26: qty 474
  Filled 2014-05-26 (×2): qty 711

## 2014-05-26 MED ORDER — CETYLPYRIDINIUM CHLORIDE 0.05 % MT LIQD
7.0000 mL | Freq: Two times a day (BID) | OROMUCOSAL | Status: DC
  Administered 2014-05-26 – 2014-06-02 (×9): 7 mL via OROMUCOSAL

## 2014-05-26 NOTE — Telephone Encounter (Signed)
Oncology Nurse Navigator Documentation  Oncology Nurse Navigator Flowsheets 05/20/2014 05/21/2014 05/24/2014 05/25/2014 05/25/2014 05/25/2014 05/26/2014  Navigator Encounter Type Treatment Treatment Telephone Telephone Telephone Treatment Telephone  Son called to express concern for his dad, the need to talk with attending MD.  He stated he is coming to see his dad this afternoon, I guided him to ask his dad's RN to page MD and request a family meeting to obtain an update.  I asked him to call me when he arrives.   Patient Visit Type Radonc Radonc - - - Radonc -  Treatment Phase Treatment Treatment - - Other - -  Barriers/Navigation Needs No barriers at this time;Education - - - - - -  Education Pain/ Symptom Management - - - - - -  Interventions - - - - Coordination of Care Coordination of Care -  Coordination of Care - - - - Other - -  Time Spent with Patient 60 30 15 15 30  [address] [place], Therapist, sports, Copywriter, advertising, [place] at [place] [phone]

## 2014-05-26 NOTE — Care Management Note (Signed)
Case Management Note  Patient Details  Name: Dylan Moreno MRN: [id number] Date of Birth: 03/07/1940  Subjective/Objective:     74 yo admitted with Altered Mental status               Action/Plan: From home with wife.  Expected Discharge Date:   (unknown)               Expected Discharge Plan:  [place]  In-House Referral:     Discharge planning Services  CM Consult  Post Acute Care Choice:    Choice offered to:     DME Arranged:    DME Agency:     HH Arranged:    HH Agency:     Status of Service:  In process, will continue to follow  Medicare Important Message Given:    Date Medicare IM Given:    Medicare IM give by:    Date Additional Medicare IM Given:    Additional Medicare Important Message give by:     If discussed at [place] of Stay Meetings, dates discussed:    Additional Comments: Unsure of disposition plans at this time. Pt could benefit from PT/OT consults once able to participate to assess for disposition needs. CM will continue to follow. Lynnell Catalan, RN 05/26/2014, 1:28 PM

## 2014-05-26 NOTE — Progress Notes (Signed)
Initial Nutrition Assessment  DOCUMENTATION CODES:  Severe malnutrition in context of chronic illness  INTERVENTION:  -Provide 2 cans of Osmolite 1.5 TID (10am, 2pm, 6pm).  -Flush with 120 ml free water before and after each bolus. -Recommend additional 250 ml of free water -RD to continue to monitor  NUTRITION DIAGNOSIS:  Malnutrition related to chronic illness as evidenced by severe depletion of muscle mass, percent weight loss.  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 He also had PEG tube placed earlier this month, when all of his by mouth medications were discontinued. Patient has been getting tube feedings with Osmolite 1 can 6 times a day through the PEG tube. PMHx of alcohol abuse, quit drinking 10 weeks ago.  Pt nonverbal with wrist restraints. Sitter at bedside. Per RN, pt was trying to pull PEG out last night.   Pt followed by [place] RD, last seen 5/19. Pt has been bolus feeding with Osmolite 1.5 6 cans daily at home. Per [place] RD note, pt flushes with 120 ml before and after feeds with an additional need of 250 ml of free water daily which he was able to take in orally.  Pt with continued weight loss, weighed 185 lb 3/16 (8% weight loss x 2 months, significant for time frame).  Labs reviewed: Low Na Elevated BUN  Height:  Ht Readings from Last 1 Encounters:  05/26/14 6\' 1"  (1.854 m)    Weight:  Wt Readings from Last 1 Encounters:  05/24/14 171 lb 6.4 oz (77.747 kg)    Ideal Body Weight:  83.6 kg  Wt Readings from Last 10 Encounters:  05/24/14 171 lb 6.4 oz (77.747 kg)  05/10/14 184 lb (83.462 kg)  05/03/14 188 lb 6.4 oz  (85.458 kg)  04/21/14 186 lb (84.369 kg)  04/21/14 185 lb 1.6 oz (83.961 kg)  04/05/14 190 lb (86.183 kg)  03/17/14 185 lb (83.915 kg)  08/04/13 186 lb 4 oz (84.482 kg)  05/04/13 191 lb 3 oz (86.722 kg)  02/03/13 198 lb (89.812 kg)    BMI:  22.7  Estimated Nutritional Needs:  Kcal:  2300-2500  Protein:  115-125g  Fluid:  2.5L/day     Skin:  Reviewed, no issues  Diet Order:  Diet NPO time specified  EDUCATION NEEDS:  No education needs identified at this time   Intake/Output Summary (Last 24 hours) at 05/26/14 1241 Last data filed at 05/26/14 0810  Gross per 24 hour  Intake   1095 ml  Output    150 ml  Net    945 ml    Last BM:  5/24  Clayton Bibles, MS, RD, LDN Pager: [phone] After Hours Pager: [phone]

## 2014-05-26 NOTE — Progress Notes (Signed)
  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 Malignant neoplasm of base of tongue 141.0 C01       Indication for treatment:  curative       Radiation treatment dates:   04/06/2014-05/25/2014   Site/dose:     Base of tongue and bilateral neck / 70 Gy in 35 fractions to gross disease, 63 Gy in 35 fractions to high risk nodal echelons, and 56 Gy in 35 fractions to intermediate risk nodal echelons  Beams/energy:   Helical IMRT / 6 MV photons  Narrative: The patient tolerated radiation treatment relatively well but then developed delirium of unknown causes, possibly medication related. He was admitted after his last treatment for workup.     Plan: The patient has completed radiation treatment. The patient will return to radiation oncology clinic for routine followup in one half month. I advised the patient to call or return sooner if any questions or concerns arise that are related to recovery or treatment.  -----------------------------------  Eppie Gibson, MD

## 2014-05-26 NOTE — Progress Notes (Signed)
Oncology Nurse Navigator Documentation  Oncology Nurse Navigator Flowsheets 05/21/2014 05/24/2014 05/25/2014 05/25/2014 05/25/2014 05/26/2014 05/26/2014  Navigator Encounter Type Treatment Telephone Telephone Telephone Treatment Other  Attended [facility]W [place] to hear pt discussion. Pt combative last HS, received Haldol; bilateral soft wrist restraints applied for patient safety.    Patient sleeping, Sitter at bedside.    Telephone  Patient Visit Type Radonc - - - Radonc Inpatient -  Treatment Phase Treatment - - Other - - -  Barriers/Navigation Needs - - - - - - -  Education - - - - - - -  Interventions - - - Coordination of Care Coordination of Care - -  Coordination of Care - - - Other - - -  Time Spent with Patient 30 15 15 30  90 45 15

## 2014-05-26 NOTE — Progress Notes (Signed)
This RN was called to the room by [employer] cabin crew. Pt was pulling strongly on his G Tube. This RN assisted sitter in reducing grip on G tube with great difficulty. PRN ativan was given, pt began to grab onto his IV tubing. Baltazar Najjar NP on call paged and orders received for additional dose of haldol and to put pt in restraints. Soft wrist restraints applied per order. Waist restraints not applied due to G tube. This RN called pt's friend Jackelyn Poling who came to the hospital to assist pt.

## 2014-05-26 NOTE — Progress Notes (Addendum)
[facility] PROGRESS NOTE  Dylan Moreno UYQ:[id number] DOB: 04-11-1940 DOA: 05/25/2014 PCP: Kandice Hams, MD  Assessment/Plan: 1. Acute delirium 1. Unclear etiology. Consideration for possible ETOH withdrawal. Pt is continued on CIWA 2. Head CT on admit was unremarkable 3. Also consider withdrawal from adderall as this was stopped abruptly recently 4. Also consider  5. Sitter at bedside 2. Chronic alcoholism 1. On CIWA per above 3. ADHD 1. Adderall restarted - reportedly had been stopped abruptly recently 4. Severe protein calorie malnutrition 1. Nutrition consulted 2. Cont with nutritional supplement as tolerated 5. Hypothyroid 1. Cont with thyroid replacement as tolerated 6. Hx CVA 1. Presenting head CT is unremarkable 7. Malignant tongue cancer 1. Followed by Oncology 8. DVT prophylaxis 1. Lovenox subQ  Code Status: Full Family Communication: Pt in room, called pt's son, [name] at [phone] Disposition Plan: Pending  Consultants:    Procedures:    Antibiotics:     HPI/Subjective: Pt sedate this AM, unable to obtain  Objective: Filed Vitals:   05/26/14 0100 05/26/14 0603 05/26/14 1521 05/26/14 1723  BP:  185/73  145/88  Pulse:  96  95  Temp:  98 F (36.7 C)  99 F (37.2 C)  TempSrc:  Oral  Oral  Resp:  22  24  Height: 6\' 1"  (1.854 m)     Weight:   80.377 kg (177 lb 3.2 oz)   SpO2:  97%  98%    Intake/Output Summary (Last 24 hours) at 05/26/14 1733 Last data filed at 05/26/14 1730  Gross per 24 hour  Intake   1095 ml  Output    725 ml  Net    370 ml   Filed Weights   05/26/14 1521  Weight: 80.377 kg (177 lb 3.2 oz)    Exam:   General:  Asleep, in nad, snoring  Cardiovascular: regular, s1, s2  Respiratory: normal resp effort, no wheezing  Abdomen: soft,nondistended  Musculoskeletal: perfused, no clubbing   Data Reviewed: Basic Metabolic Panel:  Recent Labs Lab 05/20/14 1447 05/24/14 1541 05/25/14 2042  05/26/14 0459  NA 134* 136  --  134*  K 4.8 4.8  --  4.5  CL  --   --   --  99*  CO2 24 24  --  25  GLUCOSE 110 122  --  101*  BUN 26.8* 25.9  --  26*  CREATININE 0.9 0.9 1.08 1.02  CALCIUM 8.8 8.9  --  9.3  MG 2.0  --   --   --    Liver Function Tests:  Recent Labs Lab 05/20/14 1447 05/24/14 1541 05/26/14 0459  AST 25 27 26   ALT 31 38 30  ALKPHOS 93 95 86  BILITOT 0.42 0.33 0.5  PROT 6.5 6.8 6.9  ALBUMIN 2.7* 2.9* 3.2*   No results for input(s): LIPASE, AMYLASE in the last 168 hours. No results for input(s): AMMONIA in the last 168 hours. CBC:  Recent Labs Lab 05/20/14 1446 05/24/14 1541 05/25/14 2042 05/26/14 0459  WBC 3.3* 1.9* 2.7* 2.9*  NEUTROABS 2.6 1.0*  --   --   HGB 9.8* 10.1* 9.8* 10.0*  HCT 28.8* 29.7* 28.9* 29.2*  MCV 87.0 86.6 87.6 86.9  PLT 298 368 401* 400   Cardiac Enzymes: No results for input(s): CKTOTAL, CKMB, CKMBINDEX, TROPONINI in the last 168 hours. BNP (last 3 results) No results for input(s): BNP in the last 8760 hours.  ProBNP (last 3 results) No results for input(s): PROBNP in the last 8760 hours.  CBG: No results for input(s): GLUCAP in the last 168 hours.  Recent Results (from the past 240 hour(s))  TECHNOLOGIST REVIEW     Status: None   Collection Time: 05/24/14  3:41 PM  Result Value Ref Range Status   Technologist Review 2% Myelocytes  Final     Studies: Ct Head Wo Contrast  05/25/2014   CLINICAL DATA:  Increasing confusion today, history of squamous cell carcinoma of the tongue  EXAM: CT HEAD WITHOUT CONTRAST  TECHNIQUE: Contiguous axial images were obtained from the base of the skull through the vertex without intravenous contrast.  COMPARISON:  12/29/2012  FINDINGS: Bony calvarium is intact. No gross soft tissue abnormality is noted. Prominence of the lateral ventricles is again identified and stable. No findings to suggest acute hemorrhage, acute infarction or space-occupying mass lesion are noted.  IMPRESSION: Stable  ventricular prominence.  No acute abnormality is noted.   Electronically Signed   By: Inez Catalina M.D.   On: 05/25/2014 16:35    Scheduled Meds: . amphetamine-dextroamphetamine  20 mg Oral BID WC  . antiseptic oral rinse  7 mL Mouth Rinse q12n4p  . aspirin  81 mg Oral Daily  . atorvastatin  20 mg Oral q1800  . chlorhexidine  15 mL Mouth Rinse BID  . clopidogrel  75 mg Oral Q breakfast  . enoxaparin (LOVENOX) injection  40 mg Subcutaneous Q24H  . feeding supplement (OSMOLITE 1.5 CAL)  474 mL Per Tube TID  . fentaNYL  25 mcg Transdermal Q72H  . folic acid  1 mg Oral Daily  . free water  250 mL Per Tube 3 times per day  . levothyroxine  75 mcg Oral QAC breakfast  . LORazepam  0-4 mg Intravenous Q6H   Followed by  . [START ON 05/27/2014] LORazepam  0-4 mg Intravenous Q12H  . multivitamin with minerals  1 tablet Oral Daily  . thiamine  100 mg Oral Daily   Or  . thiamine  100 mg Intravenous Daily   Continuous Infusions: . sodium chloride 75 mL/hr at 05/25/14 2324    Active Problems:   ADHD (attention deficit hyperactivity disorder)   Malignant neoplasm of base of tongue   Chronic alcoholism   Altered mental status   Eldin Bonsell, [facility] Pager [phone]. If 7PM-7AM, please contact night-coverage at [email or web address], password [facility] 05/26/2014, 5:33 PM  LOS: 1 day

## 2014-05-27 ENCOUNTER — Inpatient Hospital Stay (HOSPITAL_COMMUNITY): Payer: Medicare Other

## 2014-05-27 ENCOUNTER — Encounter (HOSPITAL_COMMUNITY): Payer: Self-pay | Admitting: Radiology

## 2014-05-27 ENCOUNTER — Ambulatory Visit: Payer: Self-pay

## 2014-05-27 LAB — BASIC METABOLIC PANEL
ANION GAP: 9 (ref 5–15)
BUN: 23 mg/dL — ABNORMAL HIGH (ref 6–20)
CO2: 25 mmol/L (ref 22–32)
Calcium: 9.1 mg/dL (ref 8.9–10.3)
Chloride: 97 mmol/L — ABNORMAL LOW (ref 101–111)
Creatinine, Ser: 0.98 mg/dL (ref 0.61–1.24)
GFR calc Af Amer: >60 mL/min (ref >60)
Glucose, Bld: 117 mg/dL — ABNORMAL HIGH (ref 65–99)
Potassium: 4.6 mmol/L (ref 3.5–5.1)
Sodium: 131 mmol/L — ABNORMAL LOW (ref 135–145)

## 2014-05-27 LAB — CBC
HEMATOCRIT: 30.5 % — AB (ref 39.0–52.0)
HEMOGLOBIN: 10.1 g/dL — AB (ref 13.0–17.0)
MCH: 28.3 pg (ref 26.0–34.0)
MCHC: 33.1 g/dL (ref 30.0–36.0)
MCV: 85.4 fL (ref 78.0–100.0)
Platelets: 426 10*3/uL — ABNORMAL HIGH (ref 150–400)
RBC: 3.57 MIL/uL — ABNORMAL LOW (ref 4.22–5.81)
RDW: 12.8 % (ref 11.5–15.5)
WBC: 5.4 10*3/uL (ref 4.0–10.5)

## 2014-05-27 LAB — GLUCOSE, CAPILLARY: Glucose-Capillary: 113 mg/dL — ABNORMAL HIGH (ref 65–99)

## 2014-05-27 LAB — VITAMIN B12: VITAMIN B 12: 758 pg/mL (ref 180–914)

## 2014-05-27 LAB — TSH: TSH: 2.987 u[IU]/mL (ref 0.350–4.500)

## 2014-05-27 LAB — AMMONIA: Ammonia: 20 umol/L (ref 9–35)

## 2014-05-27 LAB — FOLATE: FOLATE: 37 ng/mL (ref >5.9)

## 2014-05-27 MED ORDER — IOHEXOL 300 MG/ML  SOLN
100.0000 mL | Freq: Once | INTRAMUSCULAR | Status: AC | PRN
  Administered 2014-05-27: 100 mL via INTRAVENOUS

## 2014-05-27 NOTE — Progress Notes (Signed)
RT placed pt on room air aerosol face mask to provide some moisture and humidity for pt.  Pt oral mucosa is very dry due to Pt mouth breathing because of his swollen tongue from oral cancer.  Pt tolerating well at this time, RT to monitor and assess as needed.

## 2014-05-27 NOTE — Progress Notes (Signed)
Nutrition Follow-up  DOCUMENTATION CODES:  Severe malnutrition in context of chronic illness  INTERVENTION:  -Continue 2 cans of Osmolite 1.5 TID (10am, 2pm, 6pm).  -Flush with 120 ml free water before and after each bolus. -Recommend additional 250 ml of free water -RD to continue to monitor  NUTRITION DIAGNOSIS:  Malnutrition related to chronic illness as evidenced by severe depletion of muscle mass, percent weight loss.  Ongoing.  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 He also had PEG tube placed earlier this month, when all of his by mouth medications were discontinued. Patient has been getting tube feedings with Osmolite 1 can 6 times a day through the PEG tube. PMHx of alcohol abuse, quit drinking 10 weeks ago.  Pt receiving bolus feeds of Osmolite 1.5. Per RN, pt is tolerating TF well with no issues currently. RD will continue to monitor.  Labs reviewed: Low Na Elevated BUN CBGs: 113-139   Height:  Ht Readings from Last 1 Encounters:  05/26/14 6\' 1"  (1.854 m)    Weight:  Wt Readings from Last 1 Encounters:  05/26/14 177 lb 3.2 oz (80.377 kg)    Ideal Body Weight:  83.6 kg  Wt Readings from Last 10 Encounters:  05/26/14 177 lb 3.2 oz (80.377 kg)  05/24/14 171 lb 6.4 oz (77.747 kg)  05/10/14 184 lb (83.462 kg)  05/03/14 188 lb 6.4 oz (85.458 kg)  04/21/14 186 lb (84.369 kg)  04/21/14 185 lb 1.6 oz (83.961 kg)  04/05/14 190 lb (86.183 kg)  03/17/14 185 lb (83.915 kg)  08/04/13 186 lb 4 oz (84.482 kg)  05/04/13 191 lb 3 oz (86.722 kg)    BMI:  Body mass index is 23.38 kg/(m^2).  Estimated Nutritional Needs:  Kcal:  2924-4628  Protein:  115-125g  Fluid:  2.5L/day     Skin:   Reviewed, no issues  Diet Order:  Diet NPO time specified  EDUCATION NEEDS:  No education needs identified at this time   Intake/Output Summary (Last 24 hours) at 05/27/14 1240 Last data filed at 05/27/14 0600  Gross per 24 hour  Intake   2050 ml  Output   1125 ml  Net    925 ml    Last BM:  5/24  Clayton Bibles, MS, RD, LDN Pager: [phone] After Hours Pager: [phone]

## 2014-05-27 NOTE — Progress Notes (Signed)
[facility] PROGRESS NOTE  Dylan Moreno KVQ:[id number] DOB: 22-May-1940 DOA: 05/25/2014 PCP: Kandice Hams, MD  Assessment/Plan: 1. Acute delirium 1. Unclear etiology. Initial consideration for possible ETOH withdrawal, however pt has been without ETOH for nearly 10 weeks making ETOH withdrawal less likely. Pt is continued on CIWA. 2. Head CT w/o contrast on admit was unremarkable 3. Sitter at bedside 4. Head CT with contrast with worsening hydrocephalus. Chart reviewed and pt noted to have hx of known hydrocephalus. Consider Neurology input 5. Will check thiamine, folic acid, I43 levels given prior hx of ETOH abuse 2. Chronic alcoholism 1. On CIWA per above 3. ADHD 1. Adderall was restarted - reportedly had been stopped abruptly prior to admit 4. Severe protein calorie malnutrition 1. Nutrition consulted 2. Cont with nutritional supplement as tolerated 5. Hypothyroid 1. Cont with thyroid replacement as tolerated 2. TSH is w/in normal limits 6. Hx CVA 1. Presenting head non-contrast CT is unremarkable 7. Malignant tongue cancer 1. Followed by Oncology 2. CT neck w/ contrast demonstrates improvement following cancer tx 8. DVT prophylaxis 1. Lovenox subQ  Code Status: Full Family Communication: Pt in room, discussed with caregiver in room Disposition Plan: Pending  Consultants:    Procedures:    Antibiotics:     HPI/Subjective: Still confused, unable to obtain  Objective: Filed Vitals:   05/26/14 1723 05/26/14 2131 05/27/14 0603 05/27/14 1400  BP: 145/88 164/91 171/84 162/82  Pulse: 95 79 97 92  Temp: 99 F (37.2 C) 99.4 F (37.4 C) 99.1 F (37.3 C) 99.2 F (37.3 C)  TempSrc: Oral Axillary Axillary Axillary  Resp: 24 22 22 20   Height:      Weight:      SpO2: 98% 94% 97% 96%    Intake/Output Summary (Last 24 hours) at 05/27/14 1751 Last data filed at 05/27/14 0600  Gross per 24 hour  Intake   1450 ml  Output    550 ml  Net    900 ml    Filed Weights   05/26/14 1521  Weight: 80.377 kg (177 lb 3.2 oz)    Exam:   General:  Asleep, easily arousable, in nad, snoring  Cardiovascular: regular, s1, s2  Respiratory: normal resp effort, no wheezing, no crackles  Abdomen: soft,nondistended, pos BS  Musculoskeletal: perfused, no clubbing   Data Reviewed: Basic Metabolic Panel:  Recent Labs Lab 05/24/14 1541 05/25/14 2042 05/26/14 0459 05/27/14 0421  NA 136  --  134* 131*  K 4.8  --  4.5 4.6  CL  --   --  99* 97*  CO2 24  --  25 25  GLUCOSE 122  --  101* 117*  BUN 25.9  --  26* 23*  CREATININE 0.9 1.08 1.02 0.98  CALCIUM 8.9  --  9.3 9.1   Liver Function Tests:  Recent Labs Lab 05/24/14 1541 05/26/14 0459  AST 27 26  ALT 38 30  ALKPHOS 95 86  BILITOT 0.33 0.5  PROT 6.8 6.9  ALBUMIN 2.9* 3.2*   No results for input(s): LIPASE, AMYLASE in the last 168 hours. No results for input(s): AMMONIA in the last 168 hours. CBC:  Recent Labs Lab 05/24/14 1541 05/25/14 2042 05/26/14 0459 05/27/14 0421  WBC 1.9* 2.7* 2.9* 5.4  NEUTROABS 1.0*  --   --   --   HGB 10.1* 9.8* 10.0* 10.1*  HCT 29.7* 28.9* 29.2* 30.5*  MCV 86.6 87.6 86.9 85.4  PLT 368 401* 400 426*   Cardiac Enzymes: No results for  input(s): CKTOTAL, CKMB, CKMBINDEX, TROPONINI in the last 168 hours. BNP (last 3 results) No results for input(s): BNP in the last 8760 hours.  ProBNP (last 3 results) No results for input(s): PROBNP in the last 8760 hours.  CBG:  Recent Labs Lab 05/26/14 1952 05/27/14 0002  GLUCAP 139* 113*    Recent Results (from the past 240 hour(s))  TECHNOLOGIST REVIEW     Status: None   Collection Time: 05/24/14  3:41 PM  Result Value Ref Range Status   Technologist Review 2% Myelocytes  Final     Studies: Ct Head W Wo Contrast  05/27/2014   CLINICAL DATA:  Tongue cancer.  Tongue swelling.  EXAM: CT HEAD WITHOUT AND WITH CONTRAST  TECHNIQUE: Contiguous axial images were obtained from the base of the  skull through the vertex without and with intravenous contrast  CONTRAST:  133mL OMNIPAQUE IOHEXOL 300 MG/ML  SOLN  COMPARISON:  CT head 05/25/2014  FINDINGS: Moderate ventricular enlargement appears slightly larger compared with the prior study. Prominent aqueduct. 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  Negative for acute infarct.  Negative for hemorrhage or mass.  No enhancing lesions identified following contrast infusion  Negative for skull lesion  Image quality degraded by motion.  IMPRESSION: Ventricular enlargement slightly more prominent. Findings suggest communicating hydrocephalus  Negative for metastatic disease or acute infarct.   Electronically Signed   By: Franchot Gallo M.D.   On: 05/27/2014 16:18   Ct Soft Tissue Neck W Contrast  05/27/2014   CLINICAL DATA:  Squamous cell carcinoma tongue.  Tongue swelling.  EXAM: CT NECK WITH CONTRAST  TECHNIQUE: Multidetector CT imaging of the neck was performed using the standard protocol following the bolus administration of intravenous contrast.  CONTRAST:  168mL OMNIPAQUE IOHEXOL 300 MG/ML  SOLN  COMPARISON:  PET-CT 03/18/2014  FINDINGS: Pharynx and larynx: Prior PET scan revealed a mass in the base of tongue in the midline which was ulcerated. This appears improved without mass lesion in the area post treatment. Epiglottis and larynx are negative.  Salivary glands: Negative  Thyroid: Negative  Lymph nodes: Necrotic lymph node in the left mid mid neck is slightly smaller now measuring 3.9 x 2.5 mm. Posterior level 4 lymph node also smaller now measuring 16 mm. Right level 2 lymph node measures 11 mm slightly smaller. No new adenopathy.  Mild skin edema related to radiation treatment.  Vascular: Carotid artery patent bilaterally. Jugular vein patent bilaterally. Right-sided Port-A-Cath.  Limited intracranial: Negative  Visualized orbits: Not visualized  Mastoids and visualized paranasal sinuses: Mild mucosal edema right maxillary sinus. Mastoid sinus clear  bilaterally.  Skeleton: Cervical spondylosis.  No bony lesion.  Upper chest: Negative for mass or lung nodule.  IMPRESSION: Cervical adenopathy has improved since 03/18/2014 related to cancer treatment. Large necrotic node on the left is smaller. Additional posterior lymph node on the left is smaller as well. Small right level 2 lymph node slightly smaller. No new adenopathy  Tongue base mass difficult to see on the current study.   Electronically Signed   By: Franchot Gallo M.D.   On: 05/27/2014 15:58    Scheduled Meds: . amphetamine-dextroamphetamine  20 mg Oral BID WC  . antiseptic oral rinse  7 mL Mouth Rinse q12n4p  . aspirin  81 mg Oral Daily  . atorvastatin  20 mg Oral q1800  . chlorhexidine  15 mL Mouth Rinse BID  . clopidogrel  75 mg Oral Q breakfast  . enoxaparin (LOVENOX) injection  40 mg Subcutaneous Q24H  .  feeding supplement (OSMOLITE 1.5 CAL)  474 mL Per Tube TID  . fentaNYL  12.5 mcg Transdermal Q72H  . folic acid  1 mg Oral Daily  . free water  250 mL Per Tube 3 times per day  . levothyroxine  75 mcg Oral QAC breakfast  . LORazepam  0-4 mg Intravenous Q12H  . multivitamin with minerals  1 tablet Oral Daily  . thiamine  100 mg Oral Daily   Or  . thiamine  100 mg Intravenous Daily   Continuous Infusions: . sodium chloride 75 mL/hr at 05/27/14 0123    Active Problems:   ADHD (attention deficit hyperactivity disorder)   Malignant neoplasm of base of tongue   Chronic alcoholism   Altered mental status   Protein-calorie malnutrition, severe   Dylan Moreno K  [facility] Pager [phone]. If 7PM-7AM, please contact night-coverage at [email or web address], password [facility] 05/27/2014, 5:51 PM  LOS: 2 days

## 2014-05-27 NOTE — Progress Notes (Signed)
While suctioning pt, pt had trouble handling his secretions, and experienced respiratory distress. Pt's tongue is swollen and dry, pt is a mouth breather. Pt positioned upright with decrease in respiratory distress. Respiratory was called to assess pt, at this time pt's respiratory condition had improved. Rapid Response nurse came to assess, ear O2 probe was placed, pt at 94% on room air, sleeping peacefully.   0115 Pt was placed on aerosol at room air by respiratory to try to moisten his mouth and tongue to decrease swelling. This RN to continue to monitor. Noreene Larsson RN, BSN

## 2014-05-28 ENCOUNTER — Ambulatory Visit: Payer: Self-pay

## 2014-05-28 ENCOUNTER — Inpatient Hospital Stay (HOSPITAL_COMMUNITY): Payer: Medicare Other

## 2014-05-28 ENCOUNTER — Encounter: Payer: Self-pay | Admitting: *Deleted

## 2014-05-28 DIAGNOSIS — F900 Attention-deficit hyperactivity disorder, predominantly inattentive type: Secondary | ICD-10-CM

## 2014-05-28 LAB — GLUCOSE, CAPILLARY
GLUCOSE-CAPILLARY: 112 mg/dL — AB (ref 65–99)
Glucose-Capillary: 146 mg/dL — ABNORMAL HIGH (ref 65–99)
Glucose-Capillary: 113 mg/dL — ABNORMAL HIGH (ref 65–99)
Glucose-Capillary: 157 mg/dL — ABNORMAL HIGH (ref 65–99)

## 2014-05-28 MED ORDER — SCOPOLAMINE 1 MG/3DAYS TD PT72
1.0000 | MEDICATED_PATCH | TRANSDERMAL | Status: DC
  Administered 2014-05-28 – 2014-06-03 (×3): 1.5 mg via TRANSDERMAL
  Filled 2014-05-28 (×3): qty 1

## 2014-05-28 NOTE — Progress Notes (Signed)
[facility] PROGRESS NOTE  Dylan Moreno:[id number] DOB: 1940-06-22 DOA: 05/25/2014 PCP: Kandice Hams, MD  Assessment/Plan: 1. Acute delirium 1. Initial consideration for possible ETOH withdrawal, however pt has been without ETOH for nearly 10 weeks making ETOH withdrawal less likely. Pt has been continued on CIWA. 2. Head CT w/o contrast on admit was unremarkable 3. Follow up head CT with contrast showed worsening hydrocephalus. Have discussed case with Neurologist on call who does not feel CT findings are related to presenting issues. However, pt is recommended to have an outpatient Neurologic work up for NPH ultimately 4. Thiamine, folic acid, Y77 levels are unremarkable 5. Also consideration for polypharmacy with morphine on hold and fentanyl patch decreased to 12.5mg  6. Improving. Pt is now more conversant and seems more oriented 7. CXR was unremarkable per my own read 2. Chronic alcoholism 1. On CIWA per above, although doubt etoh withdrawal 3. ADHD 1. Adderall was resumed this admission 4. Severe protein calorie malnutrition 1. Nutrition was consulted 2. Cont with nutritional supplement as tolerated 5. Hypothyroid 1. Cont with thyroid replacement as tolerated 2. TSH is w/in normal limits 6. Hx CVA 1. Presenting head non-contrast CT is unremarkable 7. Malignant tongue cancer 1. Followed by Oncology and Rad Onc 2. CT neck w/ contrast demonstrates improvement in malignancy following recent radiation tx 8. DVT prophylaxis 1. Lovenox subQ  Code Status: Full Family Communication: Pt in room, updated pt's son, Dylan Moreno over phone Disposition Plan: Pending  Consultants:    Procedures:    Antibiotics:     HPI/Subjective: More awake but still confused so unable to obtain reliable subjective  Objective: Filed Vitals:   05/27/14 1400 05/27/14 2154 05/28/14 0613 05/28/14 1500  BP: 162/82 158/76 162/82 121/61  Pulse: 92 76 114 92  Temp: 99.2 F (37.3 C)  98.4 F (36.9 C) 98.8 F (37.1 C) 98.2 F (36.8 C)  TempSrc: Axillary Axillary Axillary Axillary  Resp: 20 20 20 20   Height:      Weight:      SpO2: 96%  96% 100%    Intake/Output Summary (Last 24 hours) at 05/28/14 1533 Last data filed at 05/28/14 1100  Gross per 24 hour  Intake 3670.5 ml  Output   1275 ml  Net 2395.5 ml   Filed Weights   05/26/14 1521  Weight: 80.377 kg (177 lb 3.2 oz)    Exam:   General:  Asleep, easily arousable, pt appears in NAD  Cardiovascular: regular, s1, s2  Respiratory: normal resp effort, no wheezing, no crackles  Abdomen: soft,nondistended, pos BS, nontender  Musculoskeletal: perfused, no clubbing   Data Reviewed: Basic Metabolic Panel:  Recent Labs Lab 05/24/14 1541 05/25/14 2042 05/26/14 0459 05/27/14 0421  NA 136  --  134* 131*  K 4.8  --  4.5 4.6  CL  --   --  99* 97*  CO2 24  --  25 25  GLUCOSE 122  --  101* 117*  BUN 25.9  --  26* 23*  CREATININE 0.9 1.08 1.02 0.98  CALCIUM 8.9  --  9.3 9.1   Liver Function Tests:  Recent Labs Lab 05/24/14 1541 05/26/14 0459  AST 27 26  ALT 38 30  ALKPHOS 95 86  BILITOT 0.33 0.5  PROT 6.8 6.9  ALBUMIN 2.9* 3.2*   No results for input(s): LIPASE, AMYLASE in the last 168 hours.  Recent Labs Lab 05/27/14 1953  AMMONIA 20   CBC:  Recent Labs Lab 05/24/14 1541 05/25/14 2042 05/26/14 0459  05/27/14 0421  WBC 1.9* 2.7* 2.9* 5.4  NEUTROABS 1.0*  --   --   --   HGB 10.1* 9.8* 10.0* 10.1*  HCT 29.7* 28.9* 29.2* 30.5*  MCV 86.6 87.6 86.9 85.4  PLT 368 401* 400 426*   Cardiac Enzymes: No results for input(s): CKTOTAL, CKMB, CKMBINDEX, TROPONINI in the last 168 hours. BNP (last 3 results) No results for input(s): BNP in the last 8760 hours.  ProBNP (last 3 results) No results for input(s): PROBNP in the last 8760 hours.  CBG:  Recent Labs Lab 05/26/14 1952 05/27/14 0002 05/27/14 2219 05/28/14 0208 05/28/14 0626  GLUCAP 139* 113* 146* 113* 112*    Recent  Results (from the past 240 hour(s))  TECHNOLOGIST REVIEW     Status: None   Collection Time: 05/24/14  3:41 PM  Result Value Ref Range Status   Technologist Review 2% Myelocytes  Final     Studies: Ct Head W Wo Contrast  05/27/2014   CLINICAL DATA:  Tongue cancer.  Tongue swelling.  EXAM: CT HEAD WITHOUT AND WITH CONTRAST  TECHNIQUE: Contiguous axial images were obtained from the base of the skull through the vertex without and with intravenous contrast  CONTRAST:  170mL OMNIPAQUE IOHEXOL 300 MG/ML  SOLN  COMPARISON:  CT head 05/25/2014  FINDINGS: Moderate ventricular enlargement appears slightly larger compared with the prior study. Prominent aqueduct. 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  Negative for acute infarct.  Negative for hemorrhage or mass.  No enhancing lesions identified following contrast infusion  Negative for skull lesion  Image quality degraded by motion.  IMPRESSION: Ventricular enlargement slightly more prominent. Findings suggest communicating hydrocephalus  Negative for metastatic disease or acute infarct.   Electronically Signed   By: Franchot Gallo M.D.   On: 05/27/2014 16:18   Ct Soft Tissue Neck W Contrast  05/27/2014   CLINICAL DATA:  Squamous cell carcinoma tongue.  Tongue swelling.  EXAM: CT NECK WITH CONTRAST  TECHNIQUE: Multidetector CT imaging of the neck was performed using the standard protocol following the bolus administration of intravenous contrast.  CONTRAST:  163mL OMNIPAQUE IOHEXOL 300 MG/ML  SOLN  COMPARISON:  PET-CT 03/18/2014  FINDINGS: Pharynx and larynx: Prior PET scan revealed a mass in the base of tongue in the midline which was ulcerated. This appears improved without mass lesion in the area post treatment. Epiglottis and larynx are negative.  Salivary glands: Negative  Thyroid: Negative  Lymph nodes: Necrotic lymph node in the left mid mid neck is slightly smaller now measuring 3.9 x 2.5 mm. Posterior level 4 lymph node also smaller now measuring 16 mm.  Right level 2 lymph node measures 11 mm slightly smaller. No new adenopathy.  Mild skin edema related to radiation treatment.  Vascular: Carotid artery patent bilaterally. Jugular vein patent bilaterally. Right-sided Port-A-Cath.  Limited intracranial: Negative  Visualized orbits: Not visualized  Mastoids and visualized paranasal sinuses: Mild mucosal edema right maxillary sinus. Mastoid sinus clear bilaterally.  Skeleton: Cervical spondylosis.  No bony lesion.  Upper chest: Negative for mass or lung nodule.  IMPRESSION: Cervical adenopathy has improved since 03/18/2014 related to cancer treatment. Large necrotic node on the left is smaller. Additional posterior lymph node on the left is smaller as well. Small right level 2 lymph node slightly smaller. No new adenopathy  Tongue base mass difficult to see on the current study.   Electronically Signed   By: Franchot Gallo M.D.   On: 05/27/2014 15:58   Dg Chest Port 1 View  05/28/2014  CLINICAL DATA:  Cough and shortness of breath.  Tongue cancer.  EXAM: PORTABLE CHEST - 1 VIEW  COMPARISON:  03/02/2014  FINDINGS: Right IJ Port-A-Cath has tip at the cavoatrial junction. Lungs are hypoinflated with mild linear density over the lateral left base and right midlung likely atelectasis. No pneumothorax. Minimal prominence of the perihilar vessels suggesting minimal vascular congestion. Cardiomediastinal silhouette is within normal. Remainder of the exam is unchanged.  IMPRESSION: Suggestion of minimal vascular congestion with mild linear atelectasis over the right midlung and left base.   Electronically Signed   By: Marin Olp M.D.   On: 05/28/2014 12:57    Scheduled Meds: . amphetamine-dextroamphetamine  20 mg Oral BID WC  . antiseptic oral rinse  7 mL Mouth Rinse q12n4p  . aspirin  81 mg Oral Daily  . atorvastatin  20 mg Oral q1800  . chlorhexidine  15 mL Mouth Rinse BID  . clopidogrel  75 mg Oral Q breakfast  . enoxaparin (LOVENOX) injection  40 mg  Subcutaneous Q24H  . feeding supplement (OSMOLITE 1.5 CAL)  474 mL Per Tube TID  . fentaNYL  12.5 mcg Transdermal Q72H  . folic acid  1 mg Oral Daily  . free water  250 mL Per Tube 3 times per day  . levothyroxine  75 mcg Oral QAC breakfast  . LORazepam  0-4 mg Intravenous Q12H  . multivitamin with minerals  1 tablet Oral Daily  . scopolamine  1 patch Transdermal Q72H  . thiamine  100 mg Oral Daily   Or  . thiamine  100 mg Intravenous Daily   Continuous Infusions: . sodium chloride 75 mL/hr at 05/28/14 6837    Active Problems:   ADHD (attention deficit hyperactivity disorder)   Malignant neoplasm of base of tongue   Chronic alcoholism   Altered mental status   Protein-calorie malnutrition, severe   [facility], [facility] Pager [phone]. If 7PM-7AM, please contact night-coverage at [email or web address], password [facility] 05/28/2014, 3:33 PM  LOS: 3 days

## 2014-05-28 NOTE — Care Management Note (Signed)
Case Management Note  Patient Details  Name: Dylan Moreno MRN: [id number] Date of Birth: [birth year]   Expected Discharge Date:   (unknown)               Expected Discharge Plan:  [place]  In-House Referral:     Discharge planning Services  CM Consult  Post Acute Care Choice:    Choice offered to:     DME Arranged:    DME Agency:     HH Arranged:    [place] Agency:     Status of Service:  In process, will continue to follow  Medicare Important Message Given:  Yes Date Medicare IM Given:  05/28/14 Medicare IM give by:  Marney Doctor RN,BSN,NCM Date Additional Medicare IM Given:    Additional Medicare Important Message give by:     If discussed at [place] of Stay Meetings, dates discussed:   This CM mentioned to MD that PT evaluation would be helpful with disposition planning. Pt lives with wife at home. Pt still not clear enough mentally to participate with PT per MD. CM will continue to follow. Additional Comments:  Lynnell Catalan, RN 05/28/2014, 1:25 PM

## 2014-05-28 NOTE — Progress Notes (Signed)
Oncology Nurse Navigator Documentation  Oncology Nurse Navigator Flowsheets 05/24/2014 05/25/2014 05/25/2014 05/25/2014 05/26/2014 05/26/2014 05/28/2014  Navigator Encounter Type Telephone Telephone Telephone Treatment Other Telephone Other  Patient Visit Type - - - Radonc Inpatient - Inpatient Checked on patient's well-being, [facility] 1345.  Spoke with his RN Legrand Rams.  Patient minimally responsive, barely opened his eyes when I spoke to him, no indication of recognizing me. Per RN:  Bilteral wrist restraints DC'd on Wed.  He is now wearing hand mits to minimize pulling on PEG.  Fentanyl, Haldol have been DC'd.    He occasionally actively moves in bed but otherwise is calm. I will continue to monitor during this admission.   Treatment Phase - - Other - - - -  Barriers/Navigation Needs - - - - - - -  Education - - - - - - -  Interventions - - Coordination of Care Coordination of Care - - -  Coordination of Care - - Other - - - -  Time Spent with Patient 15 15 30  90 [address] [place], Therapist, sports, Copywriter, advertising, [place] [facility] [place] [phone]

## 2014-05-29 DIAGNOSIS — F1021 Alcohol dependence, in remission: Secondary | ICD-10-CM

## 2014-05-29 DIAGNOSIS — R4020 Unspecified coma: Secondary | ICD-10-CM

## 2014-05-29 LAB — BASIC METABOLIC PANEL
Anion gap: 9 (ref 5–15)
BUN: 15 mg/dL (ref 6–20)
CO2: 24 mmol/L (ref 22–32)
Calcium: 8.5 mg/dL — ABNORMAL LOW (ref 8.9–10.3)
Chloride: 92 mmol/L — ABNORMAL LOW (ref 101–111)
Creatinine, Ser: 0.64 mg/dL (ref 0.61–1.24)
GFR calc Af Amer: >60 mL/min (ref >60)
GLUCOSE: 116 mg/dL — AB (ref 65–99)
POTASSIUM: 4 mmol/L (ref 3.5–5.1)
Sodium: 125 mmol/L — ABNORMAL LOW (ref 135–145)

## 2014-05-29 LAB — GLUCOSE, CAPILLARY
GLUCOSE-CAPILLARY: 90 mg/dL (ref 65–99)
GLUCOSE-CAPILLARY: 104 mg/dL — AB (ref 65–99)
GLUCOSE-CAPILLARY: 159 mg/dL — AB (ref 65–99)
Glucose-Capillary: 93 mg/dL (ref 65–99)
Glucose-Capillary: 203 mg/dL — ABNORMAL HIGH (ref 65–99)
Glucose-Capillary: 156 mg/dL — ABNORMAL HIGH (ref 65–99)

## 2014-05-29 LAB — CBC
HCT: 27.9 % — ABNORMAL LOW (ref 39.0–52.0)
HEMOGLOBIN: 9.7 g/dL — AB (ref 13.0–17.0)
MCH: 29 pg (ref 26.0–34.0)
MCHC: 34.8 g/dL (ref 30.0–36.0)
MCV: 83.3 fL (ref 78.0–100.0)
PLATELETS: 360 10*3/uL (ref 150–400)
RBC: 3.35 MIL/uL — ABNORMAL LOW (ref 4.22–5.81)
RDW: 12.6 % (ref 11.5–15.5)
WBC: 8.8 10*3/uL (ref 4.0–10.5)

## 2014-05-29 LAB — OSMOLALITY, URINE: OSMOLALITY UR: 245 mosm/kg — AB (ref 390–1090)

## 2014-05-29 LAB — SODIUM, URINE, RANDOM: SODIUM UR: 101 meq/L

## 2014-05-29 LAB — SODIUM
SODIUM: 129 mmol/L — AB (ref 135–145)
SODIUM: 129 mmol/L — AB (ref 135–145)

## 2014-05-29 MED ORDER — FUROSEMIDE 10 MG/ML IJ SOLN
40.0000 mg | Freq: Once | INTRAMUSCULAR | Status: AC
  Administered 2014-05-29: 40 mg via INTRAVENOUS
  Filled 2014-05-29: qty 4

## 2014-05-29 MED ORDER — LORAZEPAM 2 MG/ML IJ SOLN
1.0000 mg | Freq: Once | INTRAMUSCULAR | Status: AC
  Administered 2014-05-29: 1 mg via INTRAVENOUS

## 2014-05-29 NOTE — Progress Notes (Signed)
[facility] PROGRESS NOTE  KELL FERRIS WUG:[id number] DOB: 05-13-1940 DOA: 05/25/2014 PCP: Kandice Hams, MD  Assessment/Plan: 1. Acute delirium 1. Initial thought for possible ETOH withdrawal, however pt has been without ETOH for nearly 10 weeks making ETOH withdrawal less likely. Pt has been continued on CIWA. 2. Head CT w/o contrast on admit was unremarkable 3. Follow up head CT with contrast showed worsening hydrocephalus. Discussed case with Neurologist on call who did not feel CT findings are related to presenting mental status changes. However, pt is recommended to have an outpatient Neurologic work up for NPH ultimately 4. Thiamine, folic acid, U88 levels are unremarkable 5. Also consideration for polypharmacy with morphine on hold and fentanyl patch decreased to 12.5mg  6. CXR was unremarkable per my own read 7. Was improving, however noted to be more confused today, corresponding to worsened hyponatremia (see below) 2. Chronic alcoholism 1. On CIWA per above, although doubt etoh withdrawal 3. ADHD 1. Adderall was resumed this admission 4. Severe protein calorie malnutrition 1. Nutrition was consulted 2. Cont with nutritional supplement as tolerated 5. Hypothyroid 1. Cont with thyroid replacement as tolerated 2. TSH is w/in normal limits 6. Hx CVA 1. Presenting head non-contrast CT is unremarkable 2. Follow up CT head w/wo contrast unremarkable for acute process or new infarct 7. Malignant tongue cancer 1. Followed by Oncology and Rad Onc 2. CT neck w/ contrast had demonstrated improvement in malignancy following recent radiation tx 8. DVT prophylaxis 1. Lovenox subQ 9. Hyponatremia 1. Worsened hyponatremia of 125 today 2. Will hold NS fluids 3. Serum osm, urine osm, and urine sodium ordered, pending  Code Status: Full Family Communication: Pt in room, updated pt's son, [name] over phone Disposition Plan:  Pending  Consultants:    Procedures:    Antibiotics:     HPI/Subjective: Pt without complaints, albeit seems confused  Objective: Filed Vitals:   05/28/14 1500 05/28/14 2017 05/29/14 0446 05/29/14 1310  BP: 121/61 141/71 151/84 157/73  Pulse: 92 106 101 107  Temp: 98.2 F (36.8 C) 98.2 F (36.8 C) 98 F (36.7 C) 97.1 F (36.2 C)  TempSrc: Axillary Axillary Axillary Oral  Resp: 20 22 22 20   Height:      Weight:   79.924 kg (176 lb 3.2 oz)   SpO2: 100% 97% 97% 99%    Intake/Output Summary (Last 24 hours) at 05/29/14 1431 Last data filed at 05/29/14 1205  Gross per 24 hour  Intake 3857.75 ml  Output   3900 ml  Net -42.25 ml   Filed Weights   05/26/14 1521 05/29/14 0446  Weight: 80.377 kg (177 lb 3.2 oz) 79.924 kg (176 lb 3.2 oz)    Exam:   General:  Awake, in NAD, conversant but seems confused  Cardiovascular: regular, s1, s2  Respiratory: normal resp effort, no wheezing, no crackles  Abdomen: soft, pos BS, nontender  Musculoskeletal: perfused, no clubbing   Data Reviewed: Basic Metabolic Panel:  Recent Labs Lab 05/24/14 1541 05/25/14 2042 05/26/14 0459 05/27/14 0421 05/29/14 0623  NA 136  --  134* 131* 125*  K 4.8  --  4.5 4.6 4.0  CL  --   --  99* 97* 92*  CO2 24  --  25 25 24   GLUCOSE 122  --  101* 117* 116*  BUN 25.9  --  26* 23* 15  CREATININE 0.9 1.08 1.02 0.98 0.64  CALCIUM 8.9  --  9.3 9.1 8.5*   Liver Function Tests:  Recent Labs Lab 05/24/14  1541 05/26/14 0459  AST 27 26  ALT 38 30  ALKPHOS 95 86  BILITOT 0.33 0.5  PROT 6.8 6.9  ALBUMIN 2.9* 3.2*   No results for input(s): LIPASE, AMYLASE in the last 168 hours.  Recent Labs Lab 05/27/14 1953  AMMONIA 20   CBC:  Recent Labs Lab 05/24/14 1541 05/25/14 2042 05/26/14 0459 05/27/14 0421 05/29/14 0623  WBC 1.9* 2.7* 2.9* 5.4 8.8  NEUTROABS 1.0*  --   --   --   --   HGB 10.1* 9.8* 10.0* 10.1* 9.7*  HCT 29.7* 28.9* 29.2* 30.5* 27.9*  MCV 86.6 87.6 86.9 85.4  83.3  PLT 368 401* 400 426* 360   Cardiac Enzymes: No results for input(s): CKTOTAL, CKMB, CKMBINDEX, TROPONINI in the last 168 hours. BNP (last 3 results) No results for input(s): BNP in the last 8760 hours.  ProBNP (last 3 results) No results for input(s): PROBNP in the last 8760 hours.  CBG:  Recent Labs Lab 05/28/14 2005 05/29/14 0217 05/29/14 0445 05/29/14 0721 05/29/14 1144  GLUCAP 157* 90 93 104* 203*    Recent Results (from the past 240 hour(s))  TECHNOLOGIST REVIEW     Status: None   Collection Time: 05/24/14  3:41 PM  Result Value Ref Range Status   Technologist Review 2% Myelocytes  Final     Studies: Dg Chest Port 1 View  05/28/2014   CLINICAL DATA:  Cough and shortness of breath.  Tongue cancer.  EXAM: PORTABLE CHEST - 1 VIEW  COMPARISON:  03/02/2014  FINDINGS: Right IJ Port-A-Cath has tip at the cavoatrial junction. Lungs are hypoinflated with mild linear density over the lateral left base and right midlung likely atelectasis. No pneumothorax. Minimal prominence of the perihilar vessels suggesting minimal vascular congestion. Cardiomediastinal silhouette is within normal. Remainder of the exam is unchanged.  IMPRESSION: Suggestion of minimal vascular congestion with mild linear atelectasis over the right midlung and left base.   Electronically Signed   By: Marin Olp M.D.   On: 05/28/2014 12:57    Scheduled Meds: . amphetamine-dextroamphetamine  20 mg Oral BID WC  . antiseptic oral rinse  7 mL Mouth Rinse q12n4p  . aspirin  81 mg Oral Daily  . atorvastatin  20 mg Oral q1800  . chlorhexidine  15 mL Mouth Rinse BID  . clopidogrel  75 mg Oral Q breakfast  . enoxaparin (LOVENOX) injection  40 mg Subcutaneous Q24H  . feeding supplement (OSMOLITE 1.5 CAL)  474 mL Per Tube TID  . fentaNYL  12.5 mcg Transdermal Q72H  . folic acid  1 mg Oral Daily  . free water  250 mL Per Tube 3 times per day  . levothyroxine  75 mcg Oral QAC breakfast  . LORazepam  0-4 mg  Intravenous Q12H  . multivitamin with minerals  1 tablet Oral Daily  . scopolamine  1 patch Transdermal Q72H  . thiamine  100 mg Oral Daily   Or  . thiamine  100 mg Intravenous Daily   Continuous Infusions:    Active Problems:   ADHD (attention deficit hyperactivity disorder)   Malignant neoplasm of base of tongue   Chronic alcoholism   Altered mental status   Protein-calorie malnutrition, severe   Kortni Hasten K  [facility] Pager [phone]. If 7PM-7AM, please contact night-coverage at [email or web address], password [facility] 05/29/2014, 2:31 PM  LOS: 4 days

## 2014-05-30 DIAGNOSIS — R220 Localized swelling, mass and lump, head: Secondary | ICD-10-CM

## 2014-05-30 DIAGNOSIS — E871 Hypo-osmolality and hyponatremia: Secondary | ICD-10-CM | POA: Insufficient documentation

## 2014-05-30 LAB — COMPREHENSIVE METABOLIC PANEL
ALT: 47 U/L (ref 17–63)
ANION GAP: 9 (ref 5–15)
AST: 40 U/L (ref 15–41)
Albumin: 2.8 g/dL — ABNORMAL LOW (ref 3.5–5.0)
Alkaline Phosphatase: 96 U/L (ref 38–126)
BUN: 19 mg/dL (ref 6–20)
CO2: 26 mmol/L (ref 22–32)
CREATININE: 0.71 mg/dL (ref 0.61–1.24)
Calcium: 8.8 mg/dL — ABNORMAL LOW (ref 8.9–10.3)
Chloride: 92 mmol/L — ABNORMAL LOW (ref 101–111)
GFR calc Af Amer: >60 mL/min (ref >60)
GFR calc non Af Amer: >60 mL/min (ref >60)
GLUCOSE: 122 mg/dL — AB (ref 65–99)
Potassium: 4 mmol/L (ref 3.5–5.1)
Sodium: 127 mmol/L — ABNORMAL LOW (ref 135–145)
TOTAL PROTEIN: 6.6 g/dL (ref 6.5–8.1)
Total Bilirubin: 0.6 mg/dL (ref 0.3–1.2)

## 2014-05-30 LAB — GLUCOSE, CAPILLARY
GLUCOSE-CAPILLARY: 100 mg/dL — AB (ref 65–99)
GLUCOSE-CAPILLARY: 123 mg/dL — AB (ref 65–99)
GLUCOSE-CAPILLARY: 117 mg/dL — AB (ref 65–99)
GLUCOSE-CAPILLARY: 139 mg/dL — AB (ref 65–99)
Glucose-Capillary: 110 mg/dL — ABNORMAL HIGH (ref 65–99)

## 2014-05-30 LAB — SODIUM
SODIUM: 129 mmol/L — AB (ref 135–145)
Sodium: 128 mmol/L — ABNORMAL LOW (ref 135–145)

## 2014-05-30 MED ORDER — GLUCERNA 1.2 CAL PO LIQD
474.0000 mL | ORAL | Status: DC
  Administered 2014-05-30 – 2014-06-03 (×12): 474 mL
  Filled 2014-05-30 (×16): qty 474

## 2014-05-30 MED ORDER — PRO-STAT SUGAR FREE PO LIQD
30.0000 mL | Freq: Three times a day (TID) | ORAL | Status: DC
  Administered 2014-05-30 – 2014-06-03 (×13): 30 mL
  Filled 2014-05-30 (×15): qty 30

## 2014-05-30 NOTE — Progress Notes (Signed)
[facility] PROGRESS NOTE  TANIELA FELTUS MWN:[id number] DOB: 03-31-1940 DOA: 05/25/2014 PCP: Kandice Hams, MD  Assessment/Plan: 1. Acute delirium 1. Initial thought for possible ETOH withdrawal, however pt has been without ETOH for nearly 10 weeks making ETOH withdrawal less likely. Pt had been continued on CIWA. 2. Head CT w/o contrast on admit was unremarkable 3. Follow up head CT with contrast showed worsening hydrocephalus. 4. Wife at bedside reports pt having unsteady/shuffling gait over the past 1-2 weeks prior to admission. Had recently discussed case with Neurologist on call who did not feel CT findings are related to presenting mental status changes. However, pt is strongly recommended to have an outpatient Neurologic work up for NPH when discharged 5. Thiamine, folic acid, Q03 levels are unremarkable 6. Consideration made for polypharmacy as pt's mentation is showing improvement after fentanyl patch weaned to off 2. Chronic alcoholism 1. On CIWA per above, although doubt etoh withdrawal 3. ADHD 1. Adderall was resumed this admission 4. Severe protein calorie malnutrition 1. Nutrition was consulted 2. Cont with nutritional supplement as tolerated 5. Hypothyroid 1. Cont with thyroid replacement as tolerated 2. TSH is w/in normal limits 6. Hx CVA 1. Presenting head non-contrast CT is unremarkable 2. Follow up CT head w/wo contrast unremarkable for acute process or new infarct 7. Malignant tongue cancer 1. Followed by Oncology and Rad Onc 2. CT neck w/ contrast had demonstrated improvement in malignancy following recent radiation tx 8. DVT prophylaxis 1. Lovenox subQ 9. Hyponatremia 1. Hyponatremia in the mid-120's 2. Holding NS fluids 3. Pt with low serum and urine osm 4. Discussed with Pharmacy. Will decrease amount of free water flush per PEG 5. Cont to follow serial Na 6. No indication for hypertonic saline  Code Status: Full Family Communication: Pt in  room, updated pt's wife at bedside Disposition Plan: Pending  Consultants:    Procedures:    Antibiotics:     HPI/Subjective: Pt reports back pain. No other complaints  Objective: Filed Vitals:   05/29/14 1310 05/29/14 2029 05/30/14 0541 05/30/14 1320  BP: 157/73 144/73 142/75 143/68  Pulse: 107 103 103 89  Temp: 97.1 F (36.2 C) 99 F (37.2 C) 98.5 F (36.9 C) 97.1 F (36.2 C)  TempSrc: Oral Axillary Axillary Axillary  Resp: 20 20 20 20   Height:      Weight:   85.276 kg (188 lb)   SpO2: 99% 97% 96% 95%    Intake/Output Summary (Last 24 hours) at 05/30/14 1700 Last data filed at 05/30/14 1430  Gross per 24 hour  Intake   1094 ml  Output   1800 ml  Net   -706 ml   Filed Weights   05/26/14 1521 05/29/14 0446 05/30/14 0541  Weight: 80.377 kg (177 lb 3.2 oz) 79.924 kg (176 lb 3.2 oz) 85.276 kg (188 lb)    Exam:   General:  Awake, more oriented, conversant, in nad  Cardiovascular: regular, s1, s2  Respiratory: normal resp effort, no wheezing  Abdomen: soft, pos BS  Musculoskeletal: perfused, no clubbing, no cyanosis, "mitt" restraints in place  Data Reviewed: Basic Metabolic Panel:  Recent Labs Lab 05/24/14 1541 05/25/14 2042  05/26/14 0459 05/27/14 0421 05/29/14 0623 05/29/14 1505 05/29/14 1855 05/30/14 0500  NA 136  --   < > 134* 131* 125* 129* 129* 127*  K 4.8  --   --  4.5 4.6 4.0  --   --  4.0  CL  --   --   --  99*  97* 92*  --   --  92*  CO2 24  --   --  25 25 24   --   --  26  GLUCOSE 122  --   --  101* 117* 116*  --   --  122*  BUN 25.9  --   --  26* 23* 15  --   --  19  CREATININE 0.9 1.08  --  1.02 0.98 0.64  --   --  0.71  CALCIUM 8.9  --   --  9.3 9.1 8.5*  --   --  8.8*  < > = values in this interval not displayed. Liver Function Tests:  Recent Labs Lab 05/24/14 1541 05/26/14 0459 05/30/14 0500  AST 27 26 40  ALT 38 30 47  ALKPHOS 95 86 96  BILITOT 0.33 0.5 0.6  PROT 6.8 6.9 6.6  ALBUMIN 2.9* 3.2* 2.8*   No results  for input(s): LIPASE, AMYLASE in the last 168 hours.  Recent Labs Lab 05/27/14 1953  AMMONIA 20   CBC:  Recent Labs Lab 05/24/14 1541 05/25/14 2042 05/26/14 0459 05/27/14 0421 05/29/14 0623  WBC 1.9* 2.7* 2.9* 5.4 8.8  NEUTROABS 1.0*  --   --   --   --   HGB 10.1* 9.8* 10.0* 10.1* 9.7*  HCT 29.7* 28.9* 29.2* 30.5* 27.9*  MCV 86.6 87.6 86.9 85.4 83.3  PLT 368 401* 400 426* 360   Cardiac Enzymes: No results for input(s): CKTOTAL, CKMB, CKMBINDEX, TROPONINI in the last 168 hours. BNP (last 3 results) No results for input(s): BNP in the last 8760 hours.  ProBNP (last 3 results) No results for input(s): PROBNP in the last 8760 hours.  CBG:  Recent Labs Lab 05/29/14 1541 05/29/14 2159 05/30/14 0406 05/30/14 0733 05/30/14 1150  GLUCAP 156* 159* 110* 100* 123*    Recent Results (from the past 240 hour(s))  TECHNOLOGIST REVIEW     Status: None   Collection Time: 05/24/14  3:41 PM  Result Value Ref Range Status   Technologist Review 2% Myelocytes  Final     Studies: No results found.  Scheduled Meds: . amphetamine-dextroamphetamine  20 mg Oral BID WC  . antiseptic oral rinse  7 mL Mouth Rinse q12n4p  . aspirin  81 mg Oral Daily  . atorvastatin  20 mg Oral q1800  . chlorhexidine  15 mL Mouth Rinse BID  . clopidogrel  75 mg Oral Q breakfast  . enoxaparin (LOVENOX) injection  40 mg Subcutaneous Q24H  . feeding supplement (GLUCERNA 1.2 CAL)  474 mL Per Tube 3 times per day  . feeding supplement (PRO-STAT SUGAR FREE 64)  30 mL Per Tube TID  . folic acid  1 mg Oral Daily  . levothyroxine  75 mcg Oral QAC breakfast  . multivitamin with minerals  1 tablet Oral Daily  . scopolamine  1 patch Transdermal Q72H  . thiamine  100 mg Oral Daily   Or  . thiamine  100 mg Intravenous Daily   Continuous Infusions:    Active Problems:   ADHD (attention deficit hyperactivity disorder)   Malignant neoplasm of base of tongue   Chronic alcoholism   Altered mental status    Protein-calorie malnutrition, severe   Georgeann Brinkman K  [facility] Pager [phone]. If 7PM-7AM, please contact night-coverage at [email or web address], password [facility] 05/30/2014, 5:00 PM  LOS: 5 days

## 2014-05-30 NOTE — Progress Notes (Signed)
Nutrition Follow-up  DOCUMENTATION CODES:  Severe malnutrition in context of chronic illness  INTERVENTION:  -Continue 2 cans of Glucerna 1.2 TID (10am, 2pm, 6pm).  -Flush with 120 ml free water before and after each bolus. -Provide Prostat liquid protein PO 30 ml TID, each supplement provides 100 kcal, 15 grams protein. -Recommend additional 250 ml of free water -Tube feeding regimen provides 2010 (87% estimated needs), 130 g of protein and 2122 ml of free water. -RD to continue to monitor   NUTRITION DIAGNOSIS:  Malnutrition related to chronic illness as evidenced by severe depletion of muscle mass, percent weight loss.  Ongoing.  GOAL:  Patient will meet greater than or equal to 90% of their needs  Unmet.  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 He also had PEG tube placed earlier this month, when all of his by mouth medications were discontinued. Patient has been getting tube feedings with Osmolite 1 can 6 times a day through the PEG tube. PMHx of alcohol abuse, quit drinking 10 weeks ago.  Pt with hyponatremia, per pharmacy note, MD requested formula change for TF. Pharmacy changed formula from Osmolite 1.5 to Glucerna 1.2, 6 cans daily which does not meet his estimated nutritional needs. RD to order Prostat TID to meet 100% of pt's protein needs.   Labs reviewed: Low Na  Height:  Ht Readings from Last 1 Encounters:  05/26/14 6\' 1"  (1.854 m)    Weight:  Wt Readings from Last 1 Encounters:  05/30/14 188 lb (85.276 kg)    Ideal Body Weight:  83.6 kg  Wt Readings from Last 10 Encounters:  05/30/14 188 lb (85.276 kg)  05/24/14 171 lb 6.4 oz (77.747 kg)  05/10/14 184 lb (83.462 kg)  05/03/14 188 lb 6.4 oz (85.458 kg)  04/21/14 186 lb (84.369 kg)  04/21/14 185 lb 1.6 oz (83.961 kg)  04/05/14  190 lb (86.183 kg)  03/17/14 185 lb (83.915 kg)  08/04/13 186 lb 4 oz (84.482 kg)  05/04/13 191 lb 3 oz (86.722 kg)    BMI:  Body mass index is 24.81 kg/(m^2).  Estimated Nutritional Needs:  Kcal:  2836-6294  Protein:  115-125g  Fluid:  2.5L/day    Skin:  Reviewed, no issues  Diet Order:  Diet NPO time specified  EDUCATION NEEDS:  No education needs identified at this time   Intake/Output Summary (Last 24 hours) at 05/30/14 1257 Last data filed at 05/30/14 0900  Gross per 24 hour  Intake   1818 ml  Output   2050 ml  Net   -232 ml    Last BM:  5/28  Clayton Bibles, MS, RD, LDN Pager: [phone] After Hours Pager: [phone]

## 2014-05-30 NOTE — Progress Notes (Signed)
Dr. Wyline Copas requested switching to a more concentrated tube feeding.   Currently on Osmolite 1.5 with an osmolality of 58mOsm/kg Will switch to Glucerna 1.2 with an osmolality of 750mOsm/kg  This will deliver less carbohydrates and more fats.  Overall calorie intake will change from 2133 to 1706 Kcal per day. Dietician can follow up and offer further suggestions.  Romeo Rabon, PharmD, pager [phone]. 05/30/2014,7:55 AM.

## 2014-05-31 LAB — CBC
HEMATOCRIT: 29 % — AB (ref 39.0–52.0)
Hemoglobin: 10.2 g/dL — ABNORMAL LOW (ref 13.0–17.0)
MCH: 29.6 pg (ref 26.0–34.0)
MCHC: 35.2 g/dL (ref 30.0–36.0)
MCV: 84.1 fL (ref 78.0–100.0)
Platelets: 397 10*3/uL (ref 150–400)
RBC: 3.45 MIL/uL — AB (ref 4.22–5.81)
RDW: 12.9 % (ref 11.5–15.5)
WBC: 7.1 10*3/uL (ref 4.0–10.5)

## 2014-05-31 LAB — COMPREHENSIVE METABOLIC PANEL
ALT: 66 U/L — ABNORMAL HIGH (ref 17–63)
ANION GAP: 10 (ref 5–15)
AST: 49 U/L — ABNORMAL HIGH (ref 15–41)
Albumin: 2.8 g/dL — ABNORMAL LOW (ref 3.5–5.0)
Alkaline Phosphatase: 106 U/L (ref 38–126)
BUN: 23 mg/dL — ABNORMAL HIGH (ref 6–20)
CALCIUM: 9.2 mg/dL (ref 8.9–10.3)
CO2: 25 mmol/L (ref 22–32)
Chloride: 94 mmol/L — ABNORMAL LOW (ref 101–111)
Creatinine, Ser: 0.61 mg/dL (ref 0.61–1.24)
GFR calc non Af Amer: >60 mL/min (ref >60)
GLUCOSE: 121 mg/dL — AB (ref 65–99)
POTASSIUM: 4.3 mmol/L (ref 3.5–5.1)
Sodium: 129 mmol/L — ABNORMAL LOW (ref 135–145)
Total Bilirubin: 0.4 mg/dL (ref 0.3–1.2)
Total Protein: 6.7 g/dL (ref 6.5–8.1)

## 2014-05-31 LAB — GLUCOSE, CAPILLARY
GLUCOSE-CAPILLARY: 120 mg/dL — AB (ref 65–99)
GLUCOSE-CAPILLARY: 126 mg/dL — AB (ref 65–99)
Glucose-Capillary: 106 mg/dL — ABNORMAL HIGH (ref 65–99)
Glucose-Capillary: 127 mg/dL — ABNORMAL HIGH (ref 65–99)
Glucose-Capillary: 118 mg/dL — ABNORMAL HIGH (ref 65–99)

## 2014-05-31 LAB — SODIUM: SODIUM: 129 mmol/L — AB (ref 135–145)

## 2014-05-31 LAB — CORTISOL: CORTISOL PLASMA: 16.6 ug/dL

## 2014-05-31 LAB — OSMOLALITY: OSMOLALITY: 264 mosm/kg — AB (ref 275–300)

## 2014-05-31 MED ORDER — FUROSEMIDE 10 MG/ML IJ SOLN
40.0000 mg | Freq: Once | INTRAMUSCULAR | Status: AC
  Administered 2014-05-31: 40 mg via INTRAVENOUS
  Filled 2014-05-31: qty 4

## 2014-05-31 MED ORDER — COSYNTROPIN 0.25 MG IJ SOLR
0.2500 mg | Freq: Once | INTRAMUSCULAR | Status: AC
  Administered 2014-06-01: 0.25 mg via INTRAVENOUS
  Filled 2014-05-31: qty 0.25

## 2014-05-31 MED ORDER — LORAZEPAM 2 MG/ML IJ SOLN
1.0000 mg | Freq: Once | INTRAMUSCULAR | Status: AC
  Administered 2014-05-31: 1 mg via INTRAVENOUS
  Filled 2014-05-31: qty 1

## 2014-05-31 NOTE — Progress Notes (Signed)
[facility] PROGRESS NOTE  Dylan Moreno WCB:[id number] DOB: 27-May-1940 DOA: 05/25/2014 PCP: Kandice Hams, MD  Assessment/Plan: 1. Acute delirium 1. Patient was initially thought to have possible ETOH withdrawal, however pt has been without ETOH for nearly 10 weeks making ETOH withdrawal less likely. Pt had been continued on CIWA. 2. Head CT w/o contrast on admit was unremarkable 3. Follow up head CT with contrast showed worsening hydrocephalus. 4. Recommended to have an outpatient Neurologic work up for NPH when discharged 5. Thiamine, folic acid, O16 levels were unremarkable 6. Suspecting polypharmacy as pt's mentation is showing marked improvement after fentanyl patch was weaned to off 2. Chronic alcoholism 1. On CIWA per above, although doubt etoh withdrawal 3. ADHD 1. Adderall was resumed this admission 4. Severe protein calorie malnutrition 1. Nutrition was consulted 2. Cont with nutritional supplement as tolerated 5. Hypothyroid 1. Cont with thyroid replacement as tolerated 2. TSH is w/in normal limits 6. Hx CVA 1. Presenting head non-contrast CT is unremarkable 2. Follow up CT head w/wo contrast unremarkable for acute process or new infarct 7. Malignant tongue cancer 1. Followed by Oncology and Rad Onc 2. CT neck w/ contrast had demonstrated some improvement in pt's malignancy following recent radiation tx 8. DVT prophylaxis 1. Lovenox subQ 9. Hyponatremia 1. Hyponatremia in the upper-120's 2. Pt with low serum and urine osm with elevated urine Na 3. IVF on hold 4. Minimizing amount of free water flush per PEG 5. Cont to follow serial Na 6. No indication for hypertonic saline 7. Will check cosyntropin stim test in AM to r/o adrenal insufficiency  Code Status: Full Family Communication: Pt in room, updated pt's wife at bedside Disposition Plan: Pending  Consultants:    Procedures:    Antibiotics:     HPI/Subjective: No complaints.    Objective: Filed Vitals:   05/30/14 1320 05/30/14 2017 05/31/14 0526 05/31/14 1349  BP: 143/68 155/81 182/81 127/78  Pulse: 89 100 97 99  Temp: 97.1 F (36.2 C) 98.5 F (36.9 C) 98 F (36.7 C) 97.6 F (36.4 C)  TempSrc: Axillary Axillary Axillary Axillary  Resp: 20 22 20 20   Height:      Weight:   85.276 kg (188 lb)   SpO2: 95% 98% 98% 97%    Intake/Output Summary (Last 24 hours) at 05/31/14 1619 Last data filed at 05/31/14 1608  Gross per 24 hour  Intake     90 ml  Output   2900 ml  Net  -2810 ml   Filed Weights   05/29/14 0446 05/30/14 0541 05/31/14 0526  Weight: 79.924 kg (176 lb 3.2 oz) 85.276 kg (188 lb) 85.276 kg (188 lb)    Exam:   General:  Awake, conversant, more oriented, in nad  Cardiovascular: regular, s1, s2  Respiratory: normal resp effort, no wheezing  Abdomen: soft, pos BS  Musculoskeletal: perfused, no clubbing, no cyanosis, mitt restrants removed Data Reviewed: Basic Metabolic Panel:  Recent Labs Lab 05/26/14 0459 05/27/14 0421 05/29/14 0623  05/30/14 0500 05/30/14 1840 05/30/14 2300 05/31/14 0245 05/31/14 0600  NA 134* 131* 125*  < > 127* 129* 128* 129* 129*  K 4.5 4.6 4.0  --  4.0  --   --   --  4.3  CL 99* 97* 92*  --  92*  --   --   --  94*  CO2 25 25 24   --  26  --   --   --  25  GLUCOSE 101* 117* 116*  --  122*  --   --   --  121*  BUN 26* 23* 15  --  19  --   --   --  23*  CREATININE 1.02 0.98 0.64  --  0.71  --   --   --  0.61  CALCIUM 9.3 9.1 8.5*  --  8.8*  --   --   --  9.2  < > = values in this interval not displayed. Liver Function Tests:  Recent Labs Lab 05/26/14 0459 05/30/14 0500 05/31/14 0600  AST 26 40 49*  ALT 30 47 66*  ALKPHOS 86 96 106  BILITOT 0.5 0.6 0.4  PROT 6.9 6.6 6.7  ALBUMIN 3.2* 2.8* 2.8*   No results for input(s): LIPASE, AMYLASE in the last 168 hours.  Recent Labs Lab 05/27/14 1953  AMMONIA 20   CBC:  Recent Labs Lab 05/25/14 2042 05/26/14 0459 05/27/14 0421 05/29/14 0623  05/31/14 0600  WBC 2.7* 2.9* 5.4 8.8 7.1  HGB 9.8* 10.0* 10.1* 9.7* 10.2*  HCT 28.9* 29.2* 30.5* 27.9* 29.0*  MCV 87.6 86.9 85.4 83.3 84.1  PLT 401* 400 426* 360 397   Cardiac Enzymes: No results for input(s): CKTOTAL, CKMB, CKMBINDEX, TROPONINI in the last 168 hours. BNP (last 3 results) No results for input(s): BNP in the last 8760 hours.  ProBNP (last 3 results) No results for input(s): PROBNP in the last 8760 hours.  CBG:  Recent Labs Lab 05/30/14 1749 05/30/14 2129 05/31/14 0134 05/31/14 0654 05/31/14 1233  GLUCAP 117* 139* 120* 106* 127*    Recent Results (from the past 240 hour(s))  TECHNOLOGIST REVIEW     Status: None   Collection Time: 05/24/14  3:41 PM  Result Value Ref Range Status   Technologist Review 2% Myelocytes  Final     Studies: No results found.  Scheduled Meds: . amphetamine-dextroamphetamine  20 mg Oral BID WC  . antiseptic oral rinse  7 mL Mouth Rinse q12n4p  . aspirin  81 mg Oral Daily  . atorvastatin  20 mg Oral q1800  . chlorhexidine  15 mL Mouth Rinse BID  . clopidogrel  75 mg Oral Q breakfast  . [START ON 06/01/2014] cosyntropin  0.25 mg Intravenous Once  . enoxaparin (LOVENOX) injection  40 mg Subcutaneous Q24H  . feeding supplement (GLUCERNA 1.2 CAL)  474 mL Per Tube 3 times per day  . feeding supplement (PRO-STAT SUGAR FREE 64)  30 mL Per Tube TID  . folic acid  1 mg Oral Daily  . levothyroxine  75 mcg Oral QAC breakfast  . multivitamin with minerals  1 tablet Oral Daily  . scopolamine  1 patch Transdermal Q72H  . thiamine  100 mg Oral Daily   Or  . thiamine  100 mg Intravenous Daily   Continuous Infusions:    Active Problems:   ADHD (attention deficit hyperactivity disorder)   Malignant neoplasm of base of tongue   Chronic alcoholism   Altered mental status   Protein-calorie malnutrition, severe   Tongue swelling   Hyponatremia   Dylan Moreno K  [facility] Pager [phone]. If 7PM-7AM, please contact  night-coverage at [email or web address], password [facility] 05/31/2014, 4:19 PM  LOS: 6 days

## 2014-05-31 NOTE — Progress Notes (Signed)
Pt agitated, wanting to leave hospital. Pt's friend Debbie,was notified first as requested. She talked to him on the phone. After that patient decided that he needed to leave. He kept trying to throw his legs over the side rail. [facility] on call notified and orders received for medication. Pt's nurse went out of the room to get medication for agitation and pt became verbally abusive to staff in the room. Pt medicated with 1mg  of Ativan as ordered. Will cont to monitor him.

## 2014-05-31 NOTE — Evaluation (Signed)
Physical Therapy Evaluation Patient Details Name: Dylan Moreno MRN: [id number] DOB: [birth year] Today's Date: 05/31/2014   History of Present Illness  74 year old male adm with AMS on 05/25/14; past medical history of Hyperlipemia; Arthritis; ADHD, Hypercholesterolemia; Peyronie's disease; Kidney stones; TIA,  Hypertension; Anxiety; GERD (gastroesophageal reflux disease); CA  of base of tongue (03/05/14);   Clinical Impression  Pt admitted with above diagnosis. Pt currently with functional limitations due to the deficits listed below (see PT Problem List). Pt will benefit from skilled PT to increase their independence and safety with mobility to allow discharge to the venue listed below.  Will follow for needs, no family present but per RN pt was very I at baseline; pt reports independence;   Eval limited today by  Pt mental status     Follow Up Recommendations SNF    Equipment Recommendations  Other (comment) (TBA)    Recommendations for Other Services       Precautions / Restrictions Precautions Precautions: Fall Precaution Comments: PEG tube/ ABD binder      Mobility  Bed Mobility Overal bed mobility: Needs Assistance;+2 for physical assistance;+ 2 for safety/equipment Bed Mobility: Supine to Sit;Sit to Supine     Supine to sit: +2 for physical assistance;Min assist;+2 for safety/equipment Sit to supine: Mod assist;+2 for physical assistance;+2 for safety/equipment   General bed mobility comments: pt requires step by step and hand over hand cues to assist self  Transfers                 General transfer comment: deferred d/t pt at risk for falls, confused adn having difficulty following commands consistently  Ambulation/Gait                Stairs            Wheelchair Mobility    Modified Rankin (Stroke Patients Only)       Balance Overall balance assessment: Needs assistance Sitting-balance support: Bilateral upper extremity supported;No  upper extremity supported;Feet supported Sitting balance-Leahy Scale: Poor Sitting balance - Comments: varying form min to max assist in sitting; LOB anteriorly, tactile cues to correct                                     Pertinent Vitals/Pain Pain Assessment: No/denies pain    Home Living Family/patient expects to be discharged to:: Private residence Living Arrangements: Spouse/significant other Available Help at Discharge: Family             Additional Comments: no family present, pt unable to give any info    Prior Function                 Hand Dominance        Extremity/Trunk Assessment   Upper Extremity Assessment: Defer to OT evaluation           Lower Extremity Assessment: Overall WFL for tasks assessed (pt able to to lift LEs, bridge in supine; AROM WFL per obs)      Cervical / Trunk Assessment: Kyphotic (in siting, forward head)  Communication   Communication: No difficulties  Cognition Arousal/Alertness: Awake/alert Behavior During Therapy: Restless Overall Cognitive Status: Impaired/Different from baseline Area of Impairment: Following commands;Awareness;Problem solving;Safety/judgement;Attention   Current Attention Level: Focused   Following Commands: Follows one step commands inconsistently Safety/Judgement: Decreased awareness of safety;Decreased awareness of deficits   Problem Solving: Difficulty sequencing;Requires verbal cues;Requires tactile cues General Comments:  pt oriented to self but unable to state  his location; agrees with PT when told he was in the hospital; cooperative during eval (RN reports agitatiion earlier today)    General Comments General comments (skin integrity, edema, etc.): pt with upward fixed gaze in supine, unable to track visually    Exercises        Assessment/Plan    PT Assessment Patient needs continued PT services  PT Diagnosis Altered mental status   PT Problem List Decreased  activity tolerance;Decreased balance;Decreased mobility;Decreased cognition  PT Treatment Interventions Functional mobility training;Therapeutic activities;Gait training;DME instruction;Therapeutic exercise;Patient/family education;Cognitive remediation   PT Goals (Current goals can be found in the Care Plan section) Acute Rehab PT Goals Patient Stated Goal: pt unable to state PT Goal Formulation: Patient unable to participate in goal setting Time For Goal Achievement: 06/14/14 Potential to Achieve Goals: Good    Frequency Min 3X/week   Barriers to discharge        Co-evaluation               End of Session   Activity Tolerance: Patient tolerated treatment well Patient left: in bed;with call bell/phone within reach;with bed alarm set Nurse Communication: Mobility status         Time: 1610-9604 PT Time Calculation (min) (ACUTE ONLY): 13 min   Charges:   PT Evaluation $Initial PT Evaluation Tier I: 1 Procedure     PT G CodesKenyon Moreno 06-04-14, 4:28 PM

## 2014-05-31 NOTE — Progress Notes (Signed)
OT Cancellation Note  Patient Details Name: Dylan Moreno MRN: [id number] DOB: 04/24/1940   Cancelled Treatment:    Reason Eval/Treat Not Completed: Other (comment) Pt too agitated/ confused to participate with OT.  Will check on pt next day for appropriateness. Kari Baars, Blair  Payton Mccallum D 05/31/2014, 11:01 AM

## 2014-06-01 ENCOUNTER — Encounter: Payer: Self-pay | Admitting: *Deleted

## 2014-06-01 ENCOUNTER — Ambulatory Visit (HOSPITAL_COMMUNITY): Admission: RE | Admit: 2014-06-01 | Payer: Medicare Other | Source: Ambulatory Visit

## 2014-06-01 ENCOUNTER — Ambulatory Visit: Payer: Self-pay | Admitting: Hematology and Oncology

## 2014-06-01 DIAGNOSIS — E860 Dehydration: Secondary | ICD-10-CM

## 2014-06-01 DIAGNOSIS — F1020 Alcohol dependence, uncomplicated: Secondary | ICD-10-CM

## 2014-06-01 DIAGNOSIS — C01 Malignant neoplasm of base of tongue: Secondary | ICD-10-CM

## 2014-06-01 DIAGNOSIS — R591 Generalized enlarged lymph nodes: Secondary | ICD-10-CM

## 2014-06-01 DIAGNOSIS — E46 Unspecified protein-calorie malnutrition: Secondary | ICD-10-CM

## 2014-06-01 DIAGNOSIS — G919 Hydrocephalus, unspecified: Secondary | ICD-10-CM

## 2014-06-01 DIAGNOSIS — E039 Hypothyroidism, unspecified: Secondary | ICD-10-CM

## 2014-06-01 DIAGNOSIS — R4182 Altered mental status, unspecified: Secondary | ICD-10-CM

## 2014-06-01 DIAGNOSIS — Z8673 Personal history of transient ischemic attack (TIA), and cerebral infarction without residual deficits: Secondary | ICD-10-CM

## 2014-06-01 DIAGNOSIS — E871 Hypo-osmolality and hyponatremia: Secondary | ICD-10-CM

## 2014-06-01 LAB — BASIC METABOLIC PANEL
Anion gap: 10 (ref 5–15)
BUN: 30 mg/dL — AB (ref 6–20)
CHLORIDE: 94 mmol/L — AB (ref 101–111)
CO2: 27 mmol/L (ref 22–32)
CREATININE: 0.69 mg/dL (ref 0.61–1.24)
Calcium: 9.4 mg/dL (ref 8.9–10.3)
GFR calc Af Amer: >60 mL/min (ref >60)
GFR calc non Af Amer: >60 mL/min (ref >60)
Glucose, Bld: 130 mg/dL — ABNORMAL HIGH (ref 65–99)
Potassium: 4.4 mmol/L (ref 3.5–5.1)
Sodium: 131 mmol/L — ABNORMAL LOW (ref 135–145)

## 2014-06-01 LAB — GLUCOSE, CAPILLARY
GLUCOSE-CAPILLARY: 113 mg/dL — AB (ref 65–99)
GLUCOSE-CAPILLARY: 115 mg/dL — AB (ref 65–99)
GLUCOSE-CAPILLARY: 119 mg/dL — AB (ref 65–99)
GLUCOSE-CAPILLARY: 129 mg/dL — AB (ref 65–99)
Glucose-Capillary: 148 mg/dL — ABNORMAL HIGH (ref 65–99)
Glucose-Capillary: 121 mg/dL — ABNORMAL HIGH (ref 65–99)

## 2014-06-01 LAB — ACTH STIMULATION, 3 TIME POINTS
CORTISOL 60 MIN: 40 ug/dL
Cortisol, 30 Min: 35 ug/dL
Cortisol, Base: 22.2 ug/dL

## 2014-06-01 LAB — VITAMIN B1: Vitamin B1 (Thiamine): 157.3 nmol/L (ref 66.5–200.0)

## 2014-06-01 MED ORDER — LORAZEPAM 2 MG/ML IJ SOLN
INTRAMUSCULAR | Status: AC
  Administered 2014-06-01: 1 mg via INTRAVENOUS
  Filled 2014-06-01: qty 1

## 2014-06-01 MED ORDER — LORAZEPAM 2 MG/ML IJ SOLN
1.0000 mg | INTRAMUSCULAR | Status: AC
Start: 2014-06-01 — End: 2014-06-01
  Administered 2014-06-01: 1 mg via INTRAVENOUS

## 2014-06-01 NOTE — Progress Notes (Signed)
Patient became agitated, tried to get OOB and also tried to hit the staff members. MD notified and ordered Ativan 1 mg IV, med administered. Will continue to monitor the patient.

## 2014-06-01 NOTE — Progress Notes (Signed)
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 He also had and insomnia. Prior to admission, he was seen at the [facility]; suspecting Wernicke's encephalopathy, he received thiamine without improvement. On presentation, other symptoms included loose stools, believed to be due to PEG tube feedings, which resolved soon after admission. No fever or chills were present. No respiratory or cardiac complaints noted. No lower extremity swelling was reported.No bleeding issues such as epistaxis, hematemesis, hematuria or hematochezia were noted. His pain control was adequate with morphine.  CT of the head with contrast showed worsening known hydrocephalus. CT of the neck with contrast showed improvement of his lymphadenopathy. Hospitalist team is involved in his care. Today, he continues to be confused, but able to follow very simple commands. Suspected this is related to polypharmacy, including fentanyl patch, which was weaned off with improvement of symptoms.He appears less confused.  Labs are remarkable for hyponatremia, with Na today at 131 (was 125 on 5/28). Creatinine and Calcium are normal. TSH is normal. CBC,Thiamine, folic acid, P71 levels are essentially unremarkable except for mild anemia.     Malignant neoplasm of base of tongue   03/03/2014 Imaging Ct neck elsewhere showed base of tongue mass crossing midline, bilateral LN enlargement, great >4 cm   03/05/2014 Procedure He has FNA biopsy of LN   03/05/2014 Pathology Results NZA [id number] biopsy  confirmed squmaous cell carcinoma HPV positive   03/18/2014 Imaging PET/Ct scan showed tongue mass, bilateral LN   04/02/2014 Procedure He has placement of feeding tube and port   04/06/2014 -  Radiation Therapy He received radiation treatment   04/07/2014 -  Chemotherapy He received high dose cisplatin   04/28/2014 Adverse Reaction Cycle 2 chemotherapy is delayed due to neutropenia     MEDICATIONS: Scheduled Meds: . amphetamine-dextroamphetamine  20 mg Oral BID WC  . antiseptic oral rinse  7 mL Mouth Rinse q12n4p  . aspirin  81 mg Oral Daily  . atorvastatin  20 mg Oral q1800  . chlorhexidine  15 mL Mouth Rinse BID  . clopidogrel  75 mg Oral Q breakfast  . enoxaparin (LOVENOX) injection  40 mg Subcutaneous Q24H  . feeding supplement (GLUCERNA 1.2 CAL)  474 mL Per Tube 3 times per day  . feeding supplement (PRO-STAT SUGAR FREE 64)  30 mL Per Tube TID  . folic acid  1 mg Oral Daily  . levothyroxine  75 mcg Oral QAC breakfast  . multivitamin with minerals  1 tablet Oral Daily  . scopolamine  1 patch Transdermal Q72H  . thiamine  100 mg Oral Daily   Or  . thiamine  100 mg Intravenous Daily   Continuous Infusions:  PRN Meds:.morphine injection, ondansetron **OR** ondansetron (ZOFRAN) IV ALLERGIES:  No Known Allergies   PHYSICAL EXAMINATION:  Filed Vitals:   06/01/14 0448  BP: 132/74  Pulse: 89  Temp: 97.8 F (36.6 C)  Resp: 22   Filed Weights   05/30/14 0541 05/31/14 0526 06/01/14 0448  Weight: 188 lb (85.276 kg) 188 lb (85.276 kg) 179 lb 3.2 oz (  81.285 kg)    ECOG PERFORMANCE STATUS: 2-3  GENERAL: alert, no distress and somewhat restless, not conversant, but able to follow very simple commands. SKIN: Noted radiation-induced skin dermatitis around his neck. Areas of ecchymoses noted in arms, skin dry.  EYES: normal, Conjunctiva are pink and non-injected, sclera clear OROPHARYNX: Dry mucous membrane. Tongue with superficial white coating. NECK: supple, thyroid normal size,  non-tender, without nodularity LYMPH: Previous he palpable lymphadenopathy has improved. LUNGS: decreased breath sounds at the bases with normal breathing effort HEART: regular rate & rhythm and no murmurs and no lower extremity edema ABDOMEN:abdomen soft, non-tender and normal bowel sounds. Feeding tube site looks okay Musculoskeletal:no cyanosis of digits and no clubbing  NEURO: awake, confused, agitated  but able to follow simple commands. Mittens present  LABORATORY/RADIOLOGY DATA:   Recent Labs Lab 05/25/14 2042 05/26/14 0459 05/27/14 0421 05/29/14 0623 05/31/14 0600  WBC 2.7* 2.9* 5.4 8.8 7.1  HGB 9.8* 10.0* 10.1* 9.7* 10.2*  HCT 28.9* 29.2* 30.5* 27.9* 29.0*  PLT 401* 400 426* 360 397  MCV 87.6 86.9 85.4 83.3 84.1  MCH 29.7 29.8 28.3 29.0 29.6  MCHC 33.9 34.2 33.1 34.8 35.2  RDW 13.1 13.1 12.8 12.6 12.9    CMP    Recent Labs Lab 05/26/14 0459 05/27/14 0421 05/29/14 0623  05/30/14 0500 05/30/14 1840 05/30/14 2300 05/31/14 0245 05/31/14 0600 06/01/14 0438  NA 134* 131* 125*  < > 127* 129* 128* 129* 129* 131*  K 4.5 4.6 4.0  --  4.0  --   --   --  4.3 4.4  CL 99* 97* 92*  --  92*  --   --   --  94* 94*  CO2 25 25 24   --  26  --   --   --  25 27  GLUCOSE 101* 117* 116*  --  122*  --   --   --  121* 130*  BUN 26* 23* 15  --  19  --   --   --  23* 30*  CREATININE 1.02 0.98 0.64  --  0.71  --   --   --  0.61 0.69  CALCIUM 9.3 9.1 8.5*  --  8.8*  --   --   --  9.2 9.4  AST 26  --   --   --  40  --   --   --  49*  --   ALT 30  --   --   --  47  --   --   --  66*  --   ALKPHOS 86  --   --   --  96  --   --   --  106  --   BILITOT 0.5  --   --   --  0.6  --   --   --  0.4  --   < > = values in this interval not displayed.      Component Value Date/Time   BILITOT 0.4 05/31/2014 0600   BILITOT 0.33 05/24/2014 1541    Liver Function Tests:  Recent Labs Lab 05/26/14 0459 05/30/14 0500 05/31/14 0600  AST 26 40 49*  ALT 30 47 66*  ALKPHOS 86 96 106    BILITOT 0.5 0.6 0.4  PROT 6.9 6.6 6.7  ALBUMIN 3.2* 2.8* 2.8*   No results for input(s): LIPASE, AMYLASE in the last 168 hours.  Recent Labs Lab 05/27/14 1953  AMMONIA 20    CBG:  Recent Labs Lab 05/31/14 1233 05/31/14 1714 05/31/14 2130 06/01/14 0042 06/01/14 0437  GLUCAP 127* 126* 118* 119* 115*    Radiology Studies:  Ct Head Wo Contrast  05/25/2014   CLINICAL DATA:  Increasing confusion today, history of squamous cell carcinoma of the tongue  EXAM: CT HEAD WITHOUT CONTRAST  TECHNIQUE: Contiguous axial images were obtained from the base of the skull through the vertex without intravenous contrast.  COMPARISON:  12/29/2012  FINDINGS: Bony calvarium is intact. No gross soft tissue abnormality is noted. Prominence of the lateral ventricles is again identified and stable. No findings to suggest acute hemorrhage, acute infarction or space-occupying mass lesion are noted.  IMPRESSION: Stable ventricular prominence.  No acute abnormality is noted.   Electronically Signed   By: Inez Catalina M.D.   On: 05/25/2014 16:35   Ct Head W Wo Contrast  05/27/2014   CLINICAL DATA:  Tongue cancer.  Tongue swelling.  EXAM: CT HEAD WITHOUT AND WITH CONTRAST  TECHNIQUE: Contiguous axial images were obtained from the base of the skull through the vertex without and with intravenous contrast  CONTRAST:  112mL OMNIPAQUE IOHEXOL 300 MG/ML  SOLN  COMPARISON:  CT head 05/25/2014  FINDINGS: Moderate ventricular enlargement appears slightly larger compared with the prior study. Prominent aqueduct. 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  Negative for acute infarct.  Negative for hemorrhage or mass.  No enhancing lesions identified following contrast infusion  Negative for skull lesion  Image quality degraded by motion.  IMPRESSION: Ventricular enlargement slightly more prominent. Findings suggest communicating hydrocephalus  Negative for metastatic disease or acute infarct.   Electronically Signed   By: Franchot Gallo M.D.   On: 05/27/2014 16:18   Ct Soft Tissue Neck W Contrast  05/27/2014   CLINICAL DATA:  Squamous cell carcinoma tongue.  Tongue swelling.  EXAM: CT NECK WITH CONTRAST  TECHNIQUE: Multidetector CT imaging of the neck was performed using the standard protocol following the bolus administration of intravenous contrast.  CONTRAST:  147mL OMNIPAQUE IOHEXOL 300 MG/ML  SOLN  COMPARISON:  PET-CT 03/18/2014  FINDINGS: Pharynx and larynx: Prior PET scan revealed a mass in the base of tongue in the midline which was ulcerated. This appears improved without mass lesion in the area post treatment. Epiglottis and larynx are negative.  Salivary glands: Negative  Thyroid: Negative  Lymph nodes: Necrotic lymph node in the left mid mid neck is slightly smaller now measuring 3.9 x 2.5 mm. Posterior level 4 lymph node also smaller now measuring 16 mm. Right level 2 lymph node measures 11 mm slightly smaller. No new adenopathy.  Mild skin edema related to radiation treatment.  Vascular: Carotid artery patent bilaterally. Jugular vein patent bilaterally. Right-sided Port-A-Cath.  Limited intracranial: Negative  Visualized orbits: Not visualized  Mastoids and visualized paranasal sinuses: Mild mucosal edema right maxillary sinus. Mastoid sinus clear bilaterally.  Skeleton: Cervical spondylosis.  No bony lesion.  Upper chest: Negative for mass or lung nodule.  IMPRESSION: Cervical adenopathy has improved since 03/18/2014 related to cancer treatment. Large necrotic node on the left is smaller. Additional posterior lymph node on the left is smaller as well. Small right level 2 lymph node slightly smaller. No new adenopathy  Tongue base mass difficult to see on the current study.   Electronically Signed   By: Franchot Gallo M.D.   On: 05/27/2014 15:58   Dg Chest Port 1 View  05/28/2014   CLINICAL DATA:  Cough and shortness of breath.  Tongue cancer.  EXAM: PORTABLE CHEST -  1 VIEW  COMPARISON:  03/02/2014  FINDINGS: Right IJ  Port-A-Cath has tip at the cavoatrial junction. Lungs are hypoinflated with mild linear density over the lateral left base and right midlung likely atelectasis. No pneumothorax. Minimal prominence of the perihilar vessels suggesting minimal vascular congestion. Cardiomediastinal silhouette is within normal. Remainder of the exam is unchanged.  IMPRESSION: Suggestion of minimal vascular congestion with mild linear atelectasis over the right midlung and left base.   Electronically Signed   By: Marin Olp M.D.   On: 05/28/2014 12:57    ASSESSMENT AND PLAN:  Acute mental status changes  He was admitted on 5/24 with altered mental status, initially believed to be secondary to alcohol withdrawal CT of the head with contrast showed worsening known hydrocephalus.  CT of the neck with contrast showed improvement of his lymphadenopathy. Labs were remarkable for hyponatremia Thiamine, folic acid, L97 levels were unremarkable Suspected this is related to polypharmacy, including fentanyl patch, which was weaned off with improvement of symptoms. Hospitalist team is involved in his care.  Consider neurology consult to clarify the cause of hydrocephalus.  Malignant neoplasm of base of tongue S/p Cycle 3 chemo with Cisplatin, last on 5/4, and radiation treatments. CT of the neck with contrast on 5/26 shows response to treatment, with improvement of his cervical lymphadenopathy Continue supportive care.  Protein calorie malnutrition He has significant protein calorie malnutrition with low albumin. Appreciate nutritionist involvement Continue tube feeding.  Leukopenia due to antineoplastic chemotherapy, resolved This is related to side effects of treatment. He is not symptomatic with infection. Continue close observation.  Anemia in neoplastic disease This is likely anemia of chronic disease.  No bleeding issues noted We will observe for now. He does not require transfusion now.   Dehydration He  has received aggressive IV fluid hydration on admission, then discontinued due to hyponatremia, now managed by primary team.  Chronic alcoholism On withdrawal protocol  DVT prophylaxis On Lovenox  Hyponatremia Improving with fluid restriction, as per primary team.  Disposition:  Likely to SNF when stable  Full Code  Other medical issues including hypothyroidism, history of CVA, as per admitting team.   We'll follow.   **Disclaimer: This note was dictated with voice recognition software. Similar sounding words can inadvertently be transcribed and this note may contain transcription errors which may not have been corrected upon publication of note.Sharene Butters E, PA-C 06/01/2014, 7:05 AM Francess Mullen, MD 06/01/2014

## 2014-06-01 NOTE — Plan of Care (Signed)
Problem: Phase I Progression Outcomes Goal: OOB as tolerated unless otherwise ordered Outcome: Progressing Pt up with PT yesterday

## 2014-06-01 NOTE — Progress Notes (Addendum)
[facility] PROGRESS NOTE  Dylan Moreno YJE:[id number] DOB: 01-22-1940 DOA: 05/25/2014 PCP: Kandice Hams, MD  Off Service Summary: 74yo with a hx of tongue cancer who completed radiation tx who presents with acute mental status and lethargy. There are no signs of active infection. Patient had been continued with chronic narcotics in the form of a fentanyl patch as well as PRN morphine. These have been weaned to off and the patient's mentation has since improved, although he still remains confused at times.   Oncology is following  Assessment/Plan: 1. Acute delirium 1. Patient was initially thought to have possible ETOH withdrawal, however pt has been without ETOH for nearly 10 weeks making ETOH withdrawal less likely. Pt had been continued on CIWA. 2. Head CT w/o contrast on admit was unremarkable 3. Follow up head CT with contrast showed worsening hydrocephalus. Pt's wife later reported shuffling/unsteady gait prior to admit. Have discussed case with Neurology who recommends outpatient Neurologic work up for NPH when discharged 4. Thiamine, folic acid, O37 levels were unremarkable 5. Suspecting polypharmacy as pt's mentation has showed marked improvement after fentanyl patch was weaned to off 2. Chronic alcoholism 1. On CIWA per above, although doubt etoh withdrawal 3. ADHD 1. Adderall was resumed this admission 4. Severe protein calorie malnutrition 1. Nutrition was consulted 2. Cont with nutritional supplement as tolerated 3. SLP consulted and will eval when pt is more awake 5. Hypothyroid 1. Cont with thyroid replacement as tolerated 2. TSH is w/in normal limits 6. Hx CVA 1. Presenting head non-contrast CT is unremarkable 2. Follow up CT head w/wo contrast unremarkable for acute process or new infarct 7. Malignant tongue cancer 1. Followed by Oncology and Rad Onc 2. CT neck w/ contrast had demonstrated some improvement in pt's malignancy following recent radiation  tx 8. DVT prophylaxis 1. Lovenox subQ 9. Hyponatremia 1. Sodium had bee in the 120's 2. Pt with low serum and urine osm with elevated urine Na 3. IVF on hold 4. Minimizing amount of free water flush per PEG 5. Normal cosyntropin stim test 6. Sodium improved to 131 today. Continued to follow  Code Status: Full Family Communication: Pt in room, updated pt's wife at bedside Disposition Plan: Pending  Consultants:  Oncology  Neurology (over phone)  Procedures:    Antibiotics:     HPI/Subjective: Patient is without complaints today  Objective: Filed Vitals:   05/31/14 1349 05/31/14 2132 06/01/14 0448 06/01/14 1415  BP: 127/78 141/81 132/74 140/56  Pulse: 99 104 89 103  Temp: 97.6 F (36.4 C) 97.8 F (36.6 C) 97.8 F (36.6 C) 97.3 F (36.3 C)  TempSrc: Axillary Axillary Axillary Axillary  Resp: 20 22 22 18   Height:      Weight:   81.285 kg (179 lb 3.2 oz)   SpO2: 97% 98% 97% 96%    Intake/Output Summary (Last 24 hours) at 06/01/14 1511 Last data filed at 06/01/14 1441  Gross per 24 hour  Intake    190 ml  Output   1575 ml  Net  -1385 ml   Filed Weights   05/30/14 0541 05/31/14 0526 06/01/14 0448  Weight: 85.276 kg (188 lb) 85.276 kg (188 lb) 81.285 kg (179 lb 3.2 oz)    Exam:   General:  Awake, conversant, joking, in nad  Cardiovascular: regular, s1, s2  Respiratory: normal resp effort, no wheezing  Abdomen: soft, pos BS  Musculoskeletal: perfused, no clubbing, no cyanosis, mitt restrants in place Data Reviewed: Basic Metabolic Panel:  Recent Labs  Lab 05/27/14 0421 05/29/14 0623  05/30/14 0500 05/30/14 1840 05/30/14 2300 05/31/14 0245 05/31/14 0600 06/01/14 0438  NA 131* 125*  < > 127* 129* 128* 129* 129* 131*  K 4.6 4.0  --  4.0  --   --   --  4.3 4.4  CL 97* 92*  --  92*  --   --   --  94* 94*  CO2 25 24  --  26  --   --   --  25 27  GLUCOSE 117* 116*  --  122*  --   --   --  121* 130*  BUN 23* 15  --  19  --   --   --  23* 30*   CREATININE 0.98 0.64  --  0.71  --   --   --  0.61 0.69  CALCIUM 9.1 8.5*  --  8.8*  --   --   --  9.2 9.4  < > = values in this interval not displayed. Liver Function Tests:  Recent Labs Lab 05/26/14 0459 05/30/14 0500 05/31/14 0600  AST 26 40 49*  ALT 30 47 66*  ALKPHOS 86 96 106  BILITOT 0.5 0.6 0.4  PROT 6.9 6.6 6.7  ALBUMIN 3.2* 2.8* 2.8*   No results for input(s): LIPASE, AMYLASE in the last 168 hours.  Recent Labs Lab 05/27/14 1953  AMMONIA 20   CBC:  Recent Labs Lab 05/25/14 2042 05/26/14 0459 05/27/14 0421 05/29/14 0623 05/31/14 0600  WBC 2.7* 2.9* 5.4 8.8 7.1  HGB 9.8* 10.0* 10.1* 9.7* 10.2*  HCT 28.9* 29.2* 30.5* 27.9* 29.0*  MCV 87.6 86.9 85.4 83.3 84.1  PLT 401* 400 426* 360 397   Cardiac Enzymes: No results for input(s): CKTOTAL, CKMB, CKMBINDEX, TROPONINI in the last 168 hours. BNP (last 3 results) No results for input(s): BNP in the last 8760 hours.  ProBNP (last 3 results) No results for input(s): PROBNP in the last 8760 hours.  CBG:  Recent Labs Lab 05/31/14 2130 06/01/14 0042 06/01/14 0437 06/01/14 0725 06/01/14 1132  GLUCAP 118* 119* 115* 129* 148*    Recent Results (from the past 240 hour(s))  TECHNOLOGIST REVIEW     Status: None   Collection Time: 05/24/14  3:41 PM  Result Value Ref Range Status   Technologist Review 2% Myelocytes  Final     Studies: No results found.  Scheduled Meds: . amphetamine-dextroamphetamine  20 mg Oral BID WC  . antiseptic oral rinse  7 mL Mouth Rinse q12n4p  . aspirin  81 mg Oral Daily  . atorvastatin  20 mg Oral q1800  . chlorhexidine  15 mL Mouth Rinse BID  . clopidogrel  75 mg Oral Q breakfast  . enoxaparin (LOVENOX) injection  40 mg Subcutaneous Q24H  . feeding supplement (GLUCERNA 1.2 CAL)  474 mL Per Tube 3 times per day  . feeding supplement (PRO-STAT SUGAR FREE 64)  30 mL Per Tube TID  . folic acid  1 mg Oral Daily  . levothyroxine  75 mcg Oral QAC breakfast  . multivitamin with  minerals  1 tablet Oral Daily  . scopolamine  1 patch Transdermal Q72H  . thiamine  100 mg Oral Daily   Or  . thiamine  100 mg Intravenous Daily      Active Problems:   ADHD (attention deficit hyperactivity disorder)   Malignant neoplasm of base of tongue   Chronic alcoholism   Altered mental status   Protein-calorie malnutrition, severe  Tongue swelling   Hyponatremia   CHIU, STEPHEN K  [facility] Pager [phone]. If 7PM-7AM, please contact night-coverage at [email or web address], password [facility] 06/01/2014, 3:11 PM  LOS: 7 days

## 2014-06-01 NOTE — Progress Notes (Signed)
Oncology Nurse Navigator Documentation  Oncology Nurse Navigator Flowsheets 05/25/2014 05/25/2014 05/25/2014 05/26/2014 05/26/2014 05/28/2014 06/01/2014  Navigator Encounter Type Telephone Telephone Treatment Other Telephone Other Other  Patient Visit Type - - Radonc Inpatient - Inpatient Inpatient  Visited patient [id number] to check on well-being.  His wife was present.  Patient alert, responsive.  He was confused, speech rambling, unable to have sensical conversation.  In this respect, his presentation was more like his baseline prior to his admission to [facility] last week Tuesday.  Fall precautions in place, he is wearing mittens to prevent dislodgement of IV, PEG, Foley catheter.  Wife understands she can contact me with needs/concerns.   Treatment Phase - Other - - - - -  Barriers/Navigation Needs - - - - - - -  Education - - - - - - -  Interventions - Coordination of Care Coordination of Care - - - -  Coordination of Care - Other - - - - -  Time Spent with Patient 15 30 90 45 15 30 45    Gayleen Orem, Therapist, sports, Copywriter, advertising, [facility] [place] [phone]

## 2014-06-01 NOTE — Progress Notes (Signed)
OT Cancellation Note  Patient Details Name: Dylan Moreno MRN: [id number] DOB: [birth year]   Cancelled Treatment:    Reason Eval/Treat Not Completed: Other (comment) Per nursing and nursing tech, pt still confused and not ready to participate in functional tasks. Will try back next date for OT if appropriate.  [place], [place] 06/01/2014, 11:18 AM

## 2014-06-01 NOTE — Progress Notes (Signed)
Nutrition Follow-up  DOCUMENTATION CODES:  Severe malnutrition in context of chronic illness  INTERVENTION:  -Continue 2 cans of Glucerna 1.2 TID (10am, 2pm, 6pm).  - Flush with 120 ml free water before and after each bolus. -Provide Prostat liquid protein PO 30 ml TID, each supplement provides 100 kcal, 15 grams protein. -Recommend additional 250 ml of free water -Tube feeding regimen provides 2010 (87% estimated needs), 130 g of protein and 2122 ml of free water. -RD to continue to monitor  NUTRITION DIAGNOSIS:  Malnutrition related to chronic illness as evidenced by severe depletion of muscle mass, percent weight loss.  Ongoing.  GOAL:  Patient will meet greater than or equal to 90% of their needs  Unmet  MONITOR:  Weight trends, Labs, TF tolerance, Skin, I & O's   ASSESSMENT: 74 year old male recently diagnosed with malignant neo plasm of the base of tongue and underwent chemotherapy and radiation treatment. He also had PEG tube placed earlier this month, when all of his by mouth medications were discontinued. Patient has been getting tube feedings with Osmolite 1 can 6 times a day through the PEG tube. PMHx of alcohol abuse, quit drinking 10 weeks ago.  5/29: Pt with hyponatremia, per pharmacy note, MD requested formula change for TF. Pharmacy changed formula from Osmolite 1.5 to Glucerna 1.2, 6 cans daily which does not meet his estimated nutritional needs. RD to order Prostat TID to meet 100% of pt's protein needs.   5/31: -Pt tolerating TF regimen, 0 ml residuals -Weight has decreased to 179 lb (-9 lb since 5/29) -Pt continues to be confused -Per MD note, flushes have been decreased to help with hyponatremia (which is improving).  Labs reviewed: Low Na (131) Elevated BUN  CBGs: 115-148  Height:  Ht Readings from Last 1 Encounters:  05/26/14 6\' 1"  (1.854 m)    Weight:  Wt Readings from Last 1 Encounters:  06/01/14 179 lb 3.2 oz (81.285 kg)     Ideal Body Weight:  83.6 kg  Wt Readings from Last 10 Encounters:  06/01/14 179 lb 3.2 oz (81.285 kg)  05/24/14 171 lb 6.4 oz (77.747 kg)  05/10/14 184 lb (83.462 kg)  05/03/14 188 lb 6.4 oz (85.458 kg)  04/21/14 186 lb (84.369 kg)  04/21/14 185 lb 1.6 oz (83.961 kg)  04/05/14 190 lb (86.183 kg)  03/17/14 185 lb (83.915 kg)  08/04/13 186 lb 4 oz (84.482 kg)  05/04/13 191 lb 3 oz (86.722 kg)    BMI:  Body mass index is 23.65 kg/(m^2).  Estimated Nutritional Needs:  Kcal:  3299-2426  Protein:  115-125g  Fluid:  2.5L/day     Skin:  Reviewed, no issues  Diet Order:  Diet NPO time specified  EDUCATION NEEDS:  No education needs identified at this time   Intake/Output Summary (Last 24 hours) at 06/01/14 1152 Last data filed at 06/01/14 0900  Gross per 24 hour  Intake    150 ml  Output   1725 ml  Net  -1575 ml    Last BM:  5/31  Clayton Bibles, MS, RD, LDN Pager: [phone] After Hours Pager: [phone]

## 2014-06-01 NOTE — Evaluation (Signed)
SLP Cancellation Note  Patient Details Name: NOLTON DENIS MRN: [id number] DOB: 08-28-1940   Cancelled treatment:       Reason Eval/Treat Not Completed: Other (comment);Patient's level of consciousness (per RN, pt recently was very agitated requiring intervention from 4 nurses for safety, pt also recently given Ativan, will follow up next date for evaluation as pt able to participate)  Luanna Salk, [facility] SLP [phone]

## 2014-06-02 ENCOUNTER — Inpatient Hospital Stay (HOSPITAL_COMMUNITY): Payer: Medicare Other

## 2014-06-02 ENCOUNTER — Ambulatory Visit: Payer: Self-pay

## 2014-06-02 DIAGNOSIS — R410 Disorientation, unspecified: Secondary | ICD-10-CM

## 2014-06-02 DIAGNOSIS — E43 Unspecified severe protein-calorie malnutrition: Secondary | ICD-10-CM

## 2014-06-02 HISTORY — DX: Disorientation, unspecified: R41.0

## 2014-06-02 LAB — GLUCOSE, CAPILLARY
GLUCOSE-CAPILLARY: 113 mg/dL — AB (ref 65–99)
GLUCOSE-CAPILLARY: 108 mg/dL — AB (ref 65–99)
GLUCOSE-CAPILLARY: 144 mg/dL — AB (ref 65–99)
Glucose-Capillary: 111 mg/dL — ABNORMAL HIGH (ref 65–99)
Glucose-Capillary: 148 mg/dL — ABNORMAL HIGH (ref 65–99)
Glucose-Capillary: 122 mg/dL — ABNORMAL HIGH (ref 65–99)

## 2014-06-02 LAB — BASIC METABOLIC PANEL
ANION GAP: 11 (ref 5–15)
BUN: 35 mg/dL — ABNORMAL HIGH (ref 6–20)
CO2: 26 mmol/L (ref 22–32)
Calcium: 9.5 mg/dL (ref 8.9–10.3)
Chloride: 96 mmol/L — ABNORMAL LOW (ref 101–111)
Creatinine, Ser: 0.81 mg/dL (ref 0.61–1.24)
GFR calc non Af Amer: >60 mL/min (ref >60)
Glucose, Bld: 134 mg/dL — ABNORMAL HIGH (ref 65–99)
POTASSIUM: 4.9 mmol/L (ref 3.5–5.1)
Sodium: 133 mmol/L — ABNORMAL LOW (ref 135–145)

## 2014-06-02 LAB — VITAMIN B1: VITAMIN B1 (THIAMINE): 34 nmol/L — AB (ref 8–30)

## 2014-06-02 NOTE — Progress Notes (Signed)
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 Was able to get him OOB to amb however pt was unable to follow even simple commands and pt became increasingly agitated during session.  Used a B platform walker for increased safety and allow pt to grip AD versus therapist.  A third assist was needed to follow with recliner for sudden sit safety.  Very unsteady gait with posterior lean and resistance.  HIGH FALL RISK. Assisted back to low bed with alarm and B mittens.   Follow Up Recommendations  SNF     Equipment Recommendations       Recommendations for Other Services       Precautions / Restrictions Precautions Precautions: Fall Precaution Comments: PEG tube/ ABD binder Restrictions Weight Bearing Restrictions: No    Mobility  Bed Mobility Overal bed mobility: Needs Assistance;+2 for physical assistance;+ 2 for safety/equipment Bed Mobility: Supine to Sit;Sit to Supine     Supine to sit: Max assist;+2 for physical assistance;+2 for safety/equipment Sit to supine: Max assist;+2 for physical assistance;+2 for safety/equipment   General bed mobility comments: hand over hand assist.  Pt resistant.  Unable to follow commands.  Easily distracted and easily agitated.   Transfers Overall transfer level: Needs assistance               General transfer comment: + 3 assist to stand hand over hand to decrease self injury and pt aggressiveness.    Ambulation/Gait Ambulation/Gait assistance: +2 physical assistance;+2 safety/equipment;Max  assist Ambulation Distance (Feet): 12 Feet Assistive device: Bilateral platform walker       General Gait Details: Used B platform walker to encourage pt's grip on AD vs therapist.  Very resistant.  Max encouragement and a third assist to follow with recliner for sudden sit safety. Pt's impaired cognition limits activity. Increased agitation with increased distance.    Stairs            Wheelchair Mobility    Modified Rankin (Stroke Patients Only)       Balance                                    Cognition Arousal/Alertness:  (awake) Behavior During Therapy: Restless;Agitated Overall Cognitive Status: Impaired/Different from baseline Area of Impairment: Following commands;Awareness;Problem solving;Safety/judgement;Attention;Memory;Orientation Orientation Level: Disoriented to Current Attention Level: Divided   Following Commands: Follows one step commands inconsistently Safety/Judgement: Decreased awareness of safety;Decreased awareness of deficits     General Comments: pt unable to follow commands.  awake and conversive random. Increased agitation during session.    Exercises      General Comments        Pertinent Vitals/Pain Pain Assessment: No/denies pain    Home Living                      Prior Function            PT Goals (current goals can now be found in the care plan section) Progress towards PT goals: Progressing toward goals  Frequency  Min 3X/week    PT Plan      Co-evaluation             End of Session Equipment Utilized During Treatment: Gait belt Activity Tolerance: Treatment limited secondary to agitation Patient left: in bed;with call bell/phone within reach;with bed alarm set;with nursing/sitter in room (low bed)     Time: 3202-3343 PT Time Calculation (min) (ACUTE ONLY): 28 min  Charges:  $Gait Training: 8-22 mins $Therapeutic Activity: 8-22 mins                    G Codes:      Rica Koyanagi  PTA [facility]  Acute  Rehab Pager      [phone]

## 2014-06-02 NOTE — Evaluation (Signed)
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  Pt was calm during OT evaluation, but has periods of agitation.  Unsure of PLOF, but A x 2 is needed for safety.  Pt currently sustains attention for up to one minute with cues, and is impulsive.  Mitts are used to keep lines safe.  He needs min to total A x 2 for ADLs, except for grooming. Will follow in acute setting with min A level goals for UB adls, min guard for sitting tolerance and min guard X 2 for SPT to toilet.      Follow Up Recommendations  SNF    Equipment Recommendations  3 in 1 bedside comode    Recommendations for Other Services       Precautions / Restrictions Precautions Precautions: Fall Precaution Comments: PEG tube/ ABD binder Restrictions Weight Bearing Restrictions: No      Mobility Bed Mobility Overal bed mobility: Needs Assistance;+2 for physical assistance;+ 2 for safety/equipment Bed Mobility: Supine to Sit;Sit to Supine     Supine to sit: Mod assist Sit to supine: Min assist   General bed mobility comments: extra time, multimodal cues  Transfers Overall transfer level: Needs assistance Equipment used: 2 person hand held assist Transfers: Sit to/from Stand;Stand Pivot Transfers Sit to Stand: Min assist;+2 safety/equipment Stand pivot transfers: Min assist;+2 safety/equipment       General transfer comment: pt pulled up on therapist's hand; assist to rise and steady    Balance   Sitting-balance support: Feet supported Sitting balance-Leahy Scale: Poor Sitting balance -  Comments: Pt sat with min gaurd at times, but then leaned to L and propped.  Needs at least min guard for safety                                    ADL Overall ADL's : Needs assistance/impaired     Grooming: Wash/dry face;Supervision/safety   Upper Body Bathing: Moderate assistance;Sitting   Lower Body Bathing: Maximal assistance;Sit to/from stand   Upper Body Dressing : Sitting;Moderate assistance   Lower Body Dressing: Maximal assistance;Sit to/from stand   Toilet Transfer: Minimal assistance;+2 for safety/equipment;Stand-pivot;BSC   Toileting- Clothing Manipulation and Hygiene: Total assistance;+2 for safety/equipment;Sit to/from stand         General ADL Comments: Pt needs physical assist for ADLs as he is very distractable, especially with lines/environmentall objects.  He needed min cues to wash face.  Pt redirected from pullling at catheter several times and did pull off (condom) during toilet hygiene.  Calm during session and mitts were removed.       Vision     Perception     Praxis      Pertinent Vitals/Pain Pain Assessment: No/denies pain     Hand Dominance     Extremity/Trunk Assessment Upper Extremity Assessment Upper Extremity Assessment: Overall WFL for tasks assessed           Communication Communication Communication: No difficulties   Cognition Arousal/Alertness: Awake/alert Behavior During Therapy: Impulsive Overall Cognitive Status: Impaired/Different from baseline Area of Impairment: Following commands;Awareness;Problem solving;Safety/judgement;Attention;Memory;Orientation  Orientation Level: Disoriented to;Place;Time;Situation (states wife told him he has cancer) Current Attention Level: Sustained   Following Commands: Follows one step commands inconsistently Safety/Judgement: Decreased awareness of safety;Decreased awareness of deficits     General Comments: Pt required multimodal cues at times.  Increased time to  follow.  Kept asking for wife and son   General Comments       Exercises       Shoulder Instructions      Home Living Family/patient expects to be discharged to:: Unsure                                 Additional Comments: pt disoriented to place; unable to answer PLOF questions      Prior Functioning/Environment          Comments: pt admitted with AMS--unsure if any assistance was needed    OT Diagnosis: Generalized weakness;Cognitive deficits   OT Problem List: Decreased strength;Decreased activity tolerance;Impaired balance (sitting and/or standing);Decreased cognition;Decreased safety awareness   OT Treatment/Interventions: Self-care/ADL training;DME and/or AE instruction;Balance training;Patient/family education;Cognitive remediation/compensation    OT Goals(Current goals can be found in the care plan section) Acute Rehab OT Goals Patient Stated Goal: none stated; agreeable to working with OT OT Goal Formulation: Patient unable to participate in goal setting Time For Goal Achievement: 06/16/14 Potential to Achieve Goals: Fair ADL Goals Pt Will Transfer to Toilet: with min guard assist;with +2 assist;bedside commode;stand pivot transfer Additional ADL Goal #1: pt will sustain attention to ADL task for 2 minutes with only initial cue and one cue to continue Additional ADL Goal #2: Pt will tolerate sitting EOB for adl task for 5 minutes with min guard assistance with table in front of him supporting one UE Additional ADL Goal #3: Pt will complete UB adls with min A, min cues in quiet environment  OT Frequency: Min 2X/week   Barriers to D/C:            Co-evaluation              End of Session Nurse Communication:  (NT and RN each assisted during toileting)  Activity Tolerance: Patient tolerated treatment well Patient left: in bed;with call bell/phone within reach;with bed alarm set   Time: 1331-1401 OT Time Calculation (min): 30  min Charges:  OT General Charges $OT Visit: 1 Procedure OT Evaluation $Initial OT Evaluation Tier I: 1 Procedure OT Treatments $Self Care/Home Management : 8-22 mins G-Codes:    Parvin Stetzer 2014-06-04, 3:19 PM  Lesle Chris, OTR/L [phone] 04-Jun-2014

## 2014-06-02 NOTE — Discharge Summary (Signed)
Physician Discharge Summary  Dylan Moreno QBH:[id number] DOB: 1940-07-05 DOA: 05/25/2014  PCP: Kandice Hams, MD  Admit date: 05/25/2014 Discharge date: 06/03/2014  Recommendations for Outpatient Follow-up:  1.  Repeat BMP on Friday to check sodium level and BUN:Cr 2.  To SNF for ongoing care:  PT/OT/SLP 3.  Please schedule follow up with Dr. Posey Pronto, Neurology, for possible NPH evaluation and ongoing monitoring of mentation 4.  Will need follow up with Drs. Alvy Bimler and [place] in about 2 weeks.     Discharge Diagnoses:  Principal Problem:   Subacute delirium Active Problems:   ADHD (attention deficit hyperactivity disorder)   Malignant neoplasm of base of tongue   Chronic alcoholism   Protein-calorie malnutrition, severe   Tongue swelling   Hyponatremia   Acute delirium   Discharge Condition: fair  Diet recommendation:  Nothing by mouth.  Oral care every 2 hours while awake.  Medications, water, and tube feeds through PEG only.  Ice chips PRN after oral care;Free water protocol after oral care;NPO   Liquid Administration via Cup, tsp  Medication Administration Via alternative means  Compensations Minimize environmental distractions  Postural Changes and/or Swallow Maneuvers Multiple swallows, cough and expectorate as needed         He is currently receiving Glucerna 1.2, 474 mL per tube 3 times a day with 160 mL of free water before and after each tube feed. He also receives pro-stat Sugar free 64, 30 mL's per tube 3 times a day.  Wt Readings from Last 3 Encounters:  06/02/14 83.915 kg (185 lb)  05/24/14 77.747 kg (171 lb 6.4 oz)  05/10/14 83.462 kg (184 lb)    History of present illness:  The patient is a 74-yaer old male with history of ADHD, TIA, Hypertension, Hyperlipidemia, GERD, cancer at the base of the tongue, alcohol abuse who presented with hallucinations, paranoia, muscle twitching, anxiety, and insomnia.  He was started on thiamine for possible  Wernicke's encephalopathy prior to admission.  At admission, he was alert and oriented x 2.  He was started on treatment for alcohol withdrawal, his narcotics were tapered, and he was hydrated.  His head CT demonstrated no evidence of acute stroke but did demonstrate evidence of possible normal pressure hydrocephalus.    Hospital Course:  Acute delirium: He was initially thought to have alcohol withdrawal however it was reported that he had not had alcohol for 10 weeks prior to admission. 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  Ammonia level was 20. 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 This would explain his confusion, urinary incontinence, and his gait instability. The case was discussed with neurology who recommended that he follow-up with his outpatient neurologist Dr. Posey Pronto for further evaluation. His pain medications were tapered some which improved his alertness, however he continued to have confusion. He was started on a scopolamine patch but this can cause worsening altered mental status and so this was also discontinued. Recommend that he have ongoing evaluation by neurology as an outpatient and daily thiamine.  Chronic alcoholism, now in remission. He did not have evidence of alcohol withdrawal. Recommend continuing daily thiamine.  ADHD, his Adderall was initially held but was resumed via his PEG tube.  Hypothyroidism, monitored by his primary care doctor, and he continued his levothyroxine.  Severe protein calorie malnutrition secondary to dysphagia from tongue cancer. He was seen by nutrition who recommended the tube feeds that he  is receiving at the time of discharge. He was also seen by speech therapy and underwent a modified barium swallow on 6/1 which demonstrated evidence of aspiration. The speech therapist has recommended that he be nothing by mouth except for ice chips  after oral care and some free water after oral care. See above for specific recommendations. All other medications, fluids, nutrition should be placed to his PEG tube. He needs oral care with swabs about every 2 hours to prevent mouth dryness and dental deterioration.  Hyponatremia, low serum and urine all zones with elevated urine sodium suggestive of SIADH. His free water was decreased, however his BUN and again to rise suggesting that he was becoming mildly dehydrated. He had a normal cosyntropin stim test, his TSH was within normal limits. He has no evidence of underlying cirrhosis based on blood work and ammonia level. He had no extremity edema to suggest heart failure. I have increased his free water 160 mL before and after each tube feed and he will need a repeat BMP in approximately 1 week.  I think he may have chronically mildly low sodium levels for now, however they do not believe that this is causing his change in mentation. His sodium at the time of discharge is 131.  Hx of TIA, continue daily aspirin and plavix and statin  Procedures:  CT head  Modified barium swallow on 6/1  Consultations:  Oncology  Neurology by phone  Discharge Exam: Filed Vitals:   06/03/14 0621  BP: 132/69  Pulse: 92  Temp: 97.7 F (36.5 C)  Resp: 20   Filed Vitals:   06/02/14 0500 06/02/14 0612 06/02/14 2025 06/03/14 0621  BP:  117/59 134/67 132/69  Pulse:  98 94 92  Temp:  98.7 F (37.1 C) 98 F (36.7 C) 97.7 F (36.5 C)  TempSrc:  Axillary Axillary Oral  Resp:  18 20 20   Height:      Weight: 83.915 kg (185 lb)     SpO2:  96% 96% 99%     General: Awake, conversant, confused, in nad, lying in bed with mitts on hands  Cardiovascular: RRR, s1, s2, no mrg  Respiratory: normal resp effort, no wheezing, rales, or rhonchi  Abdomen: soft, pos BS, ND/NT  Musculoskeletal: perfused, no clubbing, no cyanosis  Neuro:  Limited by patient's confusion and compliance with exam.  CN II-XII  grossly intact, mild dysmetria, no obvious tremor, strength 4/5 throughout  Discharge Instructions      Discharge Instructions    Call MD for:  difficulty breathing, headache or visual disturbances    Complete by:  As directed      Call MD for:  extreme fatigue    Complete by:  As directed      Call MD for:  hives    Complete by:  As directed      Call MD for:  persistant dizziness or light-headedness    Complete by:  As directed      Call MD for:  persistant nausea and vomiting    Complete by:  As directed      Call MD for:  severe uncontrolled pain    Complete by:  As directed      Call MD for:  temperature >100.4    Complete by:  As directed      Increase activity slowly    Complete by:  As directed             Medication List    STOP  taking these medications        fentaNYL 25 MCG/HR patch  Commonly known as:  DURAGESIC - dosed mcg/hr     fluconazole 100 MG tablet  Commonly known as:  DIFLUCAN     lidocaine 2 % solution  Commonly known as:  XYLOCAINE     lidocaine-prilocaine cream  Commonly known as:  EMLA     LORazepam 0.5 MG tablet  Commonly known as:  ATIVAN     multivitamin with minerals Tabs tablet     prochlorperazine 10 MG tablet  Commonly known as:  COMPAZINE     RADIAGEL Gel     ranitidine 150 MG/10ML syrup  Commonly known as:  ZANTAC     sodium fluoride 1.1 % Gel dental gel  Commonly known as:  FLUORISHIELD     sucralfate 1 G tablet  Commonly known as:  CARAFATE      TAKE these medications        amphetamine-dextroamphetamine 20 MG tablet  Commonly known as:  ADDERALL  Place 1 tablet (20 mg total) into feeding tube 2 (two) times daily.     antiseptic oral rinse 0.05 % Liqd solution  Commonly known as:  CPC / CETYLPYRIDINIUM CHLORIDE 0.05%  7 mLs by Mouth Rinse route 2 times daily at 12 noon and 4 pm.     aspirin 81 MG tablet  Place 1 tablet (81 mg total) into feeding tube daily.     atorvastatin 20 MG tablet  Commonly known  as:  LIPITOR  Place 1 tablet (20 mg total) into feeding tube daily at 6 PM.     chlorhexidine 0.12 % solution  Commonly known as:  PERIDEX  15 mLs by Mouth Rinse route 2 (two) times daily.     clopidogrel 75 MG tablet  Commonly known as:  PLAVIX  Place 1 tablet (75 mg total) into feeding tube daily.     emollient cream  Commonly known as:  BIAFINE  Apply topically as needed (radiation burn).     feeding supplement (GLUCERNA 1.2 CAL) Liqd  Place 474 mLs into feeding tube 3 (three) times daily. Use 126mL free water before and after each tube feed     feeding supplement (PRO-STAT SUGAR FREE 64) Liqd  Place 30 mLs into feeding tube 3 (three) times daily.     levothyroxine 75 MCG tablet  Commonly known as:  SYNTHROID, LEVOTHROID  Place 1 tablet (75 mcg total) into feeding tube daily before breakfast.     morphine CONCENTRATE 10 MG/0.5ML Soln concentrated solution  Place 0.5 mLs (10 mg total) into feeding tube every 2 (two) hours as needed for moderate pain, severe pain or shortness of breath.     ondansetron 8 MG tablet  Commonly known as:  ZOFRAN  Place 1 tablet (8 mg total) into feeding tube every 8 (eight) hours as needed for nausea or vomiting.     thiamine 100 MG tablet  Commonly known as:  VITAMIN B-1  Place 1 tablet (100 mg total) into feeding tube daily. Take 1 tablet TID for one week, then 1 tablet daily thereafter       Follow-up Information    Follow up with Kandice Hams, MD. Schedule an appointment as soon as possible for a visit in 1 week.   Specialty:  Internal Medicine   Contact information:   [address] [place] [address] [phone]       Follow up with PATEL, DONIKA, DO. Schedule an appointment  as soon as possible for a visit in 2 weeks.   Specialty:  Neurology   Contact information:   [place] [place] [address] [phone]       Follow up with [facility], NI, MD. Schedule an appointment as soon as  possible for a visit in 2 weeks.   Specialty:  Hematology and Oncology   Contact information:   [place] [address] [phone]       Follow up with Eppie Gibson, MD In 2 weeks.   Specialty:  Radiation Oncology   Contact information:   [address] [address] [phone]        The results of significant diagnostics from this hospitalization (including imaging, microbiology, ancillary and laboratory) are listed below for reference.    Significant Diagnostic Studies: Ct Head Wo Contrast  05/25/2014   CLINICAL DATA:  Increasing confusion today, history of squamous cell carcinoma of the tongue  EXAM: CT HEAD WITHOUT CONTRAST  TECHNIQUE: Contiguous axial images were obtained from the base of the skull through the vertex without intravenous contrast.  COMPARISON:  12/29/2012  FINDINGS: Bony calvarium is intact. No gross soft tissue abnormality is noted. Prominence of the lateral ventricles is again identified and stable. No findings to suggest acute hemorrhage, acute infarction or space-occupying mass lesion are noted.  IMPRESSION: Stable ventricular prominence.  No acute abnormality is noted.   Electronically Signed   By: Inez Catalina M.D.   On: 05/25/2014 16:35   Ct Head W Wo Contrast  05/27/2014   CLINICAL DATA:  Tongue cancer.  Tongue swelling.  EXAM: CT HEAD WITHOUT AND WITH CONTRAST  TECHNIQUE: Contiguous axial images were obtained from the base of the skull through the vertex without and with intravenous contrast  CONTRAST:  150mL OMNIPAQUE IOHEXOL 300 MG/ML  SOLN  COMPARISON:  CT head 05/25/2014  FINDINGS: Moderate ventricular enlargement appears slightly larger compared with the prior study. Prominent aqueduct. 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  Negative for acute infarct.  Negative for hemorrhage or mass.  No enhancing lesions identified following contrast infusion  Negative for skull lesion  Image quality degraded by motion.  IMPRESSION:  Ventricular enlargement slightly more prominent. Findings suggest communicating hydrocephalus  Negative for metastatic disease or acute infarct.   Electronically Signed   By: Franchot Gallo M.D.   On: 05/27/2014 16:18   Ct Soft Tissue Neck W Contrast  05/27/2014   CLINICAL DATA:  Squamous cell carcinoma tongue.  Tongue swelling.  EXAM: CT NECK WITH CONTRAST  TECHNIQUE: Multidetector CT imaging of the neck was performed using the standard protocol following the bolus administration of intravenous contrast.  CONTRAST:  158mL OMNIPAQUE IOHEXOL 300 MG/ML  SOLN  COMPARISON:  PET-CT 03/18/2014  FINDINGS: Pharynx and larynx: Prior PET scan revealed a mass in the base of tongue in the midline which was ulcerated. This appears improved without mass lesion in the area post treatment. Epiglottis and larynx are negative.  Salivary glands: Negative  Thyroid: Negative  Lymph nodes: Necrotic lymph node in the left mid mid neck is slightly smaller now measuring 3.9 x 2.5 mm. Posterior level 4 lymph node also smaller now measuring 16 mm. Right level 2 lymph node measures 11 mm slightly smaller. No new adenopathy.  Mild skin edema related to radiation treatment.  Vascular: Carotid artery patent bilaterally. Jugular vein patent bilaterally. Right-sided Port-A-Cath.  Limited intracranial: Negative  Visualized orbits: Not visualized  Mastoids and visualized paranasal sinuses: Mild mucosal edema  right maxillary sinus. Mastoid sinus clear bilaterally.  Skeleton: Cervical spondylosis.  No bony lesion.  Upper chest: Negative for mass or lung nodule.  IMPRESSION: Cervical adenopathy has improved since 03/18/2014 related to cancer treatment. Large necrotic node on the left is smaller. Additional posterior lymph node on the left is smaller as well. Small right level 2 lymph node slightly smaller. No new adenopathy  Tongue base mass difficult to see on the current study.   Electronically Signed   By: Franchot Gallo M.D.   On: 05/27/2014  15:58   Dg Chest Port 1 View  05/28/2014   CLINICAL DATA:  Cough and shortness of breath.  Tongue cancer.  EXAM: PORTABLE CHEST - 1 VIEW  COMPARISON:  03/02/2014  FINDINGS: Right IJ Port-A-Cath has tip at the cavoatrial junction. Lungs are hypoinflated with mild linear density over the lateral left base and right midlung likely atelectasis. No pneumothorax. Minimal prominence of the perihilar vessels suggesting minimal vascular congestion. Cardiomediastinal silhouette is within normal. Remainder of the exam is unchanged.  IMPRESSION: Suggestion of minimal vascular congestion with mild linear atelectasis over the right midlung and left base.   Electronically Signed   By: Marin Olp M.D.   On: 05/28/2014 12:57   Dg Swallowing Func-speech Pathology  06/02/2014      Objective Swallowing Evaluation:    Patient Details  Name: Dylan Moreno MRN: [id number] Date of Birth: April 09, 1940  Today's Date: 06/02/2014 Time: SLP Start Time (ACUTE ONLY): 1130-SLP Stop Time (ACUTE ONLY): 1210 SLP Time Calculation (min) (ACUTE ONLY): 40 min  Past Medical History:  Past Medical History  Diagnosis Date  . Hyperlipemia   . Arthritis   . ADHD (attention deficit hyperactivity disorder)   . Wears glasses   . Hypercholesterolemia   . Peyronie's disease   . Kidney stones   . TIA (transient ischemic attack)   . Hypertension   . Anxiety   . GERD (gastroesophageal reflux disease)   . 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 TIA (transient ischemic attack) 12/29/12    left facial droop   Past Surgical History:  Past Surgical History  Procedure Laterality Date  . Dupuytren contracture release  2012    left hand  . Tonsillectomy    . Shoulder arthroscopy w/ rotator cuff repair      left  . Colonoscopy    . Cystoscopy w/ ureteroscopy w/ lithotripsy  2011  . Hand surgery      right  . Dupuytren contracture release Right 10/30/2012    Procedure: DUPUYTREN CONTRACTURE RELEASE RIGHT PALM,RING AND SMALL  FINGER;  Surgeon: Cammie Sickle., MD;  Location: [facility];  Service: Orthopedics;  Laterality: Right;   HPI:  Other Pertinent Information: 74 yo male adm to [facility] with AMS on 05/25/14 -  suspected due to ETOH w/d, Wernicke's encephalopathy.  Past medical  history of Hyperlipemia; Arthritis; ADHD, Hypercholesterolemia; Peyronie's  disease; Kidney stones; TIA, Hypertension; Anxiety; GERD  (gastroesophageal reflux disease); CA of base of tongue (03/05/14).  Pt has  been undergoing chemoradiation tx.  PEG tube placed for nutrition prior to  initiation of cancer tx.  Swallow evaluation ordered.    Subjective: Pt's rad tx to end May 23rd. Is beginning 4th week rad. "How  many of these exercises you want me to do?"  Assessment / Plan / Recommendation CHL IP CLINICAL IMPRESSIONS 06/02/2014  Therapy Diagnosis Mild oral phase dysphagia;Moderate pharyngeal phase  dysphagia;Severe pharyngeal phase dysphagia  Clinical Impression Mild oral dysphagia due to mild weakness resulting in  delayed oral transiting and oral holding.    Moderately severe pharyngeal dysphagia characterized by decreased  laryngeal elevation/closure and poor tongue base retraction presumed to be  due to XRT.   Pt did not adequately sense gross vallecular residuals and  could not fully clear with dry or liquid swallows.  Pt also could not  cough/expectorate adequately to clear vallecular residuals.  Multiple  reflexive swallows noted x2 with extended breath hold during MBS.   Laryngeal penetration to vocal folds apparent with thin.    Pt is high aspiration risk with po at this time due to weakness,  dysphagia.  Recommend he be allowed ice chips and free water after oral  care to decrease disuse muscle atrophy and fibrosis.  Furhter recommend pt  undergo skilled SLP tx at SNF to maximize swallow rehabiliation.  Using  live video, provided feedback for pt.  Encouraged hiim to work on strength  of cough and expectoration.  Pt verbalized understanding but required max   cues to atttend to task during MBS.            CHL IP TREATMENT RECOMMENDATION 06/02/2014  Treatment Recommendations Therapy as outlined in treatment plan below     CHL IP DIET RECOMMENDATION 06/02/2014  SLP Diet Recommendations Ice chips PRN after oral care;Free water protocol  after oral care;NPO  Liquid Administration via (None)  Medication Administration Via alternative means  Compensations Minimize environmental distractions  Postural Changes and/or Swallow Maneuvers (None)     CHL IP OTHER RECOMMENDATIONS 06/02/2014  Recommended Consults (None)  Oral Care Recommendations Oral care QID  Other Recommendations (None)     No flowsheet data found.   CHL IP FREQUENCY AND DURATION 06/02/2014  Speech Therapy Frequency (ACUTE ONLY) min 2x/week  Treatment Duration 1 week      CHL IP REASON FOR REFERRAL 06/02/2014  Reason for Referral Objectively evaluate swallowing function     CHL IP ORAL PHASE 06/02/2014  Oral Phase Impaired      CHL IP PHARYNGEAL PHASE 06/02/2014  Pharyngeal Phase Impaired  Pharyngeal Comment vallecular residuals noted across consistencies, worse  with increased viscocity, pt with inconsistent sensation/awareness and did  not clear residuals with cued swallows, liquid swallows or expectoration      Luanna Salk, MS [facility] SLP [phone]     Microbiology: Recent Results (from the past 240 hour(s))  TECHNOLOGIST REVIEW     Status: None   Collection Time: 05/24/14  3:41 PM  Result Value Ref Range Status   Technologist Review 2% Myelocytes  Final     Labs: Basic Metabolic Panel:  Recent Labs Lab 05/30/14 0500  05/31/14 0245 05/31/14 0600 06/01/14 0438 06/02/14 0435 06/03/14 0545  NA 127*  < > 129* 129* 131* 133* 131*  K 4.0  --   --  4.3 4.4 4.9 4.3  CL 92*  --   --  94* 94* 96* 96*  CO2 26  --   --  25 27 26 26   GLUCOSE 122*  --   --  121* 130* 134* 114*  BUN 19  --   --  23* 30* 35* 32*  CREATININE 0.71  --   --  0.61 0.69 0.81 0.74  CALCIUM 8.8*  --   --  9.2 9.4 9.5 9.2  < > = values in  this interval not displayed. Liver Function Tests:  Recent Labs Lab 05/30/14 0500 05/31/14  0600  AST 40 49*  ALT 47 66*  ALKPHOS 96 106  BILITOT 0.6 0.4  PROT 6.6 6.7  ALBUMIN 2.8* 2.8*   No results for input(s): LIPASE, AMYLASE in the last 168 hours.  Recent Labs Lab 05/27/14 1953  AMMONIA 20   CBC:  Recent Labs Lab 05/29/14 0623 05/31/14 0600 06/03/14 0545  WBC 8.8 7.1 10.4  HGB 9.7* 10.2* 10.0*  HCT 27.9* 29.0* 30.3*  MCV 83.3 84.1 86.3  PLT 360 397 454*   Cardiac Enzymes: No results for input(s): CKTOTAL, CKMB, CKMBINDEX, TROPONINI in the last 168 hours. BNP: BNP (last 3 results) No results for input(s): BNP in the last 8760 hours.  ProBNP (last 3 results) No results for input(s): PROBNP in the last 8760 hours.  CBG:  Recent Labs Lab 06/02/14 1220 06/02/14 1648 06/02/14 2019 06/03/14 0020 06/03/14 0631  GLUCAP 148* 122* 144* 112* 102*    Time coordinating discharge: 35 minutes  Signed:  Tate Jerkins  [facility] 06/03/2014, 10:34 AM

## 2014-06-02 NOTE — Progress Notes (Signed)
Patient agreed to take the bolus feeds - Glucerna 1.2 via PEG. Debbie his business partner had helped me to convince him. Tolerated the TF, no residulas noted.

## 2014-06-02 NOTE — Clinical Social Work Placement (Signed)
   CLINICAL SOCIAL WORK PLACEMENT  NOTE  Date:  06/02/2014  Patient Details  Name: Dylan Moreno MRN: [id number] Date of Birth: April 29, 1940  Clinical Social Work is seeking post-discharge placement for this patient at the [place] level of care (*CSW will initial, date and re-position this form in  chart as items are completed):  Yes   Patient/family provided with [facility] Work Department's list of facilities offering this level of care within the geographic area requested by the patient (or if unable, by the patient's family).      Patient/family informed of their freedom to choose among providers that offer the needed level of care, that participate in Medicare, Medicaid or managed care program needed by the patient, have an available bed and are willing to accept the patient.  Yes   Patient/family informed of [facility]'s ownership interest in [facility] and [facility], as well as of the fact that they are under no obligation to receive care at these facilities.  PASRR submitted to EDS on 06/02/14     PASRR number received on 06/02/14     Existing PASRR number confirmed on       FL2 transmitted to all facilities in geographic area requested by pt/family on 06/02/14     FL2 transmitted to all facilities within larger geographic area on       Patient informed that his/her managed care company has contracts with or will negotiate with certain facilities, including the following:            Patient/family informed of bed offers received.  Patient chooses bed at       Physician recommends and patient chooses bed at      Patient to be transferred to   on  .  Patient to be transferred to facility by       Patient family notified on   of transfer.  Name of family member notified:        PHYSICIAN Please sign FL2     Additional Comment:    _______________________________________________ Ladell Pier, LCSW 06/02/2014, 3:55  PM

## 2014-06-02 NOTE — Clinical Social Work Note (Signed)
Clinical Social Work Assessment  Patient Details  Name: Dylan Moreno MRN: [id number] Date of Birth: [birth year]  Date of referral:  06/02/14               Reason for consult:  Discharge Planning                Permission sought to share information with:  Family Supports Permission granted to share information::  No (pt oriented to person only. Spoke with pt wife at bedside)  Name::     Kerron Sedano  Agency::     Relationship::  wife  Contact Information:  [phone]  Housing/Transportation Living arrangements for the past 2 months:  Single Family Home Source of Information:  Spouse Patient Interpreter Needed:  None Criminal Activity/Legal Involvement Pertinent to Current Situation/Hospitalization:  No - Comment as needed Significant Relationships:  Spouse, Adult Children Lives with:  Spouse Do you feel safe going back to the place where you live?  No Need for family participation in patient care:  Yes (Comment)  Care giving concerns:  Pt from home with pt wife. Pt with acute delirium. Pt wife does not feel pt can return home.    Social Worker assessment / plan:  CSW received referral for New SNF.   CSW met with pt wife at bedside. Pt out of room for modified barium swallow at this time. CSW introduced self and explained role. Pt wife reports that pt lived at home with her prior to admission. Pt wife reports that approximately one week before admission that pt became increasingly confused and wandered around the house. CSW discussed with pt wife recommendation for SNF upon discharge. Pt wife is agreeable to [facility] search. CSW explained to pt wife that SNF facilities may ask for pt family to provide 24 hour care for pt at facility given pt confusion as SNF does not provide one on one care. Pt wife expressed understanding.   CSW completed FL2 and initiated SNF search to [facility].  CSW to follow up with pt wife regarding SNF bed offers.   CSW to continue to  follow to provide support and assist with pt disposition needs.   Employment status:  Retired Forensic scientist:  Medicare PT Recommendations:  [place] / Referral to community resources:  [place]  Patient/Family's Response to care:  Pt out of room at this time for Modified Barium Swallow Evaluation. Per chart, pt oriented to person only. Pt wife supportive and involved in pt care. Pt wife recognizes that SNF facilities may ask for pt family to provide additional assistance as pt confusion may be a barrier at SNF.   Patient/Family's Understanding of and Emotional Response to Diagnosis, Current Treatment, and Prognosis:  Pt wife has a good understanding of pt diagnosis and treatment plan. Pt wife is hopeful that pt will continue to progress and hopefully not be as confused once he is able to get into a routine at SNF.   Emotional Assessment Appearance:  Appears older than stated age Attitude/Demeanor/Rapport:  Unable to Assess Affect (typically observed):  Unable to Assess Orientation:  Oriented to Self Alcohol / Substance use:  Not Applicable Psych involvement (Current and /or in the community):  No (Comment)  Discharge Needs  Concerns to be addressed:  Discharge Planning Concerns Readmission within the last 30 days:  No Current discharge risk:  Physical Impairment Barriers to Discharge:  Continued Medical Work up   Alison Murray A, LCSW 06/02/2014, 3:48 PM  [phone]
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
## 2014-06-02 NOTE — Evaluation (Signed)
Clinical/Bedside Swallow Evaluation Patient Details  Name: Dylan Moreno MRN: [id number] Date of Birth: [birth year]  Today's Date: 06/02/2014 Time: SLP Start Time (ACUTE ONLY): 0840 SLP Stop Time (ACUTE ONLY): 0925 SLP Time Calculation (min) (ACUTE ONLY): 45 min  Past Medical History:  Past Medical History  Diagnosis Date  . Hyperlipemia   . Arthritis   . ADHD (attention deficit hyperactivity disorder)   . Wears glasses   . Hypercholesterolemia   . Peyronie's disease   . Kidney stones   . TIA (transient ischemic attack)   . Hypertension   . Anxiety   . GERD (gastroesophageal reflux disease)   . 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 TIA (transient ischemic attack) 12/29/12    left facial droop   Past Surgical History:  Past Surgical History  Procedure Laterality Date  . Dupuytren contracture release  2012    left hand  . Tonsillectomy    . Shoulder arthroscopy w/ rotator cuff repair      left  . Colonoscopy    . Cystoscopy w/ ureteroscopy w/ lithotripsy  2011  . Hand surgery      right  . Dupuytren contracture release Right 10/30/2012    Procedure: DUPUYTREN CONTRACTURE RELEASE RIGHT PALM,RING AND SMALL FINGER;  Surgeon: Cammie Sickle., MD;  Location: [place];  Service: Orthopedics;  Laterality: Right;   HPI:  74 yo male adm to [facility] with AMS on 05/25/14 - suspected due to ETOH w/d, Wernicke's encephalopathy.  Past medical history of Hyperlipemia; Arthritis; ADHD, Hypercholesterolemia; Peyronie's disease; Kidney stones; TIA, Hypertension; Anxiety; GERD (gastroesophageal reflux disease); CA of base of tongue (03/05/14).  Pt has been undergoing chemoradiation tx.  PEG tube placed for nutrition prior to initiation of cancer tx.  Swallow evaluation ordered.     Assessment / Plan / Recommendation Clinical Impression  Pt's current swallow ability concerning for aspiration, pharyngeal retention secondary to tongue base cancer and  treatment, xerostomia and deconditioning.  Intermittent wet vocal quality noted with oral care that cleared with cued cough - productive to viscous secretions.    Oral care including dental brushing to maximize pt's comfort/hygeine.  SLP provided intake of ice chips, tsps room temp water and applesauce - pt indicates discomfort with swallow by wincing - ? due to radiation tx.   Delayed cough and wet voice concerning for possible aspiration/residuals that pt may not sense due to XRT.   Pt has also not had PO since admission and is very xerostomic and this may contribute to discomfort.  Pt did not expound on information re: discomfort.  Advised him to cough/clear throat and expectorate if voice is wet due to suspected aspiration of secretions at that point.    Recommend proceed with MBS to allow instrumental swallow evaluation to determine if pt appropriate for po intake.   Please note, pt had been seen by OP SLP on 04/19/14 and at that time was on a soft/thin diet.      Aspiration Risk  Severe    Diet Recommendation NPO (pending MBS)   Medication Administration: Via alternative means    Other  Recommendations     Follow Up Recommendations       Frequency and Duration min 1 x/week  1 week   Pertinent Vitals/Pain Afebrile, decreased      Swallow Study Prior Functional Status   see HHX    General Date of Onset: 06/02/14 Other Pertinent Information: 74 yo male  adm to [facility] with AMS on 05/25/14 - suspected due to ETOH w/d, Wernicke's encephalopathy.  Past medical history of Hyperlipemia; Arthritis; ADHD, Hypercholesterolemia; Peyronie's disease; Kidney stones; TIA, Hypertension; Anxiety; GERD (gastroesophageal reflux disease); CA of base of tongue (03/05/14).  Pt has been undergoing chemoradiation tx.  PEG tube placed for nutrition prior to initiation of cancer tx.  Swallow evaluation ordered.   Type of Study: Bedside swallow evaluation Diet Prior to this Study: NPO;PEG tube Temperature  Spikes Noted: No Respiratory Status: Room air Behavior/Cognition: Alert;Cooperative Oral Cavity - Dentition: Adequate natural dentition/normal for age Self-Feeding Abilities: Total assist Patient Positioning: Upright in bed Baseline Vocal Quality: Low vocal intensity;Hoarse (occasionally wet, cleared with cued cough and expectoration of viscous secretions) Volitional Cough: Strong Volitional Swallow: Unable to elicit (pt xerostomic)    Oral/Motor/Sensory Function Overall Oral Motor/Sensory Function: Impaired Lingual ROM:  (deviation to left upon protrusion) Lingual Strength: Reduced Velum:  (impaired, sluggish velar elevation)   Ice Chips Ice chips: Impaired Presentation: Spoon Oral Phase Impairments: Reduced lingual movement/coordination;Impaired anterior to posterior transit Oral Phase Functional Implications: Prolonged oral transit Pharyngeal Phase Impairments: Suspected delayed Swallow Other Comments: pt complains of "pain" with swallowing and appears startled   Thin Liquid Thin Liquid: Impaired Presentation: Spoon Oral Phase Impairments: Reduced lingual movement/coordination;Impaired anterior to posterior transit Pharyngeal  Phase Impairments: Suspected delayed Swallow;Cough - Delayed Other Comments: productive cough clearing viscous secretions that were suspected to be retained in pharynx    Nectar Thick Nectar Thick Liquid: Not tested   Honey Thick Honey Thick Liquid: Not tested   Puree Puree: Impaired Presentation: Spoon Oral Phase Impairments: Reduced lingual movement/coordination;Impaired anterior to posterior transit Oral Phase Functional Implications: Prolonged oral transit Pharyngeal Phase Impairments: Suspected delayed Swallow   Solid   GO    Solid: Not tested Other Comments: secondary to pt's dysarthria       Luanna Salk, [facility] SLP [phone]

## 2014-06-02 NOTE — Progress Notes (Signed)
CHL IP CLINICAL IMPRESSIONS 06/02/2014  Therapy Diagnosis Mild oral phase dysphagia;Moderate pharyngeal phase dysphagia;Severe pharyngeal phase dysphagia  Clinical Impression Mild oral dysphagia due to mild weakness resulting in delayed oral transiting and oral holding.    Moderately severe pharyngeal dysphagia characterized by decreased laryngeal elevation/closure and poor tongue base retraction presumed to be due to XRT.   Pt did not adequately sense gross vallecular residuals and could not fully clear with dry or liquid swallows.  Pt also could not cough/expectorate adequately to clear vallecular residuals.  Multiple reflexive swallows noted x2 with extended breath hold during MBS.  Laryngeal penetration to vocal folds apparent with thin.    Pt is high aspiration risk with po at this time due to weakness, dysphagia.  Recommend he be allowed ice chips and free water after oral care to decrease disuse muscle atrophy and fibrosis.  Furhter recommend pt undergo skilled SLP tx at SNF to maximize swallow rehabiliation.  Using live video, provided feedback for pt.  Encouraged hiim to work on strength of cough and expectoration.  Pt verbalized understanding but required max cues to atttend to task during MBS.           CHL IP TREATMENT RECOMMENDATION 06/02/2014  Treatment Recommendations Therapy as outlined in treatment plan below    CHL IP DIET RECOMMENDATION 06/02/2014  SLP Diet Recommendations Ice chips PRN after oral care;Free water protocol after oral care;NPO  Liquid Administration via Cup, tsp  Medication Administration Via alternative means  Compensations Minimize environmental distractions  Postural Changes and/or Swallow Maneuvers Multiple swallows, cough and expectorate as needed     Full report in imaging section.   Luanna Salk, [facility] SLP [phone]

## 2014-06-02 NOTE — Progress Notes (Signed)
[facility] PROGRESS NOTE  Dylan Moreno WGN:[id number] DOB: 23-Aug-1940 DOA: 05/25/2014 PCP: Kandice Hams, MD  Off Service Summary: 74yo with a hx of tongue cancer who completed radiation tx who presents with acute mental status and lethargy. There are no signs of active infection. Patient had been continued with chronic narcotics in the form of a fentanyl patch as well as PRN morphine. These have been weaned to off and the patient's mentation has since improved, although he still remains confused at times.   Oncology is following   Assessment/Plan: 1. Acute delirium 1. Patient was initially thought to have possible ETOH withdrawal, however pt has been without ETOH for nearly 10 weeks making ETOH withdrawal less likely. Pt had been continued on CIWA. 2. Head CT w/o contrast on admit was unremarkable 3. Follow up head CT with contrast showed worsening hydrocephalus. Pt's wife later reported shuffling/unsteady gait prior to admit. Have discussed case with Neurology who recommends outpatient Neurologic work up for NPH when discharged 4. Thiamine, folic acid, H84 levels were unremarkable 5. Suspecting polypharmacy as pt's mentation has showed marked improvement after fentanyl patch was weaned to off 6. Still very confused and likely becoming dehydrated 2. Chronic alcoholism 1. On CIWA per above, although doubt etoh withdrawal 3. ADHD 1. Adderall was resumed this admission 4. Severe protein calorie malnutrition 1. Nutrition was consulted 2. Cont with nutritional supplement as tolerated 3. SLP consulted and awaiting MBS results 4. Continue tube feeds  5. Hypothyroid 1. Cont with thyroid replacement as tolerated 2. TSH is w/in normal limits 6. Hx CVA 1. Presenting head non-contrast CT is unremarkable 2. Follow up CT head w/wo contrast unremarkable for acute process or new infarct 7. Malignant tongue cancer 1. Followed by Oncology and Rad Onc 2. CT neck w/ contrast had  demonstrated some improvement in pt's malignancy following recent radiation tx 8. DVT prophylaxis 1. Lovenox subQ 9. Hyponatremia, sodium rising but BUN and creatinine also starting to rise 1. Sodium had been in the 120's 2. Pt with low serum and urine osm with elevated urine Na 3. Increase free water to 122mL before and after tube feeds 4. Normal cosyntropin stim test 5. Sodium improved to 133 6. Repeat in AM after adjusting free water  Code Status: Full Family Communication: Pt in room, called and left a message for patient's wife. Disposition Plan:  Anticipate to SNF tomorrow pending results of MBS and sodium level tomorrow morning  Consultants:  Oncology  Neurology (over phone)  Procedures:  MBS on 6/1  Antibiotics:   none  HPI/Subjective: Patient is without complaints today, wants a newspaper.  Denies pain, SOB, nausea  Objective: Filed Vitals:   06/01/14 1415 06/01/14 2116 06/02/14 0500 06/02/14 0612  BP: 140/56 150/83  117/59  Pulse: 103 106  98  Temp: 97.3 F (36.3 C) 97.8 F (36.6 C)  98.7 F (37.1 C)  TempSrc: Axillary Axillary  Axillary  Resp: 18 18  18   Height:      Weight:   83.915 kg (185 lb)   SpO2: 96%   96%    Intake/Output Summary (Last 24 hours) at 06/02/14 1249 Last data filed at 06/02/14 1100  Gross per 24 hour  Intake    220 ml  Output   1525 ml  Net  -1305 ml   Filed Weights   05/31/14 0526 06/01/14 0448 06/02/14 0500  Weight: 85.276 kg (188 lb) 81.285 kg (179 lb 3.2 oz) 83.915 kg (185 lb)    Exam:  General:  Awake, conversant, confused, in nad  Cardiovascular: regular, s1, s2  Respiratory: normal resp effort, no wheezing  Abdomen: soft, pos BS, ND/NT  Musculoskeletal: perfused, no clubbing, no cyanosis, mitt restrants in place   Data Reviewed: Basic Metabolic Panel:  Recent Labs Lab 05/29/14 0623  05/30/14 0500  05/30/14 2300 05/31/14 0245 05/31/14 0600 06/01/14 0438 06/02/14 0435  NA 125*  < > 127*  < >  128* 129* 129* 131* 133*  K 4.0  --  4.0  --   --   --  4.3 4.4 4.9  CL 92*  --  92*  --   --   --  94* 94* 96*  CO2 24  --  26  --   --   --  25 27 26   GLUCOSE 116*  --  122*  --   --   --  121* 130* 134*  BUN 15  --  19  --   --   --  23* 30* 35*  CREATININE 0.64  --  0.71  --   --   --  0.61 0.69 0.81  CALCIUM 8.5*  --  8.8*  --   --   --  9.2 9.4 9.5  < > = values in this interval not displayed. Liver Function Tests:  Recent Labs Lab 05/30/14 0500 05/31/14 0600  AST 40 49*  ALT 47 66*  ALKPHOS 96 106  BILITOT 0.6 0.4  PROT 6.6 6.7  ALBUMIN 2.8* 2.8*   No results for input(s): LIPASE, AMYLASE in the last 168 hours.  Recent Labs Lab 05/27/14 1953  AMMONIA 20   CBC:  Recent Labs Lab 05/27/14 0421 05/29/14 0623 05/31/14 0600  WBC 5.4 8.8 7.1  HGB 10.1* 9.7* 10.2*  HCT 30.5* 27.9* 29.0*  MCV 85.4 83.3 84.1  PLT 426* 360 397   Cardiac Enzymes: No results for input(s): CKTOTAL, CKMB, CKMBINDEX, TROPONINI in the last 168 hours. BNP (last 3 results) No results for input(s): BNP in the last 8760 hours.  ProBNP (last 3 results) No results for input(s): PROBNP in the last 8760 hours.  CBG:  Recent Labs Lab 06/01/14 2122 06/02/14 0146 06/02/14 0603 06/02/14 0753 06/02/14 1220  GLUCAP 113* 113* 108* 111* 148*    Recent Results (from the past 240 hour(s))  TECHNOLOGIST REVIEW     Status: None   Collection Time: 05/24/14  3:41 PM  Result Value Ref Range Status   Technologist Review 2% Myelocytes  Final     Studies: Dg Swallowing Func-speech Pathology  06/02/2014      Objective Swallowing Evaluation:    Patient Details  Name: Dylan Moreno MRN: [id number] Date of Birth: [birth year]  Today's Date: 06/02/2014 Time: SLP Start Time (ACUTE ONLY): 1130-SLP Stop Time (ACUTE ONLY): 1210 SLP Time Calculation (min) (ACUTE ONLY): 40 min  Past Medical History:  Past Medical History  Diagnosis Date  . Hyperlipemia   . Arthritis   . ADHD (attention deficit hyperactivity  disorder)   . Wears glasses   . Hypercholesterolemia   . Peyronie's disease   . Kidney stones   . TIA (transient ischemic attack)   . Hypertension   . Anxiety   . GERD (gastroesophageal reflux disease)   . 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 TIA (transient ischemic attack) 12/29/12    left facial droop   Past Surgical History:  Past Surgical History  Procedure Laterality Date  . Dupuytren contracture release  2012    left hand  . Tonsillectomy    . Shoulder arthroscopy w/ rotator cuff repair      left  . Colonoscopy    . Cystoscopy w/ ureteroscopy w/ lithotripsy  2011  . Hand surgery      right  . Dupuytren contracture release Right 10/30/2012    Procedure: DUPUYTREN CONTRACTURE RELEASE RIGHT PALM,RING AND SMALL  FINGER;  Surgeon: Cammie Sickle., MD;  Location: [place];  Service: Orthopedics;  Laterality: Right;   HPI:  Other Pertinent Information: 74 yo male adm to [facility] with AMS on 05/25/14 -  suspected due to ETOH w/d, Wernicke's encephalopathy.  Past medical  history of Hyperlipemia; Arthritis; ADHD, Hypercholesterolemia; Peyronie's  disease; Kidney stones; TIA, Hypertension; Anxiety; GERD  (gastroesophageal reflux disease); CA of base of tongue (03/05/14).  Pt has  been undergoing chemoradiation tx.  PEG tube placed for nutrition prior to  initiation of cancer tx.  Swallow evaluation ordered.    Subjective: Pt's rad tx to end May 23rd. Is beginning 4th week rad. "How  many of these exercises you want me to do?"  Assessment / Plan / Recommendation CHL IP CLINICAL IMPRESSIONS 06/02/2014  Therapy Diagnosis Mild oral phase dysphagia;Moderate pharyngeal phase  dysphagia;Severe pharyngeal phase dysphagia  Clinical Impression Mild oral dysphagia due to mild weakness resulting in  delayed oral transiting and oral holding.    Moderately severe pharyngeal dysphagia characterized by decreased  laryngeal elevation/closure and poor tongue base retraction presumed to be  due to  XRT.   Pt did not adequately sense gross vallecular residuals and  could not fully clear with dry or liquid swallows.  Pt also could not  cough/expectorate adequately to clear vallecular residuals.  Multiple  reflexive swallows noted x2 with extended breath hold during MBS.   Laryngeal penetration to vocal folds apparent with thin.    Pt is high aspiration risk with po at this time due to weakness,  dysphagia.  Recommend he be allowed ice chips and free water after oral  care to decrease disuse muscle atrophy and fibrosis.  Furhter recommend pt  undergo skilled SLP tx at SNF to maximize swallow rehabiliation.  Using  live video, provided feedback for pt.  Encouraged hiim to work on strength  of cough and expectoration.  Pt verbalized understanding but required max  cues to atttend to task during MBS.            CHL IP TREATMENT RECOMMENDATION 06/02/2014  Treatment Recommendations Therapy as outlined in treatment plan below     CHL IP DIET RECOMMENDATION 06/02/2014  SLP Diet Recommendations Ice chips PRN after oral care;Free water protocol  after oral care;NPO  Liquid Administration via (None)  Medication Administration Via alternative means  Compensations Minimize environmental distractions  Postural Changes and/or Swallow Maneuvers (None)     CHL IP OTHER RECOMMENDATIONS 06/02/2014  Recommended Consults (None)  Oral Care Recommendations Oral care QID  Other Recommendations (None)     No flowsheet data found.   CHL IP FREQUENCY AND DURATION 06/02/2014  Speech Therapy Frequency (ACUTE ONLY) min 2x/week  Treatment Duration 1 week      CHL IP REASON FOR REFERRAL 06/02/2014  Reason for Referral Objectively evaluate swallowing function     CHL IP ORAL PHASE 06/02/2014  Oral Phase Impaired      CHL IP PHARYNGEAL PHASE 06/02/2014  Pharyngeal Phase Impaired  Pharyngeal Comment vallecular residuals noted across consistencies, worse  with increased viscocity, pt with  inconsistent sensation/awareness and did  not clear residuals with cued  swallows, liquid swallows or expectoration      Luanna Salk, MS [facility] SLP [phone]     Scheduled Meds: . amphetamine-dextroamphetamine  20 mg Oral BID WC  . antiseptic oral rinse  7 mL Mouth Rinse q12n4p  . aspirin  81 mg Oral Daily  . atorvastatin  20 mg Oral q1800  . chlorhexidine  15 mL Mouth Rinse BID  . clopidogrel  75 mg Oral Q breakfast  . enoxaparin (LOVENOX) injection  40 mg Subcutaneous Q24H  . feeding supplement (GLUCERNA 1.2 CAL)  474 mL Per Tube 3 times per day  . feeding supplement (PRO-STAT SUGAR FREE 64)  30 mL Per Tube TID  . folic acid  1 mg Oral Daily  . levothyroxine  75 mcg Oral QAC breakfast  . multivitamin with minerals  1 tablet Oral Daily  . scopolamine  1 patch Transdermal Q72H  . thiamine  100 mg Oral Daily   Or  . thiamine  100 mg Intravenous Daily      Active Problems:   ADHD (attention deficit hyperactivity disorder)   Malignant neoplasm of base of tongue   Chronic alcoholism   Altered mental status   Protein-calorie malnutrition, severe   Tongue swelling   Hyponatremia   Adith Tejada, [place]  [facility] Pager [phone]. If 7PM-7AM, please contact night-coverage at [email or web address], password [facility] 06/02/2014, 12:49 PM  LOS: 8 days

## 2014-06-03 ENCOUNTER — Ambulatory Visit: Payer: Self-pay

## 2014-06-03 ENCOUNTER — Encounter: Payer: Self-pay | Admitting: *Deleted

## 2014-06-03 DIAGNOSIS — D630 Anemia in neoplastic disease: Secondary | ICD-10-CM

## 2014-06-03 DIAGNOSIS — F05 Delirium due to known physiological condition: Secondary | ICD-10-CM

## 2014-06-03 DIAGNOSIS — T17998D Other foreign object in respiratory tract, part unspecified causing other injury, subsequent encounter: Secondary | ICD-10-CM

## 2014-06-03 DIAGNOSIS — T17908A Unspecified foreign body in respiratory tract, part unspecified causing other injury, initial encounter: Secondary | ICD-10-CM | POA: Insufficient documentation

## 2014-06-03 LAB — CBC
HCT: 30.3 % — ABNORMAL LOW (ref 39.0–52.0)
Hemoglobin: 10 g/dL — ABNORMAL LOW (ref 13.0–17.0)
MCH: 28.5 pg (ref 26.0–34.0)
MCHC: 33 g/dL (ref 30.0–36.0)
MCV: 86.3 fL (ref 78.0–100.0)
Platelets: 454 10*3/uL — ABNORMAL HIGH (ref 150–400)
RBC: 3.51 MIL/uL — ABNORMAL LOW (ref 4.22–5.81)
RDW: 13.3 % (ref 11.5–15.5)
WBC: 10.4 10*3/uL (ref 4.0–10.5)

## 2014-06-03 LAB — GLUCOSE, CAPILLARY
GLUCOSE-CAPILLARY: 102 mg/dL — AB (ref 65–99)
GLUCOSE-CAPILLARY: 132 mg/dL — AB (ref 65–99)
Glucose-Capillary: 112 mg/dL — ABNORMAL HIGH (ref 65–99)

## 2014-06-03 LAB — BASIC METABOLIC PANEL
Anion gap: 9 (ref 5–15)
BUN: 32 mg/dL — ABNORMAL HIGH (ref 6–20)
CO2: 26 mmol/L (ref 22–32)
Calcium: 9.2 mg/dL (ref 8.9–10.3)
Chloride: 96 mmol/L — ABNORMAL LOW (ref 101–111)
Creatinine, Ser: 0.74 mg/dL (ref 0.61–1.24)
GFR calc Af Amer: >60 mL/min (ref >60)
GFR calc non Af Amer: >60 mL/min (ref >60)
Glucose, Bld: 114 mg/dL — ABNORMAL HIGH (ref 65–99)
Potassium: 4.3 mmol/L (ref 3.5–5.1)
SODIUM: 131 mmol/L — AB (ref 135–145)

## 2014-06-03 MED ORDER — PRO-STAT SUGAR FREE PO LIQD: 30.0000 mL | Freq: Three times a day (TID) | ORAL | Status: DC

## 2014-06-03 MED ORDER — ASPIRIN 81 MG PO TABS: 81.0000 mg | ORAL_TABLET | Freq: Every day | ORAL | Status: DC

## 2014-06-03 MED ORDER — VITAMIN B-1 100 MG PO TABS: 100.0000 mg | ORAL_TABLET | Freq: Every day | ORAL | Status: DC

## 2014-06-03 MED ORDER — CHLORHEXIDINE GLUCONATE 0.12 % MT SOLN: 15.0000 mL | Freq: Two times a day (BID) | OROMUCOSAL | Status: DC

## 2014-06-03 MED ORDER — ATORVASTATIN CALCIUM 20 MG PO TABS: 20.0000 mg | ORAL_TABLET | Freq: Every day | ORAL | Status: DC

## 2014-06-03 MED ORDER — LEVOTHYROXINE SODIUM 75 MCG PO TABS: 75.0000 ug | ORAL_TABLET | Freq: Every day | ORAL | Status: DC

## 2014-06-03 MED ORDER — GLUCERNA 1.2 CAL PO LIQD: 474.0000 mL | Freq: Three times a day (TID) | ORAL | Status: DC

## 2014-06-03 MED ORDER — CETYLPYRIDINIUM CHLORIDE 0.05 % MT LIQD: 7.0000 mL | Freq: Two times a day (BID) | OROMUCOSAL | Status: DC

## 2014-06-03 MED ORDER — ONDANSETRON HCL 8 MG PO TABS: 8.0000 mg | ORAL_TABLET | Freq: Three times a day (TID) | ORAL | Status: DC | PRN

## 2014-06-03 MED ORDER — CLOPIDOGREL BISULFATE 75 MG PO TABS: 75.0000 mg | ORAL_TABLET | Freq: Every day | ORAL | Status: AC

## 2014-06-03 MED ORDER — MORPHINE SULFATE (CONCENTRATE) 10 MG/0.5ML PO SOLN: 10.0000 mg | ORAL | Status: DC | PRN

## 2014-06-03 MED ORDER — AMPHETAMINE-DEXTROAMPHETAMINE 20 MG PO TABS: 20.0000 mg | ORAL_TABLET | Freq: Two times a day (BID) | ORAL | Status: DC

## 2014-06-03 NOTE — Clinical Social Work Placement (Signed)
   CLINICAL SOCIAL WORK PLACEMENT  NOTE  Date:  06/03/2014  Patient Details  Name: Dylan Moreno MRN: [id number] Date of Birth: 08/06/1940  Clinical Social Work is seeking post-discharge placement for this patient at the [place] level of care (*CSW will initial, date and re-position this form in  chart as items are completed):  Yes   Patient/family provided with [facility] Work Department's list of facilities offering this level of care within the geographic area requested by the patient (or if unable, by the patient's family).      Patient/family informed of their freedom to choose among providers that offer the needed level of care, that participate in Medicare, Medicaid or managed care program needed by the patient, have an available bed and are willing to accept the patient.  Yes   Patient/family informed of [facility]'s ownership interest in [facility] and [facility], as well as of the fact that they are under no obligation to receive care at these facilities.  PASRR submitted to EDS on 06/02/14     PASRR number received on 06/02/14     Existing PASRR number confirmed on       FL2 transmitted to all facilities in geographic area requested by pt/family on 06/02/14     FL2 transmitted to all facilities within larger geographic area on       Patient informed that his/her managed care company has contracts with or will negotiate with certain facilities, including the following:        Yes   Patient/family informed of bed offers received.  Patient chooses bed at [facility]     Physician recommends and patient chooses bed at      Patient to be transferred to [facility] on 06/03/14.  Patient to be transferred to facility by ambulance Corey Harold)     Patient family notified on 06/03/14 of transfer.  Name of family member notified:  pt wife, Vicente Males notified via telephone     PHYSICIAN Please sign  FL2     Additional Comment:    _______________________________________________ Ladell Pier, LCSW 06/03/2014, 12:41 PM

## 2014-06-03 NOTE — Progress Notes (Signed)
Pt for discharge to [facility].   CSW facilitated pt discharge needs including contacting facility, faxing pt discharge information via [facility], notifying pt wife via telephone, pt friend, Jackelyn Poling at bedside to support pt during transfer, providing RN phone number to call report, and arranging ambulance transport via [facility] for pt to [facility].   Pt continues to experience a lot of confusion and pt wife has arranged for pt friend, Jackelyn Poling to assist with transition.   No further social work needs identified at this time.  CSW signing off.   Alison Murray, MSW, [place] Work [phone]

## 2014-06-03 NOTE — Progress Notes (Signed)
Pt discharged to [facility] in stable condition.  Report called to facility.

## 2014-06-03 NOTE — Progress Notes (Signed)
CSW continuing to follow.   Per MD, pt medically ready for discharge today.   CSW followed up with pt wife, Dylan Moreno via telephone. CSW updated pt wife that pt medically ready for discharge today. CSW provided pt wife with SNF bed offers. Pt wife chooses bed at [facility]. CSW notified [facility] of acceptance of bed offer and confirmed with facility that facility has private room availability today per pt wife preference.   CSW updated pt wife via telephone. CSW also spoke with pt family friend, Dylan Moreno who plans to be at hospital to support pt during transition to SNF this afternoon.   CSW to facilitate pt discharge needs today.  Alison Murray, MSW, [place] Work [phone]

## 2014-06-03 NOTE — Progress Notes (Signed)
Noted plan for possible discharge soon. I will make him an appointment to see me back in 2 weeks on 06/17/2014 for further follow-up. The patient continues to be in a state of delirium and he does not appear to recognize who I am. Continue supportive care for now.

## 2014-06-03 NOTE — Progress Notes (Signed)
Oncology Nurse Navigator Documentation  Oncology Nurse Navigator Flowsheets 06/03/2014  Navigator Encounter Type Other  Patient Visit Type Inpatient  Visited with patient prior to DC to [facility].  He was disoriented to place and time, very confused, e.g thought he had just been diagnosed with cancer, couldn't find his glasses unaware he was wearing them, tried placing paperback book in eyeglass case.  I will continue to follow-up with his family.  Treatment Phase -  Barriers/Navigation Needs -  Education -  Interventions -  Coordination of Care -  Time Spent with Patient [place], RN, BSN, [place] at [place] [phone]

## 2014-06-03 NOTE — Progress Notes (Signed)
Report given to Herbie Baltimore, Therapist, sports at [facility].

## 2014-06-04 ENCOUNTER — Telehealth: Payer: Self-pay | Admitting: Hematology and Oncology

## 2014-06-04 ENCOUNTER — Telehealth: Payer: Self-pay | Admitting: *Deleted

## 2014-06-04 ENCOUNTER — Emergency Department (HOSPITAL_COMMUNITY)
Admission: EM | Admit: 2014-06-04 | Discharge: 2014-06-05 | Disposition: A | Discharge status: Home / Self Care | Payer: Medicare Other | Attending: Emergency Medicine | Admitting: Emergency Medicine

## 2014-06-04 ENCOUNTER — Encounter: Payer: Self-pay | Admitting: Adult Health

## 2014-06-04 ENCOUNTER — Emergency Department (HOSPITAL_COMMUNITY): Payer: Medicare Other

## 2014-06-04 ENCOUNTER — Non-Acute Institutional Stay (SKILLED_NURSING_FACILITY): Payer: Medicare Other | Admitting: Adult Health

## 2014-06-04 ENCOUNTER — Encounter (HOSPITAL_COMMUNITY): Payer: Self-pay

## 2014-06-04 ENCOUNTER — Ambulatory Visit: Payer: Self-pay

## 2014-06-04 DIAGNOSIS — R410 Disorientation, unspecified: Secondary | ICD-10-CM | POA: Insufficient documentation

## 2014-06-04 DIAGNOSIS — I6502 Occlusion and stenosis of left vertebral artery: Secondary | ICD-10-CM | POA: Diagnosis not present

## 2014-06-04 DIAGNOSIS — Z87442 Personal history of urinary calculi: Secondary | ICD-10-CM | POA: Insufficient documentation

## 2014-06-04 DIAGNOSIS — M1990 Unspecified osteoarthritis, unspecified site: Secondary | ICD-10-CM | POA: Diagnosis not present

## 2014-06-04 DIAGNOSIS — Z8673 Personal history of transient ischemic attack (TIA), and cerebral infarction without residual deficits: Secondary | ICD-10-CM | POA: Insufficient documentation

## 2014-06-04 DIAGNOSIS — Z87891 Personal history of nicotine dependence: Secondary | ICD-10-CM | POA: Insufficient documentation

## 2014-06-04 DIAGNOSIS — H538 Other visual disturbances: Secondary | ICD-10-CM

## 2014-06-04 DIAGNOSIS — Z85818 Personal history of malignant neoplasm of other sites of lip, oral cavity, and pharynx: Secondary | ICD-10-CM | POA: Insufficient documentation

## 2014-06-04 DIAGNOSIS — E780 Pure hypercholesterolemia: Secondary | ICD-10-CM | POA: Diagnosis not present

## 2014-06-04 DIAGNOSIS — Z8719 Personal history of other diseases of the digestive system: Secondary | ICD-10-CM | POA: Insufficient documentation

## 2014-06-04 DIAGNOSIS — Z79899 Other long term (current) drug therapy: Secondary | ICD-10-CM | POA: Insufficient documentation

## 2014-06-04 DIAGNOSIS — H5704 Mydriasis: Secondary | ICD-10-CM | POA: Diagnosis not present

## 2014-06-04 DIAGNOSIS — Z87448 Personal history of other diseases of urinary system: Secondary | ICD-10-CM | POA: Insufficient documentation

## 2014-06-04 DIAGNOSIS — E785 Hyperlipidemia, unspecified: Secondary | ICD-10-CM | POA: Insufficient documentation

## 2014-06-04 DIAGNOSIS — I6523 Occlusion and stenosis of bilateral carotid arteries: Secondary | ICD-10-CM | POA: Diagnosis not present

## 2014-06-04 DIAGNOSIS — H5703 Miosis: Secondary | ICD-10-CM | POA: Diagnosis not present

## 2014-06-04 DIAGNOSIS — Z8659 Personal history of other mental and behavioral disorders: Secondary | ICD-10-CM | POA: Diagnosis not present

## 2014-06-04 DIAGNOSIS — R40244 Other coma, without documented Glasgow coma scale score, or with partial score reported: Secondary | ICD-10-CM | POA: Diagnosis not present

## 2014-06-04 DIAGNOSIS — I672 Cerebral atherosclerosis: Secondary | ICD-10-CM | POA: Diagnosis not present

## 2014-06-04 DIAGNOSIS — I10 Essential (primary) hypertension: Secondary | ICD-10-CM | POA: Diagnosis not present

## 2014-06-04 DIAGNOSIS — Z7982 Long term (current) use of aspirin: Secondary | ICD-10-CM | POA: Diagnosis not present

## 2014-06-04 DIAGNOSIS — R440 Auditory hallucinations: Secondary | ICD-10-CM | POA: Diagnosis present

## 2014-06-04 LAB — COMPREHENSIVE METABOLIC PANEL
ALT: 93 U/L — ABNORMAL HIGH (ref 17–63)
AST: 61 U/L — ABNORMAL HIGH (ref 15–41)
Albumin: 3 g/dL — ABNORMAL LOW (ref 3.5–5.0)
Alkaline Phosphatase: 109 U/L (ref 38–126)
Anion gap: 10 (ref 5–15)
BUN: 33 mg/dL — ABNORMAL HIGH (ref 6–20)
CO2: 26 mmol/L (ref 22–32)
Calcium: 9.5 mg/dL (ref 8.9–10.3)
Chloride: 96 mmol/L — ABNORMAL LOW (ref 101–111)
Creatinine, Ser: 1.01 mg/dL (ref 0.61–1.24)
GFR calc Af Amer: >60 mL/min (ref >60)
GFR calc non Af Amer: >60 mL/min (ref >60)
Glucose, Bld: 111 mg/dL — ABNORMAL HIGH (ref 65–99)
Potassium: 4.6 mmol/L (ref 3.5–5.1)
Sodium: 132 mmol/L — ABNORMAL LOW (ref 135–145)
Total Bilirubin: 0.4 mg/dL (ref 0.3–1.2)
Total Protein: 7 g/dL (ref 6.5–8.1)

## 2014-06-04 LAB — URINALYSIS, ROUTINE W REFLEX MICROSCOPIC
Bilirubin Urine: NEGATIVE
Glucose, UA: NEGATIVE mg/dL
Hgb urine dipstick: NEGATIVE
Ketones, ur: NEGATIVE mg/dL
Nitrite: NEGATIVE
Protein, ur: NEGATIVE mg/dL
Specific Gravity, Urine: 1.019 (ref 1.005–1.030)
Urobilinogen, UA: 1 mg/dL (ref 0.0–1.0)
pH: 5.5 (ref 5.0–8.0)

## 2014-06-04 LAB — CBC WITH DIFFERENTIAL/PLATELET
Basophils Absolute: 0 10*3/uL (ref 0.0–0.1)
Basophils Relative: 0 % (ref 0–1)
Eosinophils Absolute: 0 10*3/uL (ref 0.0–0.7)
Eosinophils Relative: 0 % (ref 0–5)
HCT: 30.7 % — ABNORMAL LOW (ref 39.0–52.0)
Hemoglobin: 10.3 g/dL — ABNORMAL LOW (ref 13.0–17.0)
Lymphocytes Relative: 8 % — ABNORMAL LOW (ref 12–46)
Lymphs Abs: 0.8 10*3/uL (ref 0.7–4.0)
MCH: 28.6 pg (ref 26.0–34.0)
MCHC: 33.6 g/dL (ref 30.0–36.0)
MCV: 85.3 fL (ref 78.0–100.0)
Monocytes Absolute: 0.5 10*3/uL (ref 0.1–1.0)
Monocytes Relative: 5 % (ref 3–12)
Neutro Abs: 9 10*3/uL — ABNORMAL HIGH (ref 1.7–7.7)
Neutrophils Relative %: 87 % — ABNORMAL HIGH (ref 43–77)
Platelets: 481 10*3/uL — ABNORMAL HIGH (ref 150–400)
RBC: 3.6 MIL/uL — ABNORMAL LOW (ref 4.22–5.81)
RDW: 13.4 % (ref 11.5–15.5)
WBC: 10.3 10*3/uL (ref 4.0–10.5)

## 2014-06-04 LAB — URINE MICROSCOPIC-ADD ON

## 2014-06-04 MED ORDER — LORAZEPAM 2 MG/ML IJ SOLN
0.5000 mg | Freq: Once | INTRAMUSCULAR | Status: AC
  Administered 2014-06-04: 0.5 mg via INTRAVENOUS
  Filled 2014-06-04: qty 1

## 2014-06-04 NOTE — Progress Notes (Signed)
Patient ID: Dylan Moreno, male   DOB: 1940/06/21, 74 y.o.   MRN: [id number]  Dylan Moreno living [place]     No Known Allergies     Chief Complaint  Patient presents with  . Hospitalization Follow-up    HPI:  He was admitted to this facility. His son has noted his right pupil enlarged and not reactive. He is confused; knows year and not otherwise oriented. I have reviewed his medical record with his pupil normal in those notes. I have spoken with Dr. Eulas Post and is in agreement that he will need to return back to the hospital for further evaluation.    Past Medical History  Diagnosis Date  . Hyperlipemia   . Arthritis   . ADHD (attention deficit hyperactivity disorder)   . Wears glasses   . Hypercholesterolemia   . Peyronie's disease   . Kidney stones   . TIA (transient ischemic attack)   . Hypertension   . Anxiety   . GERD (gastroesophageal reflux disease)   . 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 TIA (transient ischemic attack) 12/29/12    left facial droop    Past Surgical History  Procedure Laterality Date  . Dupuytren contracture release  2012    left hand  . Tonsillectomy    . Shoulder arthroscopy w/ rotator cuff repair      left  . Colonoscopy    . Cystoscopy w/ ureteroscopy w/ lithotripsy  2011  . Hand surgery      right  . Dupuytren contracture release Right 10/30/2012    Procedure: DUPUYTREN CONTRACTURE RELEASE RIGHT PALM,RING AND SMALL FINGER;  Surgeon: Cammie Sickle., MD;  Location: [place];  Service: Orthopedics;  Laterality: Right;    VITAL SIGNS BP 122/80 mmHg  Pulse 74  Ht 6' (1.829 m)  Wt 173 lb (78.472 kg)  BMI 23.46 kg/m2   Outpatient Encounter Prescriptions as of 06/04/2014  Medication Sig  . Amino Acids-Protein Hydrolys (FEEDING SUPPLEMENT, PRO-STAT SUGAR FREE 64,) LIQD Place 30 mLs into feeding tube 3 (three) times daily.  Marland Kitchen amphetamine-dextroamphetamine (ADDERALL) 20 MG tablet Place 1  tablet (20 mg total) into feeding tube 2 (two) times daily.  Marland Kitchen antiseptic oral rinse (CPC / CETYLPYRIDINIUM CHLORIDE 0.05%) 0.05 % LIQD solution 7 mLs by Mouth Rinse route 2 times daily at 12 noon and 4 pm.  . aspirin 81 MG tablet Place 1 tablet (81 mg total) into feeding tube daily.  Marland Kitchen atorvastatin (LIPITOR) 20 MG tablet Place 1 tablet (20 mg total) into feeding tube daily at 6 PM.  . chlorhexidine (PERIDEX) 0.12 % solution 15 mLs by Mouth Rinse route 2 (two) times daily.  . clopidogrel (PLAVIX) 75 MG tablet Place 1 tablet (75 mg total) into feeding tube daily.  Marland Kitchen emollient (BIAFINE) cream Apply topically as needed (radiation burn).   Marland Kitchen levothyroxine (SYNTHROID, LEVOTHROID) 75 MCG tablet Place 1 tablet (75 mcg total) into feeding tube daily before breakfast.  . Morphine Sulfate (MORPHINE CONCENTRATE) 10 MG/0.5ML SOLN concentrated solution Place 0.5 mLs (10 mg total) into feeding tube every 2 (two) hours as needed for moderate pain, severe pain or shortness of breath.  . Nutritional Supplements (FEEDING SUPPLEMENT, GLUCERNA 1.2 CAL,) LIQD Place 474 mLs into feeding tube 3 (three) times daily. Use 153mL free water before and after each tube feed  . ondansetron (ZOFRAN) 8 MG tablet Place 1 tablet (8 mg total) into feeding tube every 8 (eight) hours  as needed for nausea or vomiting.  . thiamine (VITAMIN B-1) 100 MG tablet Place 1 tablet (100 mg total) into feeding tube daily. Take 1 tablet TID for one week, then 1 tablet daily thereafter   No facility-administered encounter medications on file as of 06/04/2014.     SIGNIFICANT DIAGNOSTIC EXAMS  05-25-14: Stable ventricular prominence.  No acute abnormality is noted.  05-27-14: ct of neck soft tissue: Cervical adenopathy has improved since 03/18/2014 related to cancer treatment. Large necrotic node on the left is smaller. Additional posterior lymph node on the left is smaller as well. Small right level 2 lymph node slightly smaller. No new  adenopathy Tongue base mass difficult to see on the current study.   05-27-14: ct of head: Ventricular enlargement slightly more prominent. Findings suggest communicating hydrocephalus  Negative for metastatic disease or acute infarct.  05-28-14: chest x-ray: Suggestion of minimal vascular congestion with mild linear atelectasis over the right midlung and left base.   06-02-14: swallow study: Ice chips PRN after oral care;Free water protocol after oral care;NPO    LABS REVIEWED:   05-24-14: wbc 1.9; hgb 10.1; hct 29.7; mcv 86.6; plt 368; glucose 122; bun 25.9; creat 0.9; k+4.8; na++136; liver nromal albumin 2.9 05-26-14: wbc 2.9; hgb 10.0; hct 29.2; mcv 86.9; plt 400; glucose 101; bun 26; creat 1.02; k+4.5; na++134; liver normal albumin 3.2 05-27-14: tsh 2.987; vit b1: 157.3; folate 37.0; vit b12: 758; ammonia 20 06-01-14: glucose 130; bun 30; creat 0.69; k+4.4; na++131      Review of Systems  Unable to perform ROS    Physical Exam  Constitutional: No distress.  thin  Eyes:  Right pupil 4 mm non-reactive Left pupil 2 mm reactive    Neck: Neck supple. No JVD present.  Cardiovascular: Normal rate, regular rhythm and intact distal pulses.   Respiratory: Effort normal and breath sounds normal. No respiratory distress.  GI: Soft. Bowel sounds are normal. He exhibits no distension. There is no tenderness.  Has peg tube  Musculoskeletal: He exhibits no edema.  Is able to move all extremities   Neurological: He is alert.  Skin: Skin is warm and dry. He is not diaphoretic.       ASSESSMENT/ PLAN:  Will send him to the ED for further evaluation due to his uneven pupils.   Time spent with patient 45 minutes.    Ok Edwards NP [facility]  Contact [phone] Monday through Friday 8am- 5pm  After hours call [phone]

## 2014-06-04 NOTE — ED Notes (Signed)
Tech went to pt's room to remove pt IV, but pt had already removed it. Informed RN.

## 2014-06-04 NOTE — ED Notes (Signed)
Neurology at the bedside

## 2014-06-04 NOTE — ED Notes (Signed)
Placed pt on bed alarm, per Hannah-RN.

## 2014-06-04 NOTE — ED Notes (Signed)
Per EMS, Patient has been going through radiation for cancer. Patient left Dylan Moreno yesterday and moved to [facility] for rehabilitation. Facility noticed that patient started to have some confusion right eye was dilated about 0815. Patient was normal when he went to bed last night. Vitals per EMS: 119 CBG, 110/76, 90 HR.

## 2014-06-04 NOTE — ED Notes (Signed)
Patient walked to CT.

## 2014-06-04 NOTE — ED Notes (Addendum)
Yvetta Coder, NT came out of patient's room stating, "Patient was on the floor." Walked in to patient's room and patient was sitting straight up on his bottom and stated he had moved to the end of the bed and slid off the end. Patient denied any pain or any injury. Patient denied hitting head. Patient was at baseline Neurological status. Patient was helped to his feet and was placed back in the bed. MD was made aware immediately.

## 2014-06-04 NOTE — Telephone Encounter (Signed)
Oncology Nurse Navigator Documentation  Oncology Nurse Navigator Flowsheets 06/01/2014 06/03/2014 06/04/2014  Navigator Encounter Type Other Other Telephone  Received call from patient's business partner, [name], who informed me that patient is enroute to [facility] ER.  She stated when patient's son visited him at [facility] this morning his pupil dilation was unequal.  Nursing supervisor notified, she indicated he needed to go to ER.  Son requested [facility].  I will follow-up.  Patient Visit Type Inpatient Inpatient -  Treatment Phase - - -  Barriers/Navigation Needs - - -  Education - - -  Interventions - - -  Coordination of Care - - -  Time Spent with Patient 45 30 [place], Therapist, sports, Copywriter, advertising, [place] at [place] [phone]

## 2014-06-04 NOTE — ED Notes (Signed)
Patient returned from CT

## 2014-06-04 NOTE — ED Provider Notes (Signed)
CSN: [id number]     Arrival date & time 06/04/14  1234 History   First MD Initiated Contact with Patient 06/04/14 1236     Chief Complaint  Patient presents with  . Neurologic Problem     (Consider location/radiation/quality/duration/timing/severity/associated sxs/prior Treatment) Patient is a 74 y.o. male presenting with neurologic complaint. The history is provided by the patient and a relative.  Neurologic Problem Pertinent negatives include no abdominal pain, chills, coughing, fatigue, fever, headaches, myalgias, nausea, neck pain, vomiting or weakness.    Pt is a 74yo male with hx of TIA, hydrocephalus, stage IV throat cancer- just completed chemo and radiation 2 weeks ago, was sent to [facility] for Rehab after being discharged from [facility] yesterday for confusion.  Majority of hx provided by son who is a Airline pilot.  Son states pt has had confusion and auditory/visual hallucinations over the last few weeks, thought to be due to pt's pain medications and then thought to be due to withdrawal from his fentanyl which was discontinued 5/28.  Pt son, pt is less confused today, however, he noticed pt has a dilated Right pupil which was not dilated yesterday.  This was noticed around 0815.  Per notes from PCP, pt had non-reactive and was confused. No fever, n/v/d. No urinary symptoms.   Past Medical History  Diagnosis Date  . Hyperlipemia   . Arthritis   . ADHD (attention deficit hyperactivity disorder)   . Wears glasses   . Hypercholesterolemia   . Peyronie's disease   . Kidney stones   . TIA (transient ischemic attack)   . Hypertension   . Anxiety   . GERD (gastroesophageal reflux disease)   . 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 TIA (transient ischemic attack) 12/29/12    left facial droop   Past Surgical History  Procedure Laterality Date  . Dupuytren contracture release  2012    left hand  . Tonsillectomy    . Shoulder arthroscopy w/ rotator cuff  repair      left  . Colonoscopy    . Cystoscopy w/ ureteroscopy w/ lithotripsy  2011  . Hand surgery      right  . Dupuytren contracture release Right 10/30/2012    Procedure: DUPUYTREN CONTRACTURE RELEASE RIGHT PALM,RING AND SMALL FINGER;  Surgeon: Cammie Sickle., MD;  Location: [place];  Service: Orthopedics;  Laterality: Right;   Family History  Problem Relation Age of Onset  . Heart attack Mother     Deceased, 63  . Hypothyroidism Father     Deceased, 71  . Melanoma Brother     Living, 58  . Healthy Son   . Cancer Paternal Uncle     ?lung ca   History  Substance Use Topics  . Smoking status: Former Smoker -- 1.00 packs/day for 40 years    Types: Cigarettes, Cigars    Quit date: 03/02/2014  . Smokeless tobacco: Never Used     Comment: smokes occasionally  . Alcohol Use: 0.0 oz/week    0 Standard drinks or equivalent per week     Comment: every night (2-3oz of scotch or gin martini)    Review of Systems  Constitutional: Negative for fever, chills and fatigue.  Respiratory: Negative for cough and shortness of breath.   Gastrointestinal: Negative for nausea, vomiting, abdominal pain and diarrhea.  Musculoskeletal: Negative for myalgias, neck pain and neck stiffness.  Neurological: Negative for dizziness, syncope, weakness, light-headedness and headaches.  Psychiatric/Behavioral: Positive for confusion.  All other systems reviewed and are negative.     Allergies  Review of patient's allergies indicates no known allergies.  Home Medications   Prior to Admission medications   Medication Sig Start Date End Date Taking? Authorizing Provider  Amino Acids-Protein Hydrolys (FEEDING SUPPLEMENT, PRO-STAT SUGAR FREE 64,) LIQD Place 30 mLs into feeding tube 3 (three) times daily. 06/03/14  Yes Janece Canterbury, MD  amphetamine-dextroamphetamine (ADDERALL) 20 MG tablet Place 1 tablet (20 mg total) into feeding tube 2 (two) times daily. 06/03/14  Yes Janece Canterbury, MD  antiseptic oral rinse (CPC / CETYLPYRIDINIUM CHLORIDE 0.05%) 0.05 % LIQD solution 7 mLs by Mouth Rinse route 2 times daily at 12 noon and 4 pm. 06/03/14  Yes Janece Canterbury, MD  aspirin 81 MG tablet Place 1 tablet (81 mg total) into feeding tube daily. 06/03/14  Yes Janece Canterbury, MD  atorvastatin (LIPITOR) 20 MG tablet Place 1 tablet (20 mg total) into feeding tube daily at 6 PM. 06/03/14  Yes Janece Canterbury, MD  chlorhexidine (PERIDEX) 0.12 % solution 15 mLs by Mouth Rinse route 2 (two) times daily. 06/03/14  Yes Janece Canterbury, MD  clopidogrel (PLAVIX) 75 MG tablet Place 1 tablet (75 mg total) into feeding tube daily. 06/03/14  Yes Janece Canterbury, MD  levothyroxine (SYNTHROID, LEVOTHROID) 75 MCG tablet Place 1 tablet (75 mcg total) into feeding tube daily before breakfast. 06/03/14  Yes Janece Canterbury, MD  Morphine Sulfate (MORPHINE CONCENTRATE) 10 MG/0.5ML SOLN concentrated solution Place 0.5 mLs (10 mg total) into feeding tube every 2 (two) hours as needed for moderate pain, severe pain or shortness of breath. 06/03/14  Yes Janece Canterbury, MD  Nutritional Supplements (FEEDING SUPPLEMENT, GLUCERNA 1.2 CAL,) LIQD Place 474 mLs into feeding tube 3 (three) times daily. Use 168mL free water before and after each tube feed 06/03/14  Yes Janece Canterbury, MD  ondansetron (ZOFRAN) 8 MG tablet Place 1 tablet (8 mg total) into feeding tube every 8 (eight) hours as needed for nausea or vomiting. 06/03/14  Yes Janece Canterbury, MD  thiamine (VITAMIN B-1) 100 MG tablet Place 1 tablet (100 mg total) into feeding tube daily. Take 1 tablet TID for one week, then 1 tablet daily thereafter 06/03/14  Yes Janece Canterbury, MD   BP 132/63 mmHg  Pulse 84  Temp(Src) 97.9 F (36.6 C) (Oral)  Resp 21  SpO2 97% Physical Exam  Constitutional: He appears well-developed and well-nourished.  HENT:  Head: Normocephalic and atraumatic.  Eyes: Conjunctivae, EOM and lids are normal. Right eye exhibits no discharge. Left eye  exhibits no discharge. No scleral icterus. Right pupil is round and reactive. Left pupil is round and reactive. Pupils are unequal.  Neck: Normal range of motion. Neck supple.  Cardiovascular: Normal rate, regular rhythm and normal heart sounds.   Pulmonary/Chest: Effort normal and breath sounds normal. No respiratory distress. He has no wheezes. He has no rales. He exhibits no tenderness.  Abdominal: Soft. Bowel sounds are normal. He exhibits no distension and no mass. There is no tenderness. There is no rebound and no guarding.  Musculoskeletal: Normal range of motion.  Neurological: He is alert. No cranial nerve deficit. Coordination normal.  Alert to person and place. No facial droop, smile is symmetric.  Speech is clear, pt does need redirecting in conversation, has unrelated statements. Able to follow 2 step commands. FROM arms and legs.   Skin: Skin is warm and dry.  Nursing note and vitals reviewed.   ED Course  Procedures (including critical care time) Labs Review Labs Reviewed  CBC WITH DIFFERENTIAL/PLATELET - Abnormal; Notable for the following:    RBC 3.60 (*)    Hemoglobin 10.3 (*)    HCT 30.7 (*)    Platelets 481 (*)    Neutrophils Relative % 87 (*)    Lymphocytes Relative 8 (*)    Neutro Abs 9.0 (*)    All other components within normal limits  COMPREHENSIVE METABOLIC PANEL - Abnormal; Notable for the following:    Sodium 132 (*)    Chloride 96 (*)    Glucose, Bld 111 (*)    BUN 33 (*)    Albumin 3.0 (*)    AST 61 (*)    ALT 93 (*)    All other components within normal limits  URINALYSIS, ROUTINE W REFLEX MICROSCOPIC (NOT AT [facility]) - Abnormal; Notable for the following:    Leukocytes, UA SMALL (*)    All other components within normal limits  URINE CULTURE  URINE MICROSCOPIC-ADD ON    Imaging Review Ct Head Wo Contrast  06/04/2014   CLINICAL DATA:  Confusion, dilated right eye.  EXAM: CT HEAD WITHOUT CONTRAST  TECHNIQUE: Contiguous axial images were obtained  from the base of the skull through the vertex without intravenous contrast.  COMPARISON:  CT scan of May 25, 2014.  FINDINGS: Bony calvarium appears intact. Minimal diffuse cortical atrophy is noted. Stable ventricular prominence is noted compared to prior exam. Minimal chronic ischemic white matter disease is noted. No mass effect or midline shift is noted. There is no evidence of mass lesion, hemorrhage or acute infarction.  IMPRESSION: Minimal diffuse cortical atrophy. Minimal chronic ischemic white matter disease. Stable prominence of the lateral and third ventricles is noted. No significant change compared to prior exam.   Electronically Signed   By: Marijo Conception, M.D.   On: 06/04/2014 14:37     EKG Interpretation   Date/Time:  Friday June 04 2014 12:39:29 EDT Ventricular Rate:  86 PR Interval:  136 QRS Duration: 94 QT Interval:  387 QTC Calculation: 463 R Axis:   -30 Text Interpretation:  Sinus rhythm Left axis deviation Abnormal R-wave  progression, early transition Baseline wander in lead(s) V2 No significant  change since last tracing Confirmed by Maryan Rued  MD, Loree Fee ([id number]) on  06/04/2014 1:38:06 PM      MDM   Final diagnoses:  Dilated right pupil due to trauma  Dilated right pupil due to trauma   Pt is a 74yo male presenting to ED from PCP for further evaluation of dilated Right pupil that was non-reactive as well as confusion. Per son, confusion has improved. Pt is alert to person, place, and time. Pupil is dilated compared to Left. Both pupils are reactive. Pt otherwise well, no other significant findings.  Labs: unremarkable. CT head: unremarkable. Consulted with neurology who also examined pt.  Recommended getting CTA head and neck.   4:32 PM Pt signed out to Dr. Wyvonnia Dusky at shift change. Plan is to review CTA. If unremarkable, pt may be discharged home to f/u with ophthalmology.     Noland Fordyce, PA-C 06/04/14 [place], MD 06/04/14 2135

## 2014-06-04 NOTE — ED Notes (Addendum)
MD at the bedside  

## 2014-06-04 NOTE — Telephone Encounter (Signed)
lvm for pt regarding to June appt....mailed pt appt sched and letter

## 2014-06-04 NOTE — ED Provider Notes (Signed)
I assumed care from [place], [place] at 1600. Agree with HPI and exam. Please her note for detailed history. Briefly, Pt is a 74yo male with hx of TIA, hydrocephalus, stage IV throat cancer- just completed chemo and radiation 2 weeks ago, was sent to [facility] for Rehab after being discharged from [facility] yesterday for confusion. Majority of hx provided by son who is a Airline pilot. Son states pt has had confusion and auditory/visual hallucinations over the last few weeks, thought to be due to pt's pain medications and then thought to be due to withdrawal from his fentanyl which was discontinued 5/28. Pt son, pt is less confused today, however, he noticed pt has a dilated Right pupil which was not dilated yesterday.   CT head negative. Neurology consulted and recommended CTA head and neck. If negative, plan for discharge with ophthalmology follow up.  CTA negative for aneurysm.   On reassessment, pt mental status was unchanged. No acute incident occurred during my care.  Patient for discharge and given strict return precautions. He should follow-up with ophthalmology for further evaluation of the right dilated pupil.  Patient seen in conjunction with Dr. Wyvonnia Moreno.  Dylan Parr, MD Resident   Dylan Lank, MD 06/04/14 [place], MD 06/05/14 0157

## 2014-06-04 NOTE — Discharge Instructions (Signed)
Confusion Confusion is the inability to think with your usual speed or clarity. Confusion may come on quickly or slowly over time. How quickly the confusion comes on depends on the cause. Confusion can be due to any number of causes. CAUSES   Concussion, head injury, or head trauma.  Seizures.  Stroke.  Fever.  Brain tumor.  Age related decreased brain function (dementia).  Heightened emotional states like rage or terror.  Mental illness in which the person loses the ability to determine what is real and what is not (hallucinations).  Infections such as a urinary tract infection (UTI).  Toxic effects from alcohol, drugs, or prescription medicines.  Dehydration and an imbalance of salts in the body (electrolytes).  Lack of sleep.  Low blood sugar (diabetes).  Low levels of oxygen from conditions such as chronic lung disorders.  Drug interactions or other medicine side effects.  Nutritional deficiencies, especially niacin, thiamine, vitamin C, or vitamin B.  Sudden drop in body temperature (hypothermia).  Change in routine, such as when traveling or hospitalized. SIGNS AND SYMPTOMS  People often describe their thinking as cloudy or unclear when they are confused. Confusion can also include feeling disoriented. That means you are unaware of where or who you are. You may also not know what the date or time is. If confused, you may also have difficulty paying attention, remembering, and making decisions. Some people also act aggressively when they are confused.  DIAGNOSIS  The medical evaluation of confusion may include:  Blood and urine tests.  X-rays.  Brain and nervous system tests.  Analyzing your brain waves (electroencephalogram or EEG).  Magnetic resonance imaging (MRI) of your head.  Computed tomography (CT) scan of your head.  Mental status tests in which your health care provider may ask many questions. Some of these questions may seem silly or strange,  but they are a very important test to help diagnose and treat confusion. TREATMENT  An admission to the hospital may not be needed, but a person with confusion should not be left alone. Stay with a family member or friend until the confusion clears. Avoid alcohol, pain relievers, or sedative drugs until you have fully recovered. Do not drive until directed by your health care provider. HOME CARE INSTRUCTIONS  What family and friends can do:  To find out if someone is confused, ask the person to state his or her name, age, and the date. If the person is unsure or answers incorrectly, he or she is confused.  Always introduce yourself, no matter how well the person knows you.  Often remind the person of his or her location.  Place a calendar and clock near the confused person.  Help the person with his or her medicines. You may want to use a pill box, an alarm as a reminder, or give the person each dose as prescribed.  Talk about current events and plans for the day.  Try to keep the environment calm, quiet, and peaceful.  Make sure the person keeps follow-up visits with his or her health care provider. PREVENTION  Ways to prevent confusion:  Avoid alcohol.  Eat a balanced diet.  Get enough sleep.  Take medicine only as directed by your health care provider.  Do not become isolated. Spend time with other people and make plans for your days.  Keep careful watch on your blood sugar levels if you are diabetic. SEEK IMMEDIATE MEDICAL CARE IF:   You develop severe headaches, repeated vomiting, seizures, blackouts, or   slurred speech.  There is increasing confusion, weakness, numbness, restlessness, or personality changes.  You develop a loss of balance, have marked dizziness, feel uncoordinated, or fall.  You have delusions, hallucinations, or develop severe anxiety.  Your family members think you need to be rechecked. Document Released: 01/26/2004 Document Revised: 05/04/2013  Document Reviewed: 01/23/2013 Exit[employer] Patient Information 2015 [employer]. This information is not intended to replace advice given to you by your health care provider. Make sure you discuss any questions you have with your health care provider.  

## 2014-06-04 NOTE — Consult Note (Signed)
NEURO HOSPITALIST CONSULT NOTE    Reason for Consult: dilated right pupiol  HPI:                                                                                                                                          Dylan Moreno is an 74 y.o. 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 He has been residing at [facility] due to delirium after his chemo and radiation. family states both his pupils were normal size yesterday but this AM family member noted his right pupil was larger.  Patient does not have a HA, blurred vision, photophobia, lack of vision or lack of color vision. He denies any eye pain.  CT head was obtained in ED and showed no acute abnormalities. Neurology was consulted to evaluate.   Past Medical History  Diagnosis Date  . Hyperlipemia   . Arthritis   . ADHD (attention deficit hyperactivity disorder)   . Wears glasses   . Hypercholesterolemia   . Peyronie's disease   . Kidney stones   . TIA (transient ischemic attack)   . Hypertension   . Anxiety   . GERD (gastroesophageal reflux disease)   . 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 TIA (transient ischemic attack) 12/29/12    left facial droop    Past Surgical History  Procedure Laterality Date  . Dupuytren contracture release  2012    left hand  . Tonsillectomy    . Shoulder arthroscopy w/ rotator cuff repair      left  . Colonoscopy    . Cystoscopy w/ ureteroscopy w/ lithotripsy  2011  . Hand surgery      right  . Dupuytren contracture release Right 10/30/2012    Procedure: DUPUYTREN CONTRACTURE RELEASE RIGHT PALM,RING AND SMALL FINGER;  Surgeon: Cammie Sickle., MD;  Location: [facility];  Service: Orthopedics;  Laterality: Right;    Family History  Problem Relation Age of Onset  . Heart attack Mother     Deceased, 42  . Hypothyroidism Father     Deceased, 12  . Melanoma Brother      Living, 42  . Healthy Son   . Cancer Paternal Uncle     ?lung ca     Social History:  reports that he quit smoking about 3 months ago. His smoking use included Cigarettes and Cigars. He has a 40 pack-year smoking history. He has never used smokeless tobacco. He reports that he drinks alcohol. He reports that he does not use illicit drugs.  No Known Allergies  MEDICATIONS:  No current facility-administered medications for this encounter.   Current Outpatient Prescriptions  Medication Sig Dispense Refill  . Amino Acids-Protein Hydrolys (FEEDING SUPPLEMENT, PRO-STAT SUGAR FREE 64,) LIQD Place 30 mLs into feeding tube 3 (three) times daily. 900 mL 0  . amphetamine-dextroamphetamine (ADDERALL) 20 MG tablet Place 1 tablet (20 mg total) into feeding tube 2 (two) times daily. 60 tablet 0  . antiseptic oral rinse (CPC / CETYLPYRIDINIUM CHLORIDE 0.05%) 0.05 % LIQD solution 7 mLs by Mouth Rinse route 2 times daily at 12 noon and 4 pm. 946 mL 0  . aspirin 81 MG tablet Place 1 tablet (81 mg total) into feeding tube daily. 30 tablet 0  . atorvastatin (LIPITOR) 20 MG tablet Place 1 tablet (20 mg total) into feeding tube daily at 6 PM. 30 tablet 0  . chlorhexidine (PERIDEX) 0.12 % solution 15 mLs by Mouth Rinse route 2 (two) times daily. 120 mL 0  . clopidogrel (PLAVIX) 75 MG tablet Place 1 tablet (75 mg total) into feeding tube daily. 30 tablet 0  . levothyroxine (SYNTHROID, LEVOTHROID) 75 MCG tablet Place 1 tablet (75 mcg total) into feeding tube daily before breakfast. 30 tablet 0  . Morphine Sulfate (MORPHINE CONCENTRATE) 10 MG/0.5ML SOLN concentrated solution Place 0.5 mLs (10 mg total) into feeding tube every 2 (two) hours as needed for moderate pain, severe pain or shortness of breath. 30 mL 0  . Nutritional Supplements (FEEDING SUPPLEMENT, GLUCERNA 1.2 CAL,) LIQD Place 474 mLs into  feeding tube 3 (three) times daily. Use 154mL free water before and after each tube feed 1500 mL   . ondansetron (ZOFRAN) 8 MG tablet Place 1 tablet (8 mg total) into feeding tube every 8 (eight) hours as needed for nausea or vomiting. 30 tablet 1  . thiamine (VITAMIN B-1) 100 MG tablet Place 1 tablet (100 mg total) into feeding tube daily. Take 1 tablet TID for one week, then 1 tablet daily thereafter 30 tablet 0      ROS:                                                                                                                                       History obtained from the patient  General ROS: negative for - chills, fatigue, fever, night sweats, weight gain or weight loss Psychological ROS: negative for - behavioral disorder, hallucinations, memory difficulties, mood swings or suicidal ideation Ophthalmic ROS: negative for - blurry vision, double vision, eye pain or loss of vision ENT ROS: negative for - epistaxis, nasal discharge, oral lesions, sore throat, tinnitus or vertigo Allergy and Immunology ROS: negative for - hives or itchy/watery eyes Hematological and Lymphatic ROS: negative for - bleeding problems, bruising or swollen lymph nodes Endocrine ROS: negative for - galactorrhea, hair pattern changes, polydipsia/polyuria or temperature intolerance Respiratory ROS: negative for - cough, hemoptysis, shortness of breath or wheezing Cardiovascular ROS: negative for - chest pain, dyspnea  on exertion, edema or irregular heartbeat Gastrointestinal ROS: negative for - abdominal pain, diarrhea, hematemesis, nausea/vomiting or stool incontinence Genito-Urinary ROS: negative for - dysuria, hematuria, incontinence or urinary frequency/urgency Musculoskeletal ROS: negative for - joint swelling or muscular weakness Neurological ROS: as noted in HPI Dermatological ROS: negative for rash and skin lesion changes   Blood pressure 132/63, pulse 84, temperature 97.9 F (36.6 C), temperature  source Oral, resp. rate 21, SpO2 97 %.   Neurologic Examination:                                                                                                      HEENT-  Normocephalic, no lesions, without obvious abnormality.  Normal external eye and conjunctiva.  Normal TM's bilaterally.  Normal auditory canals and external ears. Normal external nose, mucus membranes and septum.  Normal pharynx. Cardiovascular- S1, S2 normal, pulses palpable throughout   Lungs- chest clear, no wheezing, rales, normal symmetric air entry Abdomen- normal findings: bowel sounds normal Extremities- no edema Lymph-no adenopathy palpable Musculoskeletal-no joint tenderness, deformity or swelling Skin-warm and dry, no hyperpigmentation, vitiligo, or suspicious lesions  Neurological Examination Mental Status: Alert, oriented, thought content appropriate.  Speech fluent without evidence of aphasia.  Able to follow 3 step commands without difficulty. Cranial Nerves: II: Discs flat bilaterally; Visual fields grossly normal, right pupil 6 mm and left pupil 3 mm, round, reactive to light and accommodation--no APD III,IV, VI: ptosis not present, extra-ocular motions intact bilaterally V,VII: smile symmetric, facial light touch sensation normal bilaterally VIII: hearing normal bilaterally IX,X: uvula rises symmetrically XI: bilateral shoulder shrug XII: midline tongue extension Motor: Right : Upper extremity   5/5    Left:     Upper extremity   5/5  Lower extremity   5/5     Lower extremity   5/5 Tone and bulk:normal tone throughout; no atrophy noted Sensory: Pinprick and light touch intact throughout, bilaterally Deep Tendon Reflexes: 1+ and symmetric throughout UE and KJ. No AJ Plantars: Right: downgoing   Left: downgoing Cerebellar: normal finger-to-nose, and normal heel-to-shin test Gait: not tested due to multiple leads      Lab Results: Basic Metabolic Panel:  Recent Labs Lab 05/31/14 0600  06/01/14 0438 06/02/14 0435 06/03/14 0545 06/04/14 1352  NA 129* 131* 133* 131* 132*  K 4.3 4.4 4.9 4.3 4.6  CL 94* 94* 96* 96* 96*  CO2 25 27 26 26 26   GLUCOSE 121* 130* 134* 114* 111*  BUN 23* 30* 35* 32* 33*  CREATININE 0.61 0.69 0.81 0.74 1.01  CALCIUM 9.2 9.4 9.5 9.2 9.5    Liver Function Tests:  Recent Labs Lab 05/30/14 0500 05/31/14 0600 06/04/14 1352  AST 40 49* 61*  ALT 47 66* 93*  ALKPHOS 96 106 109  BILITOT 0.6 0.4 0.4  PROT 6.6 6.7 7.0  ALBUMIN 2.8* 2.8* 3.0*   No results for input(s): LIPASE, AMYLASE in the last 168 hours. No results for input(s): AMMONIA in the last 168 hours.  CBC:  Recent Labs Lab 05/29/14 0623 05/31/14 0600 06/03/14 0545 06/04/14 1352  WBC  8.8 7.1 10.4 10.3  NEUTROABS  --   --   --  9.0*  HGB 9.7* 10.2* 10.0* 10.3*  HCT 27.9* 29.0* 30.3* 30.7*  MCV 83.3 84.1 86.3 85.3  PLT 360 397 454* 481*    Cardiac Enzymes: No results for input(s): CKTOTAL, CKMB, CKMBINDEX, TROPONINI in the last 168 hours.  Lipid Panel: No results for input(s): CHOL, TRIG, HDL, CHOLHDL, VLDL, LDLCALC in the last 168 hours.  CBG:  Recent Labs Lab 06/02/14 1648 06/02/14 2019 06/03/14 0020 06/03/14 0631 06/03/14 1158  GLUCAP 122* 144* 112* 102* 132*    Microbiology: Results for orders placed or performed during the hospital encounter of 05/24/14  TECHNOLOGIST REVIEW     Status: None   Collection Time: 05/24/14  3:41 PM  Result Value Ref Range Status   Technologist Review 2% Myelocytes  Final    Coagulation Studies: No results for input(s): LABPROT, INR in the last 72 hours.  Imaging: Ct Head Wo Contrast  06/04/2014   CLINICAL DATA:  Confusion, dilated right eye.  EXAM: CT HEAD WITHOUT CONTRAST  TECHNIQUE: Contiguous axial images were obtained from the base of the skull through the vertex without intravenous contrast.  COMPARISON:  CT scan of May 25, 2014.  FINDINGS: Bony calvarium appears intact. Minimal diffuse cortical atrophy is noted.  Stable ventricular prominence is noted compared to prior exam. Minimal chronic ischemic white matter disease is noted. No mass effect or midline shift is noted. There is no evidence of mass lesion, hemorrhage or acute infarction.  IMPRESSION: Minimal diffuse cortical atrophy. Minimal chronic ischemic white matter disease. Stable prominence of the lateral and third ventricles is noted. No significant change compared to prior exam.   Electronically Signed   By: Marijo Conception, M.D.   On: 06/04/2014 14:37       Assessment and plan per attending neurologist  Etta Quill PA-C [facility] [phone]  06/04/2014, 4:06 PM   Assessment/Plan: 74 YO male with new right dilated but reactive pupil. Exam is non-focal other than dilated pupil.  CT head shows no acute abnormalities. Etiology is unclear.    Recommend 1) CTA head and neck to evaluate for intercranial aneurysm 2) If CTA is negative would recommend Opthalmology cosult either in ED or out patient.    I personally participated in this patient's evaluation and management, including formulating above clinical impression and management recommendations.  Rush Farmer M.D. [facility] [phone]

## 2014-06-04 NOTE — ED Notes (Signed)
Pt's brief was changed and pt was placed in blue paper scrubs. All bedding was changed.

## 2014-06-04 NOTE — ED Notes (Addendum)
Son at the bedside. Alarm turned off. Son stated, "he has been falling a lot." Son made aware that he is not to leave the patient alone unless he lets the Nurse know. Son verbalizes consent.

## 2014-06-04 NOTE — ED Notes (Signed)
Paged [facility]   

## 2014-06-05 ENCOUNTER — Ambulatory Visit: Payer: Self-pay

## 2014-06-05 DIAGNOSIS — F99 Mental disorder, not otherwise specified: Secondary | ICD-10-CM | POA: Diagnosis not present

## 2014-06-05 LAB — URINE CULTURE: Colony Count: 15000

## 2014-06-05 NOTE — ED Notes (Signed)
PT FROM [facility] ON [address]

## 2014-06-07 ENCOUNTER — Telehealth: Payer: Self-pay | Admitting: *Deleted

## 2014-06-07 ENCOUNTER — Ambulatory Visit: Payer: Self-pay | Admitting: Neurology

## 2014-06-07 MED ORDER — IOHEXOL 350 MG/ML SOLN
100.0000 mL | Freq: Once | INTRAVENOUS | Status: AC | PRN
  Administered 2014-06-07: 100 mL via INTRAVENOUS

## 2014-06-07 NOTE — Telephone Encounter (Addendum)
Oncology Nurse Navigator Documentation  Oncology Nurse Navigator Flowsheets 06/07/2014  Navigator Encounter Type Telephone  Patient's wife called to express concern re: her husbands care at [facility]. I encouraged her to speak with [facility] Nursing Director and provide specifics.  She stated that she went to [facility] to check as an alternative location, was told Jim's application had been rejected x3 d/t concerns re: ETOH hx, mental confusion, level of agitation.  She asked about assistance with relocation, I indicated I would contact LCSW to inquire.  Patient Visit Type -  Treatment Phase -  Barriers/Navigation Needs -  Education -  Interventions -  Coordination of Care -  Time Spent with Patient 30

## 2014-06-08 ENCOUNTER — Non-Acute Institutional Stay (SKILLED_NURSING_FACILITY): Payer: Medicare Other | Admitting: Internal Medicine

## 2014-06-08 ENCOUNTER — Encounter: Payer: Self-pay | Admitting: Internal Medicine

## 2014-06-08 ENCOUNTER — Other Ambulatory Visit (HOSPITAL_COMMUNITY): Payer: Self-pay | Admitting: Internal Medicine

## 2014-06-08 ENCOUNTER — Ambulatory Visit: Payer: Self-pay | Admitting: Hematology and Oncology

## 2014-06-08 ENCOUNTER — Telehealth: Payer: Self-pay | Admitting: *Deleted

## 2014-06-08 DIAGNOSIS — E43 Unspecified severe protein-calorie malnutrition: Secondary | ICD-10-CM | POA: Diagnosis not present

## 2014-06-08 DIAGNOSIS — R1314 Dysphagia, pharyngoesophageal phase: Secondary | ICD-10-CM

## 2014-06-08 DIAGNOSIS — Z931 Gastrostomy status: Secondary | ICD-10-CM | POA: Diagnosis not present

## 2014-06-08 DIAGNOSIS — G9389 Other specified disorders of brain: Secondary | ICD-10-CM

## 2014-06-08 DIAGNOSIS — H5704 Mydriasis: Secondary | ICD-10-CM | POA: Diagnosis not present

## 2014-06-08 DIAGNOSIS — F1010 Alcohol abuse, uncomplicated: Secondary | ICD-10-CM | POA: Diagnosis not present

## 2014-06-08 DIAGNOSIS — C01 Malignant neoplasm of base of tongue: Secondary | ICD-10-CM | POA: Diagnosis not present

## 2014-06-08 HISTORY — DX: Other specified disorders of brain: G93.89

## 2014-06-08 HISTORY — DX: Alcohol abuse, uncomplicated: F10.10

## 2014-06-08 NOTE — Telephone Encounter (Addendum)
Following conversation with patient wife, called LCSW Alison Murray to address patient wife's concerns.  Unable to reach, will try again tomorrow.  Gayleen Orem, RN, BSN, [facility] [phone]

## 2014-06-08 NOTE — Progress Notes (Signed)
Patient ID: Dylan Moreno, male   DOB: Jun 13, 1940, 74 y.o.   MRN: [id number]    HISTORY AND PHYSICAL   DATE: 06/08/14  Location:  [place] [facility] Service: [facility] [phone])   Extended Emergency Contact Information Primary Emergency Contact: [place] Address: [address]          [place], [place] [address] Johnnette Litter of [place] Phone: [phone] Mobile Phone: [phone] Relation: Spouse Secondary Emergency Contact: Nani Ravens, [place] [address] Johnnette Litter of [place] Phone: [phone] Work Phone: [phone] Mobile Phone: [phone] Relation: Other  Advanced Directive information   FULL CODE   Chief Complaint  Patient presents with  . New Admit To SNF    HPI:  74 yo male seen today as a new admission into SNF following hospital stay for Etoh delirium due to alcohol abuse, dysphagia s/p Peg tube, ADHD, tongue CA s/p XRT, severe protein calorie malnutrition, hyponatremia, GERD, hypothyroidism. Hospital records reviewed. 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 NH3 level 20. Neuro saw pt and recommended o/p f/u. He was sent to the ED from SNF on June 3rd due to asymmetric pupils. CT head neg for acute process and CTA head/neck was neg for aneursym and pt was sent back to SNF.  Today, he has no c/o. He is tolerating tx. ST saw him today and recommend ice chips under strict supervision. He will need repeat modified barium swallow next week. He currently is NPO and receives meds/TF via Peg. He admits to feeling unsteady. Denies any hx chewing tobacco or dipping but admits to smoking cigs in past  ADHD - he takes adderall via Peg and is stable  Hyperlipidemia - stable on lipitor  Tongue mass/mouth pain - stable pain on mouth rinses ans roxanol  Thyroid - stable on levothyroxine  Etoh abuse - takes thiamine supplement. He has zofran to use prn  HTN/hx TIA - takes ASA and plavix. BP is diet controlled    Past Medical History  Diagnosis Date  . Hyperlipemia   . Arthritis   . ADHD (attention deficit hyperactivity disorder)   . Wears glasses   . Hypercholesterolemia   . Peyronie's disease   . Kidney stones   . TIA (transient ischemic attack)   . Hypertension   . Anxiety   . GERD (gastroesophageal reflux disease)   . 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 TIA (transient ischemic attack) 12/29/12    left facial droop    Past Surgical History  Procedure Laterality Date  . Dupuytren contracture release  2012    left hand  . Tonsillectomy    . Shoulder arthroscopy w/ rotator cuff repair      left  . Colonoscopy    . Cystoscopy w/ ureteroscopy w/ lithotripsy  2011  . Hand surgery      right  . Dupuytren contracture release Right 10/30/2012    Procedure: DUPUYTREN CONTRACTURE RELEASE RIGHT PALM,RING AND SMALL FINGER;  Surgeon: Cammie Sickle., MD;  Location: [facility];  Service: Orthopedics;  Laterality: Right;    Patient Care Team: Seward Carol, MD as PCP - General (Internal Medicine) Leota Sauers, RN as Oncology Nurse Navigator Eppie Gibson, MD as Attending Physician (Radiation Oncology) Heath Lark, MD as Consulting Physician (Hematology and Oncology) Karie Mainland, RD as Dietitian (Nutrition)  History   Social History  .  Marital Status: Married    Spouse Name: Vicente Males  . Number of Children: 1  . Years of Education: N/A   Occupational History  . Not on file.   Social History Main Topics  . Smoking status: Former Smoker -- 1.00 packs/day for 40 years    Types: Cigarettes, Cigars    Quit date: 03/02/2014  . Smokeless tobacco: Never Used     Comment: smokes occasionally  . Alcohol Use: 0.0 oz/week    0 Standard drinks or equivalent per week     Comment: every night (2-3oz of scotch or gin martini)  . Drug Use: No  . Sexual Activity: Not on file     Comment: mostly quit 2011-smokes occ   Other Topics Concern  . 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   He lives at home and has one grown son.   Son currently living with parents due to back surgery.   Highest level of education: 1.5 years of college     reports that he quit smoking about 3 months ago. His smoking use included Cigarettes and Cigars. He has a 40 pack-year smoking history. He has never used smokeless tobacco. He reports that he drinks alcohol. He reports that he does not use illicit drugs.  Family History  Problem Relation Age of Onset  . Heart attack Mother     Deceased, 44  . Hypothyroidism Father     Deceased, 1  . Melanoma Brother     Living, 49  . Healthy Son   . Cancer Paternal Uncle     ?lung ca   Family Status  Relation Status Death Age  . Mother Deceased 75    heart attack  . Father Deceased 51  . Brother Alive     melanoma     There is no immunization history on file for this patient.  No Known Allergies  Medications: Patient's Medications  New Prescriptions   No medications on file  Previous Medications   AMINO ACIDS-PROTEIN HYDROLYS (FEEDING SUPPLEMENT, PRO-STAT SUGAR FREE 64,) LIQD    Place 30 mLs into feeding tube 3 (three) times daily.   AMPHETAMINE-DEXTROAMPHETAMINE (ADDERALL) 20 MG TABLET    Place 1 tablet (20 mg total) into feeding tube 2 (two) times daily.   ANTISEPTIC ORAL RINSE (CPC / CETYLPYRIDINIUM CHLORIDE 0.05%) 0.05 % LIQD SOLUTION    7 mLs by Mouth Rinse route 2 times daily at 12 noon and 4 pm.   ASPIRIN 81 MG TABLET    Place 1 tablet (81 mg total) into feeding tube daily.   ATORVASTATIN (LIPITOR) 20 MG TABLET    Place 1 tablet (20 mg total) into feeding tube daily at 6 PM.   CHLORHEXIDINE (PERIDEX) 0.12 % SOLUTION    15 mLs by Mouth Rinse route 2 (two) times daily.   CLOPIDOGREL (PLAVIX) 75 MG TABLET    Place 1 tablet (75 mg total) into feeding tube daily.   LEVOTHYROXINE (SYNTHROID, LEVOTHROID) 75 MCG TABLET    Place 1 tablet (75 mcg  total) into feeding tube daily before breakfast.   MORPHINE SULFATE (MORPHINE CONCENTRATE) 10 MG/0.5ML SOLN CONCENTRATED SOLUTION    Place 0.5 mLs (10 mg total) into feeding tube every 2 (two) hours as needed for moderate pain, severe pain or shortness of breath.   NUTRITIONAL SUPPLEMENTS (FEEDING SUPPLEMENT, GLUCERNA 1.2 CAL,) LIQD    Place 474 mLs into feeding tube 3 (three) times daily.  Use 122m free water before and after each tube feed   ONDANSETRON (ZOFRAN) 8 MG TABLET    Place 1 tablet (8 mg total) into feeding tube every 8 (eight) hours as needed for nausea or vomiting.   THIAMINE (VITAMIN B-1) 100 MG TABLET    Place 1 tablet (100 mg total) into feeding tube daily. Take 1 tablet TID for one week, then 1 tablet daily thereafter  Modified Medications   No medications on file  Discontinued Medications   No medications on file    Review of Systems  Unable to perform ROS: Other  intermittent confusion  Filed Vitals:   06/08/14 1705  BP: 136/76  Pulse: 80  Temp: 97.8 F (36.6 C)  Weight: 173 lb (78.472 kg)  SpO2: 95%   Body mass index is 23.46 kg/(m^2).  Physical Exam  Constitutional: He appears well-developed. No distress.  Sitting in w/c in NAD.  frail appearing  HENT:  Mouth/Throat: Oropharynx is clear and moist.  No tongue lesion seen anteriorly  Eyes: EOM are normal. Pupils are equal, round, and reactive to light. No scleral icterus.  No asymmetry of pupils noted today  Neck: Neck supple. Carotid bruit is not present. No tracheal deviation present. No thyromegaly present.  Cardiovascular: Normal rate, regular rhythm, normal heart sounds and intact distal pulses.  Exam reveals no gallop and no friction rub.   No murmur heard. no distal LE swelling. No calf TTP  Pulmonary/Chest: Effort normal and breath sounds normal. No stridor. He has no wheezes. He has no rales. He exhibits no tenderness.  Abdominal: Soft. Bowel sounds are normal. He exhibits no distension, no  abdominal bruit, no pulsatile midline mass and no mass. There is no tenderness. There is no rebound and no guarding.  Lymphadenopathy:    He has cervical adenopathy (left lateral node egg sized).  Neurological: He is alert.  Skin: Skin is warm and dry. No rash noted.  Psychiatric: He has a normal mood and affect. His behavior is normal.     Labs reviewed: Admission on 06/04/2014, Discharged on 06/05/2014  Component Date Value Ref Range Status  . WBC 06/04/2014 10.3  4.0 - 10.5 K/uL Final  . RBC 06/04/2014 3.60* 4.22 - 5.81 MIL/uL Final  . Hemoglobin 06/04/2014 10.3* 13.0 - 17.0 g/dL Final  . HCT 06/04/2014 30.7* 39.0 - 52.0 % Final  . MCV 06/04/2014 85.3  78.0 - 100.0 fL Final  . MCH 06/04/2014 28.6  26.0 - 34.0 pg Final  . MCHC 06/04/2014 33.6  30.0 - 36.0 g/dL Final  . RDW 06/04/2014 13.4  11.5 - 15.5 % Final  . Platelets 06/04/2014 481* 150 - 400 K/uL Final  . Neutrophils Relative % 06/04/2014 87* 43 - 77 % Final  . Lymphocytes Relative 06/04/2014 8* 12 - 46 % Final  . Monocytes Relative 06/04/2014 5  3 - 12 % Final  . Eosinophils Relative 06/04/2014 0  0 - 5 % Final  . Basophils Relative 06/04/2014 0  0 - 1 % Final  . Neutro Abs 06/04/2014 9.0* 1.7 - 7.7 K/uL Final  . Lymphs Abs 06/04/2014 0.8  0.7 - 4.0 K/uL Final  . Monocytes Absolute 06/04/2014 0.5  0.1 - 1.0 K/uL Final  . Eosinophils Absolute 06/04/2014 0.0  0.0 - 0.7 K/uL Final  . Basophils Absolute 06/04/2014 0.0  0.0 - 0.1 K/uL Final  . RBC Morphology 06/04/2014 POLYCHROMASIA PRESENT   Final  . WBC Morphology 06/04/2014 MILD LEFT SHIFT (1-5% METAS, OCC MYELO, OCC  BANDS)   Final  . Sodium 06/04/2014 132* 135 - 145 mmol/L Final  . Potassium 06/04/2014 4.6  3.5 - 5.1 mmol/L Final  . Chloride 06/04/2014 96* 101 - 111 mmol/L Final  . CO2 06/04/2014 26  22 - 32 mmol/L Final  . Glucose, Bld 06/04/2014 111* 65 - 99 mg/dL Final  . BUN 06/04/2014 33* 6 - 20 mg/dL Final  . Creatinine, Ser 06/04/2014 1.01  0.61 - 1.24 mg/dL  Final  . Calcium 06/04/2014 9.5  8.9 - 10.3 mg/dL Final  . Total Protein 06/04/2014 7.0  6.5 - 8.1 g/dL Final  . Albumin 06/04/2014 3.0* 3.5 - 5.0 g/dL Final  . AST 06/04/2014 61* 15 - 41 U/L Final  . ALT 06/04/2014 93* 17 - 63 U/L Final  . Alkaline Phosphatase 06/04/2014 109  38 - 126 U/L Final  . Total Bilirubin 06/04/2014 0.4  0.3 - 1.2 mg/dL Final  . GFR calc non Af Amer 06/04/2014 >60  >60 mL/min Final  . GFR calc Af Amer 06/04/2014 >60  >60 mL/min Final   Comment: (NOTE) The eGFR has been calculated using the CKD EPI equation. This calculation has not been validated in all clinical situations. eGFR's persistently <60 mL/min signify possible Chronic Kidney Disease.   . Anion gap 06/04/2014 10  5 - 15 Final  . Specimen Description 06/04/2014 URINE, RANDOM   Final  . Special Requests 06/04/2014 NONE   Final  . Colony Count 06/04/2014    Final                   Value:15,000 COLONIES/ML Performed at [facility]   . Culture 06/04/2014    Final                   Value:Multiple bacterial morphotypes present, none predominant. Suggest appropriate recollection if clinically indicated. Performed at [facility]   . Report Status 06/04/2014 06/05/2014 FINAL   Final  . Color, Urine 06/04/2014 YELLOW  YELLOW Final  . APPearance 06/04/2014 CLEAR  CLEAR Final  . Specific Gravity, Urine 06/04/2014 1.019  1.005 - 1.030 Final  . pH 06/04/2014 5.5  5.0 - 8.0 Final  . Glucose, UA 06/04/2014 NEGATIVE  NEGATIVE mg/dL Final  . Hgb urine dipstick 06/04/2014 NEGATIVE  NEGATIVE Final  . Bilirubin Urine 06/04/2014 NEGATIVE  NEGATIVE Final  . Ketones, ur 06/04/2014 NEGATIVE  NEGATIVE mg/dL Final  . Protein, ur 06/04/2014 NEGATIVE  NEGATIVE mg/dL Final  . Urobilinogen, UA 06/04/2014 1.0  0.0 - 1.0 mg/dL Final  . Nitrite 06/04/2014 NEGATIVE  NEGATIVE Final  . Leukocytes, UA 06/04/2014 SMALL* NEGATIVE Final  . Squamous Epithelial / LPF 06/04/2014 RARE  RARE Final  . WBC, UA 06/04/2014  0-2  <3 WBC/hpf Final  . Bacteria, UA 06/04/2014 RARE  RARE Final  Admission on 05/25/2014, Discharged on 06/03/2014  No results displayed because visit has over 200 results.    Hospital Outpatient Visit on 05/24/2014  Component Date Value Ref Range Status  . WBC 05/24/2014 1.9* 4.0 - 10.3 10e3/uL Final  . NEUT# 05/24/2014 1.0* 1.5 - 6.5 10e3/uL Final  . HGB 05/24/2014 10.1* 13.0 - 17.1 g/dL Final  . HCT 05/24/2014 29.7* 38.4 - 49.9 % Final  . Platelets 05/24/2014 368  140 - 400 10e3/uL Final  . MCV 05/24/2014 86.6  79.3 - 98.0 fL Final  . MCH 05/24/2014 29.4  27.2 - 33.4 pg Final  . MCHC 05/24/2014 34.0  32.0 - 36.0 g/dL Final  .  RBC 05/24/2014 3.43* 4.20 - 5.82 10e6/uL Final  . RDW 05/24/2014 13.2  11.0 - 14.6 % Final  . lymph# 05/24/2014 0.3* 0.9 - 3.3 10e3/uL Final  . MONO# 05/24/2014 0.6  0.1 - 0.9 10e3/uL Final  . Eosinophils Absolute 05/24/2014 0.0  0.0 - 0.5 10e3/uL Final  . Basophils Absolute 05/24/2014 0.0  0.0 - 0.1 10e3/uL Final  . NEUT% 05/24/2014 51.5  39.0 - 75.0 % Final  . LYMPH% 05/24/2014 17.2  14.0 - 49.0 % Final  . MONO% 05/24/2014 29.2* 0.0 - 14.0 % Final  . EOS% 05/24/2014 2.1  0.0 - 7.0 % Final  . BASO% 05/24/2014 0.0  0.0 - 2.0 % Final  . nRBC 05/24/2014 0  0 - 0 % Final  . Technologist Review 05/24/2014 2% Myelocytes   Final  Orders Only on 05/24/2014  Component Date Value Ref Range Status  . Glucose 05/24/2014 Negative  Negative mg/dL Final  . Bilirubin (Urine) 05/24/2014 Negative  Negative Final  . Ketones 05/24/2014 Negative  Negative mg/dL Final  . Specific Gravity, Urine 05/24/2014 1.015  1.003 - 1.035 Final  . Blood 05/24/2014 Negative  Negative Final  . pH 05/24/2014 6.0  4.6 - 8.0 Final  . Protein 05/24/2014 Negative  Negative- <30 mg/dL Final  . Urobilinogen, UR 05/24/2014 0.2  0.2 - 1 mg/dL Final  . Nitrite 05/24/2014 Negative  Negative Final  . Leukocyte Esterase 05/24/2014 Negative  Negative Final  . RBC / HPF 05/24/2014 0-2  0 - 2 Final    . WBC, UA 05/24/2014 0-2  0 - 2 Final  Hospital Outpatient Visit on 05/24/2014  Component Date Value Ref Range Status  . Vitamin B1 (Thiamine) 05/24/2014 34* 8 - 30 nmol/L Final  . Sodium 05/24/2014 136  136 - 145 mEq/L Final  . Potassium 05/24/2014 4.8  3.5 - 5.1 mEq/L Final  . Chloride 05/24/2014 100  98 - 109 mEq/L Final  . CO2 05/24/2014 24  22 - 29 mEq/L Final  . Glucose 05/24/2014 122  70 - 140 mg/dl Final  . BUN 05/24/2014 25.9  7.0 - 26.0 mg/dL Final  . Creatinine 05/24/2014 0.9  0.7 - 1.3 mg/dL Final  . Total Bilirubin 05/24/2014 0.33  0.20 - 1.20 mg/dL Final  . Alkaline Phosphatase 05/24/2014 95  40 - 150 U/L Final  . AST 05/24/2014 27  5 - 34 U/L Final  . ALT 05/24/2014 38  0 - 55 U/L Final  . Total Protein 05/24/2014 6.8  6.4 - 8.3 g/dL Final  . Albumin 05/24/2014 2.9* 3.5 - 5.0 g/dL Final  . Calcium 05/24/2014 8.9  8.4 - 10.4 mg/dL Final  . Anion Gap 05/24/2014 11  3 - 11 mEq/L Final  . EGFR 05/24/2014 82* >90 ml/min/1.73 m2 Final   eGFR is calculated using the CKD-EPI Creatinine Equation (2009)  Appointment on 05/20/2014  Component Date Value Ref Range Status  . WBC 05/20/2014 3.3* 4.0 - 10.3 10e3/uL Final  . NEUT# 05/20/2014 2.6  1.5 - 6.5 10e3/uL Final  . HGB 05/20/2014 9.8* 13.0 - 17.1 g/dL Final  . HCT 05/20/2014 28.8* 38.4 - 49.9 % Final  . Platelets 05/20/2014 298  140 - 400 10e3/uL Final  . MCV 05/20/2014 87.0  79.3 - 98.0 fL Final  . MCH 05/20/2014 29.6  27.2 - 33.4 pg Final  . MCHC 05/20/2014 34.0  32.0 - 36.0 g/dL Final  . RBC 05/20/2014 3.31* 4.20 - 5.82 10e6/uL Final  . RDW 05/20/2014 13.0  11.0 - 14.6 %  Final  . lymph# 05/20/2014 0.3* 0.9 - 3.3 10e3/uL Final  . MONO# 05/20/2014 0.4  0.1 - 0.9 10e3/uL Final  . Eosinophils Absolute 05/20/2014 0.0  0.0 - 0.5 10e3/uL Final  . Basophils Absolute 05/20/2014 0.0  0.0 - 0.1 10e3/uL Final  . NEUT% 05/20/2014 79.4* 39.0 - 75.0 % Final  . LYMPH% 05/20/2014 8.8* 14.0 - 49.0 % Final  . MONO% 05/20/2014 11.2  0.0  - 14.0 % Final  . EOS% 05/20/2014 0.6  0.0 - 7.0 % Final  . BASO% 05/20/2014 0.0  0.0 - 2.0 % Final  . Sodium 05/20/2014 134* 136 - 145 mEq/L Final  . Potassium 05/20/2014 4.8  3.5 - 5.1 mEq/L Final  . Chloride 05/20/2014 101  98 - 109 mEq/L Final  . CO2 05/20/2014 24  22 - 29 mEq/L Final  . Glucose 05/20/2014 110  70 - 140 mg/dl Final  . BUN 05/20/2014 26.8* 7.0 - 26.0 mg/dL Final  . Creatinine 05/20/2014 0.9  0.7 - 1.3 mg/dL Final  . Total Bilirubin 05/20/2014 0.42  0.20 - 1.20 mg/dL Final  . Alkaline Phosphatase 05/20/2014 93  40 - 150 U/L Final  . AST 05/20/2014 25  5 - 34 U/L Final  . ALT 05/20/2014 31  0 - 55 U/L Final  . Total Protein 05/20/2014 6.5  6.4 - 8.3 g/dL Final  . Albumin 05/20/2014 2.7* 3.5 - 5.0 g/dL Final  . Calcium 05/20/2014 8.8  8.4 - 10.4 mg/dL Final  . Anion Gap 05/20/2014 9  3 - 11 mEq/L Final  . EGFR 05/20/2014 82* >90 ml/min/1.73 m2 Final   eGFR is calculated using the CKD-EPI Creatinine Equation (2009)  . Magnesium 05/20/2014 2.0  1.5 - 2.5 mg/dl Final  Appointment on 05/03/2014  Component Date Value Ref Range Status  . WBC 05/03/2014 4.8  4.0 - 10.3 10e3/uL Final  . NEUT# 05/03/2014 3.7  1.5 - 6.5 10e3/uL Final  . HGB 05/03/2014 12.1* 13.0 - 17.1 g/dL Final  . HCT 05/03/2014 34.9* 38.4 - 49.9 % Final  . Platelets 05/03/2014 354  140 - 400 10e3/uL Final  . MCV 05/03/2014 87.0  79.3 - 98.0 fL Final  . MCH 05/03/2014 30.2  27.2 - 33.4 pg Final  . MCHC 05/03/2014 34.7  32.0 - 36.0 g/dL Final  . RBC 05/03/2014 4.01* 4.20 - 5.82 10e6/uL Final  . RDW 05/03/2014 12.6  11.0 - 14.6 % Final  . lymph# 05/03/2014 0.1* 0.9 - 3.3 10e3/uL Final  . MONO# 05/03/2014 0.9  0.1 - 0.9 10e3/uL Final  . Eosinophils Absolute 05/03/2014 0.0  0.0 - 0.5 10e3/uL Final  . Basophils Absolute 05/03/2014 0.0  0.0 - 0.1 10e3/uL Final  . NEUT% 05/03/2014 77.9* 39.0 - 75.0 % Final  . LYMPH% 05/03/2014 2.9* 14.0 - 49.0 % Final  . MONO% 05/03/2014 18.6* 0.0 - 14.0 % Final  . EOS%  05/03/2014 0.4  0.0 - 7.0 % Final  . BASO% 05/03/2014 0.2  0.0 - 2.0 % Final  . nRBC 05/03/2014 0  0 - 0 % Final  . Sodium 05/03/2014 136  136 - 145 mEq/L Final  . Potassium 05/03/2014 4.4  3.5 - 5.1 mEq/L Final  . Chloride 05/03/2014 101  98 - 109 mEq/L Final  . CO2 05/03/2014 25  22 - 29 mEq/L Final  . Glucose 05/03/2014 169* 70 - 140 mg/dl Final  . BUN 05/03/2014 19.5  7.0 - 26.0 mg/dL Final  . Creatinine 05/03/2014 0.9  0.7 - 1.3 mg/dL Final  .  Total Bilirubin 05/03/2014 0.53  0.20 - 1.20 mg/dL Final  . Alkaline Phosphatase 05/03/2014 97  40 - 150 U/L Final  . AST 05/03/2014 37* 5 - 34 U/L Final  . ALT 05/03/2014 49  0 - 55 U/L Final  . Total Protein 05/03/2014 7.1  6.4 - 8.3 g/dL Final  . Albumin 05/03/2014 3.0* 3.5 - 5.0 g/dL Final  . Calcium 05/03/2014 9.8  8.4 - 10.4 mg/dL Final  . Anion Gap 05/03/2014 10  3 - 11 mEq/L Final  . EGFR 05/03/2014 86* >90 ml/min/1.73 m2 Final   eGFR is calculated using the CKD-EPI Creatinine Equation (2009)  . Magnesium 05/03/2014 2.4  1.5 - 2.5 mg/dl Final  Appointment on 04/27/2014  Component Date Value Ref Range Status  . Sodium 04/27/2014 135* 136 - 145 mEq/L Final  . Potassium 04/27/2014 4.4  3.5 - 5.1 mEq/L Final  . Chloride 04/27/2014 101  98 - 109 mEq/L Final  . CO2 04/27/2014 22  22 - 29 mEq/L Final  . Glucose 04/27/2014 129  70 - 140 mg/dl Final  . BUN 04/27/2014 19.7  7.0 - 26.0 mg/dL Final  . Creatinine 04/27/2014 0.9  0.7 - 1.3 mg/dL Final  . Total Bilirubin 04/27/2014 0.37  0.20 - 1.20 mg/dL Final  . Alkaline Phosphatase 04/27/2014 95  40 - 150 U/L Final  . AST 04/27/2014 22  5 - 34 U/L Final  . ALT 04/27/2014 25  0 - 55 U/L Final  . Total Protein 04/27/2014 6.7  6.4 - 8.3 g/dL Final  . Albumin 04/27/2014 3.1* 3.5 - 5.0 g/dL Final  . Calcium 04/27/2014 9.4  8.4 - 10.4 mg/dL Final  . Anion Gap 04/27/2014 12* 3 - 11 mEq/L Final  . EGFR 04/27/2014 83* >90 ml/min/1.73 m2 Final   eGFR is calculated using the CKD-EPI Creatinine  Equation (2009)  . Magnesium 04/27/2014 2.1  1.5 - 2.5 mg/dl Final  . WBC 04/27/2014 1.7* 4.0 - 10.3 10e3/uL Final  . NEUT# 04/27/2014 0.9* 1.5 - 6.5 10e3/uL Final  . HGB 04/27/2014 12.1* 13.0 - 17.1 g/dL Final  . HCT 04/27/2014 34.9* 38.4 - 49.9 % Final  . Platelets 04/27/2014 295  140 - 400 10e3/uL Final  . MCV 04/27/2014 87.7  79.3 - 98.0 fL Final  . MCH 04/27/2014 30.4  27.2 - 33.4 pg Final  . MCHC 04/27/2014 34.7  32.0 - 36.0 g/dL Final  . RBC 04/27/2014 3.98* 4.20 - 5.82 10e6/uL Final  . RDW 04/27/2014 12.5  11.0 - 14.6 % Final  . lymph# 04/27/2014 0.4* 0.9 - 3.3 10e3/uL Final  . MONO# 04/27/2014 0.4  0.1 - 0.9 10e3/uL Final  . Eosinophils Absolute 04/27/2014 0.0  0.0 - 0.5 10e3/uL Final  . Basophils Absolute 04/27/2014 0.0  0.0 - 0.1 10e3/uL Final  . NEUT% 04/27/2014 54.8  39.0 - 75.0 % Final  . LYMPH% 04/27/2014 22.3  14.0 - 49.0 % Final  . MONO% 04/27/2014 21.7* 0.0 - 14.0 % Final  . EOS% 04/27/2014 0.6  0.0 - 7.0 % Final  . BASO% 04/27/2014 0.6  0.0 - 2.0 % Final  Appointment on 04/21/2014  Component Date Value Ref Range Status  . WBC 04/21/2014 5.4  4.0 - 10.3 10e3/uL Final  . NEUT# 04/21/2014 4.8  1.5 - 6.5 10e3/uL Final  . HGB 04/21/2014 13.2  13.0 - 17.1 g/dL Final  . HCT 04/21/2014 39.6  38.4 - 49.9 % Final  . Platelets 04/21/2014 179  140 - 400 10e3/uL Final  .  MCV 04/21/2014 88.4  79.3 - 98.0 fL Final  . MCH 04/21/2014 29.4  27.2 - 33.4 pg Final  . MCHC 04/21/2014 33.2  32.0 - 36.0 g/dL Final  . RBC 04/21/2014 4.47  4.20 - 5.82 10e6/uL Final  . RDW 04/21/2014 12.5  11.0 - 14.6 % Final  . lymph# 04/21/2014 0.3* 0.9 - 3.3 10e3/uL Final  . MONO# 04/21/2014 0.3  0.1 - 0.9 10e3/uL Final  . Eosinophils Absolute 04/21/2014 0.0  0.0 - 0.5 10e3/uL Final  . Basophils Absolute 04/21/2014 0.0  0.0 - 0.1 10e3/uL Final  . NEUT% 04/21/2014 88.8* 39.0 - 75.0 % Final  . LYMPH% 04/21/2014 4.9* 14.0 - 49.0 % Final  . MONO% 04/21/2014 5.8  0.0 - 14.0 % Final  . EOS% 04/21/2014  0.3  0.0 - 7.0 % Final  . BASO% 04/21/2014 0.2  0.0 - 2.0 % Final  . Sodium 04/21/2014 139  136 - 145 mEq/L Final  . Potassium 04/21/2014 4.3  3.5 - 5.1 mEq/L Final  . Chloride 04/21/2014 102  98 - 109 mEq/L Final  . CO2 04/21/2014 23  22 - 29 mEq/L Final  . Glucose 04/21/2014 140  70 - 140 mg/dl Final  . BUN 04/21/2014 25.5  7.0 - 26.0 mg/dL Final  . Creatinine 04/21/2014 1.0  0.7 - 1.3 mg/dL Final  . Total Bilirubin 04/21/2014 0.48  0.20 - 1.20 mg/dL Final  . Alkaline Phosphatase 04/21/2014 100  40 - 150 U/L Final  . AST 04/21/2014 19  5 - 34 U/L Final  . ALT 04/21/2014 21  0 - 55 U/L Final  . Total Protein 04/21/2014 6.9  6.4 - 8.3 g/dL Final  . Albumin 04/21/2014 3.5  3.5 - 5.0 g/dL Final  . Calcium 04/21/2014 9.4  8.4 - 10.4 mg/dL Final  . Anion Gap 04/21/2014 13* 3 - 11 mEq/L Final  . EGFR 04/21/2014 76* >90 ml/min/1.73 m2 Final   eGFR is calculated using the CKD-EPI Creatinine Equation (2009)  Hospital Outpatient Visit on 04/02/2014  Component Date Value Ref Range Status  . aPTT 04/02/2014 31  24 - 37 seconds Final  . Sodium 04/02/2014 139  135 - 145 mmol/L Final  . Potassium 04/02/2014 4.2  3.5 - 5.1 mmol/L Final  . Chloride 04/02/2014 105  96 - 112 mmol/L Final  . CO2 04/02/2014 25  19 - 32 mmol/L Final  . Glucose, Bld 04/02/2014 87  70 - 99 mg/dL Final  . BUN 04/02/2014 21  6 - 23 mg/dL Final  . Creatinine, Ser 04/02/2014 0.85  0.50 - 1.35 mg/dL Final  . Calcium 04/02/2014 9.6  8.4 - 10.5 mg/dL Final  . GFR calc non Af Amer 04/02/2014 85* >90 mL/min Final  . GFR calc Af Amer 04/02/2014 >90  >90 mL/min Final   Comment: (NOTE) The eGFR has been calculated using the CKD EPI equation. This calculation has not been validated in all clinical situations. eGFR's persistently <90 mL/min signify possible Chronic Kidney Disease.   . Anion gap 04/02/2014 9  5 - 15 Final  . WBC 04/02/2014 6.7  4.0 - 10.5 K/uL Final  . RBC 04/02/2014 4.96  4.22 - 5.81 MIL/uL Final  . Hemoglobin  04/02/2014 15.0  13.0 - 17.0 g/dL Final  . HCT 04/02/2014 44.3  39.0 - 52.0 % Final  . MCV 04/02/2014 89.3  78.0 - 100.0 fL Final  . MCH 04/02/2014 30.2  26.0 - 34.0 pg Final  . MCHC 04/02/2014 33.9  30.0 - 36.0 g/dL  Final  . RDW 04/02/2014 13.0  11.5 - 15.5 % Final  . Platelets 04/02/2014 201  150 - 400 K/uL Final  . Prothrombin Time 04/02/2014 12.8  11.6 - 15.2 seconds Final  . INR 04/02/2014 0.95  0.00 - 1.49 Final  There may be more visits with results that are not included.  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 Noted to be confused with dilated right eye. Subsequent encounter.  EXAM: CT ANGIOGRAPHY HEAD AND NECK  TECHNIQUE: Multidetector CT imaging of the head and neck was performed using the standard protocol during bolus administration of intravenous contrast. Multiplanar CT image reconstructions and MIPs were obtained to evaluate the vascular anatomy. Carotid stenosis measurements (when applicable) are obtained utilizing NASCET criteria, using the distal internal carotid diameter as the denominator.  CONTRAST:  50 cc Omnipaque 350.  COMPARISON:  06/04/2014 head CT. 05/27/2014 neck CT. 12/30/2012 brain MR.  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  No intracranial hemorrhage.  No CT evidence of large acute infarct.  No intracranial mass or abnormal enhancement.  Calvarium and skull base: Negative.  Paranasal sinuses: Minimal polypoid opacification right maxillary sinus.  Orbits: Negative.  CTA NECK  Aortic arch: 3 vessel aortic arch with minimal to mild atherosclerotic type changes.  Right carotid system: Mild narrowing proximal right common carotid artery accentuated by motion. Plaque right carotid bifurcation/ proximal right internal carotid artery without hemodynamically significant stenosis.   Left carotid system: Plaque left carotid bifurcation without hemodynamically significant stenosis. Plaque distal left internal carotid artery cervical segment with slight narrowing just below the skullbase.  Vertebral arteries:Codominant vertebral arteries with mild narrowing proximal left vertebral artery but without significant stenosis.  Skeleton: Cervical spondylotic changes.  Other neck: Irregular lingual tonsils more notable on the right may represent treated tumor. Adenopathy including large necrotic left level 2-3 lymph node and associated mass effect similar to the recent neck CT.  Tiny nodules right upper lobe measuring up to 3 mm. Small metastatic foci not excluded.  CTA HEAD  Anterior circulation: Calcified plaque with mild to slightly moderate narrowing cavernous segment internal carotid artery bilaterally.  Mild narrowing and irregularity M1 segment left middle cerebral artery.  Posterior circulation:  Mild narrowing distal left vertebral artery.  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  No intracranial hemorrhage.  No CT evidence of large acute infarct.  No intracranial mass or abnormal enhancement.  CTA NECK  No significant stenosis of either carotid bifurcation or vertebral artery.  Irregular lingual tonsils more notable on the right may represent treated tumor. Adenopathy including large necrotic left level 2-3 lymph node and associated mass effect similar to the recent neck CT.  Tiny nodules right upper lobe measuring up to 3 mm. Small metastatic foci not excluded.  CTA HEAD  Calcified plaque with mild to slightly moderate narrowing cavernous segment internal carotid artery bilaterally.  Mild narrowing and irregularity M1 segment left middle cerebral artery.  Mild narrowing distal left vertebral artery.   Electronically Signed   By: Genia Del M.D.    On: 06/04/2014 21:53   Ct Head Wo Contrast  06/04/2014   CLINICAL DATA:  Confusion, dilated right eye.  EXAM: CT HEAD WITHOUT CONTRAST  TECHNIQUE:  Contiguous axial images were obtained from the base of the skull through the vertex without intravenous contrast.  COMPARISON:  CT scan of May 25, 2014.  FINDINGS: Bony calvarium appears intact. Minimal diffuse cortical atrophy is noted. Stable ventricular prominence is noted compared to prior exam. Minimal chronic ischemic white matter disease is noted. No mass effect or midline shift is noted. There is no evidence of mass lesion, hemorrhage or acute infarction.  IMPRESSION: Minimal diffuse cortical atrophy. Minimal chronic ischemic white matter disease. Stable prominence of the lateral and third ventricles is noted. No significant change compared to prior exam.   Electronically Signed   By: Marijo Conception, M.D.   On: 06/04/2014 14:37   Ct Head Wo Contrast  05/25/2014   CLINICAL DATA:  Increasing confusion today, history of squamous cell carcinoma of the tongue  EXAM: CT HEAD WITHOUT CONTRAST  TECHNIQUE: Contiguous axial images were obtained from the base of the skull through the vertex without intravenous contrast.  COMPARISON:  12/29/2012  FINDINGS: Bony calvarium is intact. No gross soft tissue abnormality is noted. Prominence of the lateral ventricles is again identified and stable. No findings to suggest acute hemorrhage, acute infarction or space-occupying mass lesion are noted.  IMPRESSION: Stable ventricular prominence.  No acute abnormality is noted.   Electronically Signed   By: Inez Catalina M.D.   On: 05/25/2014 16:35   Ct Head W Wo Contrast  05/27/2014   CLINICAL DATA:  Tongue cancer.  Tongue swelling.  EXAM: CT HEAD WITHOUT AND WITH CONTRAST  TECHNIQUE: Contiguous axial images were obtained from the base of the skull through the vertex without and with intravenous contrast  CONTRAST:  118m OMNIPAQUE IOHEXOL 300 MG/ML  SOLN  COMPARISON:  CT head  05/25/2014  FINDINGS: Moderate ventricular enlargement appears slightly larger compared with the prior study. Prominent aqueduct. 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  Negative for acute infarct.  Negative for hemorrhage or mass.  No enhancing lesions identified following contrast infusion  Negative for skull lesion  Image quality degraded by motion.  IMPRESSION: Ventricular enlargement slightly more prominent. Findings suggest communicating hydrocephalus  Negative for metastatic disease or acute infarct.   Electronically Signed   By: CFranchot GalloM.D.   On: 05/27/2014 16:18   Ct Soft Tissue Neck W Contrast  05/27/2014   CLINICAL DATA:  Squamous cell carcinoma tongue.  Tongue swelling.  EXAM: CT NECK WITH CONTRAST  TECHNIQUE: Multidetector CT imaging of the neck was performed using the standard protocol following the bolus administration of intravenous contrast.  CONTRAST:  1063mOMNIPAQUE IOHEXOL 300 MG/ML  SOLN  COMPARISON:  PET-CT 03/18/2014  FINDINGS: Pharynx and larynx: Prior PET scan revealed a mass in the base of tongue in the midline which was ulcerated. This appears improved without mass lesion in the area post treatment. Epiglottis and larynx are negative.  Salivary glands: Negative  Thyroid: Negative  Lymph nodes: Necrotic lymph node in the left mid mid neck is slightly smaller now measuring 3.9 x 2.5 mm. Posterior level 4 lymph node also smaller now measuring 16 mm. Right level 2 lymph node measures 11 mm slightly smaller. No new adenopathy.  Mild skin edema related to radiation treatment.  Vascular: Carotid artery patent bilaterally. Jugular vein patent bilaterally. Right-sided Port-A-Cath.  Limited intracranial: Negative  Visualized orbits: Not visualized  Mastoids and visualized paranasal sinuses: Mild mucosal edema right maxillary sinus. Mastoid sinus clear bilaterally.  Skeleton: Cervical spondylosis.  No bony lesion.  Upper chest: Negative for mass or lung  nodule.  IMPRESSION: Cervical  adenopathy has improved since 03/18/2014 related to cancer treatment. Large necrotic node on the left is smaller. Additional posterior lymph node on the left is smaller as well. Small right level 2 lymph node slightly smaller. No new adenopathy  Tongue base mass difficult to see on the current study.   Electronically Signed   By: Franchot Gallo M.D.   On: 05/27/2014 15:58   Ct Angio Neck W/cm &/or Wo/cm  06/04/2014   CLINICAL DATA:  74 year old hypertensive male with hyperlipidemia being treated for base of tongue cancer with radiation therapy. Noted to be confused with dilated right eye. Subsequent encounter.  EXAM: CT ANGIOGRAPHY HEAD AND NECK  TECHNIQUE: Multidetector CT imaging of the head and neck was performed using the standard protocol during bolus administration of intravenous contrast. Multiplanar CT image reconstructions and MIPs were obtained to evaluate the vascular anatomy. Carotid stenosis measurements (when applicable) are obtained utilizing NASCET criteria, using the distal internal carotid diameter as the denominator.  CONTRAST:  50 cc Omnipaque 350.  COMPARISON:  06/04/2014 head CT. 05/27/2014 neck CT. 12/30/2012 brain MR.  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  No intracranial hemorrhage.  No CT evidence of large acute infarct.  No intracranial mass or abnormal enhancement.  Calvarium and skull base: Negative.  Paranasal sinuses: Minimal polypoid opacification right maxillary sinus.  Orbits: Negative.  CTA NECK  Aortic arch: 3 vessel aortic arch with minimal to mild atherosclerotic type changes.  Right carotid system: Mild narrowing proximal right common carotid artery accentuated by motion. Plaque right carotid bifurcation/ proximal right internal carotid artery without hemodynamically significant stenosis.  Left carotid system: Plaque left carotid bifurcation without hemodynamically significant stenosis. Plaque distal  left internal carotid artery cervical segment with slight narrowing just below the skullbase.  Vertebral arteries:Codominant vertebral arteries with mild narrowing proximal left vertebral artery but without significant stenosis.  Skeleton: Cervical spondylotic changes.  Other neck: Irregular lingual tonsils more notable on the right may represent treated tumor. Adenopathy including large necrotic left level 2-3 lymph node and associated mass effect similar to the recent neck CT.  Tiny nodules right upper lobe measuring up to 3 mm. Small metastatic foci not excluded.  CTA HEAD  Anterior circulation: Calcified plaque with mild to slightly moderate narrowing cavernous segment internal carotid artery bilaterally.  Mild narrowing and irregularity M1 segment left middle cerebral artery.  Posterior circulation:  Mild narrowing distal left vertebral artery.  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  No intracranial hemorrhage.  No CT evidence of large acute infarct.  No intracranial mass or abnormal enhancement.  CTA NECK  No significant stenosis of either carotid bifurcation or vertebral artery.  Irregular lingual tonsils more notable on the right may represent treated tumor. Adenopathy including large necrotic left level 2-3 lymph node and associated mass effect similar to the recent neck CT.  Tiny nodules right upper lobe measuring up to 3 mm. Small metastatic foci not excluded.  CTA HEAD  Calcified plaque with mild to slightly moderate narrowing cavernous segment internal carotid artery bilaterally.  Mild narrowing and irregularity M1 segment left middle cerebral artery.  Mild narrowing distal left vertebral artery.   Electronically Signed   By: Genia Del M.D.   On: 06/04/2014 21:53   Dg Chest Port 1 View  05/28/2014   CLINICAL  DATA:  Cough and shortness of breath.   Tongue cancer.  EXAM: PORTABLE CHEST - 1 VIEW  COMPARISON:  03/02/2014  FINDINGS: Right IJ Port-A-Cath has tip at the cavoatrial junction. Lungs are hypoinflated with mild linear density over the lateral left base and right midlung likely atelectasis. No pneumothorax. Minimal prominence of the perihilar vessels suggesting minimal vascular congestion. Cardiomediastinal silhouette is within normal. Remainder of the exam is unchanged.  IMPRESSION: Suggestion of minimal vascular congestion with mild linear atelectasis over the right midlung and left base.   Electronically Signed   By: Marin Olp M.D.   On: 05/28/2014 12:57   Dg Swallowing Func-speech Pathology  06/02/2014      Objective Swallowing Evaluation:    Patient Details  Name: SAMIR ISHAQ MRN: [id number] Date of Birth: May 25, 1940  Today's Date: 06/02/2014 Time: SLP Start Time (ACUTE ONLY): 1130-SLP Stop Time (ACUTE ONLY): 1210 SLP Time Calculation (min) (ACUTE ONLY): 40 min  Past Medical History:  Past Medical History  Diagnosis Date  . Hyperlipemia   . Arthritis   . ADHD (attention deficit hyperactivity disorder)   . Wears glasses   . Hypercholesterolemia   . Peyronie's disease   . Kidney stones   . TIA (transient ischemic attack)   . Hypertension   . Anxiety   . GERD (gastroesophageal reflux disease)   . 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 TIA (transient ischemic attack) 12/29/12    left facial droop   Past Surgical History:  Past Surgical History  Procedure Laterality Date  . Dupuytren contracture release  2012    left hand  . Tonsillectomy    . Shoulder arthroscopy w/ rotator cuff repair      left  . Colonoscopy    . Cystoscopy w/ ureteroscopy w/ lithotripsy  2011  . Hand surgery      right  . Dupuytren contracture release Right 10/30/2012    Procedure: DUPUYTREN CONTRACTURE RELEASE RIGHT PALM,RING AND SMALL  FINGER;  Surgeon: Cammie Sickle., MD;  Location: [place];  Service: Orthopedics;  Laterality:  Right;   HPI:  Other Pertinent Information: 74 yo male adm to [facility] with AMS on 05/25/14 -  suspected due to ETOH w/d, Wernicke's encephalopathy.  Past medical  history of Hyperlipemia; Arthritis; ADHD, Hypercholesterolemia; Peyronie's  disease; Kidney stones; TIA, Hypertension; Anxiety; GERD  (gastroesophageal reflux disease); CA of base of tongue (03/05/14).  Pt has  been undergoing chemoradiation tx.  PEG tube placed for nutrition prior to  initiation of cancer tx.  Swallow evaluation ordered.    Subjective: Pt's rad tx to end May 23rd. Is beginning 4th week rad. "How  many of these exercises you want me to do?"  Assessment / Plan / Recommendation CHL IP CLINICAL IMPRESSIONS 06/02/2014  Therapy Diagnosis Mild oral phase dysphagia;Moderate pharyngeal phase  dysphagia;Severe pharyngeal phase dysphagia  Clinical Impression Mild oral dysphagia due to mild weakness resulting in  delayed oral transiting and oral holding.    Moderately severe pharyngeal dysphagia characterized by decreased  laryngeal elevation/closure and poor tongue base retraction presumed to be  due to XRT.   Pt did not adequately sense gross vallecular residuals and  could not fully clear with dry or liquid swallows.  Pt also could not  cough/expectorate adequately to clear vallecular residuals.  Multiple  reflexive swallows noted x2 with extended breath hold during MBS.   Laryngeal penetration to vocal folds apparent with thin.  Pt is high aspiration risk with po at this time due to weakness,  dysphagia.  Recommend he be allowed ice chips and free water after oral  care to decrease disuse muscle atrophy and fibrosis.  Furhter recommend pt  undergo skilled SLP tx at SNF to maximize swallow rehabiliation.  Using  live video, provided feedback for pt.  Encouraged hiim to work on strength  of cough and expectoration.  Pt verbalized understanding but required max  cues to atttend to task during MBS.            CHL IP TREATMENT RECOMMENDATION 06/02/2014   Treatment Recommendations Therapy as outlined in treatment plan below     CHL IP DIET RECOMMENDATION 06/02/2014  SLP Diet Recommendations Ice chips PRN after oral care;Free water protocol  after oral care;NPO  Liquid Administration via (None)  Medication Administration Via alternative means  Compensations Minimize environmental distractions  Postural Changes and/or Swallow Maneuvers (None)     CHL IP OTHER RECOMMENDATIONS 06/02/2014  Recommended Consults (None)  Oral Care Recommendations Oral care QID  Other Recommendations (None)     No flowsheet data found.   CHL IP FREQUENCY AND DURATION 06/02/2014  Speech Therapy Frequency (ACUTE ONLY) min 2x/week  Treatment Duration 1 week      CHL IP REASON FOR REFERRAL 06/02/2014  Reason for Referral Objectively evaluate swallowing function     CHL IP ORAL PHASE 06/02/2014  Oral Phase Impaired      CHL IP PHARYNGEAL PHASE 06/02/2014  Pharyngeal Phase Impaired  Pharyngeal Comment vallecular residuals noted across consistencies, worse  with increased viscocity, pt with inconsistent sensation/awareness and did  not clear residuals with cued swallows, liquid swallows or expectoration      Luanna Salk, MS [facility] SLP [phone]      Assessment/Plan   ICD-9-CM ICD-10-CM   1. 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 Malignant neoplasm of base of tongue 141.0 C01   3. Dilated pupil 379.43 H57.04   4. PEG (percutaneous endoscopic gastrostomy) status V44.1 Z93.1   5. Protein-calorie malnutrition, severe 262 E43   6. Alcohol abuse 305.00 F10.10     --cont current meds as ordered  --PT/OT/ST as ordered  --may have ice chips with strict supervision.   --will need f/u modified barium swallow test. Remain NPO except ice chips as above. TF via Peg  --f/u with Rad Onc and Oncology as scheduled  --f/u with specialists as scheduled. May need neuology eval for possible NPH  --Peg tube care  --GOAL: short term rehab and d/c home when medically appropriate. Communicated with pt  and nursing.   Trea Carnegie S. Perlie Gold  [facility] [address] [place], [place] [address] [phone] Cell (Monday-Friday 8 AM - 5 PM) [phone] After 5 PM and follow prompts

## 2014-06-09 ENCOUNTER — Other Ambulatory Visit: Payer: Self-pay | Admitting: *Deleted

## 2014-06-09 NOTE — Telephone Encounter (Signed)
Oncology Nurse Navigator Documentation  Oncology Nurse Navigator Flowsheets 06/08/2014  Navigator Encounter Type Telephone  Patient's wife called.  She reported that she no longer wants to relocate Dylan Moreno to a different care facility.  She now feels better about the care provided by [facility] after conversation with [facility] Exec Director and Nursing Supervisor (specifically re: adjustment in bolus feeding schedule that eliminates the previously scheduled 0100 feeding);  meetings with physical therapist, Arrie Aran, and speech therapist Dava Najjar.    She stated GL is arranging transportation for this Friday's follow-up appt with Dr. Isidore Moos.  Patient Visit Type -  Treatment Phase -  Barriers/Navigation Needs -  Education -  Interventions -  Coordination of Care -  Time Spent with Patient [place], RN, BSN, [place] [facility] [phone]

## 2014-06-10 NOTE — Progress Notes (Signed)
Pain Status: none  Nutritional Status 1) Using a feeding tube?: yes - taking in Glucerna 1.5  Three times a day 474 ml with 160 mL water flush before each feeding.  PEG tube is intact. 2) Summary of daily intake: all nutrition through tube 3) Weight changes, if any: has lost 14 lbs since 06/01/13  Wt Readings from Last 3 Encounters:  06/08/14 173 lb (78.472 kg)  06/04/14 173 lb (78.472 kg)  06/02/14 185 lb (83.915 kg)   Swallowing Status: eating ice chips, all nutrition through his peg tube.  Smoking or chewing tobacco? Has quit smoking in March 9935  Dental (if applicable):  Next dental appointment is on 06/28/14  Using fluoride trays daily? No - was using until he was in the hospital. Will resume when he is back home.   Summary of last ENT visit is unknown. Next ENT visit is on unknown.  Summary of last medical oncology visit (if applicable) is: seen by Dr. Alvy Bimler while hospitalized.  Plan is to see him for follow up 2 weeks post hospitalization.   Next med/onc visit is on 06/17/14.  Will also have a barium swallow test on the same day.  Imaging done in the last month (if applicable) revealed:  7/0/17 IMPRESSION: CT HEAD  Ventricular prominence similar to prior MR suggesting normal pressure hydrocephalus.  No intracranial hemorrhage.  No CT evidence of large acute infarct.  No intracranial mass or abnormal enhancement.  CTA NECK  No significant stenosis of either carotid bifurcation or vertebral artery.  Irregular lingual tonsils more notable on the right may represent treated tumor. Adenopathy including large necrotic left level 2-3 lymph node and associated mass effect similar to the recent neck CT.  Tiny nodules right upper lobe measuring up to 3 mm. Small metastatic foci not excluded.  CTA HEAD  Calcified plaque with mild to slightly moderate narrowing cavernous segment internal carotid artery bilaterally.  Mild narrowing and irregularity M1  segment left middle cerebral artery.  Mild narrowing distal left vertebral artery.  05/28/14 - Chest X-Ray IMPRESSION: Suggestion of minimal vascular congestion with mild linear atelectasis over the right midlung and left base.  Other notable issues, if any: Hospitalized 05/25/14 - 06/03/14 for delirium.  Discharged to [facility].  He denies confusion today.  He is able to tell his name and birth date but was not able to say what today's date is or who is the president.  He reports his mouth is dry.  He does have a white coating on his tongue.  He is receiving mouth care at [facility].  He has dry, peeling skin on both sides of his neck.  He reports having fatigue and would really like to go home.  He is in a wheelchair today.  His gait was steady when standing to be weighed.  BP 124/63 mmHg  Pulse 92  Temp(Src) 98.4 F (36.9 C) (Oral)  Resp 16  Ht 6' (1.829 m)  Wt 171 lb 12.8 oz (77.928 kg)  BMI 23.30 kg/m2  SpO2 100%

## 2014-06-11 ENCOUNTER — Ambulatory Visit (HOSPITAL_BASED_OUTPATIENT_CLINIC_OR_DEPARTMENT_OTHER): Payer: Medicare Other

## 2014-06-11 ENCOUNTER — Encounter: Payer: Self-pay | Admitting: *Deleted

## 2014-06-11 ENCOUNTER — Encounter: Payer: Self-pay | Admitting: Radiation Oncology

## 2014-06-11 ENCOUNTER — Ambulatory Visit
Admission: RE | Admit: 2014-06-11 | Discharge: 2014-06-11 | Disposition: A | Discharge status: Home / Self Care | Payer: Medicare Other | Source: Ambulatory Visit | Attending: Radiation Oncology | Admitting: Radiation Oncology

## 2014-06-11 VITALS — BP 87/56 | HR 102 | Temp 98.4°F | Resp 16 | Ht 72.0 in | Wt 171.8 lb

## 2014-06-11 VITALS — BP 140/83 | HR 82 | Temp 97.0°F | Resp 18

## 2014-06-11 DIAGNOSIS — E860 Dehydration: Secondary | ICD-10-CM

## 2014-06-11 DIAGNOSIS — C01 Malignant neoplasm of base of tongue: Secondary | ICD-10-CM | POA: Diagnosis not present

## 2014-06-11 DIAGNOSIS — F909 Attention-deficit hyperactivity disorder, unspecified type: Secondary | ICD-10-CM | POA: Diagnosis not present

## 2014-06-11 DIAGNOSIS — G4700 Insomnia, unspecified: Secondary | ICD-10-CM | POA: Diagnosis not present

## 2014-06-11 MED ORDER — SODIUM CHLORIDE 0.9 % IV SOLN
INTRAVENOUS | Status: AC
  Administered 2014-06-11: 14:00:00 via INTRAVENOUS

## 2014-06-11 MED ORDER — HEPARIN SOD (PORK) LOCK FLUSH 100 UNIT/ML IV SOLN
500.0000 [IU] | Freq: Once | INTRAVENOUS | Status: AC
  Administered 2014-06-11: 500 [IU] via INTRAVENOUS
  Filled 2014-06-11: qty 5

## 2014-06-11 MED ORDER — SODIUM CHLORIDE 0.9 % IJ SOLN
10.0000 mL | INTRAMUSCULAR | Status: DC | PRN
  Administered 2014-06-11: 10 mL via INTRAVENOUS
  Filled 2014-06-11: qty 10

## 2014-06-11 NOTE — Patient Instructions (Signed)
Dehydration, Adult Dehydration is when you lose more fluids from the body than you take in. Vital organs like the kidneys, brain, and heart cannot function without a proper amount of fluids and salt. Any loss of fluids from the body can cause dehydration.  CAUSES   Vomiting.  Diarrhea.  Excessive sweating.  Excessive urine output.  Fever. SYMPTOMS  Mild dehydration  Thirst.  Dry lips.  Slightly dry mouth. Moderate dehydration  Very dry mouth.  Sunken eyes.  Skin does not bounce back quickly when lightly pinched and released.  Dark urine and decreased urine production.  Decreased tear production.  Headache. Severe dehydration  Very dry mouth.  Extreme thirst.  Rapid, weak pulse (more than 100 beats per minute at rest).  Cold hands and feet.  Not able to sweat in spite of heat and temperature.  Rapid breathing.  Blue lips.  Confusion and lethargy.  Difficulty being awakened.  Minimal urine production.  No tears. DIAGNOSIS  Your caregiver will diagnose dehydration based on your symptoms and your exam. Blood and urine tests will help confirm the diagnosis. The diagnostic evaluation should also identify the cause of dehydration. TREATMENT  Treatment of mild or moderate dehydration can often be done at home by increasing the amount of fluids that you drink. It is best to drink small amounts of fluid more often. Drinking too much at one time can make vomiting worse. Refer to the home care instructions below. Severe dehydration needs to be treated at the hospital where you will probably be given intravenous (IV) fluids that contain water and electrolytes. HOME CARE INSTRUCTIONS   Ask your caregiver about specific rehydration instructions.  Drink enough fluids to keep your urine clear or pale yellow.  Drink small amounts frequently if you have nausea and vomiting.  Eat as you normally do.  Avoid:  Foods or drinks high in sugar.  Carbonated  drinks.  Juice.  Extremely hot or cold fluids.  Drinks with caffeine.  Fatty, greasy foods.  Alcohol.  Tobacco.  Overeating.  Gelatin desserts.  Wash your hands well to avoid spreading bacteria and viruses.  Only take over-the-counter or prescription medicines for pain, discomfort, or fever as directed by your caregiver.  Ask your caregiver if you should continue all prescribed and over-the-counter medicines.  Keep all follow-up appointments with your caregiver. SEEK MEDICAL CARE IF:  You have abdominal pain and it increases or stays in one area (localizes).  You have a rash, stiff neck, or severe headache.  You are irritable, sleepy, or difficult to awaken.  You are weak, dizzy, or extremely thirsty. SEEK IMMEDIATE MEDICAL CARE IF:   You are unable to keep fluids down or you get worse despite treatment.  You have frequent episodes of vomiting or diarrhea.  You have blood or green matter (bile) in your vomit.  You have blood in your stool or your stool looks black and tarry.  You have not urinated in 6 to 8 hours, or you have only urinated a small amount of very dark urine.  You have a fever.  You faint. MAKE SURE YOU:   Understand these instructions.  Will watch your condition.  Will get help right away if you are not doing well or get worse. Document Released: 12/18/2004 Document Revised: 03/12/2011 Document Reviewed: 08/07/2010 ExitCare Patient Information 2015 [employer]. This information is not intended to replace advice given to you by your health care provider. Make sure you discuss any questions you have with your health care   provider.  

## 2014-06-11 NOTE — Progress Notes (Signed)
Radiation Oncology         [phone] ________________________________  Name: Dylan Moreno MRN: [id number]  Date: 06/11/2014  DOB: 05-Oct-1940  Follow-Up Visit Note  CC: Kandice Hams, MD  Jodi Marble, MD  Diagnosis and Prior Radiotherapy:       ICD-9-CM ICD-10-CM   1. Malignant neoplasm of base of tongue 141.0 C01 NM PET Image Restag (PS) Skull Base To Thigh     DISCONTINUED: 0.9 %  sodium chloride infusion   Indication for treatment:  curative      Radiation treatment dates:   04/06/2014-05/25/2014 Site/dose:     Base of tongue and bilateral neck / 70 Gy in 35 fractions to gross disease, 63 Gy in 35 fractions to high risk nodal echelons, and 56 Gy in 35 fractions to intermediate risk nodal echelons  Narrative:  The patient returns today for routine follow-up. Pt reports he is depressed sometimes. Doesn't feel like hurting others or himself. Denies being prescribed antidepressants in the past and denies a history of depression. Pt states he has developed difficulty reading. The pt currently resides at [facility] for daily physical therapy. The pt's wife notes that he is improving due to the therapy, but he is denying that the physical therapy is working.  Mental status changes are improving but not completely resolved.  Neurology followup was mistakenly canceled by wife   Pain Status: None. He is prescribed morphine prn, but is not using it. Nutritional Status  1) Using a feeding tube?: yes - taking in Glucerna 1.5 three times a day, 474 ml with 160 mL of water flush before and after each feeding. PEG tube is intact.  2) Summary of daily intake: all nutrition through tube 3) Weight changes, if any: has lost 14 lbs since 06/01/13 Swallowing Status: eating ice chips, all nutrition through his peg tube.  Smoking or chewing tobacco? Has quit smoking in March 3235 Dental (if applicable): Next dental appointment is on 06/28/14 Using fluoride trays daily? No - was using until he was  in the hospital. Will resume when he is back home.  Summary of last ENT visit is unknown. Next ENT visit is on unknown.  Summary of last medical oncology visit (if applicable) is: seen by Dr. Alvy Bimler while hospitalized. Plan is to see him for follow up 2 weeks post hospitalization. Next med/onc visit is on 06/17/14. Will also have a barium swallow test on the same day.  ALLERGIES:  has No Known Allergies.  Meds: Current Outpatient Prescriptions  Medication Sig Dispense Refill  . Amino Acids-Protein Hydrolys (FEEDING SUPPLEMENT, PRO-STAT SUGAR FREE 64,) LIQD Place 30 mLs into feeding tube 3 (three) times daily. 900 mL 0  . amphetamine-dextroamphetamine (ADDERALL) 20 MG tablet Place 1 tablet (20 mg total) into feeding tube 2 (two) times daily. 60 tablet 0  . antiseptic oral rinse (CPC / CETYLPYRIDINIUM CHLORIDE 0.05%) 0.05 % LIQD solution 7 mLs by Mouth Rinse route 2 times daily at 12 noon and 4 pm. 946 mL 0  . aspirin 81 MG tablet Place 1 tablet (81 mg total) into feeding tube daily. 30 tablet 0  . atorvastatin (LIPITOR) 20 MG tablet Place 1 tablet (20 mg total) into feeding tube daily at 6 PM. 30 tablet 0  . chlorhexidine (PERIDEX) 0.12 % solution 15 mLs by Mouth Rinse route 2 (two) times daily. 120 mL 0  . clopidogrel (PLAVIX) 75 MG tablet Place 1 tablet (75 mg total) into feeding tube daily. 30 tablet 0  .  levothyroxine (SYNTHROID, LEVOTHROID) 75 MCG tablet Place 1 tablet (75 mcg total) into feeding tube daily before breakfast. 30 tablet 0  . Morphine Sulfate (MORPHINE CONCENTRATE) 10 MG/0.5ML SOLN concentrated solution Place 0.5 mLs (10 mg total) into feeding tube every 2 (two) hours as needed for moderate pain, severe pain or shortness of breath. 30 mL 0  . Nutritional Supplements (FEEDING SUPPLEMENT, GLUCERNA 1.2 CAL,) LIQD Place 474 mLs into feeding tube 3 (three) times daily. Use 125mL free water before and after each tube feed 1500 mL   . ondansetron (ZOFRAN) 8 MG tablet Place 1 tablet (8  mg total) into feeding tube every 8 (eight) hours as needed for nausea or vomiting. 30 tablet 1  . thiamine (VITAMIN B-1) 100 MG tablet Place 1 tablet (100 mg total) into feeding tube daily. Take 1 tablet TID for one week, then 1 tablet daily thereafter 30 tablet 0   No current facility-administered medications for this encounter.   Facility-Administered Medications Ordered in Other Encounters  Medication Dose Route Frequency Provider Last Rate Last Dose  . 0.9 %  sodium chloride infusion   Intravenous Continuous Eppie Gibson, MD 500 mL/hr at 06/11/14 1330      Physical Findings: The patient is in no acute distress. Patient is alert and oriented.  orthostasis noted. Vitals with BMI 06/11/2014 06/11/2014 06/08/2014  Height  6\' 0"    Weight  171 lbs 13 oz 173 lbs  BMI  88.8   Systolic 87 280 034  Diastolic 56 63 76  Pulse 917 92 80  Respirations  16    Wt Readings from Last 3 Encounters:  06/11/14 171 lb 12.8 oz (77.928 kg)  06/08/14 173 lb (78.472 kg)  06/04/14 173 lb (78.472 kg)    height is 6' (1.829 m) and weight is 171 lb 12.8 oz (77.928 kg). His oral temperature is 98.4 F (36.9 C). His blood pressure is 87/56 and his pulse is 102. His respiration is 16 and oxygen saturation is 100%.  Right pupil is slightly more dilated than the left. Persistent level II left neck mass which is almost 4 finger breadths in its greatest dimension. Skin is dry with some persisting desquamation. Oropharynx shows thick saliva and a faint white coating on his tongue; no other lesions noted. Both pupils respond directly and indirectly to light.  Lungs are clear to auscultation bilaterally. Heart has regular rate and rhythm.  Lab Findings: Lab Results  Component Value Date   WBC 10.3 06/04/2014   HGB 10.3* 06/04/2014   HCT 30.7* 06/04/2014   MCV 85.3 06/04/2014   PLT 481* 06/04/2014    Lab Results  Component Value Date   TSH 2.987 05/27/2014    Radiographic Findings: Ct Angio Head W/cm &/or Wo  Cm  06/04/2014   CLINICAL DATA:  74 year old hypertensive male with hyperlipidemia being treated for base of tongue cancer with radiation therapy. Noted to be confused with dilated right eye. Subsequent encounter.  EXAM: CT ANGIOGRAPHY HEAD AND NECK  TECHNIQUE: Multidetector CT imaging of the head and neck was performed using the standard protocol during bolus administration of intravenous contrast. Multiplanar CT image reconstructions and MIPs were obtained to evaluate the vascular anatomy. Carotid stenosis measurements (when applicable) are obtained utilizing NASCET criteria, using the distal internal carotid diameter as the denominator.  CONTRAST:  50 cc Omnipaque 350.  COMPARISON:  06/04/2014 head CT. 05/27/2014 neck CT. 12/30/2012 brain MR.  FINDINGS: CT HEAD  Brain: Ventricular prominence similar to prior MR suggesting normal pressure  hydrocephalus.  No intracranial hemorrhage.  No CT evidence of large acute infarct.  No intracranial mass or abnormal enhancement.  Calvarium and skull base: Negative.  Paranasal sinuses: Minimal polypoid opacification right maxillary sinus.  Orbits: Negative.  CTA NECK  Aortic arch: 3 vessel aortic arch with minimal to mild atherosclerotic type changes.  Right carotid system: Mild narrowing proximal right common carotid artery accentuated by motion. Plaque right carotid bifurcation/ proximal right internal carotid artery without hemodynamically significant stenosis.  Left carotid system: Plaque left carotid bifurcation without hemodynamically significant stenosis. Plaque distal left internal carotid artery cervical segment with slight narrowing just below the skullbase.  Vertebral arteries:Codominant vertebral arteries with mild narrowing proximal left vertebral artery but without significant stenosis.  Skeleton: Cervical spondylotic changes.  Other neck: Irregular lingual tonsils more notable on the right may represent treated tumor. Adenopathy including large necrotic left  level 2-3 lymph node and associated mass effect similar to the recent neck CT.  Tiny nodules right upper lobe measuring up to 3 mm. Small metastatic foci not excluded.  CTA HEAD  Anterior circulation: Calcified plaque with mild to slightly moderate narrowing cavernous segment internal carotid artery bilaterally.  Mild narrowing and irregularity M1 segment left middle cerebral artery.  Posterior circulation:  Mild narrowing distal left vertebral artery.  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  No intracranial hemorrhage.  No CT evidence of large acute infarct.  No intracranial mass or abnormal enhancement.  CTA NECK  No significant stenosis of either carotid bifurcation or vertebral artery.  Irregular lingual tonsils more notable on the right may represent treated tumor. Adenopathy including large necrotic left level 2-3 lymph node and associated mass effect similar to the recent neck CT.  Tiny nodules right upper lobe measuring up to 3 mm. Small metastatic foci not excluded.  CTA HEAD  Calcified plaque with mild to slightly moderate narrowing cavernous segment internal carotid artery bilaterally.  Mild narrowing and irregularity M1 segment left middle cerebral artery.  Mild narrowing distal left vertebral artery.   Electronically Signed   By: Genia Del M.D.   On: 06/04/2014 21:53   Ct Head Wo Contrast  06/04/2014   CLINICAL DATA:  Confusion, dilated right eye.  EXAM: CT HEAD WITHOUT CONTRAST  TECHNIQUE: Contiguous axial images were obtained from the base of the skull through the vertex without intravenous contrast.  COMPARISON:  CT scan of May 25, 2014.  FINDINGS: Bony calvarium appears intact. Minimal diffuse cortical atrophy is noted. Stable ventricular prominence is noted compared to prior exam. Minimal chronic ischemic white matter disease is noted. No mass  effect or midline shift is noted. There is no evidence of mass lesion, hemorrhage or acute infarction.  IMPRESSION: Minimal diffuse cortical atrophy. Minimal chronic ischemic white matter disease. Stable prominence of the lateral and third ventricles is noted. No significant change compared to prior exam.   Electronically Signed   By: Marijo Conception, M.D.   On: 06/04/2014 14:37   Ct Head Wo Contrast  05/25/2014   CLINICAL DATA:  Increasing confusion today, history of squamous cell carcinoma of the tongue  EXAM: CT HEAD WITHOUT CONTRAST  TECHNIQUE: Contiguous axial images were obtained from the base of the skull through the vertex without intravenous contrast.  COMPARISON:  12/29/2012  FINDINGS: Bony calvarium is intact. No gross soft tissue abnormality is noted. Prominence of the lateral ventricles is  again identified and stable. No findings to suggest acute hemorrhage, acute infarction or space-occupying mass lesion are noted.  IMPRESSION: Stable ventricular prominence.  No acute abnormality is noted.   Electronically Signed   By: Inez Catalina M.D.   On: 05/25/2014 16:35   Ct Head W Wo Contrast  05/27/2014   CLINICAL DATA:  Tongue cancer.  Tongue swelling.  EXAM: CT HEAD WITHOUT AND WITH CONTRAST  TECHNIQUE: Contiguous axial images were obtained from the base of the skull through the vertex without and with intravenous contrast  CONTRAST:  138mL OMNIPAQUE IOHEXOL 300 MG/ML  SOLN  COMPARISON:  CT head 05/25/2014  FINDINGS: Moderate ventricular enlargement appears slightly larger compared with the prior study. Prominent aqueduct. 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  Negative for acute infarct.  Negative for hemorrhage or mass.  No enhancing lesions identified following contrast infusion  Negative for skull lesion  Image quality degraded by motion.  IMPRESSION: Ventricular enlargement slightly more prominent. Findings suggest communicating hydrocephalus  Negative for metastatic disease or acute infarct.    Electronically Signed   By: Franchot Gallo M.D.   On: 05/27/2014 16:18   Ct Soft Tissue Neck W Contrast  05/27/2014   CLINICAL DATA:  Squamous cell carcinoma tongue.  Tongue swelling.  EXAM: CT NECK WITH CONTRAST  TECHNIQUE: Multidetector CT imaging of the neck was performed using the standard protocol following the bolus administration of intravenous contrast.  CONTRAST:  159mL OMNIPAQUE IOHEXOL 300 MG/ML  SOLN  COMPARISON:  PET-CT 03/18/2014  FINDINGS: Pharynx and larynx: Prior PET scan revealed a mass in the base of tongue in the midline which was ulcerated. This appears improved without mass lesion in the area post treatment. Epiglottis and larynx are negative.  Salivary glands: Negative  Thyroid: Negative  Lymph nodes: Necrotic lymph node in the left mid mid neck is slightly smaller now measuring 3.9 x 2.5 mm. Posterior level 4 lymph node also smaller now measuring 16 mm. Right level 2 lymph node measures 11 mm slightly smaller. No new adenopathy.  Mild skin edema related to radiation treatment.  Vascular: Carotid artery patent bilaterally. Jugular vein patent bilaterally. Right-sided Port-A-Cath.  Limited intracranial: Negative  Visualized orbits: Not visualized  Mastoids and visualized paranasal sinuses: Mild mucosal edema right maxillary sinus. Mastoid sinus clear bilaterally.  Skeleton: Cervical spondylosis.  No bony lesion.  Upper chest: Negative for mass or lung nodule.  IMPRESSION: Cervical adenopathy has improved since 03/18/2014 related to cancer treatment. Large necrotic node on the left is smaller. Additional posterior lymph node on the left is smaller as well. Small right level 2 lymph node slightly smaller. No new adenopathy  Tongue base mass difficult to see on the current study.   Electronically Signed   By: Franchot Gallo M.D.   On: 05/27/2014 15:58   Ct Angio Neck W/cm &/or Wo/cm  06/04/2014   CLINICAL DATA:  74 year old hypertensive male with hyperlipidemia being treated for base of  tongue cancer with radiation therapy. Noted to be confused with dilated right eye. Subsequent encounter.  EXAM: CT ANGIOGRAPHY HEAD AND NECK  TECHNIQUE: Multidetector CT imaging of the head and neck was performed using the standard protocol during bolus administration of intravenous contrast. Multiplanar CT image reconstructions and MIPs were obtained to evaluate the vascular anatomy. Carotid stenosis measurements (when applicable) are obtained utilizing NASCET criteria, using the distal internal carotid diameter as the denominator.  CONTRAST:  50 cc Omnipaque 350.  COMPARISON:  06/04/2014 head CT. 05/27/2014 neck CT. 12/30/2012 brain MR.  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  No intracranial hemorrhage.  No CT evidence of large acute infarct.  No intracranial mass or abnormal enhancement.  Calvarium and skull base: Negative.  Paranasal sinuses: Minimal polypoid opacification right maxillary sinus.  Orbits: Negative.  CTA NECK  Aortic arch: 3 vessel aortic arch with minimal to mild atherosclerotic type changes.  Right carotid system: Mild narrowing proximal right common carotid artery accentuated by motion. Plaque right carotid bifurcation/ proximal right internal carotid artery without hemodynamically significant stenosis.  Left carotid system: Plaque left carotid bifurcation without hemodynamically significant stenosis. Plaque distal left internal carotid artery cervical segment with slight narrowing just below the skullbase.  Vertebral arteries:Codominant vertebral arteries with mild narrowing proximal left vertebral artery but without significant stenosis.  Skeleton: Cervical spondylotic changes.  Other neck: Irregular lingual tonsils more notable on the right may represent treated tumor. Adenopathy including large necrotic left level 2-3 lymph node and associated mass effect similar to the recent neck CT.  Tiny nodules right upper lobe  measuring up to 3 mm. Small metastatic foci not excluded.  CTA HEAD  Anterior circulation: Calcified plaque with mild to slightly moderate narrowing cavernous segment internal carotid artery bilaterally.  Mild narrowing and irregularity M1 segment left middle cerebral artery.  Posterior circulation:  Mild narrowing distal left vertebral artery.  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  No intracranial hemorrhage.  No CT evidence of large acute infarct.  No intracranial mass or abnormal enhancement.  CTA NECK  No significant stenosis of either carotid bifurcation or vertebral artery.  Irregular lingual tonsils more notable on the right may represent treated tumor. Adenopathy including large necrotic left level 2-3 lymph node and associated mass effect similar to the recent neck CT.  Tiny nodules right upper lobe measuring up to 3 mm. Small metastatic foci not excluded.  CTA HEAD  Calcified plaque with mild to slightly moderate narrowing cavernous segment internal carotid artery bilaterally.  Mild narrowing and irregularity M1 segment left middle cerebral artery.  Mild narrowing distal left vertebral artery.   Electronically Signed   By: Genia Del M.D.   On: 06/04/2014 21:53   Dg Chest Port 1 View  05/28/2014   CLINICAL DATA:  Cough and shortness of breath.  Tongue cancer.  EXAM: PORTABLE CHEST - 1 VIEW  COMPARISON:  03/02/2014  FINDINGS: Right IJ Port-A-Cath has tip at the cavoatrial junction. Lungs are hypoinflated with mild linear density over the lateral left base and right midlung likely atelectasis. No pneumothorax. Minimal prominence of the perihilar vessels suggesting minimal vascular congestion. Cardiomediastinal silhouette is within normal. Remainder of the exam is unchanged.  IMPRESSION: Suggestion of minimal vascular congestion with mild linear atelectasis  over the right midlung and left base.   Electronically Signed   By: Marin Olp M.D.   On: 05/28/2014 12:57   Dg Swallowing Func-speech Pathology  06/02/2014      Objective Swallowing Evaluation:    Patient Details  Name: CLEMENTE DEWEY MRN: [id number] Date of Birth: Mar 12, 1940  Today's Date: 06/02/2014 Time: SLP Start Time (ACUTE ONLY): 1130-SLP Stop Time (ACUTE ONLY): 1210 SLP Time Calculation (min) (ACUTE ONLY): 40 min  Past Medical History:  Past Medical History  Diagnosis Date  . Hyperlipemia   . Arthritis   . ADHD (attention deficit hyperactivity disorder)   .  Wears glasses   . Hypercholesterolemia   . Peyronie's disease   . Kidney stones   . TIA (transient ischemic attack)   . Hypertension   . Anxiety   . GERD (gastroesophageal reflux disease)   . 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 TIA (transient ischemic attack) 12/29/12    left facial droop   Past Surgical History:  Past Surgical History  Procedure Laterality Date  . Dupuytren contracture release  2012    left hand  . Tonsillectomy    . Shoulder arthroscopy w/ rotator cuff repair      left  . Colonoscopy    . Cystoscopy w/ ureteroscopy w/ lithotripsy  2011  . Hand surgery      right  . Dupuytren contracture release Right 10/30/2012    Procedure: DUPUYTREN CONTRACTURE RELEASE RIGHT PALM,RING AND SMALL  FINGER;  Surgeon: Cammie Sickle., MD;  Location: [facility];  Service: Orthopedics;  Laterality: Right;   HPI:  Other Pertinent Information: 74 yo male adm to [facility] with AMS on 05/25/14 -  suspected due to ETOH w/d, Wernicke's encephalopathy.  Past medical  history of Hyperlipemia; Arthritis; ADHD, Hypercholesterolemia; Peyronie's  disease; Kidney stones; TIA, Hypertension; Anxiety; GERD  (gastroesophageal reflux disease); CA of base of tongue (03/05/14).  Pt has  been undergoing chemoradiation tx.  PEG tube placed for nutrition prior to  initiation of cancer tx.  Swallow evaluation ordered.    Subjective: Pt's rad  tx to end May 23rd. Is beginning 4th week rad. "How  many of these exercises you want me to do?"  Assessment / Plan / Recommendation CHL IP CLINICAL IMPRESSIONS 06/02/2014  Therapy Diagnosis Mild oral phase dysphagia;Moderate pharyngeal phase  dysphagia;Severe pharyngeal phase dysphagia  Clinical Impression Mild oral dysphagia due to mild weakness resulting in  delayed oral transiting and oral holding.    Moderately severe pharyngeal dysphagia characterized by decreased  laryngeal elevation/closure and poor tongue base retraction presumed to be  due to XRT.   Pt did not adequately sense gross vallecular residuals and  could not fully clear with dry or liquid swallows.  Pt also could not  cough/expectorate adequately to clear vallecular residuals.  Multiple  reflexive swallows noted x2 with extended breath hold during MBS.   Laryngeal penetration to vocal folds apparent with thin.    Pt is high aspiration risk with po at this time due to weakness,  dysphagia.  Recommend he be allowed ice chips and free water after oral  care to decrease disuse muscle atrophy and fibrosis.  Furhter recommend pt  undergo skilled SLP tx at SNF to maximize swallow rehabiliation.  Using  live video, provided feedback for pt.  Encouraged hiim to work on strength  of cough and expectoration.  Pt verbalized understanding but required max  cues to atttend to task during MBS.            CHL IP TREATMENT RECOMMENDATION 06/02/2014  Treatment Recommendations Therapy as outlined in treatment plan below     CHL IP DIET RECOMMENDATION 06/02/2014  SLP Diet Recommendations Ice chips PRN after oral care;Free water protocol  after oral care;NPO  Liquid Administration via (None)  Medication Administration Via alternative means  Compensations Minimize environmental distractions  Postural Changes and/or Swallow Maneuvers (None)     CHL IP OTHER RECOMMENDATIONS 06/02/2014  Recommended Consults (None)  Oral Care Recommendations Oral care QID  Other Recommendations  (None)  No flowsheet data found.   CHL IP FREQUENCY AND DURATION 06/02/2014  Speech Therapy Frequency (ACUTE ONLY) min 2x/week  Treatment Duration 1 week      CHL IP REASON FOR REFERRAL 06/02/2014  Reason for Referral Objectively evaluate swallowing function     CHL IP ORAL PHASE 06/02/2014  Oral Phase Impaired      CHL IP PHARYNGEAL PHASE 06/02/2014  Pharyngeal Phase Impaired  Pharyngeal Comment vallecular residuals noted across consistencies, worse  with increased viscocity, pt with inconsistent sensation/awareness and did  not clear residuals with cued swallows, liquid swallows or expectoration      Luanna Salk, MS [facility] SLP [phone]     Impression/Plan:    1) Head and Neck Cancer Status: Persistent neck mass, orthostasis, weight loss, and deconditioning. Resolving mental status changes.  2) Nutritional Status: All food is through his PEG tube. Lost 14 lbs since 06/02/14 PEG tube: Yes  3) Risk Factors: Former alcohol abuse.The patient has been educated about risk factors including alcohol and tobacco abuse; they understand that avoidance of alcohol and tobacco is important to prevent recurrences as well as other cancers.  4) Swallowing: Pt is being followed with a swallowing therapist at [facility]. Only allowed ice chips now  5) Dental: Encouraged to continue regular followup with dentistry, and dental hygiene including fluoride rinses.  6) Thyroid function:  Lab Results  Component Value Date   TSH 2.987 05/27/2014    7) Other: I made courtesy call to Dr. Serita Grit in [facility] to speed up his next appointment. I recommend the pt getting 1 L of IV fluids today. The pt is being transported by and currently resides at [facility]. I will order a PET scan in 6 weeks (much earlier than normal protocol, given neck mass). I gave a note to the pt's wife to pass along to [facility] for them to only give the morphine when he is in severe pain. The pt should talk to Dr. Alvy Bimler  about being prescribed an antidepressant. I advised the pt that since he is unable to read at the moment, he should start listening to audio recordings. Refer back to social work for future counseling when able, due to depression.  8) Follow-up in about 6 weeks with PET scan due to persistent neck mass. The patient was encouraged to call with any issues or questions before then. The pt is to see Dr. Alvy Bimler next week (6/16) with a barium swallowing test afterwards.   This document serves as a record of services personally performed by Eppie Gibson, MD. It was created on her behalf by Darcus Austin, a trained medical scribe. The creation of this record is based on the scribe's personal observations and the provider's statements to them. This document has been checked and approved by the attending provider.     _____________________________________   Eppie Gibson, MD

## 2014-06-11 NOTE — Progress Notes (Signed)
Oncology Nurse Navigator Documentation  Oncology Nurse Navigator Flowsheets 06/11/2014  Navigator Encounter Type Clinic/MDC  To provide support and encouragement, care continuity and to assess for needs, met with patient prior to f/u appt with Dr. Isidore Moos.  His wife accompanied him.  His mental status was much improved since my last visit with him in [place].  He stated he did not remember being confused, incoherent, hallucinating.  He reported that currently his ST memory is a challenge.  He is receiving PT and other services at [facility] which are apparently contributing to his improved well-being.  They verbalized understanding of 06/17/14 1115 f/u appt with Dr. Alvy Bimler. They understand I can be contacted with needs/concerns.   Patient Visit Type Radonc  Treatment Phase -  Barriers/Navigation Needs -  Education -  Interventions -  Coordination of Care -  Time Spent with Patient [name], RN, BSN, [place] [facility] [place] [phone]

## 2014-06-14 ENCOUNTER — Telehealth: Payer: Self-pay | Admitting: *Deleted

## 2014-06-14 ENCOUNTER — Telehealth: Payer: Self-pay | Admitting: Oncology

## 2014-06-14 NOTE — Telephone Encounter (Signed)
CALLED PATIENT TO INFORM OF PET ON 07-19-14- ARRIVAL TIME - 10:30 AM @ [facility] AND HIS FU WITH DR. Isidore Moos ON 07/21/14 @ 11:20 AM, SPOKE WITH PATIENT'S WIFE ANNA AND SHE IS AWARE OF THESE APPTS.

## 2014-06-14 NOTE — Telephone Encounter (Signed)
Dylan Moreno, Mr. Whittley's wife, to find out which pharmacy they use so that a prescription for Ativan could be called in to take before his PET scan.  Per Vicente Males, he still has 12 tablets of 0.5 mg Ativan left from his radiation treatments and does not need another prescription called in..  Advised her to have Dylan Moreno take 1 tablet 30 minutes before his PET scan on 07/19/14.  Vicente Males verbalized agreement and understanding.  Notified Dr. Isidore Moos.

## 2014-06-17 ENCOUNTER — Ambulatory Visit (INDEPENDENT_AMBULATORY_CARE_PROVIDER_SITE_OTHER): Payer: Medicare Other | Admitting: Neurology

## 2014-06-17 ENCOUNTER — Encounter: Payer: Self-pay | Admitting: Hematology and Oncology

## 2014-06-17 ENCOUNTER — Encounter: Payer: Self-pay | Admitting: Neurology

## 2014-06-17 ENCOUNTER — Ambulatory Visit (HOSPITAL_COMMUNITY)
Admission: RE | Admit: 2014-06-17 | Discharge: 2014-06-17 | Disposition: A | Discharge status: Home / Self Care | Payer: No Typology Code available for payment source | Source: Ambulatory Visit | Attending: Internal Medicine | Admitting: Internal Medicine

## 2014-06-17 ENCOUNTER — Telehealth: Payer: Self-pay | Admitting: Hematology and Oncology

## 2014-06-17 ENCOUNTER — Ambulatory Visit (HOSPITAL_BASED_OUTPATIENT_CLINIC_OR_DEPARTMENT_OTHER): Payer: Medicare Other | Admitting: Hematology and Oncology

## 2014-06-17 VITALS — BP 124/67 | HR 92 | Temp 97.8°F | Resp 18 | Ht 72.0 in | Wt 175.3 lb

## 2014-06-17 VITALS — BP 104/62 | HR 91 | Resp 16 | Ht 73.0 in | Wt 175.0 lb

## 2014-06-17 DIAGNOSIS — R413 Other amnesia: Secondary | ICD-10-CM

## 2014-06-17 DIAGNOSIS — C01 Malignant neoplasm of base of tongue: Secondary | ICD-10-CM

## 2014-06-17 DIAGNOSIS — F919 Conduct disorder, unspecified: Secondary | ICD-10-CM

## 2014-06-17 DIAGNOSIS — R4189 Other symptoms and signs involving cognitive functions and awareness: Secondary | ICD-10-CM

## 2014-06-17 DIAGNOSIS — F1996 Other psychoactive substance use, unspecified with psychoactive substance-induced persisting amnestic disorder: Secondary | ICD-10-CM

## 2014-06-17 DIAGNOSIS — R1310 Dysphagia, unspecified: Secondary | ICD-10-CM | POA: Insufficient documentation

## 2014-06-17 DIAGNOSIS — R269 Unspecified abnormalities of gait and mobility: Secondary | ICD-10-CM | POA: Diagnosis not present

## 2014-06-17 DIAGNOSIS — R634 Abnormal weight loss: Secondary | ICD-10-CM | POA: Diagnosis not present

## 2014-06-17 DIAGNOSIS — R1314 Dysphagia, pharyngoesophageal phase: Secondary | ICD-10-CM

## 2014-06-17 DIAGNOSIS — R4689 Other symptoms and signs involving appearance and behavior: Principal | ICD-10-CM

## 2014-06-17 HISTORY — DX: Dysphagia, unspecified: R13.10

## 2014-06-17 NOTE — Assessment & Plan Note (Signed)
He has significant weight loss related to recent dysphagia and delirium. Once he is discharged, I recommend he resume minimum 6 cans of nutritional supplement at home if possible. If he passed swallow evaluation, hopefully he can start swallowing soft diet.

## 2014-06-17 NOTE — Assessment & Plan Note (Signed)
The patient has dysphagia likely related to recent delirium and treatment. He has swallow evaluation scheduled for today. If he pass swallow evaluation, hopefully he can start taking an oral diet. His mucositis has resolved.

## 2014-06-17 NOTE — Assessment & Plan Note (Signed)
The patient tolerated treatment poorly. He has persistent palpable mass on clinical exam. He has PET CT scan scheduled for next month to assess response to treatment. In the meantime, I will continue supportive care visits

## 2014-06-17 NOTE — Progress Notes (Signed)
Follow-up Visit   Date: 06/17/2014    Dylan Moreno MRN: [id number] DOB: 08/20/1940   Interim History: Dylan Moreno is a 74 y.o. right-handed Caucasian male with history of hyperlipidemia, tobacco use, ADD, TIA (12/29/2012), squamous cell carcinoma s/p chemo/radiation (Spring 2016) returning to the clinic for new complaints of memory impairment.  The patient was accompanied to the clinic by self.  History of present illness: He presented to the Emergency Department on 12/29/2012 with acute onset of left facial droop, lasting one hour. He woke up that morning feeling dizzy and lightheaded. His son, who is a Agricultural consultant, noticed his facial droop and brought him to the ED for evaluation. He CT of the brain, MRI/A of the brain which did not show any acute stroke. There was a note of ventriculomegaly suggestive of NPH. US carotids showed patent ICAs and echocardiogram did not reveal an embolic source. He was previously taking lipitor 10mg  and was taking aspirin 81mg  daily. He was discharged on aspirin 325mg .  No prior personal or family history of stroke or TIA.   - Follow-up 05/04/2013:  At his last visit, I switched him to plavix.  48-hr holter testing did not show any arrhythmias.   He is tolerating it well and denies any new neurological symptoms. He is trying to cut back on smoking cigars.   UPDATE 08/04/2013:  He is doing well and has no new neurological symptoms.    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 He also underwent PEG tube placement. He was admitted to [facility] 5/45 - 06/03/2014 because of agitation, hallucinations, and delirium which had been ongoing for a week prior to presentation and thought to be due to combination of pain medications.  He also stopped taking Adderal one week prior to symptom onset   While hospitalized, his pain medications were slowly reduced and he started becoming less agitated.  He was discharged to rehab facility on 6/2.  The following day, patient's family noted that his right pupil was larger so was taken to the emergency department where neurology evaluated him. Exam was notable for right pupil 6 mm and left pupil 3 mm, round, reactive to light and accommodation--no APD.  CTA did not demonstrate any vascular abnormalities.  Pupil asymmetry lasted about 2 days.  Over the past week, family has noticed a huge improvement and say that he is almost back to his baseline but continues to have memory problems.  He cannot keep up with reading as well and reports to reading 5 hours per day.  His son feels that he does not remember to focus on tasks such as what is taught in PT (how to stand/sit, etc).    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   Medications:  Current Outpatient Prescriptions on File Prior to Visit  Medication Sig Dispense Refill  . Amino Acids-Protein Hydrolys (FEEDING SUPPLEMENT, PRO-STAT SUGAR FREE 64,) LIQD Place 30 mLs into feeding tube 3 (three) times daily. 900 mL 0  . amphetamine-dextroamphetamine (ADDERALL) 20 MG tablet Place 1 tablet (20 mg total) into feeding tube 2 (two) times daily. 60 tablet 0  . antiseptic oral rinse (CPC / CETYLPYRIDINIUM CHLORIDE 0.05%) 0.05 % LIQD solution 7 mLs by Mouth Rinse route 2 times daily  at 12 noon and 4 pm. 946 mL 0  . aspirin 81 MG tablet Place 1 tablet (81 mg total) into feeding tube daily. 30 tablet 0  . atorvastatin (LIPITOR) 20 MG tablet Place 1 tablet (20 mg total) into feeding tube daily at 6 PM. 30 tablet 0  . chlorhexidine (PERIDEX) 0.12 % solution 15 mLs by Mouth Rinse route 2 (two) times daily. 120 mL 0  . clopidogrel (PLAVIX) 75 MG tablet Place 1 tablet (75 mg total) into feeding tube daily. 30 tablet 0  . levothyroxine (SYNTHROID, LEVOTHROID) 75 MCG tablet Place  1 tablet (75 mcg total) into feeding tube daily before breakfast. 30 tablet 0  . Morphine Sulfate (MORPHINE CONCENTRATE) 10 MG/0.5ML SOLN concentrated solution Place 0.5 mLs (10 mg total) into feeding tube every 2 (two) hours as needed for moderate pain, severe pain or shortness of breath. 30 mL 0  . Nutritional Supplements (FEEDING SUPPLEMENT, GLUCERNA 1.2 CAL,) LIQD Place 474 mLs into feeding tube 3 (three) times daily. Use 163mL free water before and after each tube feed 1500 mL   . ondansetron (ZOFRAN) 8 MG tablet Place 1 tablet (8 mg total) into feeding tube every 8 (eight) hours as needed for nausea or vomiting. 30 tablet 1  . thiamine (VITAMIN B-1) 100 MG tablet Place 1 tablet (100 mg total) into feeding tube daily. Take 1 tablet TID for one week, then 1 tablet daily thereafter 30 tablet 0   No current facility-administered medications on file prior to visit.    Allergies: No Known Allergies   Review of Systems:  CONSTITUTIONAL: No fevers, chills, night sweats, or weight loss.   EYES: No visual changes or eye pain ENT: No hearing changes.  No history of nose bleeds.   RESPIRATORY: No cough, wheezing and shortness of breath.   CARDIOVASCULAR: Negative for chest pain, and palpitations.   GI: Negative for abdominal discomfort, blood in stools or black stools.  No recent change in bowel habits.   GU:  No history of incontinence.   MUSCLOSKELETAL: No history of joint pain or swelling.  No myalgias.   SKIN: Negative for lesions, rash, and itching.   ENDOCRINE: Negative for cold or heat intolerance, polydipsia or goiter.   PSYCH:  No depression or anxiety symptoms.   NEURO: As Above.   Vital Signs:  BP 104/62 mmHg  Pulse 91  Resp 16  Ht 6\' 1"  (1.854 m)  Wt 175 lb (79.379 kg)  BMI 23.09 kg/m2  SpO2 97%  Neurological Exam: MENTAL STATUS:  Alert, awake, and easily engages in conversation.  Good eye contact.  Well groomed and dressed. Oriented x 6. Speech is not dysarthric, but there  is intermittent stuttering of speech (old).  Montreal Cognitive Assessment  06/17/2014  Visuospatial/ Executive (0/5) 2  Naming (0/3) 2  Attention: Read list of digits (0/2) 2  Attention: Read list of letters (0/1) 1  Attention: Serial 7 subtraction starting at 100 (0/3) 1  Language: Repeat phrase (0/2) 2  Language : Fluency (0/1) 0  Abstraction (0/2) 1  Delayed Recall (0/5) 0  Orientation (0/6) 6  Total 17  Adjusted Score (based on education) 17    CRANIAL NERVES: Pupils round and reactive to light, mild asymmetry with OD 3.5 and OS 35mm.  Normal conjugate, extra-ocular eye movements in all directions of gaze.  No ptosis. Normal facial sensation.  Face is symmetric. Palate elevates symmetrically.  Tongue is midline.  MOTOR:  Motor strength is 5/5 in all extremities, except  mild intrinsic hand weakness bilaterally.  No pronator drift. Several Dupuytren's contractures noted on the hands bilaterally with previous surgical scars  MSRs:  Reflexes are 1+/4 in the upper extremities, absent reflexes in the lower extremities.  Plantars are down-going.  SENSORY:  Intact to light touch and vibration throughout.  COORDINATION/GAIT:  Normal finger-to- nose-finger Gait slightly wide-based, stable.   Data: US carotids 12/30/2012: Findings suggest 1-39% internal carotid artery stenosis bilaterally. Vertebral arteries are patent with antegrade flow.   Echo 12/30/2012:  - Left ventricle: The cavity size was normal. Systolic function was normal. The estimated ejection fraction was in the range of 60% to 65%. Wall motion was normal; there were no regional wall motion abnormalities. - Mitral valve: Calcified annulus. Mildly thickened leaflets . Mild posterior leafletprolapse.   MRI/A brain 12/29/2012:  1. Ventricular prominence is disproportionate to the degree of atrophy, suggesting normal pressure hydrocephalus.  2. No other acute intracranial abnormality. Specifically, there is no evidence for  acute or subacute infarct.  3. Mild distal small vessel disease is evident on the MRA without significant proximal stenosis, aneurysm, or branch vessel occlusion.   48-hr holter:  NSR, rare PVCs  Lab Results  Component Value Date   HGBA1C 5.8* 12/30/2012   Lab Results  Component Value Date   CHOL 190 12/30/2012   HDL 32* 12/30/2012   LDLCALC 102* 12/30/2012   TRIG 281* 12/30/2012   CHOLHDL 5.9 12/30/2012   CT head 06/04/2014: Ventricular prominence similar to prior MR suggesting normal pressure hydrocephalus. No intracranial hemorrhage. No CT evidence of large acute infarct. No intracranial mass or abnormal enhancement.  CTA head and neck 06/04/2014:   Calcified plaque with mild to slightly moderate narrowing cavernous segment internal carotid artery bilaterally. Mild narrowing and irregularity M1 segment left middle cerebral artery. Mild narrowing distal left vertebral artery.   IMPRESSION: Mr. Cephas is a 74 year-old gentleman returning with new complaints of memory impairment.  He unfortunately has had a difficult start to this year and was diagnosed with squamous cell carcinoma of the tongue in March and underwent chemotherapy and radiation from April - May. During the last few weeks of his treatment, he was experiencing pain and was given fentanyl patch and morphine.  Also during this time, he stopped his Adderal.  He was hospitalized on 5/24 for acute mental status change which was thought to be mostly be due to his medication.  The thought of Wernicke's encephalopathy was raised but his last alcoholic beverage was in April.  Imaging shows ventricular enlargement which was previously seen.  He slowly improved once narcotic medication was tapered and was discharged to rehab facility.  Over the past week, family have noted a huge improvement in his behavior and cognition, but he still has problems with attention and focusing.  He did not perform very well on his cognitive testing  today (17/30) with deficits in all domains except for orientation.  I do expect his cognitive impairment to improve although he may not return to his baseline functioning.  Repeat MOCA testing will be done at his next visit to assess for changes, if worsening or no improvement proceed with neuropsychiatric testing and/or MRI brain.  In my opinion, he should be safe to return home with 24-hr supervision.  I have recommended he start cognitive training therapy and continue physical therapy for his gait.  Fall precautions and using a rollator was recommended.  Patient was instructed not to drive.   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  We discussed diagnosis of normal pressure hydrocephalus including lumbar puncture for opening pressure, but I do not feel strongly about doing this.  He needs time to recover from his most recent hospitalization.  Regarding his dilated right pupil, it is only very mildly asymmetrical today (3.32mm OD, 53mm OS) both reactive to light and accomodation.  He reports to having a scopolamine patch behind his right ear, although it is unclear whether he was wearing one during the time this occurred.  With it's anticholinergic effects, if he was to accidentally rub his eye, this may cause pupillary dilatation.  There is no aneurysm or other structural lesion affecting CN III.  I had previously seen Mr. Dunnam for cryptogenic TIA while on aspirin 81 manifesting with left facial droop on 12/29/2012.  He was switched to plavix 75mg  and started on lipitor 10mg  for secondary stroke prevention.  Return to clinic in 3-4 months  The duration of this appointment visit was 60 minutes of face-to-face time with the patient.  Greater than 50% of this time was spent in counseling, explanation of diagnosis, planning of further management, and coordination of care.   Thank you for allowing me to participate in patient's care.  If I can  answer any additional questions, I would be pleased to do so.    Sincerely,    Donika K. Posey Pronto, DO

## 2014-06-17 NOTE — Progress Notes (Signed)
[facility] OFFICE PROGRESS NOTE  Patient Care Team: Seward Carol, MD as PCP - General (Internal Medicine) Leota Sauers, RN as Oncology Nurse Navigator Eppie Gibson, MD as Attending Physician (Radiation Oncology) Heath Lark, MD as Consulting Physician (Hematology and Oncology) Karie Mainland, RD as Dietitian (Nutrition)  SUMMARY OF ONCOLOGIC HISTORY:   Malignant neoplasm of base of tongue   03/03/2014 Imaging Ct neck elsewhere showed base of tongue mass crossing midline, bilateral LN enlargement, great >4 cm   03/05/2014 Procedure He has FNA biopsy of LN   03/05/2014 Pathology Results NZA [id number] biopsy confirmed squmaous cell carcinoma HPV positive   03/18/2014 Imaging PET/Ct scan showed tongue mass, bilateral LN   04/02/2014 Procedure He has placement of feeding tube and port   04/06/2014 - 05/25/2014 Radiation Therapy Rec'd RT to base of tongue and bilateral neck:  70 Gy in 35 fractions to gross disease, 63 Gy in 35 fractions to high risk nodal echelons, and 56 Gy in 35 fractions to intermediate risk nodal echelons.    04/07/2014 - 05/05/2014 Chemotherapy He received 2 doses of high dose cisplatin. He could not received a third dose due to profound side effects.   04/28/2014 Adverse Reaction Cycle 2 chemotherapy is delayed due to neutropenia   05/25/2014 - 06/03/2014 Hospital Admission He was admitted to the hospital for management of acute delirium.    INTERVAL HISTORY: Please see below for problem oriented charting. He is seen for further follow-up. Overall, he is improved significantly. He is allowed to eat ice chips at skilled nursing facility. Denies recent recurrent pneumonia/aspiration He is participating in rehabilitation. Delirium is resolved but he is experiencing profound short-term memory loss. He had recent weight loss due to inconsistent feeding.  REVIEW OF SYSTEMS:   Constitutional: Denies fevers, chills  Eyes: Denies blurriness of vision Ears, nose, mouth, throat,  and face: Denies mucositis or sore throat Respiratory: Denies cough, dyspnea or wheezes Cardiovascular: Denies palpitation, chest discomfort or lower extremity swelling Gastrointestinal:  Denies nausea, heartburn or change in bowel habits Skin: Denies abnormal skin rashes Lymphatics: Denies new lymphadenopathy or easy bruising Neurological:Denies numbness, tingling or new weaknesses All other systems were reviewed with the patient and are negative.  I have reviewed the past medical history, past surgical history, social history and family history with the patient and they are unchanged from previous note.  ALLERGIES:  has No Known Allergies.  MEDICATIONS:  Current Outpatient Prescriptions  Medication Sig Dispense Refill  . Amino Acids-Protein Hydrolys (FEEDING SUPPLEMENT, PRO-STAT SUGAR FREE 64,) LIQD Place 30 mLs into feeding tube 3 (three) times daily. 900 mL 0  . amphetamine-dextroamphetamine (ADDERALL) 20 MG tablet Place 1 tablet (20 mg total) into feeding tube 2 (two) times daily. 60 tablet 0  . antiseptic oral rinse (CPC / CETYLPYRIDINIUM CHLORIDE 0.05%) 0.05 % LIQD solution 7 mLs by Mouth Rinse route 2 times daily at 12 noon and 4 pm. 946 mL 0  . aspirin 81 MG tablet Place 1 tablet (81 mg total) into feeding tube daily. 30 tablet 0  . atorvastatin (LIPITOR) 20 MG tablet Place 1 tablet (20 mg total) into feeding tube daily at 6 PM. 30 tablet 0  . chlorhexidine (PERIDEX) 0.12 % solution 15 mLs by Mouth Rinse route 2 (two) times daily. 120 mL 0  . clopidogrel (PLAVIX) 75 MG tablet Place 1 tablet (75 mg total) into feeding tube daily. 30 tablet 0  . levothyroxine (SYNTHROID, LEVOTHROID) 75 MCG tablet Place 1 tablet (75 mcg  total) into feeding tube daily before breakfast. 30 tablet 0  . Morphine Sulfate (MORPHINE CONCENTRATE) 10 MG/0.5ML SOLN concentrated solution Place 0.5 mLs (10 mg total) into feeding tube every 2 (two) hours as needed for moderate pain, severe pain or shortness of  breath. 30 mL 0  . Nutritional Supplements (FEEDING SUPPLEMENT, GLUCERNA 1.2 CAL,) LIQD Place 474 mLs into feeding tube 3 (three) times daily. Use 174mL free water before and after each tube feed 1500 mL   . ondansetron (ZOFRAN) 8 MG tablet Place 1 tablet (8 mg total) into feeding tube every 8 (eight) hours as needed for nausea or vomiting. 30 tablet 1  . thiamine (VITAMIN B-1) 100 MG tablet Place 1 tablet (100 mg total) into feeding tube daily. Take 1 tablet TID for one week, then 1 tablet daily thereafter 30 tablet 0   No current facility-administered medications for this visit.    PHYSICAL EXAMINATION: ECOG PERFORMANCE STATUS: 1 - Symptomatic but completely ambulatory  Filed Vitals:   06/17/14 1118  BP: 124/67  Pulse: 92  Temp: 97.8 F (36.6 C)  Resp: 18   Filed Weights   06/17/14 1118  Weight: 175 lb 4.8 oz (79.516 kg)    GENERAL:alert, no distress and comfortable SKIN: skin color, texture, turgor are normal, no rashes or significant lesions EYES: normal, Conjunctiva are pink and non-injected, sclera clear OROPHARYNX:no exudate, no erythema and lips, buccal mucosa, and tongue normal . Dry mucous membrane is noted NECK: supple, thyroid normal size, non-tender, without nodularity LYMPH:  Persistent palpable lymphadenopathy in the left side of the neck  LUNGS: clear to auscultation and percussion with normal breathing effort HEART: regular rate & rhythm and no murmurs and no lower extremity edema ABDOMEN:abdomen soft, non-tender and normal bowel sounds. Feeding tube site looks okay Musculoskeletal:no cyanosis of digits and no clubbing  NEURO: alert & oriented x 3 with fluent speech, no focal motor/sensory deficits. He has poor hearing  LABORATORY DATA:  I have reviewed the data as listed    Component Value Date/Time   NA 132* 06/04/2014 1352   NA 136 05/24/2014 1541   K 4.6 06/04/2014 1352   K 4.8 05/24/2014 1541   CL 96* 06/04/2014 1352   CO2 26 06/04/2014 1352   CO2  24 05/24/2014 1541   GLUCOSE 111* 06/04/2014 1352   GLUCOSE 122 05/24/2014 1541   BUN 33* 06/04/2014 1352   BUN 25.9 05/24/2014 1541   CREATININE 1.01 06/04/2014 1352   CREATININE 0.9 05/24/2014 1541   CALCIUM 9.5 06/04/2014 1352   CALCIUM 8.9 05/24/2014 1541   PROT 7.0 06/04/2014 1352   PROT 6.8 05/24/2014 1541   ALBUMIN 3.0* 06/04/2014 1352   ALBUMIN 2.9* 05/24/2014 1541   AST 61* 06/04/2014 1352   AST 27 05/24/2014 1541   ALT 93* 06/04/2014 1352   ALT 38 05/24/2014 1541   ALKPHOS 109 06/04/2014 1352   ALKPHOS 95 05/24/2014 1541   BILITOT 0.4 06/04/2014 1352   BILITOT 0.33 05/24/2014 1541   GFRNONAA >60 06/04/2014 1352   GFRAA >60 06/04/2014 1352    No results found for: SPEP, UPEP  Lab Results  Component Value Date   WBC 10.3 06/04/2014   NEUTROABS 9.0* 06/04/2014   HGB 10.3* 06/04/2014   HCT 30.7* 06/04/2014   MCV 85.3 06/04/2014   PLT 481* 06/04/2014      Chemistry      Component Value Date/Time   NA 132* 06/04/2014 1352   NA 136 05/24/2014 1541   K 4.6  06/04/2014 1352   K 4.8 05/24/2014 1541   CL 96* 06/04/2014 1352   CO2 26 06/04/2014 1352   CO2 24 05/24/2014 1541   BUN 33* 06/04/2014 1352   BUN 25.9 05/24/2014 1541   CREATININE 1.01 06/04/2014 1352   CREATININE 0.9 05/24/2014 1541      Component Value Date/Time   CALCIUM 9.5 06/04/2014 1352   CALCIUM 8.9 05/24/2014 1541   ALKPHOS 109 06/04/2014 1352   ALKPHOS 95 05/24/2014 1541   AST 61* 06/04/2014 1352   AST 27 05/24/2014 1541   ALT 93* 06/04/2014 1352   ALT 38 05/24/2014 1541   BILITOT 0.4 06/04/2014 1352   BILITOT 0.33 05/24/2014 1541     ASSESSMENT & PLAN:  Malignant neoplasm of base of tongue The patient tolerated treatment poorly. He has persistent palpable mass on clinical exam. He has PET CT scan scheduled for next month to assess response to treatment. In the meantime, I will continue supportive care visits  Dysphagia The patient has dysphagia likely related to recent delirium  and treatment. He has swallow evaluation scheduled for today. If he pass swallow evaluation, hopefully he can start taking an oral diet. His mucositis has resolved.  Weight loss He has significant weight loss related to recent dysphagia and delirium. Once he is discharged, I recommend he resume minimum 6 cans of nutritional supplement at home if possible. If he passed swallow evaluation, hopefully he can start swallowing soft diet.  Drug-induced memory loss Family members noted a significant short-term memory loss. I think this is multifactorial, related to recent chemotherapy, delirium and poor hearing. I recommend close follow-up with neurologist. I recommend a few other coping strategies such as writing down information and going on the normal routine at home once he is discharged from skilled nursing facility.   No orders of the defined types were placed in this encounter.   All questions were answered. The patient knows to call the clinic with any problems, questions or concerns. No barriers to learning was detected. I spent 25 minutes counseling the patient face to face. The total time spent in the appointment was 30 minutes and more than 50% was on counseling and review of test results     [facility], [place], MD 06/17/2014 12:41 PM

## 2014-06-17 NOTE — Patient Instructions (Addendum)
1.  Behavior therapy referral 2.  Recommend using a walker  3.  Return to clinic in 4 months

## 2014-06-17 NOTE — Assessment & Plan Note (Signed)
Family members noted a significant short-term memory loss. I think this is multifactorial, related to recent chemotherapy, delirium and poor hearing. I recommend close follow-up with neurologist. I recommend a few other coping strategies such as writing down information and going on the normal routine at home once he is discharged from skilled nursing facility.

## 2014-06-17 NOTE — Telephone Encounter (Signed)
Gave adn pritned appt sched and avs for pt for June

## 2014-06-18 ENCOUNTER — Ambulatory Visit: Payer: Self-pay | Admitting: Neurology

## 2014-06-18 ENCOUNTER — Non-Acute Institutional Stay: Payer: Medicare Other | Admitting: Adult Health

## 2014-06-18 ENCOUNTER — Other Ambulatory Visit: Payer: Self-pay | Admitting: *Deleted

## 2014-06-18 DIAGNOSIS — G9389 Other specified disorders of brain: Secondary | ICD-10-CM

## 2014-06-18 DIAGNOSIS — E46 Unspecified protein-calorie malnutrition: Secondary | ICD-10-CM

## 2014-06-18 DIAGNOSIS — C01 Malignant neoplasm of base of tongue: Secondary | ICD-10-CM | POA: Diagnosis not present

## 2014-06-18 NOTE — Progress Notes (Signed)
Note sent

## 2014-06-18 NOTE — Progress Notes (Signed)
   06/17/14 1300  SLP G-Codes **NOT FOR INPATIENT CLASS**  Functional Assessment Tool Used (clinical judgement)  Functional Limitations Swallowing  Swallow Current Status (P9558) CJ  Swallow Goal Status (P1674) CJ  Swallow Discharge Status (A5525) CJ  SLP Evaluations  $ SLP Speech Visit 1 Procedure

## 2014-06-22 DIAGNOSIS — F419 Anxiety disorder, unspecified: Secondary | ICD-10-CM | POA: Diagnosis not present

## 2014-06-22 DIAGNOSIS — C760 Malignant neoplasm of head, face and neck: Secondary | ICD-10-CM | POA: Diagnosis not present

## 2014-06-22 DIAGNOSIS — G459 Transient cerebral ischemic attack, unspecified: Secondary | ICD-10-CM | POA: Diagnosis not present

## 2014-06-22 DIAGNOSIS — E039 Hypothyroidism, unspecified: Secondary | ICD-10-CM | POA: Diagnosis not present

## 2014-06-22 DIAGNOSIS — F908 Attention-deficit hyperactivity disorder, other type: Secondary | ICD-10-CM | POA: Diagnosis not present

## 2014-06-22 DIAGNOSIS — R221 Localized swelling, mass and lump, neck: Secondary | ICD-10-CM | POA: Diagnosis not present

## 2014-06-24 ENCOUNTER — Other Ambulatory Visit: Payer: Self-pay | Admitting: *Deleted

## 2014-06-28 ENCOUNTER — Other Ambulatory Visit (HOSPITAL_COMMUNITY): Payer: Self-pay | Admitting: Dentistry

## 2014-06-28 ENCOUNTER — Ambulatory Visit (HOSPITAL_COMMUNITY): Payer: Self-pay | Admitting: Dentistry

## 2014-06-28 ENCOUNTER — Other Ambulatory Visit: Payer: Self-pay

## 2014-06-28 ENCOUNTER — Encounter (HOSPITAL_COMMUNITY): Payer: Self-pay | Admitting: Dentistry

## 2014-06-28 ENCOUNTER — Other Ambulatory Visit: Payer: Self-pay | Admitting: *Deleted

## 2014-06-28 VITALS — BP 109/64 | HR 84 | Temp 97.6°F | Wt 175.0 lb

## 2014-06-28 DIAGNOSIS — Z9221 Personal history of antineoplastic chemotherapy: Secondary | ICD-10-CM

## 2014-06-28 DIAGNOSIS — C01 Malignant neoplasm of base of tongue: Secondary | ICD-10-CM | POA: Diagnosis not present

## 2014-06-28 DIAGNOSIS — R1310 Dysphagia, unspecified: Secondary | ICD-10-CM

## 2014-06-28 DIAGNOSIS — Z923 Personal history of irradiation: Secondary | ICD-10-CM

## 2014-06-28 DIAGNOSIS — Z5189 Encounter for other specified aftercare: Secondary | ICD-10-CM | POA: Diagnosis not present

## 2014-06-28 DIAGNOSIS — R682 Dry mouth, unspecified: Secondary | ICD-10-CM

## 2014-06-28 DIAGNOSIS — K117 Disturbances of salivary secretion: Secondary | ICD-10-CM

## 2014-06-28 DIAGNOSIS — K029 Dental caries, unspecified: Secondary | ICD-10-CM

## 2014-06-28 DIAGNOSIS — R432 Parageusia: Secondary | ICD-10-CM

## 2014-06-28 MED ORDER — SODIUM FLUORIDE 1.1 % DT CREA: TOPICAL_CREAM | DENTAL | Status: DC

## 2014-06-28 NOTE — Patient Instructions (Addendum)
RECOMMENDATIONS: 1. Brush after meals and at bedtime.  Use fluoride at bedtime. 2. Use trismus exercises as directed. 3. Use Biotene Rinse or salt water/baking soda rinses. 4. Multiple sips of water as needed. 5. Return to Dr. Gloriann Loan for follow up dental care in last August of 2016 for exam and cleaning and evaluation for restoration on #13 if not previously done.  Lenn Cal, DDS   RADIATION THERAPY AND DECISIONS REGARDING YOUR TEETH  Xerostomia (dry mouth) Your salivary glands may be in the filed of radiation.  Radiation may include all or part of your saliva glands.  This will cause your saliva to dry up and you will have a dry mouth.  The dry mouth will be for the rest of your life unless your radiation oncologist tells you otherwise.  Your saliva has many functions:  Saliva wets your tongue for speaking.  It coats your teeth and the inside of your mouth for easier movement.  It helps with chewing and swallowing food.  It helps clean away harmful acid and toxic products made by the germs in your mouth, therefore it helps prevent cavities.  It kills some germs in your mouth and helps to prevent gum disease.  It helps to carry flavor to your taste buds.  Once you have lost your saliva you will be at higher risk for tooth decay and gum disease.  What can be done to help improve your mouth when there's not enough saliva:  1.  Your dentist may give a prescription for Salagen.  It will not bring back all of your saliva but may bring back some of it.  Also your saliva may be thick and ropy or white and foamy. It will not feel like it use to feel.  2.  You will need to swish with water every time your mouth feels dry.  YOU CANNOT suck on any cough drops, mints, lemon drops, candy, vitamin C or any other products.  You cannot use anything other than water to make your mouth feel less dry.  If you want to drink anything else you have to drink it all at once and brush afterwards.  Be  sure to discuss the details of your diet habits with your dentist or hygienist.  Radiation caries: This is decay that happens very quickly once your mouth is very dry due to radiation therapy.  Normally cavities take six months to two years to become a problem.  When you have dry mouth cavities may take as little as eight weeks to cause you a problem.  This is why dental check ups every two months are necessary as long as you have a dry mouth. Radiation caries typically, but not always, start at your gum line where it is hard to see the cavity.  It is therefore also hard to fill these cavities adequately.  This high rate of cavities happens because your mouth no longer has saliva and therefore the acid made by the germs starts the decay process.  Whenever you eat anything the germs in your mouth change the food into acid.  The acid then burns a small hole in your tooth.  This small hole is the beginning of a cavity.  If this is not treated then it will grow bigger and become a cavity.  The way to avoid this hole getting bigger is to use fluoride every evening as prescribed by your dentist.  You have to make sure that your teeth are very clean before  you use the fluoride.  This fluoride in turn will strengthen your teeth and prepare them for another day of fighting acid.  If you develop radiation caries many times the damage is so large that you will have to have all your teeth removed.  This could be a big problem if some of these teeth are in the field of radiation.  Further details of why this could be a big problem will follow.  (See Osteoradionecrosis).  Loss of taste (dysgeusia) This happens to varying degrees once you've had radiation therapy to your jaw region.  Many times taste is not completely lost but becomes limited.  The loss of taste is mostly due to radiation affecting your taste buds.  However if you have no saliva in your mouth to carry the flavor to your taste buds it would be difficult for  your taste buds to taste anything.  That is why using water or a prescription for Salagen prior to meals and during meals may help with some of the taste.  Keep in mind that taste generally returns very slowly over the course of several months or several years after radiation therapy.  Don't give up hope.  Trismus According to your Radiation Oncologist your TMJ or jaw joints are going to be partially or fully in the field of radiation.  This means that over time the muscles that help you open and close your mouth may get stiff.  This will potentially result in your not being able to open your mouth wide enough or as wide as you can open it now.  Le me give you an example of how slowly this happens and how unaware people are of it.  A gentlemen that had radiation therapy two years ago came back to me complaining that bananas are just too large for him to be able to fit them in between his teeth.  He was not able to open wide enough to bite into a banana.  This happens slowly and over a period of time.  What do we do to try and prevent this?  Your dentist will probably give you a stack of sticks called a trismus exercise device .  This stack will help your remind your muscles and your jaw joint to open up to the same distance every day.  Use these sticks every morning when you wake up according to the instructions given by the dentist.   You must use these sticks for at least one to two years after radiation therapy.  The reason for that is because it happens so slowly and keeps going on for about two years after radiation therapy.  Your hospital dentist will help you monitor your mouth opening and make sure that it's not getting smaller.  Osteoradionecrosis (ORN) This is a condition where your jaw bone after having had radiation therapy becomes very dry.  It has very little blood supply to keep it alive.  If you develop a cavity that turns into an abscess or an infection then the jaw bone does not have enough  blood supply to help fight the infection.  At this point it is very likely that the infection could cause the death of your jaw bone.  When you have dead bone it has to be removed.  Therefore you might end up having to have surgery to remove part of your jaw bone, the part of the jaw bone that has been affected.   Healing is also a problem if you are  to have surgery in the areas where the bone has had radiation therapy.  The same reasons apply.  If you have surgery you need more blood supply which is not available.  When blood supply and oxygen are not available again, there is a chance for the bone to die.  Occasionally ORN happens on its own with no obvious reason.  This is quite rare.  We believe that patients who continue to smoke and/or drink alcohol have a higher chance of having this bone problem.  Therefore once your jaw bone has had radiation therapy if there are any teeth in that area, you should never have them pulled.  You should also never have any surgery on your teeth or gums in that area unless the oral surgeon or Periodontist is aware of your history of radiation. There is some expensive management techniques that might be used to limit your risks.  The risks for ORN either from infection or spontaneous ( or on it's own) are life long.    TRISMUS  Trismus is a condition where the jaw does not allow the mouth to open as wide as it usually does.  This can happen almost suddenly, or in other cases the process is so slow, it is hard to notice it-until it is too far along.  When the jaw joints and/or muscles have been exposed to radiation treatments, the onset of Trismus is very slow.  This is because the muscles are losing their stretching ability over a long period of time, as long as 2 YEARS after the end of radiation.  It is therefore important to exercise these muscles and joints.  TRISMUS EXERCISES   Stack of tongue depressors measuring the same or a little less than the last  documented MIO (Maximum Interincisal Opening).  Secure them with a rubber band on both ends.  Place the stack in the patient's mouth, supporting the other end.  Allow 30 seconds for muscle stretching.  Rest for a few seconds.  Repeat 3-5 times  For all radiation patients, this exercise is recommended in the mornings and evenings unless otherwise instructed.  The exercise should be done for a period of 2 YEARS after the end of radiation.  MIO should be checked routinely on recall dental visits by the general dentist or the hospital dentist.  The patient is advised to report any changes, soreness, or difficulties encountered when doing the exercises.  FLUORIDE TRAYS PATIENT INSTRUCTIONS    Obtain prescription from the pharmacy.  Don't be surprised if it needs to be ordered.  Be sure to let the pharmacy know when you are close to needing a new refill for them to have it ready for you without interruption of Fluoride use.  The best time to use your Fluoride is before bed time.  You must brush your teeth very well and floss before using the Fluoride in order to get the best use out of the Fluoride treatments.  Place 1 drop of Fluoride gel per tooth in the tray.  Place the tray on your lower teeth and your upper teeth.  Make sure the trays are seated all the way.  Remember, they only fit one way on your teeth.  Insert for 5 full minutes.  At the end of the 5 minutes, take the trays out.  SPIT OUT excess. .  Do NOT rinse your mouth!  Do NOT eat or drink after treatments for at least 30 minutes.  This is why the best time for  your treatments is before bedtime.  Clean the inside of your Fluoride trays using COLD WATER and a toothbrush.  In order to keep your Trays from discoloring and free from odors, soak them overnight in denture cleaners such as Efferdent.  Do not use bleach or non denture products.  Store the trays in a safe dry place AWAY from any heat until your next  treatment.  If anything happens to your Fluoride trays, or they don't fit as well after any dental work, please let us know as soon as possible.

## 2014-06-28 NOTE — Progress Notes (Signed)
06/28/2014  Patient Name:   Dylan Moreno Date of Birth:   [birth year] Medical Record Number: [id number]  BP 109/64 mmHg  Pulse 84  Temp(Src) 97.6 F (36.4 C) (Oral)  Wt 175 lb (79.379 kg)  Florene Route Johann presents for oral examination after chemoradiation therapy. Patient has completed radiation treatments from 04/06/2014 through 05/25/2014. Patient received 2 cycles of cisplatin chemotherapy from 04/06/2014 through 05/05/2014.  REVIEW OF CHIEF COMPLAINTS:  DRY MOUTH: Yes HARD TO SWALLOW: Yes  HURT TO SWALLOW: No TASTE CHANGES: Taste is returning slowly. SORES IN MOUTH: No TRISMUS: No problems with trismus symptoms WEIGHT: 175 pounds down from initial 189 pounds.  HOME OH REGIMEN:  BRUSHING: Twice a day FLOSSING: No RINSING: Using salt water and baking soda rinses. FLUORIDE: Restarting to use fluoride and fluoride trays recently. TRISMUS EXERCISES:  Maximum interincisal opening: 48 mm   DENTAL EXAM:  Oral Hygiene:(PLAQUE): Good oral hygiene, minimal plaque. LOCATION OF MUCOSITIS: None noted DESCRIPTION OF SALIVA: Decreased and foamy saliva. Mild xerostomia. ANY EXPOSED BONE: None noted OTHER WATCHED AREAS: Tooth #13 with need for dental restoration as indicated. DX: Xerostomia, Dysgeusia, Dysphagia and caries #13  RECOMMENDATIONS: 1. Brush after meals and at bedtime.  Use fluoride at bedtime. 2. Use trismus exercises as directed. 3. Use Biotene Rinse or salt water/baking soda rinses. 4. Multiple sips of water as needed. 5. Return to Dr. Gloriann Loan for follow up dental care in last August of 2016 for exam and cleaning and evaluation for restoration on #13 if not previously done.  Lenn Cal, DDS

## 2014-06-29 ENCOUNTER — Telehealth: Payer: Self-pay | Admitting: *Deleted

## 2014-06-29 NOTE — Telephone Encounter (Signed)
Behavior therapy referral faxed.

## 2014-06-29 NOTE — Telephone Encounter (Signed)
Is this for behavioral health or neuropsych testing?  Called patient's wife to find out what is written on AVS since the last note says neuropsych testing.

## 2014-06-29 NOTE — Telephone Encounter (Signed)
Behavioral therapy, not neuropsych (hold on this for now).  Thanks.

## 2014-06-29 NOTE — Telephone Encounter (Signed)
Dylan Moreno, any updates on this?

## 2014-06-29 NOTE — Telephone Encounter (Signed)
Patients wife called to check on the status of a referral to behavior health.  Call back number [phone] Dylan Moreno)

## 2014-06-30 NOTE — Telephone Encounter (Signed)
I spoke with patient's wife and informed her that the referral was sent.

## 2014-07-01 ENCOUNTER — Encounter: Payer: Self-pay | Admitting: Hematology and Oncology

## 2014-07-01 ENCOUNTER — Ambulatory Visit (HOSPITAL_BASED_OUTPATIENT_CLINIC_OR_DEPARTMENT_OTHER): Payer: Medicare Other

## 2014-07-01 ENCOUNTER — Telehealth: Payer: Self-pay | Admitting: Hematology and Oncology

## 2014-07-01 ENCOUNTER — Encounter: Payer: Self-pay | Admitting: *Deleted

## 2014-07-01 ENCOUNTER — Ambulatory Visit (HOSPITAL_BASED_OUTPATIENT_CLINIC_OR_DEPARTMENT_OTHER): Payer: Medicare Other | Admitting: Hematology and Oncology

## 2014-07-01 ENCOUNTER — Other Ambulatory Visit: Payer: Self-pay | Admitting: *Deleted

## 2014-07-01 ENCOUNTER — Other Ambulatory Visit (HOSPITAL_BASED_OUTPATIENT_CLINIC_OR_DEPARTMENT_OTHER): Payer: Medicare Other

## 2014-07-01 VITALS — BP 128/69 | HR 85 | Temp 98.1°F | Resp 19 | Ht 73.0 in | Wt 174.2 lb

## 2014-07-01 DIAGNOSIS — E860 Dehydration: Secondary | ICD-10-CM

## 2014-07-01 DIAGNOSIS — D630 Anemia in neoplastic disease: Secondary | ICD-10-CM | POA: Diagnosis not present

## 2014-07-01 DIAGNOSIS — C01 Malignant neoplasm of base of tongue: Secondary | ICD-10-CM

## 2014-07-01 DIAGNOSIS — R1310 Dysphagia, unspecified: Secondary | ICD-10-CM

## 2014-07-01 DIAGNOSIS — Z95828 Presence of other vascular implants and grafts: Secondary | ICD-10-CM

## 2014-07-01 LAB — CBC WITH DIFFERENTIAL/PLATELET
BASO%: 0.2 % (ref 0.0–2.0)
BASOS ABS: 0 10*3/uL (ref 0.0–0.1)
EOS%: 1 % (ref 0.0–7.0)
Eosinophils Absolute: 0 10*3/uL (ref 0.0–0.5)
HCT: 35.3 % — ABNORMAL LOW (ref 38.4–49.9)
HGB: 11.4 g/dL — ABNORMAL LOW (ref 13.0–17.1)
LYMPH%: 6.8 % — ABNORMAL LOW (ref 14.0–49.0)
MCH: 29.6 pg (ref 27.2–33.4)
MCHC: 32.3 g/dL (ref 32.0–36.0)
MCV: 91.7 fL (ref 79.3–98.0)
MONO#: 0.4 10*3/uL (ref 0.1–0.9)
MONO%: 8.7 % (ref 0.0–14.0)
NEUT%: 83.3 % — ABNORMAL HIGH (ref 39.0–75.0)
NEUTROS ABS: 3.4 10*3/uL (ref 1.5–6.5)
PLATELETS: 224 10*3/uL (ref 140–400)
RBC: 3.85 10*6/uL — AB (ref 4.20–5.82)
RDW: 16 % — ABNORMAL HIGH (ref 11.0–14.6)
WBC: 4.1 10*3/uL (ref 4.0–10.3)
lymph#: 0.3 10*3/uL — ABNORMAL LOW (ref 0.9–3.3)

## 2014-07-01 LAB — COMPREHENSIVE METABOLIC PANEL (CC13)
ALK PHOS: 89 U/L (ref 40–150)
ALT: 23 U/L (ref 0–55)
ANION GAP: 8 meq/L (ref 3–11)
AST: 20 U/L (ref 5–34)
Albumin: 3.7 g/dL (ref 3.5–5.0)
BILIRUBIN TOTAL: 0.9 mg/dL (ref 0.20–1.20)
BUN: 30.1 mg/dL — ABNORMAL HIGH (ref 7.0–26.0)
CALCIUM: 10.3 mg/dL (ref 8.4–10.4)
CO2: 27 mEq/L (ref 22–29)
CREATININE: 1 mg/dL (ref 0.7–1.3)
Chloride: 105 mEq/L (ref 98–109)
EGFR: 72 mL/min/{1.73_m2} — ABNORMAL LOW (ref >90)
GLUCOSE: 119 mg/dL (ref 70–140)
POTASSIUM: 4.4 meq/L (ref 3.5–5.1)
SODIUM: 140 meq/L (ref 136–145)
TOTAL PROTEIN: 6.9 g/dL (ref 6.4–8.3)

## 2014-07-01 LAB — MAGNESIUM (CC13): Magnesium: 2.5 mg/dl (ref 1.5–2.5)

## 2014-07-01 MED ORDER — HEPARIN SOD (PORK) LOCK FLUSH 100 UNIT/ML IV SOLN
500.0000 [IU] | Freq: Once | INTRAVENOUS | Status: AC
  Administered 2014-07-01: 500 [IU] via INTRAVENOUS
  Filled 2014-07-01: qty 5

## 2014-07-01 MED ORDER — SODIUM CHLORIDE 0.9 % IJ SOLN
10.0000 mL | INTRAMUSCULAR | Status: DC | PRN
  Administered 2014-07-01: 10 mL via INTRAVENOUS
  Filled 2014-07-01: qty 10

## 2014-07-01 NOTE — Assessment & Plan Note (Signed)
This is likely anemia of chronic disease. The patient denies recent history of bleeding such as epistaxis, hematuria or hematochezia. He is asymptomatic from the anemia. We will observe for now. It is improving

## 2014-07-01 NOTE — Assessment & Plan Note (Signed)
The patient tolerated treatment poorly. He has persistent palpable mass on clinical exam. He has PET CT scan scheduled for next month to assess response to treatment. In the meantime, I will continue supportive care visits I will get his PET to be presented at the next ENT tumor board

## 2014-07-01 NOTE — Progress Notes (Signed)
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    04/07/2014 - 05/05/2014 Chemotherapy He received 2 doses of high dose cisplatin. He could not received a third dose due to profound side effects.   04/28/2014 Adverse Reaction Cycle 2 chemotherapy is delayed due to neutropenia   05/25/2014 - 06/03/2014 Hospital Admission He was admitted to the hospital for management of acute delirium.    INTERVAL HISTORY: Please see below for problem oriented charting. He returns for follow-up He is doing well and advancing oral diet as tolerated. He is not drinking as much water as he should Had intermittent dysphagia. He is still using 5-6 cans of dietary supplements  REVIEW OF SYSTEMS:   Constitutional: Denies fevers, chills or abnormal weight loss Eyes: Denies blurriness of vision Ears, nose, mouth, throat, and face: Denies mucositis or sore throat Respiratory: Denies cough, dyspnea or wheezes Cardiovascular: Denies palpitation,  chest discomfort or lower extremity swelling Gastrointestinal:  Denies nausea, heartburn or change in bowel habits Skin: Denies abnormal skin rashes Lymphatics: Denies new lymphadenopathy or easy bruising Neurological:Denies numbness, tingling or new weaknesses Behavioral/Psych: Mood is stable, no new changes  All other systems were reviewed with the patient and are negative.  I have reviewed the past medical history, past surgical history, social history and family history with the patient and they are unchanged from previous note.  ALLERGIES:  has No Known Allergies.  MEDICATIONS:  Current Outpatient Prescriptions  Medication Sig Dispense Refill  . amphetamine-dextroamphetamine (ADDERALL) 20 MG tablet Place 1 tablet (20 mg total) into feeding tube 2 (two) times daily. (Patient taking differently: Take 20 mg by mouth 2 (two) times daily. ) 60 tablet 0  . atorvastatin (LIPITOR) 20 MG tablet Place 1 tablet (20 mg total) into feeding tube daily at 6 PM. (Patient taking differently: Take 20 mg by mouth daily at 6 PM. ) 30 tablet 0  . clopidogrel (PLAVIX) 75 MG tablet Place 1 tablet (75 mg total) into feeding tube daily. (Patient taking differently: Take 75 mg by mouth daily. ) 30 tablet 0  . levothyroxine (SYNTHROID, LEVOTHROID) 75 MCG tablet Place 1 tablet (75 mcg total) into feeding tube daily before breakfast. (Patient taking differently: Take 75 mcg by mouth daily before breakfast. ) 30 tablet 0  . Multiple Vitamin (MULTIVITAMIN) tablet Take 1 tablet by mouth daily.    . sodium fluoride (PREVIDENT 5000 PLUS) 1.1 % CREA dental cream Apply to tooth brush. Brush teeth for 2 minutes. Spit out excess-DO NOT swallow. Repeat nightly.  1 Tube prn  . thiamine 100 MG tablet Take 100 mg by mouth daily.      No current facility-administered medications for this visit.    PHYSICAL EXAMINATION: ECOG PERFORMANCE STATUS: 1 - Symptomatic but completely ambulatory  Filed Vitals:   07/01/14 1019  BP:  128/69  Pulse: 85  Temp: 98.1 F (36.7 C)  Resp: 19   Filed Weights   07/01/14 1019  Weight: 174 lb 3.2 oz (79.017 kg)    GENERAL:alert, no distress and comfortable SKIN: skin color, texture, turgor are normal, no rashes or significant lesions EYES: normal, Conjunctiva are pink and non-injected, sclera clear OROPHARYNX:He has dry mucous membrane. No thrush NECK: supple, thyroid normal size, non-tender, without nodularity LYMPH:  persistent palpable lymphadenopathy in the left cervical region LUNGS: clear to auscultation and percussion with normal breathing effort HEART: regular rate & rhythm and no murmurs and no lower extremity edema ABDOMEN:abdomen soft, non-tender and normal bowel sounds. Feeding tube site looks OK Musculoskeletal:no cyanosis of digits and no clubbing  NEURO: alert & oriented x 3 with fluent speech, no focal motor/sensory deficits  LABORATORY DATA:  I have reviewed the data as listed    Component Value Date/Time   NA 140 07/01/2014 0959   NA 132* 06/04/2014 1352   K 4.4 07/01/2014 0959   K 4.6 06/04/2014 1352   CL 96* 06/04/2014 1352   CO2 27 07/01/2014 0959   CO2 26 06/04/2014 1352   GLUCOSE 119 07/01/2014 0959   GLUCOSE 111* 06/04/2014 1352   BUN 30.1* 07/01/2014 0959   BUN 33* 06/04/2014 1352   CREATININE 1.0 07/01/2014 0959   CREATININE 1.01 06/04/2014 1352   CALCIUM 10.3 07/01/2014 0959   CALCIUM 9.5 06/04/2014 1352   PROT 6.9 07/01/2014 0959   PROT 7.0 06/04/2014 1352   ALBUMIN 3.7 07/01/2014 0959   ALBUMIN 3.0* 06/04/2014 1352   AST 20 07/01/2014 0959   AST 61* 06/04/2014 1352   ALT 23 07/01/2014 0959   ALT 93* 06/04/2014 1352   ALKPHOS 89 07/01/2014 0959   ALKPHOS 109 06/04/2014 1352   BILITOT 0.90 07/01/2014 0959   BILITOT 0.4 06/04/2014 1352   GFRNONAA >60 06/04/2014 1352   GFRAA >60 06/04/2014 1352    No results found for: SPEP, UPEP  Lab Results  Component Value Date   WBC 4.1 07/01/2014   NEUTROABS 3.4 07/01/2014   HGB  11.4* 07/01/2014   HCT 35.3* 07/01/2014   MCV 91.7 07/01/2014   PLT 224 07/01/2014      Chemistry      Component Value Date/Time   NA 140 07/01/2014 0959   NA 132* 06/04/2014 1352   K 4.4 07/01/2014 0959   K 4.6 06/04/2014 1352   CL 96* 06/04/2014 1352   CO2 27 07/01/2014 0959   CO2 26 06/04/2014 1352   BUN 30.1* 07/01/2014 0959   BUN 33* 06/04/2014 1352   CREATININE 1.0 07/01/2014 0959   CREATININE 1.01 06/04/2014 1352      Component Value Date/Time   CALCIUM 10.3 07/01/2014 0959   CALCIUM 9.5 06/04/2014 1352   ALKPHOS 89 07/01/2014 0959   ALKPHOS 109 06/04/2014 1352   AST 20 07/01/2014 0959   AST 61* 06/04/2014 1352   ALT 23 07/01/2014 0959   ALT 93* 06/04/2014 1352   BILITOT 0.90 07/01/2014 0959   BILITOT 0.4 06/04/2014 1352      ASSESSMENT & PLAN:  Malignant neoplasm of base of tongue The patient tolerated treatment poorly. He has persistent palpable  mass on clinical exam. He has PET CT scan scheduled for next month to assess response to treatment. In the meantime, I will continue supportive care visits I will get his PET to be presented at the next ENT tumor board  Anemia in neoplastic disease This is likely anemia of chronic disease. The patient denies recent history of bleeding such as epistaxis, hematuria or hematochezia. He is asymptomatic from the anemia. We will observe for now. It is improving   Dehydration He appears clinically dehydrated I recommend increase oral intake as tolerated  Dysphagia The patient has dysphagia likely related to recent delirium and treatment. He will continue close follow-up with speech and language therapist and will continue to advance diet as tolerated    No orders of the defined types were placed in this encounter.   All questions were answered. The patient knows to call the clinic with any problems, questions or concerns. No barriers to learning was detected. I spent 15 minutes counseling the patient face to face.  The total time spent in the appointment was 20 minutes and more than 50% was on counseling and review of test results     [facility], MD 07/01/2014 11:17 PM

## 2014-07-01 NOTE — Telephone Encounter (Signed)
Pt confirmed labs/ov per 06/30 POF, gave pt AVS and Calendar... KJ °

## 2014-07-01 NOTE — Telephone Encounter (Signed)
Per 06/30 POF, referral to [facility] and [place], sent msg to TF/KJ/TG to send information to [facility] so they can contact pt with D/T.... Cherylann Banas

## 2014-07-01 NOTE — Assessment & Plan Note (Signed)
He appears clinically dehydrated I recommend increase oral intake as tolerated

## 2014-07-01 NOTE — Patient Instructions (Signed)

## 2014-07-01 NOTE — Assessment & Plan Note (Signed)
The patient has dysphagia likely related to recent delirium and treatment. He will continue close follow-up with speech and language therapist and will continue to advance diet as tolerated

## 2014-07-02 ENCOUNTER — Telehealth: Payer: Self-pay | Admitting: Hematology and Oncology

## 2014-07-02 ENCOUNTER — Telehealth: Payer: Self-pay | Admitting: *Deleted

## 2014-07-02 DIAGNOSIS — C01 Malignant neoplasm of base of tongue: Secondary | ICD-10-CM

## 2014-07-02 NOTE — Telephone Encounter (Signed)
Elisa from [facility] called to clarify referral and request insurance and demographic information.   I informed Elisa of referral, pt's wife requested [facility],  and faxed over demographic and insurance info to their fax [phone].

## 2014-07-02 NOTE — Progress Notes (Signed)
Oncology Nurse Navigator Documentation  Oncology Nurse Navigator Flowsheets 07/01/2014  Navigator Encounter Type Clinic/MDC  Patient Visit Type Medonc  To provide support and encouragement, care continuity and to assess for needs, met with patient during follow-up appt with Dr. Alvy Bimler.  His [name] accompanied him.  I addressed his concerns about fatigue and dry mouth.  We discussed importance of exercise to build physical stamina.  We discussed importance of hydration, established goal of 64 oz fluid daily.  He agreed to my suggestion of keeping a diary of daily exercise and fluid intake.  He did not express any needs or concerns at this time.  I encouraged him to contact me if that changes, he verbalized understanding.   Treatment Phase -  Barriers/Navigation Needs -  Education -  Interventions -  Coordination of Care -  Time Spent with Patient [place], RN, BSN, [place] [facility] [place] [phone]

## 2014-07-02 NOTE — Telephone Encounter (Signed)
Faxed pt medical records to [facility] and [place] [phone]

## 2014-07-06 DIAGNOSIS — M6281 Muscle weakness (generalized): Secondary | ICD-10-CM | POA: Diagnosis not present

## 2014-07-06 DIAGNOSIS — R262 Difficulty in walking, not elsewhere classified: Secondary | ICD-10-CM | POA: Diagnosis not present

## 2014-07-07 DIAGNOSIS — M6281 Muscle weakness (generalized): Secondary | ICD-10-CM | POA: Diagnosis not present

## 2014-07-07 DIAGNOSIS — R262 Difficulty in walking, not elsewhere classified: Secondary | ICD-10-CM | POA: Diagnosis not present

## 2014-07-14 ENCOUNTER — Ambulatory Visit: Payer: Medicare Other | Attending: Radiation Oncology

## 2014-07-14 DIAGNOSIS — R1310 Dysphagia, unspecified: Secondary | ICD-10-CM | POA: Diagnosis not present

## 2014-07-15 ENCOUNTER — Telehealth: Payer: Self-pay | Admitting: *Deleted

## 2014-07-15 ENCOUNTER — Encounter: Payer: Self-pay | Admitting: Hematology and Oncology

## 2014-07-15 ENCOUNTER — Other Ambulatory Visit: Payer: Self-pay | Admitting: *Deleted

## 2014-07-15 ENCOUNTER — Other Ambulatory Visit: Payer: Self-pay | Admitting: Hematology and Oncology

## 2014-07-15 DIAGNOSIS — R300 Dysuria: Secondary | ICD-10-CM | POA: Insufficient documentation

## 2014-07-15 HISTORY — DX: Dysuria: R30.0

## 2014-07-15 NOTE — Therapy (Signed)
[facility] [address] [place], [place], [address] Phone: [phone]   Fax:  [phone]  Speech Language Pathology Treatment  Patient Details  Name: Dylan Moreno MRN: [id number] Date of Birth: 01/25/1940 Referring Provider:  Polite, Ronald, MD  Encounter Date: 07/14/2014      End of Session - 07/15/14 1341    Visit Number 4   Number of Visits 6   Date for SLP Re-Evaluation 09/13/14   SLP Start Time 1102   SLP Stop Time  1147   SLP Time Calculation (min) 45 min   Activity Tolerance Patient tolerated treatment well      Past Medical History  Diagnosis Date  . Hyperlipemia   . Arthritis   . ADHD (attention deficit hyperactivity disorder)   . Wears glasses   . Hypercholesterolemia   . Peyronie's disease   . Kidney stones   . TIA (transient ischemic attack)   . Hypertension   . Anxiety   . GERD (gastroesophageal reflux disease)   . 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 TIA (transient ischemic attack) 12/29/12    left facial droop  . Dysuria 07/15/2014    Past Surgical History  Procedure Laterality Date  . Dupuytren contracture release  2012    left hand  . Tonsillectomy    . Shoulder arthroscopy w/ rotator cuff repair      left  . Colonoscopy    . Cystoscopy w/ ureteroscopy w/ lithotripsy  2011  . Hand surgery      right  . Dupuytren contracture release Right 10/30/2012    Procedure: DUPUYTREN CONTRACTURE RELEASE RIGHT PALM,RING AND SMALL FINGER;  Surgeon: Robert V Sypher Jr., MD;  Location: [facility];  Service: Orthopedics;  Laterality: Right;    There were no vitals filed for this visit.  Visit Diagnosis: Dysphagia      Subjective Assessment - 07/14/14 1116    Subjective Pt points to parathyroid area stating, "It feels tighter." Pt ended 05-25-14. Pt has been drinking mostly PO. Getting 4-6 cans food daily depending on POs.                ADULT SLP TREATMENT - 07/14/14 1114    General Information   Behavior/Cognition Cooperative;Pleasant mood   Treatment Provided   Treatment provided Dysphagia   Dysphagia Treatment   Treatment Methods Skilled observation;Therapeutic exercise;Patient/caregiver education   Patient observed directly with PO's Yes   Type of PO's observed Thin liquids   Liquids provided via Cup;Straw   Oral Phase Signs & Symptoms --  none noted today   Pharyngeal Phase Signs & Symptoms --  none noted today   Other treatment/comments Caregiver (anna) reports pt coughing approx x1/day with H2O. Pt without overt s/s aspiration today with H2O, applesauce, or cereal bar. Pt did require liquid wash (spontaneously done) with cereal bar. SLP reviewed pt's HEP with him and Anna, due to pt's decr'd memory skills. Anna took notes throughout this portion of the visit, and stated she would share these with pt's other caregiver during the day. Mr. Beaston req'd min-mod A occasionally to complete HEP with correct procedure. SLP only moved on to next exercise after pt could adequately perform current exercise.By end of session pt had performed all HEP adequately. He was reminded of ramificaitons of not completing HEP. SLP assessed pt's neck into submental area for fibrosis - none noted.   Pain Assessment   Pain Assessment No/denies   pain          SLP Education - 07/15/14 1341    Education provided Yes   Education Details HEP, muscle fibrosis, atrophy   Person(s) Educated Patient;Caregiver(s)   Methods Explanation;Demonstration;Verbal cues   Comprehension Verbalized understanding;Returned demonstration;Verbal cues required;Need further instruction          SLP Short Term Goals - 05/03/14 1554    SLP SHORT TERM GOAL #1   Title pt will complete HEP with occasional min A   Time 1   Period --  visit   Status Partially Met   SLP SHORT TERM GOAL #2   Title pt will tell SLP why he is completing HEP with rare min A    Time 1   Period --  visit   Status Not Met          SLP Long Term Goals - 07/15/14 1344    SLP LONG TERM GOAL #1   Title pt will complete HEP with occasional min A   Baseline renewed 07-14-14   Time 3   Period --  visits   Status On-going   SLP LONG TERM GOAL #2   Title pt will tell SLP 3 signs/symptoms aspiration PNA   Baseline renewed 07-14-14   Time 3   Period --  visits   Status On-going   SLP LONG TERM GOAL #3   Title pt will tell SLP how a food journal can be helpful in return to full PO diet with rare min A   Baseline renewed 07-14-14   Time 3   Period --  visits   Status On-going          Plan - 07/15/14 1342    Clinical Impression Statement Pt appears safe with POs tested today, so SLP encouraged pt to cont PO diet as tolerated and attempt to eat more (volume) per day. Cargiver/s to assist pt with HEP.    Speech Therapy Frequency --  approx every four weeks   Duration --  for 60 days   Treatment/Interventions Pharyngeal strengthening exercises;Oral motor exercises;Compensatory strategies;Patient/family education;SLP instruction and feedback;Diet toleration management by SLP   Potential to Achieve Goals Good   Potential Considerations Ability to learn/carryover information        Problem List Patient Active Problem List   Diagnosis Date Noted  . Dysuria 07/15/2014  . Dysphagia 06/17/2014  . Drug-induced memory loss 06/17/2014  . Cerebral ventriculomegaly 06/08/2014  . PEG (percutaneous endoscopic gastrostomy) status 06/08/2014  . Alcohol abuse 06/08/2014  . Dilated pupil 06/04/2014  . Aspiration into airway   . Acute delirium 06/02/2014  . Tongue swelling   . Hyponatremia   . Protein-calorie malnutrition, severe 05/26/2014  . Subacute delirium 05/25/2014  . 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 Confusion caused by a drug 05/20/2014  . Protein calorie malnutrition 05/20/2014  . Insomnia 05/10/2014  . Leukopenia due to antineoplastic  chemotherapy 04/28/2014  . Mucositis due to chemotherapy 04/22/2014  . Dehydration 04/22/2014  . Weight loss 04/14/2014  . Chemotherapy-induced nausea 04/14/2014  . 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  . TIA (transient ischemic attack) 12/29/2012  . Hyperlipemia   . ADHD (attention deficit hyperactivity disorder)     SCHINKE,CARL , MS, CCC-SLP  07/15/2014, 1:46 PM  [facility] [address] [place], [place], [address] Phone: [phone]   Fax:  [phone]

## 2014-07-15 NOTE — Patient Instructions (Signed)
Complete exercises until October as prescribed, then twice to three times a week after that.

## 2014-07-15 NOTE — Telephone Encounter (Signed)
No big deal, we can certainly check UA and UCx When do they want to come in? Thanks for addressing the feeding tube issue

## 2014-07-15 NOTE — Telephone Encounter (Signed)
  Oncology Nurse Navigator Documentation   Navigator Encounter Type: Telephone (07/15/14 1017)      Returned patient wife's VMs left this morning.  She reported:  Clair Gulling has begun experiencing urinary incontinence, "dribbling a little bit", asked if UTI could be cause.  He has not complained of burning with urination.  He has appt 07/22/14 with PCP, Dr. Delfina Redwood, at which issue can be addressed.  Concern that PEG insertion site had small amount of dried blood and crust under retention ring.  She stated there is no fresh or purulent drainage, blood; site is not reddened; tube is secure.  She noted appearance followed larger than normal meal over weekend.  I indicated there did not appear to be a reason for concern, encouraged daily cleaning of site, noted site can be assessed next week Wed when he has appts with Drs. Alvy Bimler and Isidore Moos.   She further reported:  Clair Gulling has been seen my Conservation officer, nature but is non-compliant with recommended exercises.  PEG continues to be primary route for nutrition.  Except for a couple of tablespoons of food, he refuses to eat b/c food tastes bad.  He does not appear to have lost weight but this is based on observation, not daily weights.  He met with SLP Garald Balding, but is non-compliant with exercises.     Gayleen Orem, RN, BSN, [place] at [place] [phone]

## 2014-07-15 NOTE — Telephone Encounter (Signed)
  Oncology Nurse Navigator Documentation   Navigator Encounter Type: Telephone (07/15/14 1125)      Spoke with patient wife who stated they can come in prior to 7/20 10:30 appt with Dr. Alvy Bimler for labs.  Dr. Alvy Bimler and RN [place] notified.       Gayleen Orem, RN, BSN, [place] at [place] [phone]

## 2014-07-15 NOTE — Telephone Encounter (Signed)
  Oncology Nurse Navigator Documentation   Navigator Encounter Type: Telephone (07/15/14 1355)      Spoke with patient wife, informed her of 1000 7/20 lab appt, instructed that patient needs to come with full bladder.  She verbalized understanding.         Gayleen Orem, RN, BSN, [place] at [place] [phone]

## 2014-07-16 DIAGNOSIS — M6281 Muscle weakness (generalized): Secondary | ICD-10-CM | POA: Diagnosis not present

## 2014-07-16 DIAGNOSIS — R262 Difficulty in walking, not elsewhere classified: Secondary | ICD-10-CM | POA: Diagnosis not present

## 2014-07-19 ENCOUNTER — Ambulatory Visit (HOSPITAL_COMMUNITY)
Admission: RE | Admit: 2014-07-19 | Discharge: 2014-07-19 | Disposition: A | Discharge status: Home / Self Care | Payer: Medicare Other | Source: Ambulatory Visit | Attending: Radiation Oncology | Admitting: Radiation Oncology

## 2014-07-19 DIAGNOSIS — C01 Malignant neoplasm of base of tongue: Secondary | ICD-10-CM | POA: Insufficient documentation

## 2014-07-19 DIAGNOSIS — Z79899 Other long term (current) drug therapy: Secondary | ICD-10-CM | POA: Diagnosis not present

## 2014-07-19 LAB — GLUCOSE, CAPILLARY: Glucose-Capillary: 92 mg/dL (ref 65–99)

## 2014-07-19 MED ORDER — FLUDEOXYGLUCOSE F - 18 (FDG) INJECTION
8.8500 | Freq: Once | INTRAVENOUS | Status: AC | PRN
  Administered 2014-07-19: 8.85 via INTRAVENOUS

## 2014-07-19 NOTE — Progress Notes (Signed)
Pain Status: He is currently in no pain.. Vital Signs: BP 141/79 mmHg  Pulse 83  Temp(Src) 98.1 F (36.7 C) (Oral)  Resp 12  Wt 170 lb (77.111 kg)  SpO2 100% Orthostatic Standing Vital Signs: BP:102/56 P:92 Pox:99%  Nutritional Status a) intake: Gastrostomy Tube: Glucerna- on average 4 cans a day per tube.  Some days not taking any.  He also drinks Boost/Ensure: 2-3+ PO b) using a feeding tube?: yes c) weight changes, if any:  Wt Readings from Last 3 Encounters:  07/01/14 174 lb 3.2 oz (79.017 kg)  06/28/14 175 lb (79.379 kg)  06/17/14 175 lb (79.379 kg)    Swallowing Status: Pt denies dysphagia  Smoking or chewing tobacco? no  Dental (if applicable): When was last visit with dentistry 06/15  Using fluoride trays daily? No, they said it makes him nausea.    When was last ENT visit? 03/16  When is next ENT visit? Not scheduled  Summary of last medical oncology visit (if applicable) is.   Next med/onc visit is on. 07/21/14 Dr. Alvy Bimler  Imaging done in the last month (if applicable) revealed: 73/57/89 PET scan   Other notable issues, if any: Dry mouth, orthostatic VS.  Orientation: had difficult time stating the date, and year.  He was able to state his name and location.. Also had a difficult time with stating his diet for the day.

## 2014-07-20 ENCOUNTER — Telehealth: Payer: Self-pay | Admitting: *Deleted

## 2014-07-20 DIAGNOSIS — R262 Difficulty in walking, not elsewhere classified: Secondary | ICD-10-CM | POA: Diagnosis not present

## 2014-07-20 DIAGNOSIS — M6281 Muscle weakness (generalized): Secondary | ICD-10-CM | POA: Diagnosis not present

## 2014-07-20 NOTE — Telephone Encounter (Signed)
  Oncology Nurse Navigator Documentation   Navigator Encounter Type: Telephone (07/20/14 1349)      Patient's wife called to confirm appt with Dr. Isidore Moos, stating it was no longer on MyChart. I confirmed appt with Dr. Isidore Moos is tomorrow at 1120 following 1030 appt with Dr. Alvy Bimler.  She verbalized understanding.  Gayleen Orem, RN, BSN, [place] at [place] [phone]

## 2014-07-21 ENCOUNTER — Other Ambulatory Visit (HOSPITAL_BASED_OUTPATIENT_CLINIC_OR_DEPARTMENT_OTHER): Payer: Medicare Other

## 2014-07-21 ENCOUNTER — Ambulatory Visit (HOSPITAL_BASED_OUTPATIENT_CLINIC_OR_DEPARTMENT_OTHER): Payer: Medicare Other

## 2014-07-21 ENCOUNTER — Encounter: Payer: Self-pay | Admitting: Hematology and Oncology

## 2014-07-21 ENCOUNTER — Ambulatory Visit
Admission: RE | Admit: 2014-07-21 | Discharge: 2014-07-21 | Disposition: A | Discharge status: Home / Self Care | Payer: Medicare Other | Source: Ambulatory Visit | Attending: Radiation Oncology | Admitting: Radiation Oncology

## 2014-07-21 ENCOUNTER — Ambulatory Visit (HOSPITAL_BASED_OUTPATIENT_CLINIC_OR_DEPARTMENT_OTHER): Payer: Medicare Other | Admitting: Hematology and Oncology

## 2014-07-21 ENCOUNTER — Telehealth: Payer: Self-pay | Admitting: Hematology and Oncology

## 2014-07-21 VITALS — BP 141/79 | HR 83 | Temp 98.1°F | Resp 12 | Wt 170.0 lb

## 2014-07-21 VITALS — BP 122/79 | HR 92 | Temp 97.7°F | Resp 18 | Ht 73.0 in | Wt 172.1 lb

## 2014-07-21 DIAGNOSIS — D630 Anemia in neoplastic disease: Secondary | ICD-10-CM

## 2014-07-21 DIAGNOSIS — C01 Malignant neoplasm of base of tongue: Secondary | ICD-10-CM

## 2014-07-21 DIAGNOSIS — R300 Dysuria: Secondary | ICD-10-CM | POA: Diagnosis not present

## 2014-07-21 DIAGNOSIS — Z95828 Presence of other vascular implants and grafts: Secondary | ICD-10-CM

## 2014-07-21 DIAGNOSIS — R1310 Dysphagia, unspecified: Secondary | ICD-10-CM

## 2014-07-21 DIAGNOSIS — R634 Abnormal weight loss: Secondary | ICD-10-CM

## 2014-07-21 DIAGNOSIS — E46 Unspecified protein-calorie malnutrition: Secondary | ICD-10-CM

## 2014-07-21 LAB — CBC WITH DIFFERENTIAL/PLATELET
BASO%: 0.2 % (ref 0.0–2.0)
BASOS ABS: 0 10*3/uL (ref 0.0–0.1)
EOS ABS: 0 10*3/uL (ref 0.0–0.5)
EOS%: 0.6 % (ref 0.0–7.0)
HEMATOCRIT: 38.5 % (ref 38.4–49.9)
HGB: 12.9 g/dL — ABNORMAL LOW (ref 13.0–17.1)
LYMPH#: 0.4 10*3/uL — AB (ref 0.9–3.3)
LYMPH%: 8.2 % — ABNORMAL LOW (ref 14.0–49.0)
MCH: 30 pg (ref 27.2–33.4)
MCHC: 33.5 g/dL (ref 32.0–36.0)
MCV: 89.5 fL (ref 79.3–98.0)
MONO#: 0.4 10*3/uL (ref 0.1–0.9)
MONO%: 8.9 % (ref 0.0–14.0)
NEUT%: 82.1 % — ABNORMAL HIGH (ref 39.0–75.0)
NEUTROS ABS: 3.9 10*3/uL (ref 1.5–6.5)
Platelets: 221 10*3/uL (ref 140–400)
RBC: 4.3 10*6/uL (ref 4.20–5.82)
RDW: 15.1 % — ABNORMAL HIGH (ref 11.0–14.6)
WBC: 4.7 10*3/uL (ref 4.0–10.3)

## 2014-07-21 LAB — URINALYSIS, MICROSCOPIC - CHCC
Bilirubin (Urine): NEGATIVE
Blood: NEGATIVE
Glucose: NEGATIVE mg/dL
KETONES: NEGATIVE mg/dL
LEUKOCYTE ESTERASE: NEGATIVE
Nitrite: NEGATIVE
Protein: NEGATIVE mg/dL
RBC / HPF: NEGATIVE (ref 0–2)
Specific Gravity, Urine: 1.015 (ref 1.003–1.035)
Urobilinogen, UR: 0.2 mg/dL (ref 0.2–1)
pH: 6 (ref 4.6–8.0)

## 2014-07-21 LAB — COMPREHENSIVE METABOLIC PANEL (CC13)
ALT: 24 U/L (ref 0–55)
AST: 22 U/L (ref 5–34)
Albumin: 4.1 g/dL (ref 3.5–5.0)
Alkaline Phosphatase: 95 U/L (ref 40–150)
Anion Gap: 9 mEq/L (ref 3–11)
BUN: 24.4 mg/dL (ref 7.0–26.0)
CALCIUM: 10.6 mg/dL — AB (ref 8.4–10.4)
CO2: 26 mEq/L (ref 22–29)
CREATININE: 1.1 mg/dL (ref 0.7–1.3)
Chloride: 102 mEq/L (ref 98–109)
EGFR: 64 mL/min/{1.73_m2} — ABNORMAL LOW (ref >90)
GLUCOSE: 125 mg/dL (ref 70–140)
POTASSIUM: 4.4 meq/L (ref 3.5–5.1)
Sodium: 136 mEq/L (ref 136–145)
Total Bilirubin: 0.91 mg/dL (ref 0.20–1.20)
Total Protein: 7.3 g/dL (ref 6.4–8.3)

## 2014-07-21 LAB — MAGNESIUM (CC13): Magnesium: 2.1 mg/dl (ref 1.5–2.5)

## 2014-07-21 MED ORDER — HEPARIN SOD (PORK) LOCK FLUSH 100 UNIT/ML IV SOLN
500.0000 [IU] | Freq: Once | INTRAVENOUS | Status: AC
  Administered 2014-07-21: 500 [IU] via INTRAVENOUS
  Filled 2014-07-21: qty 5

## 2014-07-21 MED ORDER — SODIUM CHLORIDE 0.9 % IJ SOLN
10.0000 mL | INTRAMUSCULAR | Status: DC | PRN
  Administered 2014-07-21: 10 mL via INTRAVENOUS
  Filled 2014-07-21: qty 10

## 2014-07-21 NOTE — Telephone Encounter (Signed)
lvm for pt regarding to Aug appt....mailed pt appt sched ///avs and letter °

## 2014-07-21 NOTE — Assessment & Plan Note (Signed)
He has mild dysphagia and refused to do exercises that was prescribed by the speech therapist. The patient requests another visit to reinforce the education and exercises that he need to practice at home.

## 2014-07-21 NOTE — Assessment & Plan Note (Signed)
His CT scans show significant response to treatment. There is residual lymphadenopathy of unknown etiology, could be due to scar tissue. We review his case at the ENT tumor board. Final recommendation would be to repeat it with CT scan of the neck in 2 months. In the meantime, I will continue to see him on a regular basis for supportive care.

## 2014-07-21 NOTE — Progress Notes (Signed)
Radiation Oncology         [phone] ________________________________  Name: Dylan Moreno MRN: [id number]  Date: 07/21/2014  DOB: 12-01-1940  Follow-Up Visit Note  Outpatient  CC: Kandice Hams, MD  Jodi Marble, MD  Diagnosis and Prior Radiotherapy:    ICD-9-CM ICD-10-CM   1. 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 Patient  is scheduled with neurologist, Dr. Posey Pronto, on August 2nd for cognitive therapy and subsequently will be reassessed. Patient reports occasional dizziness when standing. Patient has been trying to drink at least four 12 oz bottles of water. Patient reports that nothing tastes good.   Orthostatic Standing Vital Signs: BP:102/56 P:92 Pox:99%   Nutritional Status a) intake: Gastrostomy Tube: Glucerna- on average 4 cans a day per tube.  Some days not taking any.  He also drinks Boost/Ensure: 2-3+ PO b) using a feeding tube?: yes Wt Readings from Last 3 Encounters:  07/01/14 174 lb 3.2 oz (79.017 kg)  06/28/14 175 lb (79.379 kg)  06/17/14 175 lb (79.379 kg)   Swallowing Status: Pt denies dysphagia  Dental (if applicable): When was last visit with dentistry 06/15  Using fluoride trays daily? No, they said it makes him nauseous. But uses special fluoride paste   When was last ENT visit? 03/16  When is next ENT visit? Not scheduled  Summary of last medical oncology visit (if applicable) is. Next med/onc visit is on. 07/21/14 Dr. Alvy Bimler  Imaging done in the last month (if applicable) revealed: 96/22/29 PET scan discussed at  tumor board today - SUV uptake resolution but persistent neck mass felt to be resolving scar tissue. F/u CT in 2 mo.  Other notable issues, if any: Dry mouth, orthostatic VS.Orientation: had difficult time stating the date, and year.  He was able to state his name and location.. Also had a difficult time with stating his diet for the day.                              ALLERGIES:  has No Known Allergies.  Meds: Current Outpatient Prescriptions  Medication Sig Dispense Refill  . amphetamine-dextroamphetamine (ADDERALL) 20 MG tablet Place 1 tablet (20 mg total) into feeding tube 2 (two) times daily. (Patient taking differently: Take 20 mg by mouth 2 (two) times daily. ) 60 tablet 0  . atorvastatin (LIPITOR) 20 MG tablet Place 1 tablet (20 mg total) into feeding tube daily at 6 PM. (Patient taking differently: Take 20 mg by mouth daily at 6 PM. ) 30 tablet 0  . clopidogrel (PLAVIX) 75 MG tablet Place 1 tablet (75 mg total) into feeding tube daily. (Patient taking differently: Take 75 mg by mouth daily. ) 30 tablet 0  . levothyroxine (SYNTHROID, LEVOTHROID) 75 MCG tablet Place 1 tablet (75 mcg total) into feeding tube daily before breakfast. (Patient taking differently: Take 75 mcg by mouth daily before breakfast. ) 30 tablet 0  . Multiple Vitamin (MULTIVITAMIN) tablet Take 1 tablet by  mouth daily.    . sodium fluoride (PREVIDENT 5000 PLUS) 1.1 % CREA dental cream Apply to tooth brush. Brush teeth for 2 minutes. Spit out excess-DO NOT swallow. Repeat nightly. 1 Tube prn  . thiamine 100 MG tablet Take 100 mg by mouth daily.      No current facility-administered medications for this encounter.    Physical Findings: The patient is in no acute distress. Patient is alert and oriented.  weight is 170 lb (77.111 kg). His oral temperature is 98.1 F (36.7 C). His blood pressure is 141/79 and his pulse is 83. His respiration is 12 and oxygen saturation is 100%. .    General: walks w/ cane, in no acute  distress HEENT: Head is normocephalic. Extraocular movements are intact. Dry mouth with no visible tumor or thrush.  Neck: level 2 relatively flat palable mass that is ~2 cm in dimension Heart: Regular in rate and rhythm with no murmurs, rubs, or gallops. Chest: Clear to auscultation bilaterally, with no rhonchi, wheezes, or rales. Abdomen: Soft, nontender, nondistended, with no rigidity or guarding. Extremities: No cyanosis or edema.  Lymphatics: see Neck Exam Skin: No concerning lesions. Musculoskeletal: symmetric strength and muscle tone throughout. Neurologic: Cranial nerves II through XII are grossly intact. No obvious focalities. Psychiatric: cognitive limitations (serial 7s - cannot perform; difficulty finding words, memory issues)   Lab Findings: Lab Results  Component Value Date   WBC 4.7 07/21/2014   HGB 12.9* 07/21/2014   HCT 38.5 07/21/2014   MCV 89.5 07/21/2014   PLT 221 07/21/2014    Radiographic Findings: Nm Pet Image Restag (ps) Skull Base To Thigh  07/19/2014   CLINICAL DATA:  Subsequent treatment strategy for squamous cell carcinoma of base of tongue.  EXAM: NUCLEAR MEDICINE PET SKULL BASE TO THIGH  TECHNIQUE: 8.9 mCi F-18 FDG was injected intravenously. Full-ring PET imaging was performed from the skull base to thigh after the radiotracer. CT data was obtained and used for attenuation correction and anatomic localization.  FASTING BLOOD GLUCOSE:  Value: 92 mg/dl  COMPARISON:  03/18/2014  FINDINGS: NECK  Previously seen hypermetabolic lesion in the posterior tongue base is no longer visualized on current study.  Left level 2 and 3 jugular lymphadenopathy shows decrease in size and hypermetabolic activity compared to previous study. Largest left jugular lymph node at level 2 currently measures 1.9 cm compared to 2.7 cm previously, and has SUV max of 2.9 compared to 8.7 previously.  Sub-cm right level 2 jugular lymphadenopathy has also decreased, currently measuring 7 mm on  image 31/series 4 with SUV max of 2.8 cm, compared to 1.2 cm an SUV max of 5.7 on prior study. No new hypermetabolic cervical lymph nodes identified.  CHEST  No hypermetabolic mediastinal or hilar nodes. A new focal ground-glass opacity is seen in the anterior right lung apex which shows mild metabolic activity with SUV max of 1.9. This does not have typical appearance for metastatic disease and is suspicious for inflammatory or infectious process.  ABDOMEN/PELVIS  No abnormal hypermetabolic activity within the liver, pancreas, adrenal glands, or spleen. No hypermetabolic lymph nodes in the abdomen or pelvis.  Diverticulosis descending and sigmoid colon again demonstrated. No evidence of diverticulitis.  SKELETON  No focal hypermetabolic activity to suggest skeletal metastasis.  IMPRESSION: Resolution of hypermetabolic lesion in posterior tongue base since prior exam.  Significant decrease in bilateral cervical hypermetabolic lymphadenopathy.  No definite evidence of metastatic disease within the chest, abdomen, or pelvis.  New focal ground-glass opacity in  the anterior right lung apex shows metabolic activity, likely inflammatory or infectious in etiology. Recommend continued followup by chest CT in 3-6 months.   Electronically Signed   By: Earle Gell M.D.   On: 07/19/2014 13:19    Impression/Plan: Urged patient to follow through with swallowing exercises. Advised patient to continue to refrain from alcohol and tobacco. His CT scans show significant response to treatment.  Advised to use fluoride treatments for dentition.  Increase PO intake of fluids (somewhat orthostatic by BP today, unclear etiology).  Patient is scheduled for cognitive therapy on August 2nd.   CT scan in 2 months and follow up with me thereafter (imaging has been ordered by med/onc).   _____________________________________   Eppie Gibson, MD  This document serves as a record of services personally performed by Eppie Gibson, MD. It  was created on her behalf by Derek Mound, a trained medical scribe. The creation of this record is based on the scribe's personal observations and the provider's statements to them. This document has been checked and approved by the attending provider.

## 2014-07-21 NOTE — Assessment & Plan Note (Signed)
He has progressive weight loss and has not used his feeding tube. I recommend he start using his feeding tube if he is not able to meet his nutritional intake by mouth. If he is able to maintain his weight at 175 pounds and higher, we might be able to remove his feeding tube in the future

## 2014-07-21 NOTE — Progress Notes (Signed)
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    04/07/2014 - 05/05/2014 Chemotherapy He received 2 doses of high dose cisplatin. He could not received a third dose due to profound side effects.   04/28/2014 Adverse Reaction Cycle 2 chemotherapy is delayed due to neutropenia   05/25/2014 - 06/03/2014 Hospital Admission He was admitted to the hospital for management of acute delirium.   07/19/2014 Imaging PET/CT, restaging:  Resolution of tongue base lesion; significant decrease inbilateral cervical lymphadenopathy.  No definite evidence of metastatic disease.    INTERVAL HISTORY: Please see below for problem oriented charting. He returns for further follow-up. He is stubborn, not participating in much physical activity, swallowing exercises and eating as much as he should. There were no confusion episode. He denies pain.  he has 3 pound weight loss since I saw him.  REVIEW OF SYSTEMS:   Constitutional: Denies fevers,  chills  Eyes: Denies blurriness of vision Ears, nose, mouth, throat, and face: Denies mucositis or sore throat Respiratory: Denies cough, dyspnea or wheezes Cardiovascular: Denies palpitation, chest discomfort or lower extremity swelling Gastrointestinal:  Denies nausea, heartburn or change in bowel habits Skin: Denies abnormal skin rashes Lymphatics: Denies new lymphadenopathy or easy bruising Neurological:Denies numbness, tingling or new weaknesses Behavioral/Psych: Mood is stable, no new changes  All other systems were reviewed with the patient and are negative.  I have reviewed the past medical history, past surgical history, social history and family history with the patient and they are unchanged from previous note.  ALLERGIES:  has No Known Allergies.  MEDICATIONS:  Current Outpatient Prescriptions  Medication Sig Dispense Refill  . amphetamine-dextroamphetamine (ADDERALL) 20 MG tablet Place 1 tablet (20 mg total) into feeding tube 2 (two) times daily. (Patient taking differently: Take 20 mg by mouth 2 (two) times daily. ) 60 tablet 0  . atorvastatin (LIPITOR) 20 MG tablet Place 1 tablet (20 mg total) into feeding tube daily at 6 PM. (Patient taking differently: Take 20 mg by mouth daily at 6 PM. ) 30 tablet 0  . clopidogrel (PLAVIX) 75 MG tablet Place 1 tablet (75 mg total) into feeding tube daily. (Patient taking differently: Take 75 mg by mouth daily. ) 30 tablet 0  . levothyroxine (SYNTHROID, LEVOTHROID) 75 MCG tablet Place 1 tablet (75 mcg total) into feeding tube daily before breakfast. (Patient taking differently: Take 75 mcg by mouth daily before breakfast. ) 30 tablet 0  . Multiple Vitamin (MULTIVITAMIN) tablet Take 1 tablet by mouth daily.    Marland Kitchen  sodium fluoride (PREVIDENT 5000 PLUS) 1.1 % CREA dental cream Apply to tooth brush. Brush teeth for 2 minutes. Spit out excess-DO NOT swallow. Repeat nightly. 1 Tube prn  . thiamine 100 MG tablet Take 100 mg by mouth daily.      No  current facility-administered medications for this visit.    PHYSICAL EXAMINATION: ECOG PERFORMANCE STATUS: 1 - Symptomatic but completely ambulatory  Filed Vitals:   07/21/14 1048  BP: 122/79  Pulse: 92  Temp: 97.7 F (36.5 C)  Resp: 18   Filed Weights   07/21/14 1048  Weight: 172 lb 1.6 oz (78.064 kg)    GENERAL:alert, no distress and comfortable SKIN: skin color, texture, turgor are normal, no rashes or significant lesions EYES: normal, Conjunctiva are pink and non-injected, sclera clear OROPHARYNX:no exudate, no erythema and lips, buccal mucosa, and tongue normal . He has dry mouth NECK: supple, thyroid normal size, non-tender, without nodularity LYMPH:   He has residual palpable lymphadenopathy in the neck. LUNGS: clear to auscultation and percussion with normal breathing effort HEART: regular rate & rhythm and no murmurs and no lower extremity edema ABDOMEN:abdomen soft, non-tender and normal bowel sounds. Feeding tube site looks okay Musculoskeletal:no cyanosis of digits and no clubbing  NEURO: alert & oriented x 3 with fluent speech, no focal motor/sensory deficits  LABORATORY DATA:  I have reviewed the data as listed    Component Value Date/Time   NA 136 07/21/2014 1013   NA 132* 06/04/2014 1352   K 4.4 07/21/2014 1013   K 4.6 06/04/2014 1352   CL 96* 06/04/2014 1352   CO2 26 07/21/2014 1013   CO2 26 06/04/2014 1352   GLUCOSE 125 07/21/2014 1013   GLUCOSE 111* 06/04/2014 1352   BUN 24.4 07/21/2014 1013   BUN 33* 06/04/2014 1352   CREATININE 1.1 07/21/2014 1013   CREATININE 1.01 06/04/2014 1352   CALCIUM 10.6* 07/21/2014 1013   CALCIUM 9.5 06/04/2014 1352   PROT 7.3 07/21/2014 1013   PROT 7.0 06/04/2014 1352   ALBUMIN 4.1 07/21/2014 1013   ALBUMIN 3.0* 06/04/2014 1352   AST 22 07/21/2014 1013   AST 61* 06/04/2014 1352   ALT 24 07/21/2014 1013   ALT 93* 06/04/2014 1352   ALKPHOS 95 07/21/2014 1013   ALKPHOS 109 06/04/2014 1352   BILITOT 0.91  07/21/2014 1013   BILITOT 0.4 06/04/2014 1352   GFRNONAA >60 06/04/2014 1352   GFRAA >60 06/04/2014 1352    No results found for: SPEP, UPEP  Lab Results  Component Value Date   WBC 4.7 07/21/2014   NEUTROABS 3.9 07/21/2014   HGB 12.9* 07/21/2014   HCT 38.5 07/21/2014   MCV 89.5 07/21/2014   PLT 221 07/21/2014      Chemistry      Component Value Date/Time   NA 136 07/21/2014 1013   NA 132* 06/04/2014 1352   K 4.4 07/21/2014 1013   K 4.6 06/04/2014 1352   CL 96* 06/04/2014 1352   CO2 26 07/21/2014 1013   CO2 26 06/04/2014 1352   BUN 24.4 07/21/2014 1013   BUN 33* 06/04/2014 1352   CREATININE 1.1 07/21/2014 1013   CREATININE 1.01 06/04/2014 1352      Component Value Date/Time   CALCIUM 10.6* 07/21/2014 1013   CALCIUM 9.5 06/04/2014 1352   ALKPHOS 95 07/21/2014 1013   ALKPHOS 109 06/04/2014 1352   AST 22 07/21/2014 1013   AST 61* 06/04/2014 1352   ALT 24 07/21/2014 1013   ALT 93* 06/04/2014 1352  BILITOT 0.91 07/21/2014 1013   BILITOT 0.4 06/04/2014 1352       RADIOGRAPHIC STUDIES: I reviewed the PET CT scan with him and family I have personally reviewed the radiological images as listed and agreed with the findings in the report.   ASSESSMENT & PLAN:  Malignant neoplasm of base of tongue His CT scans show significant response to treatment. There is residual lymphadenopathy of unknown etiology, could be due to scar tissue. We review his case at the ENT tumor board. Final recommendation would be to repeat it with CT scan of the neck in 2 months. In the meantime, I will continue to see him on a regular basis for supportive care.  Anemia in neoplastic disease This is likely anemia of chronic disease. The patient denies recent history of bleeding such as epistaxis, hematuria or hematochezia. He is asymptomatic from the anemia. We will observe for now. It is improving    Dysphagia He has mild dysphagia and refused to do exercises that was prescribed by the  speech therapist. The patient requests another visit to reinforce the education and exercises that he need to practice at home.  Weight loss He has progressive weight loss and is not compliant eating. He has not used his feeding tube for a while. I told the patient I would not recommend feeding tube removal for now unless he gained back the weight or have stabilized his weight in his next visit.  Protein calorie malnutrition  He has progressive weight loss and has not used his feeding tube. I recommend he start using his feeding tube if he is not able to meet his nutritional intake by mouth. If he is able to maintain his weight at 175 pounds and higher, we might be able to remove his feeding tube in the future   Orders Placed This Encounter  Procedures  . CT Soft Tissue Neck W Contrast    Standing Status: Future     Number of Occurrences:      Standing Expiration Date: 10/21/2015    Order Specific Question:  Reason for Exam (SYMPTOM  OR DIAGNOSIS REQUIRED)    Answer:  tongue ca, assess response to Rx    Order Specific Question:  Preferred imaging location?    Answer:  [facility]   All questions were answered. The patient knows to call the clinic with any problems, questions or concerns. No barriers to learning was detected. I spent 30 minutes counseling the patient face to face. The total time spent in the appointment was 40 minutes and more than 50% was on counseling and review of test results     [facility], [place], MD 07/21/2014 4:57 PM

## 2014-07-21 NOTE — Assessment & Plan Note (Addendum)
He has progressive weight loss and is not compliant eating. He has not used his feeding tube for a while. I told the patient I would not recommend feeding tube removal for now unless he gained back the weight or have stabilized his weight in his next visit.

## 2014-07-21 NOTE — Patient Instructions (Signed)

## 2014-07-21 NOTE — Assessment & Plan Note (Signed)
This is likely anemia of chronic disease. The patient denies recent history of bleeding such as epistaxis, hematuria or hematochezia. He is asymptomatic from the anemia. We will observe for now. It is improving

## 2014-07-22 ENCOUNTER — Telehealth: Payer: Self-pay | Admitting: *Deleted

## 2014-07-22 DIAGNOSIS — F908 Attention-deficit hyperactivity disorder, other type: Secondary | ICD-10-CM | POA: Diagnosis not present

## 2014-07-22 DIAGNOSIS — R269 Unspecified abnormalities of gait and mobility: Secondary | ICD-10-CM | POA: Diagnosis not present

## 2014-07-22 DIAGNOSIS — C760 Malignant neoplasm of head, face and neck: Secondary | ICD-10-CM | POA: Diagnosis not present

## 2014-07-22 DIAGNOSIS — G459 Transient cerebral ischemic attack, unspecified: Secondary | ICD-10-CM | POA: Diagnosis not present

## 2014-07-22 DIAGNOSIS — M6281 Muscle weakness (generalized): Secondary | ICD-10-CM | POA: Diagnosis not present

## 2014-07-22 DIAGNOSIS — R262 Difficulty in walking, not elsewhere classified: Secondary | ICD-10-CM | POA: Diagnosis not present

## 2014-07-22 DIAGNOSIS — R221 Localized swelling, mass and lump, neck: Secondary | ICD-10-CM | POA: Diagnosis not present

## 2014-07-22 DIAGNOSIS — E039 Hypothyroidism, unspecified: Secondary | ICD-10-CM | POA: Diagnosis not present

## 2014-07-22 LAB — URINE CULTURE

## 2014-07-22 NOTE — Telephone Encounter (Signed)
  Oncology Nurse Navigator Documentation   Navigator Encounter Type: Telephone (07/22/14 1202)      Per pt visit with Dr. Alvy Bimler yesterday and his request, I called [facility], indicated patient wants an appt with Garald Balding SLP for further instruction with swallowing exercises prior to 8/15 scheduled appt.  Marcene Brawn (?) verbalized understanding.   Gayleen Orem, RN, BSN, [place] [facility] [place] [phone]

## 2014-07-22 NOTE — Telephone Encounter (Signed)
-----   Message from Heath Lark, MD sent at 07/22/2014  2:16 PM EDT ----- Regarding: urine culture neg Pls let his wife know ----- Message -----    From: [facility] Interface    Sent: 07/21/2014  10:40 AM      To: Heath Lark, MD

## 2014-07-22 NOTE — Telephone Encounter (Signed)
LVM for wife informing her urine culture negative.

## 2014-07-26 ENCOUNTER — Other Ambulatory Visit: Payer: Self-pay | Admitting: Hematology and Oncology

## 2014-07-26 DIAGNOSIS — R262 Difficulty in walking, not elsewhere classified: Secondary | ICD-10-CM | POA: Diagnosis not present

## 2014-07-26 DIAGNOSIS — M6281 Muscle weakness (generalized): Secondary | ICD-10-CM | POA: Diagnosis not present

## 2014-07-28 DIAGNOSIS — R262 Difficulty in walking, not elsewhere classified: Secondary | ICD-10-CM | POA: Diagnosis not present

## 2014-07-28 DIAGNOSIS — M6281 Muscle weakness (generalized): Secondary | ICD-10-CM | POA: Diagnosis not present

## 2014-08-02 DIAGNOSIS — R262 Difficulty in walking, not elsewhere classified: Secondary | ICD-10-CM | POA: Diagnosis not present

## 2014-08-02 DIAGNOSIS — M6281 Muscle weakness (generalized): Secondary | ICD-10-CM | POA: Diagnosis not present

## 2014-08-03 ENCOUNTER — Ambulatory Visit (INDEPENDENT_AMBULATORY_CARE_PROVIDER_SITE_OTHER): Payer: Medicare Other | Admitting: Clinical

## 2014-08-03 ENCOUNTER — Encounter (HOSPITAL_COMMUNITY): Payer: Self-pay | Admitting: Clinical

## 2014-08-03 DIAGNOSIS — F99 Mental disorder, not otherwise specified: Secondary | ICD-10-CM

## 2014-08-03 DIAGNOSIS — F909 Attention-deficit hyperactivity disorder, unspecified type: Secondary | ICD-10-CM

## 2014-08-03 DIAGNOSIS — R419 Unspecified symptoms and signs involving cognitive functions and awareness: Secondary | ICD-10-CM

## 2014-08-03 NOTE — Progress Notes (Signed)
Patient:   Dylan Moreno   DOB:   1940-03-04  MR Number:  [id number]  Location:  [place] [address] [id number] [place] [address] Dept: [phone]           Date of Service:   08/03/2014  Start Time:   10:00 End Time:   10:59  Provider/Observer:  Jerel Shepherd Counselor       Billing Code/Service: [phone]  Behavioral Observation: BRISTOL SOY  presents as a 74 y.o.-year-old Caucasian Male who appeared his stated age. his dress was Appropriate and he was Casual and his manners were Appropriate to the situation.  There were any physical disabilities noted. In wheel chair because is week because of stay in the hospital (10 days in hospital) and 16 days in rehab center.    he displayed an appropriate level of cooperation and motivation.    Interactions:    Active   Attention:   normal  Memory:   abnormal - Client and family report a loss in short term memory and a loss of dates  Speech (Volume):  normal  Speech:   normal pitch and normal volume  Thought Process:  Coherent and Relevant - slow  Though Content:  WNL   Orientation:   person, place and situation  Judgment:   Fair  Planning:   Fair  Affect:    Appropriate  Mood:    NA  Insight:   Fair  Intelligence:   normal  Chief Complaint:     Chief Complaint  Patient presents with  . Other    Memory issues    Reason for Service:  Referred by Dr. 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   Religious/Spiritual Preferences:  [employer]   Family/Childhood History:                           My father when I was  Grew in [place] up was great - lived with parents until 25. Joined [employer]. Married wife in 1968 and had one child.  Currently lives with wife and son.   Natural/Informal Support:                           Wife and son, and business partner is a family friend.  Substance Use:  No concerns of substance abuse are reported.  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   Medical History:   Past Medical History  Diagnosis Date  . Hyperlipemia   . Arthritis   . ADHD (attention deficit hyperactivity disorder)   . Wears glasses   . Hypercholesterolemia   . Peyronie's disease   . Kidney stones   . TIA (transient  ischemic attack)   . Hypertension   . Anxiety   . GERD (gastroesophageal reflux disease)   . 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 TIA (transient ischemic attack) 12/29/12    left facial droop  . Dysuria 07/15/2014          Medication List       This list is accurate as of: 08/03/14 10:14 AM.  Always use your most recent med list.               amphetamine-dextroamphetamine 20 MG tablet  Commonly known as:  ADDERALL  Place 1 tablet (20 mg total) into feeding tube 2 (two) times daily.     atorvastatin 20 MG tablet  Commonly known as:  LIPITOR  Place 1 tablet (20 mg total) into feeding tube daily at 6 PM.     clopidogrel 75 MG tablet  Commonly known as:  PLAVIX  Place 1 tablet (75 mg total) into feeding tube daily.     levothyroxine 75 MCG tablet  Commonly known as:  SYNTHROID, LEVOTHROID  Place 1 tablet (75 mcg total) into feeding tube daily before breakfast.     multivitamin tablet  Take 1 tablet by mouth daily.     sodium fluoride 1.1 % Crea dental  cream  Commonly known as:  PREVIDENT 5000 PLUS  Apply to tooth brush. Brush teeth for 2 minutes. Spit out excess-DO NOT swallow. Repeat nightly.     thiamine 100 MG tablet  Take 100 mg by mouth daily.              Sexual History:   History  Sexual Activity  . 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 Heart attack Mother     Deceased, 56  . Alcohol abuse Mother   . Hypothyroidism Father     Deceased, 19  . Healthy Son   . Cancer Paternal Uncle     ?lung ca  . Melanoma Brother   . Alcohol abuse Brother     Risk of Suicide/Violence: low  Denies any past or current suicidal or homicidal ideation   History of Suicide/Violence:  None   Psychosis:   Son "He only had hallucinations and delusions when he was having  medication issues." - during chemo and fentanyl patches and morphine  Diagnosis:    Neurocognitive disorder, unspecified  Impression/DX:   MALEKO GREULICH is a 74 y.o.-year-old, married Caucasian Male who presents with Neurocognitive disorder unspecified and ADHD. Demonie was accompanied to his appointment by his wife of 62 years, Vicente Males and his 39  year old son [name]. The information was collaborated by all parties. Abass had been diagnosed and actively treated for ADHD for "several years." He  takes Adderall for the management of his symptoms.  Marguis was tested for alzheimer's prior to his  medical issues starting. While his family encouraged this because they were noticing changes in his memory and behavior, the report came back negative. In 12/2012 - Latrell went to the emergency room because part of his face was drooping. 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 In March 2016 Adler was diagnosed with Cancer (toungue) he underwent chemotherapy and radiation. During which he was hospitalized for hallucinations and agitation, which were likely caused by the combination of medications used to treat for pain (fentanyl patches and morphine). After treatment  Torell reports an increase in difficulty with his short term memory and recalling dates. His wife reported that he forgets "parts of things" for example he will dial the phone and talk and then forget to hang up. He reports that he has a lot of difficulty reading (something he loves to do). His family reports that Germain forgets things he said or did recently - for example the son said "you were upset yesterday" Logan replied "I don't remember that but if you say so it must be true." Donaldson also reports the following symptoms mood swings, appetite changes (taste buds  gone, lost 20 lbs.) sleep changes, racing thoughts, confusion, memory problems, loss of interest, irritability, excessive worry, marital stress, low energy, poor concentration, hyperactivity and physical weakness due to inactivity during 10 daystay in hospital before going to rehab. 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 He is also experiencing difficulties in his relationships with his son and wife. His wife and son report that Jerritt does not participate as well as they would like in his own care - he is not responsible for his own medication,  he is not exercising, and he is not eating. Wife "he does not do what he needs to do to help himself heal." The family Schappell, [name], and Vicente Males) agrees that their stress level as whole has increased. He would like to feel better physically and mentally, be able to read again and be more physical. They would like to understand how to assist Nicolae in getting better as well as learn how they can adjust to the changes that the illness has brought.     Recommendation/Plan: Individual therapy 1x a week, frequency of appointments to decrease as symptoms decrease. Follow safety plan as needed

## 2014-08-10 ENCOUNTER — Encounter: Payer: Self-pay | Admitting: Hematology and Oncology

## 2014-08-10 ENCOUNTER — Telehealth: Payer: Self-pay | Admitting: Hematology and Oncology

## 2014-08-10 ENCOUNTER — Ambulatory Visit (HOSPITAL_BASED_OUTPATIENT_CLINIC_OR_DEPARTMENT_OTHER): Payer: Medicare Other | Admitting: Hematology and Oncology

## 2014-08-10 VITALS — BP 133/79 | HR 96 | Temp 98.1°F | Resp 18 | Ht 73.0 in | Wt 168.2 lb

## 2014-08-10 DIAGNOSIS — I890 Lymphedema, not elsewhere classified: Secondary | ICD-10-CM | POA: Diagnosis not present

## 2014-08-10 DIAGNOSIS — R1310 Dysphagia, unspecified: Secondary | ICD-10-CM

## 2014-08-10 DIAGNOSIS — C01 Malignant neoplasm of base of tongue: Secondary | ICD-10-CM

## 2014-08-10 DIAGNOSIS — Z931 Gastrostomy status: Secondary | ICD-10-CM

## 2014-08-10 DIAGNOSIS — E46 Unspecified protein-calorie malnutrition: Secondary | ICD-10-CM

## 2014-08-10 NOTE — Progress Notes (Signed)
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    04/07/2014 - 05/05/2014 Chemotherapy He received 2 doses of high dose cisplatin. He could not received a third dose due to profound side effects.   04/28/2014 Adverse Reaction Cycle 2 chemotherapy is delayed due to neutropenia   05/25/2014 - 06/03/2014 Hospital Admission He was admitted to the hospital for management of acute delirium.   07/19/2014 Imaging PET/CT, restaging:  Resolution of tongue base lesion; significant decrease inbilateral cervical lymphadenopathy.  No definite evidence of metastatic disease.    INTERVAL HISTORY: Please see below for problem oriented charting. He returns for further follow-up. According to patient and family, he is not eating as well due to altered taste sensation and lack of appetite. He continued to lose weight. He denies any swallowing or choking difficulties. He complained of swelling around his neck. There were no reported  confusion episode. He has concern about possible leak around the feeding tube.  REVIEW OF SYSTEMS:   Constitutional: Denies fevers, chills  Eyes: Denies blurriness of vision Ears, nose, mouth, throat, and face: Denies mucositis or sore throat Respiratory: Denies cough, dyspnea or wheezes Cardiovascular: Denies palpitation, chest discomfort or lower extremity swelling Gastrointestinal:  Denies nausea, heartburn or change in bowel habits Skin: Denies abnormal skin rashes Lymphatics: Denies new lymphadenopathy or easy bruising Neurological:Denies numbness, tingling or new weaknesses Behavioral/Psych: Mood is stable, no new changes  All other systems were reviewed with the patient and are negative.  I have reviewed the past medical history, past surgical history, social history and family history with the patient and they are unchanged from previous note.  ALLERGIES:  has No Known Allergies.  MEDICATIONS:  Current Outpatient Prescriptions  Medication Sig Dispense Refill  . amphetamine-dextroamphetamine (ADDERALL) 20 MG tablet Place 1 tablet (20 mg total) into feeding tube 2 (two) times daily. (Patient taking differently: Take 20 mg by mouth 2 (two) times daily. ) 60 tablet 0  . atorvastatin (LIPITOR) 20 MG tablet Place 1 tablet (20 mg total) into feeding tube daily at 6 PM. (Patient taking differently: Take 20 mg by mouth daily at 6 PM. ) 30 tablet 0  . clopidogrel (PLAVIX) 75 MG tablet Place 1 tablet (75 mg total) into feeding tube daily. (Patient taking differently: Take 75 mg by mouth daily. ) 30 tablet 0  . levothyroxine (SYNTHROID, LEVOTHROID) 75 MCG tablet Place 1 tablet (75 mcg total) into feeding tube daily before breakfast. (Patient taking differently: Take 75 mcg by mouth daily before breakfast. )  30 tablet 0  . Multiple Vitamin (MULTIVITAMIN) tablet Take 1 tablet by mouth daily.    . sodium fluoride (PREVIDENT 5000 PLUS) 1.1 % CREA dental cream Apply to tooth brush. Brush teeth for  2 minutes. Spit out excess-DO NOT swallow. Repeat nightly. 1 Tube prn  . thiamine 100 MG tablet Take 100 mg by mouth daily.      No current facility-administered medications for this visit.    PHYSICAL EXAMINATION: ECOG PERFORMANCE STATUS: 1 - Symptomatic but completely ambulatory  Filed Vitals:   08/10/14 1026  BP: 133/79  Pulse: 96  Temp: 98.1 F (36.7 C)  Resp: 18   Filed Weights   08/10/14 1026  Weight: 168 lb 3.2 oz (76.295 kg)    GENERAL:alert, no distress and comfortable SKIN: skin color, texture, turgor are normal, no rashes or significant lesions EYES: normal, Conjunctiva are pink and non-injected, sclera clear OROPHARYNX:no exudate, no erythema and lips, buccal mucosa, and tongue normal  NECK: -Noted lymphedema around the neck. No palpable lymphadenopathy LYMPH:  no palpable lymphadenopathy in the cervical, axillary or inguinal LUNGS: clear to auscultation and percussion with normal breathing effort HEART: regular rate & rhythm and no murmurs and no lower extremity edema ABDOMEN:abdomen soft, non-tender and normal bowel sounds. There is mildly around the feeding tube without any evidence of cellulitis Musculoskeletal:no cyanosis of digits and no clubbing  NEURO: alert & oriented x 3 with fluent speech, no focal motor/sensory deficits  LABORATORY DATA:  I have reviewed the data as listed    Component Value Date/Time   NA 136 07/21/2014 1013   NA 132* 06/04/2014 1352   K 4.4 07/21/2014 1013   K 4.6 06/04/2014 1352   CL 96* 06/04/2014 1352   CO2 26 07/21/2014 1013   CO2 26 06/04/2014 1352   GLUCOSE 125 07/21/2014 1013   GLUCOSE 111* 06/04/2014 1352   BUN 24.4 07/21/2014 1013   BUN 33* 06/04/2014 1352   CREATININE 1.1 07/21/2014 1013   CREATININE 1.01 06/04/2014 1352   CALCIUM 10.6* 07/21/2014 1013   CALCIUM 9.5 06/04/2014 1352   PROT 7.3 07/21/2014 1013   PROT 7.0 06/04/2014 1352   ALBUMIN 4.1 07/21/2014 1013   ALBUMIN 3.0* 06/04/2014 1352   AST 22  07/21/2014 1013   AST 61* 06/04/2014 1352   ALT 24 07/21/2014 1013   ALT 93* 06/04/2014 1352   ALKPHOS 95 07/21/2014 1013   ALKPHOS 109 06/04/2014 1352   BILITOT 0.91 07/21/2014 1013   BILITOT 0.4 06/04/2014 1352   GFRNONAA >60 06/04/2014 1352   GFRAA >60 06/04/2014 1352    No results found for: SPEP, UPEP  Lab Results  Component Value Date   WBC 4.7 07/21/2014   NEUTROABS 3.9 07/21/2014   HGB 12.9* 07/21/2014   HCT 38.5 07/21/2014   MCV 89.5 07/21/2014   PLT 221 07/21/2014      Chemistry      Component Value Date/Time   NA 136 07/21/2014 1013   NA 132* 06/04/2014 1352   K 4.4 07/21/2014 1013   K 4.6 06/04/2014 1352   CL 96* 06/04/2014 1352   CO2 26 07/21/2014 1013   CO2 26 06/04/2014 1352   BUN 24.4 07/21/2014 1013   BUN 33* 06/04/2014 1352   CREATININE 1.1 07/21/2014 1013   CREATININE 1.01 06/04/2014 1352      Component Value Date/Time   CALCIUM 10.6* 07/21/2014 1013   CALCIUM 9.5 06/04/2014 1352   ALKPHOS 95 07/21/2014 1013   ALKPHOS 109 06/04/2014 1352  AST 22 07/21/2014 1013   AST 61* 06/04/2014 1352   ALT 24 07/21/2014 1013   ALT 93* 06/04/2014 1352   BILITOT 0.91 07/21/2014 1013   BILITOT 0.4 06/04/2014 1352     ASSESSMENT & PLAN:  Malignant neoplasm of base of tongue His CT scans show significant response to treatment. There is residual lymphadenopathy of unknown etiology, could be due to scar tissue. We reviewed his case at the ENT tumor board. Final recommendation would be to repeat it with CT scan of the neck, scheduled for next month In the meantime, I will continue to see him on a regular basis for supportive care.    Lymphedema of face This is related to recent radiation treatment. I will refer him to physical therapy for lymphedema exercises.  Dysphagia He has mild dysphagia and refused to do exercises that was prescribed by the speech therapist. The patient requests another visit to reinforce the education and exercises that he need  to practice at home.    Protein calorie malnutrition  He has progressive weight loss and has not used his feeding tube consistently. I recommend he start using his feeding tube if he is not able to meet his nutritional intake by mouth. If he is able to maintain his weight at 175 pounds and higher, we might be able to remove his feeding tube in the future    PEG (percutaneous endoscopic gastrostomy) status There is very mild leak around the feeding tube. I recommend he put some clean gauze around the feeding tube. Clinically, the site does not look infected and there is no evidence of cellulitis.   Orders Placed This Encounter  Procedures  . Ambulatory referral to Speech Therapy    Referral Priority:  Routine    Referral Type:  Speech Therapy    Referral Reason:  Specialty Services Required    Requested Specialty:  Speech Pathology    Number of Visits Requested:  1  . Ambulatory referral to Physical Therapy    Referral Priority:  Routine    Referral Type:  Physical Medicine    Referral Reason:  Specialty Services Required    Requested Specialty:  Physical Therapy    Number of Visits Requested:  1   All questions were answered. The patient knows to call the clinic with any problems, questions or concerns. No barriers to learning was detected. I spent 25 minutes counseling the patient face to face. The total time spent in the appointment was 30 minutes and more than 50% was on counseling and review of test results     [facility], Euclide Granito, MD 08/10/2014 1:12 PM

## 2014-08-10 NOTE — Assessment & Plan Note (Signed)
This is related to recent radiation treatment. I will refer him to physical therapy for lymphedema exercises.

## 2014-08-10 NOTE — Assessment & Plan Note (Signed)
There is very mild leak around the feeding tube. I recommend he put some clean gauze around the feeding tube. Clinically, the site does not look infected and there is no evidence of cellulitis.

## 2014-08-10 NOTE — Assessment & Plan Note (Signed)
He has progressive weight loss and has not used his feeding tube consistently. I recommend he start using his feeding tube if he is not able to meet his nutritional intake by mouth. If he is able to maintain his weight at 175 pounds and higher, we might be able to remove his feeding tube in the future

## 2014-08-10 NOTE — Assessment & Plan Note (Signed)
His CT scans show significant response to treatment. There is residual lymphadenopathy of unknown etiology, could be due to scar tissue. We reviewed his case at the ENT tumor board. Final recommendation would be to repeat it with CT scan of the neck, scheduled for next month In the meantime, I will continue to see him on a regular basis for supportive care.

## 2014-08-10 NOTE — Assessment & Plan Note (Signed)
He has mild dysphagia and refused to do exercises that was prescribed by the speech therapist. The patient requests another visit to reinforce the education and exercises that he need to practice at home.

## 2014-08-10 NOTE — Telephone Encounter (Signed)
s.w. pt and advised on Sept appt....ok and aware °

## 2014-08-11 DIAGNOSIS — M6281 Muscle weakness (generalized): Secondary | ICD-10-CM | POA: Diagnosis not present

## 2014-08-11 DIAGNOSIS — R262 Difficulty in walking, not elsewhere classified: Secondary | ICD-10-CM | POA: Diagnosis not present

## 2014-08-12 ENCOUNTER — Ambulatory Visit: Payer: Medicare Other | Attending: Hematology and Oncology | Admitting: Physical Therapy

## 2014-08-12 ENCOUNTER — Ambulatory Visit: Payer: Self-pay | Admitting: Neurology

## 2014-08-12 ENCOUNTER — Encounter: Payer: Self-pay | Admitting: Physical Therapy

## 2014-08-12 DIAGNOSIS — I890 Lymphedema, not elsewhere classified: Secondary | ICD-10-CM | POA: Insufficient documentation

## 2014-08-12 DIAGNOSIS — R262 Difficulty in walking, not elsewhere classified: Secondary | ICD-10-CM | POA: Diagnosis not present

## 2014-08-12 DIAGNOSIS — R1310 Dysphagia, unspecified: Secondary | ICD-10-CM | POA: Diagnosis not present

## 2014-08-12 DIAGNOSIS — M6281 Muscle weakness (generalized): Secondary | ICD-10-CM | POA: Diagnosis not present

## 2014-08-12 DIAGNOSIS — R293 Abnormal posture: Secondary | ICD-10-CM | POA: Insufficient documentation

## 2014-08-12 DIAGNOSIS — M436 Torticollis: Secondary | ICD-10-CM | POA: Diagnosis not present

## 2014-08-12 NOTE — Therapy (Signed)
[place], [place], [address] Phone: [phone]   Fax:  [phone]  Physical Therapy Evaluation  Patient Details  Name: Dylan Moreno MRN: [id number] Date of Birth: [birth year] Referring Provider:  Heath Lark, MD  Encounter Date: 08/12/2014      PT End of Session - 08/12/14 1249    Visit Number 1   Number of Visits 16   Date for PT Re-Evaluation 10/07/14   PT Start Time 0930   PT Stop Time 2831   PT Time Calculation (min) 53 min   Activity Tolerance Patient tolerated treatment well   Behavior During Therapy [facility] for tasks assessed/performed;Impulsive      Past Medical History  Diagnosis Date  . Hyperlipemia   . Arthritis   . ADHD (attention deficit hyperactivity disorder)   . Wears glasses   . Hypercholesterolemia   . Peyronie's disease   . Kidney stones   . TIA (transient ischemic attack)   . Hypertension   . Anxiety   . GERD (gastroesophageal reflux disease)   . 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 TIA (transient ischemic attack) 12/29/12    left facial droop  . Dysuria 07/15/2014    Past Surgical History  Procedure Laterality Date  . Dupuytren contracture release  2012    left hand  . Tonsillectomy    . Shoulder arthroscopy w/ rotator cuff repair      left  . Colonoscopy    . Cystoscopy w/ ureteroscopy w/ lithotripsy  2011  . Hand surgery      right  . Dupuytren contracture release Right 10/30/2012    Procedure: DUPUYTREN CONTRACTURE RELEASE RIGHT PALM,RING AND SMALL FINGER;  Surgeon: Cammie Sickle., MD;  Location: [facility];  Service: Orthopedics;  Laterality: Right;    There were no vitals filed for this visit.  Visit Diagnosis:  Poor posture - Plan: PT plan of care cert/re-cert  Stiffness of neck - Plan: PT plan of care cert/re-cert  Lymphedema - Plan: PT plan of care cert/re-cert      Subjective Assessment -  08/12/14 1245    Subjective He reports he is here for neck swelling.  He ambulates with a single point cane for the past 3 months. His wife reports he began having hallucinations due to pain medications in May.  He was hospitalized 10 days at [facility] in May and then was sent to [facility] for 16 days because he was unable to walk.  He was discharged on 06/19/14 to home.  He is currently doing PT at [facility] for gait and strength and balance 3x/week and wants to continue there for that and come here for lymphedema.   Pertinent History Diagnosed 3/16 with cancer on base of tongue. He underwent chemotherapy and radiation and completed that on 05/25/14.  He reports swelling in his neck began after radiation around 06/02/14.            [facility] PT Assessment - 08/12/14 0001    Assessment   Medical Diagnosis Neck lymphedema   Onset Date/Surgical Date 07/16/14   Hand Dominance Right   Next MD Visit 09/22/14   Prior Therapy Baseline assessment at diagnosis   Precautions   Precautions Fall   Restrictions   Weight Bearing Restrictions No   Balance Screen   Has the patient fallen in the past 6 months Yes   How many times? 3  Currently getting PT for balance at [facility]   Has the patient had a decrease in activity level because of a fear of falling?  Yes   Is the patient reluctant to leave their home because of a fear of falling?  Yes   [place] residence   Living Arrangements Spouse/significant other;Children  Wife and adult son   Available Help at Discharge Family   Type of [place] Two level   Alternate Level Stairs-Number of Steps 14   Alternate Level Stairs-Rails Can reach both   Prior Function   Level of Independence Needs assistance with gait  Uses single point cane   Vocation Retired  [employer] at [employer] Was exercising daily walking prior to diagnosis    Cognition   Memory Impaired   Memory Impairment Retrieval deficit;Decreased recall of new information;Decreased short term memory   Decreased Short Term Memory --  Has begun cognitive behavioral therapy   Posture/Postural Control   Posture/Postural Control Postural limitations   Postural Limitations Rounded Shoulders;Forward head;Increased thoracic kyphosis   ROM / Strength   AROM / PROM / Strength AROM   AROM   AROM Assessment Site Cervical   Cervical Flexion WFL   Cervical Extension 75%   Cervical - Right Side Bend 75% loss   Cervical - Left Side Bend 75% loss   Cervical - Right Rotation 25% limited   Cervical - Left Rotation 25% limited   Strength   Overall Strength Unable to assess  Due to time constraints; pt is being treated for at other PT           LYMPHEDEMA/ONCOLOGY QUESTIONNAIRE - 08/12/14 1003    Type   Cancer Type Cancer base of tongue   Treatment   Active Chemotherapy Treatment No   Past Chemotherapy Treatment Yes   Active Radiation Treatment No   Past Radiation Treatment Yes   Date 05/25/14  completed   Body Site Right neck   Current Hormone Treatment No   Past Hormone Therapy No   What other symptoms do you have   Are you Having Heaviness or Tightness Yes   Are you having Pain No   Are you having pitting edema No   Is it Hard or Difficult finding clothes that fit No   Do you have infections No   Is there Decreased scar mobility Yes   [place]mer Sign No   Head and Neck   4 cm superior to sternal notch around neck 42.6 cm   6 cm superior to sternal notch around neck 44.5 cm   8 cm superior to sternal notch around neck 46.5 cm   Other 57.8 cm around head above lip and below nose/ears                        PT Education - 08/12/14 1248    Education provided Yes   Education Details How and when to wear chip pack   Person(s) Educated Patient;Spouse   Methods Explanation;Demonstration   Comprehension Verbalized understanding            Short Term Clinic Goals - 08/12/14 1255    CC Short Term Goal  #1   Title Patient will be able to demonstrate proper technique with initial HEP to increase cervical ROM for promoting lymphatic circulation   Time 4   Period Weeks   Status New  CC Short Term Goal  #2   Title Patient will demonstrate a reduction in neck swelling by >/= 0.5 cm at 8 cm proximal to sternal notch around neck   Time 4   Period Weeks   Status New   CC Short Term Goal  #3   Title Patient's wife or caregiver will demonstrate proper technique for doing manual lymph drainage with some verbal and tactile cues.   Time 4   Period Weeks   Status New             Long Term Clinic Goals - 08/12/14 1311    CC Long Term Goal  #1   Title Patient will be able to demonstrate proper technique with final HEP and verbalize safe self progression to increase cervical ROM for promoting lymphatic circulation   Time 8   Period Weeks   Status New   CC Long Term Goal  #2   Title Patient will demonstrate a reduction in neck swelling by >/= 1 cm at 8 cm proximal to sternal notch around neck   CC Long Term Goal  #3   Title Patient's wife or caregiver will demonstrate proper technique for doing manual lymph drainage without verbal and tactile cues.   CC Long Term Goal  #4   Title Patient and his wife/caregiver will verbalize where and how to be fitted for a head and neck compression garment if they decide to proceed with that.   Time 8   Period Days   Status New         Head and Neck Clinic Goals - 03/17/14 2056    Patient will be able to verbalize understanding of a home exercise program for cervical range of motion, posture, and walking.    Status Achieved   Patient will be able to verbalize understanding of proper sitting and standing posture.    Status Achieved   Patient will be able to verbalize understanding of lymphedema risk and availability of treatment for this condition.     Status Achieved           Plan - 08/12/14 1250    Clinical Impression Statement Patient is a pleasant 74 year old man s/p chemotherapy and radiation for tongue cancer.  He developed hallucinations from pain medications in May this year and was hospitalized 10 days then was in a skilled nursing facility for several weeks.  Since that time, he has been ambulating with a cane and has generalized weakness.  He is being treated in physical therapy at [facility] for those issues but comes to [place] for neck lymphedema treatment.  He will benefit from PT to reduce neck swelling and decrease difficulty with swallowing.  He reports being fearful of this condition but was assured this is treatable if he is able to be compliant.  His wife and another caregiver are present and willing to assist in any way they can.   Pt will benefit from skilled therapeutic intervention in order to improve on the following deficits Increased edema;Decreased range of motion;Postural dysfunction   Rehab Potential Good   Clinical Impairments Affecting Rehab Potential Poor memory and attention   PT Frequency 3x / week   PT Duration 8 weeks   PT Treatment/Interventions ADLs/Self Care Home Management;Manual techniques;Manual lymph drainage;Therapeutic exercise;Patient/family education   PT Next Visit Plan Manual lymph drainage; assess response to wearing chip pack; issue posture exercises if that is not being addressed at [facility] and Agree with Plan  of Care Patient;Family member/caregiver   Family Member Consulted Wife and another caregiver          G-Codes - Sep 09, 2014 1314    Functional Assessment Tool Used Clinical Judgement   Functional Limitation Self care   Self Care Current Status [phone]) At least 40 percent but less than 60 percent impaired, limited or restricted   Self Care Goal Status ([id number]) At least 1 percent but less than 20 percent impaired, limited or restricted        Problem List Patient Active Problem List   Diagnosis Date Noted  . 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 Dysuria 07/15/2014  . Dysphagia 06/17/2014  . Drug-induced memory loss 06/17/2014  . Cerebral ventriculomegaly 06/08/2014  . PEG (percutaneous endoscopic gastrostomy) status 06/08/2014  . Alcohol abuse 06/08/2014  . Dilated pupil 06/04/2014  . Aspiration into airway   . Acute delirium 06/02/2014  . Tongue swelling   . Hyponatremia   . Protein-calorie malnutrition, severe 05/26/2014  . Subacute delirium 05/25/2014  . 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 Confusion caused by a drug 05/20/2014  . Protein calorie malnutrition 05/20/2014  . Insomnia 05/10/2014  . Leukopenia due to antineoplastic chemotherapy 04/28/2014  . Mucositis due to chemotherapy 04/22/2014  . Dehydration 04/22/2014  . Weight loss 04/14/2014  . Chemotherapy-induced nausea 04/14/2014  . 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  . TIA (transient ischemic attack) 12/29/2012  . Hyperlipemia   . ADHD (attention deficit hyperactivity disorder)    Annia Friendly, PT 09-09-2014 2:01 PM  [place], [place], [address] Phone: [phone]   Fax:  [phone]

## 2014-08-12 NOTE — Patient Instructions (Signed)
Issued chip pack to be worn around his neck 2-4 hours per day.  Demonstrated donning chip pack and he and his wife verbalized understanding.

## 2014-08-16 ENCOUNTER — Ambulatory Visit: Payer: Medicare Other

## 2014-08-16 DIAGNOSIS — R293 Abnormal posture: Secondary | ICD-10-CM | POA: Diagnosis not present

## 2014-08-16 DIAGNOSIS — I890 Lymphedema, not elsewhere classified: Secondary | ICD-10-CM | POA: Diagnosis not present

## 2014-08-16 DIAGNOSIS — M436 Torticollis: Secondary | ICD-10-CM | POA: Diagnosis not present

## 2014-08-16 DIAGNOSIS — R1310 Dysphagia, unspecified: Secondary | ICD-10-CM

## 2014-08-16 NOTE — Patient Instructions (Signed)
You must do your exercises if you want to give yourself the best opportunity to remain with safe swallowing.

## 2014-08-16 NOTE — Therapy (Signed)
[facility] [address] [place], [place], [address] Phone: [phone]   Fax:  [phone]  Speech Language Pathology Treatment  Patient Details  Name: Dylan Moreno MRN: [id number] Date of Birth: 06/03/1940 Referring Provider:  Seward Carol, MD  Encounter Date: 08/16/2014      End of Session - 08/16/14 1404    Visit Number 5   Number of Visits 6   Date for SLP Re-Evaluation 09/13/14   SLP Start Time 1324   SLP Stop Time  1401   SLP Time Calculation (min) 37 min   Activity Tolerance Patient tolerated treatment well      Past Medical History  Diagnosis Date  . Hyperlipemia   . Arthritis   . ADHD (attention deficit hyperactivity disorder)   . Wears glasses   . Hypercholesterolemia   . Peyronie's disease   . Kidney stones   . TIA (transient ischemic attack)   . Hypertension   . Anxiety   . GERD (gastroesophageal reflux disease)   . 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 TIA (transient ischemic attack) 12/29/12    left facial droop  . Dysuria 07/15/2014    Past Surgical History  Procedure Laterality Date  . Dupuytren contracture release  2012    left hand  . Tonsillectomy    . Shoulder arthroscopy w/ rotator cuff repair      left  . Colonoscopy    . Cystoscopy w/ ureteroscopy w/ lithotripsy  2011  . Hand surgery      right  . Dupuytren contracture release Right 10/30/2012    Procedure: DUPUYTREN CONTRACTURE RELEASE RIGHT PALM,RING AND SMALL FINGER;  Surgeon: Cammie Sickle., MD;  Location: [facility];  Service: Orthopedics;  Laterality: Right;    There were no vitals filed for this visit.  Visit Diagnosis: Dysphagia      Subjective Assessment - 08/16/14 1330    Subjective "I eat but I don't eat." Pt is noncomplisant with HEP, per wife.               ADULT SLP TREATMENT - 08/16/14 1331    General Information   Behavior/Cognition  Cooperative;Pleasant mood   Treatment Provided   Treatment provided Dysphagia   Dysphagia Treatment   Temperature Spikes Noted No   Respiratory Status Room air   Treatment Methods Skilled observation;Therapeutic exercise   Patient observed directly with PO's Yes   Type of PO's observed Thin liquids   Liquids provided via Straw   Oral Phase Signs & Symptoms --  none noted   Pharyngeal Phase Signs & Symptoms --  none noted   Type of cueing Verbal   Amount of cueing Modified independent   Other treatment/comments HEP procedurally completed with rare min A, easily modified by SLP. Pt performed WNL with these cues. Lymphedema is appreciated on pt's medial and lateral neck but no appreciable fibrosis. SLP strongly encouraged pt to complete HEP as directed until November 15th and then x2/week after that. SLP repeated this and the rationale behind this x2 to pt/wife. Additionally, SLP wrote rationale for HEP on pt's HEP itself.    Pain Assessment   Pain Assessment No/denies pain   Assessment / Recommendations / Plan   Plan Continue with current plan of care  SLP strongly suggested caregiver attend next session    Dysphagia Recommendations   Diet recommendations Dysphagia 3 (mechanical soft);Dysphagia 2 (fine chop);Thin liquid   Progression  Toward Goals   Progression toward goals Progressing toward goals          SLP Education - 08/16/14 1404    Education provided Yes   Education Details HEP for swallowing   Person(s) Educated Patient;Spouse   Methods Explanation;Demonstration;Verbal cues   Comprehension Verbalized understanding;Returned demonstration;Verbal cues required;Need further instruction          SLP Short Term Goals - 05/03/14 1554    SLP SHORT TERM GOAL #1   Title pt will complete HEP with occasional min A   Time 1   Period --  visit   Status Partially Met   SLP SHORT TERM GOAL #2   Title pt will tell SLP why he is completing HEP with rare min A   Time 1   Period  --  visit   Status Not Met          SLP Long Term Goals - 08/16/14 1406    SLP LONG TERM GOAL #1   Title pt will complete HEP with occasional min A   Baseline renewed 07-14-14   Time 2   Period --  visits   Status On-going   SLP LONG TERM GOAL #2   Title pt will tell SLP 3 signs/symptoms aspiration PNA   Baseline renewed 07-14-14   Time 2   Period --  visits   Status On-going   SLP LONG TERM GOAL #3   Title pt will tell SLP how a food journal can be helpful in return to full PO diet with rare min A   Baseline renewed 07-14-14   Time 2   Period --  visits   Status On-going          Plan - 08/16/14 1405    Clinical Impression Statement Pt's success with safe swallowing hinges upon his willingness to complete HEP. Skilled ST cont needed to assess correct completion of HEP as well as assess safety with POs.   Speech Therapy Frequency --  approx every 4 weeks   Duration --  one more visit (until 09-13-14)   Treatment/Interventions Pharyngeal strengthening exercises;Oral motor exercises;Compensatory strategies;Patient/family education;SLP instruction and feedback;Diet toleration management by SLP   Potential to Achieve Goals Good   Potential Considerations Ability to learn/carryover information        Problem List Patient Active Problem List   Diagnosis Date Noted  . 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 Dysuria 07/15/2014  . Dysphagia 06/17/2014  . Drug-induced memory loss 06/17/2014  . Cerebral ventriculomegaly 06/08/2014  . PEG (percutaneous endoscopic gastrostomy) status 06/08/2014  . Alcohol abuse 06/08/2014  . Dilated pupil 06/04/2014  . Aspiration into airway   . Acute delirium 06/02/2014  . Tongue swelling   . Hyponatremia   . Protein-calorie malnutrition, severe 05/26/2014  . Subacute delirium 05/25/2014  . 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 Confusion caused by a drug 05/20/2014  . Protein calorie malnutrition 05/20/2014  . Insomnia 05/10/2014   . Leukopenia due to antineoplastic chemotherapy 04/28/2014  . Mucositis due to chemotherapy 04/22/2014  . Dehydration 04/22/2014  . Weight loss 04/14/2014  . Chemotherapy-induced nausea 04/14/2014  . 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  . TIA (transient ischemic attack) 12/29/2012  . Hyperlipemia   . ADHD (attention deficit hyperactivity disorder)     SCHINKE,CARL , MS, CCC-SLP  08/16/2014, 2:07 PM  [facility] [address] [place], [place], [address] Phone: [phone]  Fax:  [phone]

## 2014-08-17 ENCOUNTER — Ambulatory Visit: Payer: Medicare Other | Admitting: Nutrition

## 2014-08-17 ENCOUNTER — Ambulatory Visit: Payer: Medicare Other | Admitting: Physical Therapy

## 2014-08-17 DIAGNOSIS — M436 Torticollis: Secondary | ICD-10-CM

## 2014-08-17 DIAGNOSIS — I890 Lymphedema, not elsewhere classified: Secondary | ICD-10-CM

## 2014-08-17 DIAGNOSIS — R293 Abnormal posture: Secondary | ICD-10-CM

## 2014-08-17 DIAGNOSIS — R1310 Dysphagia, unspecified: Secondary | ICD-10-CM | POA: Diagnosis not present

## 2014-08-17 DIAGNOSIS — R262 Difficulty in walking, not elsewhere classified: Secondary | ICD-10-CM | POA: Diagnosis not present

## 2014-08-17 DIAGNOSIS — M6281 Muscle weakness (generalized): Secondary | ICD-10-CM | POA: Diagnosis not present

## 2014-08-17 NOTE — Progress Notes (Signed)
Received a phone call from patient's wife requesting suggestions for patient on how to increase oral intake. Contacted patient's wife by telephone who reports patient has been status post treatment approximately 3 months. He is now living at home. Patient has been refusing to eat by mouth secondary to poor taste. He is consuming ensure enlive by mouth and Osmolite 1.5 occasionally through feeding tube. Per wife, patient has not been using feeding tube recently but oral intake is inadequate. She has offered patient a variety of soft foods.  Sometimes patient will eat them.  Sometimes he refuses. Weight documented as 168 pounds decreased from usual body weight of 190 pounds prior to treatment.  Nutrition diagnosis: Inadequate oral intake continues.  Intervention: Educated patient's wife to offer patient 6 cans daily of ensure enlive or Glucerna 1.5 by mouth. If patient unable orrefuses to drink by mouth, patient should substitute one can of Osmolite 1.5 via feeding to total 6 cans daily. Educated patient's wife on soft moist foods to offer patient 3 times a day at mealtimes. Recommended patient continue to drink free water as needed. Questions were answered.  Teach back method was used.  Monitoring, evaluation, goals: Patient will work to increase oral intake to promote weight stabilization.  Next visit: Patient's wife will contact me for questions or concerns.  **Disclaimer: This note was dictated with voice recognition software. Similar sounding words can inadvertently be transcribed and this note may contain transcription errors which may not have been corrected upon publication of note.**

## 2014-08-17 NOTE — Therapy (Signed)
[place], [place], [address] Phone: [phone]   Fax:  [phone]  Physical Therapy Treatment  Patient Details  Name: Dylan Moreno MRN: [id number] Date of Birth: May 09, 1940 Referring Provider:  Heath Lark, MD  Encounter Date: 08/17/2014      PT End of Session - 08/17/14 1749    Visit Number 2   Number of Visits 16   Date for PT Re-Evaluation 10/07/14   PT Start Time 1518   PT Stop Time 1607   PT Time Calculation (min) 49 min   Activity Tolerance Patient tolerated treatment well   Behavior During Therapy [facility] for tasks assessed/performed      Past Medical History  Diagnosis Date  . Hyperlipemia   . Arthritis   . ADHD (attention deficit hyperactivity disorder)   . Wears glasses   . Hypercholesterolemia   . Peyronie's disease   . Kidney stones   . TIA (transient ischemic attack)   . Hypertension   . Anxiety   . GERD (gastroesophageal reflux disease)   . 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 TIA (transient ischemic attack) 12/29/12    left facial droop  . Dysuria 07/15/2014    Past Surgical History  Procedure Laterality Date  . Dupuytren contracture release  2012    left hand  . Tonsillectomy    . Shoulder arthroscopy w/ rotator cuff repair      left  . Colonoscopy    . Cystoscopy w/ ureteroscopy w/ lithotripsy  2011  . Hand surgery      right  . Dupuytren contracture release Right 10/30/2012    Procedure: DUPUYTREN CONTRACTURE RELEASE RIGHT PALM,RING AND SMALL FINGER;  Surgeon: Cammie Sickle., MD;  Location: [facility];  Service: Orthopedics;  Laterality: Right;    There were no vitals filed for this visit.  Visit Diagnosis:  Lymphedema  Poor posture  Stiffness of neck                       [facility] Adult PT Treatment/Exercise - 08/17/14 0001    Manual Therapy   Manual Therapy Edema management;Manual Lymphatic  Drainage (MLD)   Edema Management Fabricated an additional foam chip pack for patient and instructed him and his two caregivers in using it with a short stretch compression bandage that has velcro for attaching it.  Instructed in diaphragmatic breathing.   Manual Lymphatic Drainage (MLD) With instruction to patient and his two caregivers in principles and techniques:  supraclavicular fossae, bilateral shoulder collectors, bilateral axillae; lateral, anterolateral, and anterior neck; posterior neck, chin and cheeks.                PT Education - 08/17/14 1748    Education provided Yes   Education Details use of newer foam chip pack with short stretch bandage to attach it to his neck, snug but not too tight, at least 2 hours a day and prior to therapy sessions; began instruction in manual lymph drainage   Person(s) Educated Patient;Spouse;Caregiver(s)   Methods Explanation;Demonstration   Comprehension Verbalized understanding           Short Term Clinic Goals - 08/12/14 1255    CC Short Term Goal  #1   Title Patient will be able to demonstrate proper technique with initial HEP to increase cervical ROM for promoting lymphatic circulation   Time 4   Period Weeks  Status New   CC Short Term Goal  #2   Title Patient will demonstrate a reduction in neck swelling by >/= 0.5 cm at 8 cm proximal to sternal notch around neck   Time 4   Period Weeks   Status New   CC Short Term Goal  #3   Title Patient's wife or caregiver will demonstrate proper technique for doing manual lymph drainage with some verbal and tactile cues.   Time 4   Period Weeks   Status New             Long Term Clinic Goals - 08/12/14 1311    CC Long Term Goal  #1   Title Patient will be able to demonstrate proper technique with final HEP and verbalize safe self progression to increase cervical ROM for promoting lymphatic circulation   Time 8   Period Weeks   Status New   CC Long Term Goal  #2   Title  Patient will demonstrate a reduction in neck swelling by >/= 1 cm at 8 cm proximal to sternal notch around neck   CC Long Term Goal  #3   Title Patient's wife or caregiver will demonstrate proper technique for doing manual lymph drainage without verbal and tactile cues.   CC Long Term Goal  #4   Title Patient and his wife/caregiver will verbalize where and how to be fitted for a head and neck compression garment if they decide to proceed with that.   Time 8   Period Days   Status New         Head and Neck Clinic Goals - 03/17/14 2056    Patient will be able to verbalize understanding of a home exercise program for cervical range of motion, posture, and walking.    Status Achieved   Patient will be able to verbalize understanding of proper sitting and standing posture.    Status Achieved   Patient will be able to verbalize understanding of lymphedema risk and availability of treatment for this condition.    Status Achieved           Plan - 08/17/14 1750    Clinical Impression Statement Patient and caregivers with many questions today about performing manual lymph drainage and use of chip pack, as well as about lymphedema and its progression.  Began instruction and performance of manual lymph drainage.  Neck felt softer following this treatment.  Caregiver did report that patient is working on posture at his other therapy  sessions (elsewhere, for gait and strengthening).   Pt will benefit from skilled therapeutic intervention in order to improve on the following deficits Increased edema;Decreased range of motion;Postural dysfunction   Rehab Potential Good   Clinical Impairments Affecting Rehab Potential Poor memory and attention   PT Frequency 2x / week   PT Duration 8 weeks   PT Treatment/Interventions ADLs/Self Care Home Management;Manual lymph drainage;Patient/family education   PT Next Visit Plan Continue manual lymph drainage and instruction; remeasure.    Consulted and Agree with Plan of Care Patient;Family member/caregiver        Problem List Patient Active Problem List   Diagnosis Date Noted  . 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 Dysuria 07/15/2014  . Dysphagia 06/17/2014  . Drug-induced memory loss 06/17/2014  . Cerebral ventriculomegaly 06/08/2014  . PEG (percutaneous endoscopic gastrostomy) status 06/08/2014  . Alcohol abuse 06/08/2014  . Dilated pupil 06/04/2014  . Aspiration into airway   . Acute delirium 06/02/2014  . Tongue swelling   .  Hyponatremia   . Protein-calorie malnutrition, severe 05/26/2014  . Subacute delirium 05/25/2014  . 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 Confusion caused by a drug 05/20/2014  . Protein calorie malnutrition 05/20/2014  . Insomnia 05/10/2014  . Leukopenia due to antineoplastic chemotherapy 04/28/2014  . Mucositis due to chemotherapy 04/22/2014  . Dehydration 04/22/2014  . Weight loss 04/14/2014  . Chemotherapy-induced nausea 04/14/2014  . 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  . TIA (transient ischemic attack) 12/29/2012  . Hyperlipemia   . ADHD (attention deficit hyperactivity disorder)     Ernesto Lashway 08/17/2014, 5:53 PM  [facility] [place], [place], [address] Phone: [phone]   Fax:  [place], PT 08/17/2014 5:53 PM

## 2014-08-18 ENCOUNTER — Ambulatory Visit: Payer: Medicare Other | Admitting: Physical Therapy

## 2014-08-18 DIAGNOSIS — M436 Torticollis: Secondary | ICD-10-CM | POA: Diagnosis not present

## 2014-08-18 DIAGNOSIS — I890 Lymphedema, not elsewhere classified: Secondary | ICD-10-CM

## 2014-08-18 DIAGNOSIS — R293 Abnormal posture: Secondary | ICD-10-CM

## 2014-08-18 DIAGNOSIS — M6281 Muscle weakness (generalized): Secondary | ICD-10-CM | POA: Diagnosis not present

## 2014-08-18 DIAGNOSIS — R262 Difficulty in walking, not elsewhere classified: Secondary | ICD-10-CM | POA: Diagnosis not present

## 2014-08-18 DIAGNOSIS — R1310 Dysphagia, unspecified: Secondary | ICD-10-CM | POA: Diagnosis not present

## 2014-08-18 NOTE — Patient Instructions (Signed)
MANUAL LYMPH DRAINAGE FOR NECK AND FACE  1)    Place hands beside neck, just behind collar bones; do 10 stationary circles with pressure outward. 1) Do stationary circles at right armpit area (about 10 times) and the left armpit (about 10 times) 2) Place hand at front of shoulder and do circles downward, about 10 times each side. 3) Place hands on areas just above collar bones and do 15-20 stationary circles outward 4) Place hands on either side of neck and do 15-20 stationary circles back and down. 5) Place one hand a little between side and front of the neck and do circles back and down; same with other hand on other side of neck, 15-20 times. 6) Place the webspace between your thumb and first finger just below the chin and do a gentle "pump" downward; lift hand and repeat this slightly farther down the front of the neck, doing this about five times to move from just below chin down the neck.  Do this about five times. 7) Do stationary circles on the chin, directing fluid downward (10 times) 8) Do stationary circles on each side of the face on the cheeks directing fluid downward (10x) 9) Do stationary circles on each side of the face between the eyes and ears (10x) 10) Repeat steps 6, 5, 4, 3, 2, and 1    Do this once or twice a day. Do not slide on the skin. Only give enough pressure to stretch the skin. Make sure to always wash your hands prior to massage.

## 2014-08-18 NOTE — Therapy (Signed)
[place], [place], [address] Phone: [phone]   Fax:  [phone]  Physical Therapy Treatment  Patient Details  Name: Dylan Moreno MRN: [id number] Date of Birth: 10/19/1940 Referring Provider:  Heath Lark, MD  Encounter Date: 08/18/2014      PT End of Session - 08/18/14 2122    Visit Number 3   Number of Visits 16   Date for PT Re-Evaluation 10/07/14   PT Start Time 1525   PT Stop Time 1607   PT Time Calculation (min) 42 min   Activity Tolerance Patient tolerated treatment well   Behavior During Therapy [facility] for tasks assessed/performed      Past Medical History  Diagnosis Date  . Hyperlipemia   . Arthritis   . ADHD (attention deficit hyperactivity disorder)   . Wears glasses   . Hypercholesterolemia   . Peyronie's disease   . Kidney stones   . TIA (transient ischemic attack)   . Hypertension   . Anxiety   . GERD (gastroesophageal reflux disease)   . 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 TIA (transient ischemic attack) 12/29/12    left facial droop  . Dysuria 07/15/2014    Past Surgical History  Procedure Laterality Date  . Dupuytren contracture release  2012    left hand  . Tonsillectomy    . Shoulder arthroscopy w/ rotator cuff repair      left  . Colonoscopy    . Cystoscopy w/ ureteroscopy w/ lithotripsy  2011  . Hand surgery      right  . Dupuytren contracture release Right 10/30/2012    Procedure: DUPUYTREN CONTRACTURE RELEASE RIGHT PALM,RING AND SMALL FINGER;  Surgeon: Cammie Sickle., MD;  Location: [facility];  Service: Orthopedics;  Laterality: Right;    There were no vitals filed for this visit.  Visit Diagnosis:  Lymphedema  Poor posture      Subjective Assessment - 08/18/14 1526    Subjective Nothing new since yesterday.  "I didn't feel bad at all."   Patient is accompained by: Family member  and other  caregiver   Currently in Pain? No/denies                         [facility] Adult PT Treatment/Exercise - 08/18/14 0001    Manual Therapy   Manual Lymphatic Drainage (MLD) With instruction particularly to patient's wife today (verbal, demonstration, and tactile cueing) and having her perform it:  supraclavicular fossae, bilateral shoulder collectors, bilateral axillae; lateral, anterolateral, and anterior neck; posterior neck, chin and cheeks.                PT Education - 08/18/14 2121    Education provided Yes   Education Details manual lymph drainage    Person(s) Educated Spouse   Methods Explanation;Demonstration;Tactile cues;Verbal cues;Handout   Comprehension Returned demonstration           Short Term Clinic Goals - 08/18/14 2124    CC Short Term Goal  #3   Title Patient's wife or caregiver will demonstrate proper technique for doing manual lymph drainage with some verbal and tactile cues.   Status Achieved             Long Term Clinic Goals - 08/12/14 1311    CC Long Term Goal  #1   Title Patient will be able to demonstrate proper technique with  final HEP and verbalize safe self progression to increase cervical ROM for promoting lymphatic circulation   Time 8   Period Weeks   Status New   CC Long Term Goal  #2   Title Patient will demonstrate a reduction in neck swelling by >/= 1 cm at 8 cm proximal to sternal notch around neck   CC Long Term Goal  #3   Title Patient's wife or caregiver will demonstrate proper technique for doing manual lymph drainage without verbal and tactile cues.   CC Long Term Goal  #4   Title Patient and his wife/caregiver will verbalize where and how to be fitted for a head and neck compression garment if they decide to proceed with that.   Time 8   Period Days   Status New         Head and Neck Clinic Goals - 03/17/14 2056    Patient will be able to verbalize understanding of a home exercise program for  cervical range of motion, posture, and walking.    Status Achieved   Patient will be able to verbalize understanding of proper sitting and standing posture.    Status Achieved   Patient will be able to verbalize understanding of lymphedema risk and availability of treatment for this condition.    Status Achieved           Plan - 08/18/14 2122    Clinical Impression Statement Making excellent progress with instructing manual lymph drainage, today to patient's wife, having her perform it. Wife and other caregiver have noticed improvement in swelling already.   Pt will benefit from skilled therapeutic intervention in order to improve on the following deficits Increased edema;Decreased range of motion;Postural dysfunction   Rehab Potential Good   Clinical Impairments Affecting Rehab Potential Poor memory and attention   PT Frequency 2x / week   PT Duration 8 weeks   PT Treatment/Interventions Manual lymph drainage;Patient/family education   PT Next Visit Plan Continue manual lymph drainage and instruction; remeasure.   PT Home Exercise Plan manual lymph drainage daily   Consulted and Agree with Plan of Care Patient        Problem List Patient Active Problem List   Diagnosis Date Noted  . 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 Dysuria 07/15/2014  . Dysphagia 06/17/2014  . Drug-induced memory loss 06/17/2014  . Cerebral ventriculomegaly 06/08/2014  . PEG (percutaneous endoscopic gastrostomy) status 06/08/2014  . Alcohol abuse 06/08/2014  . Dilated pupil 06/04/2014  . Aspiration into airway   . Acute delirium 06/02/2014  . Tongue swelling   . Hyponatremia   . Protein-calorie malnutrition, severe 05/26/2014  . Subacute delirium 05/25/2014  . 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 Confusion caused by a drug 05/20/2014  . Protein calorie malnutrition 05/20/2014  . Insomnia 05/10/2014  . Leukopenia due to antineoplastic chemotherapy 04/28/2014  . Mucositis due  to chemotherapy 04/22/2014  . Dehydration 04/22/2014  . Weight loss 04/14/2014  . Chemotherapy-induced nausea 04/14/2014  . 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  . TIA (transient ischemic attack) 12/29/2012  . Hyperlipemia   . ADHD (attention deficit hyperactivity disorder)     Opel Lejeune 08/18/2014, 9:25 PM  [facility] [place], [place], [address] Phone: [phone]   Fax:  [place], PT 08/18/2014 9:25 PM

## 2014-08-20 DIAGNOSIS — R262 Difficulty in walking, not elsewhere classified: Secondary | ICD-10-CM | POA: Diagnosis not present

## 2014-08-20 DIAGNOSIS — M6281 Muscle weakness (generalized): Secondary | ICD-10-CM | POA: Diagnosis not present

## 2014-08-23 DIAGNOSIS — R262 Difficulty in walking, not elsewhere classified: Secondary | ICD-10-CM | POA: Diagnosis not present

## 2014-08-23 DIAGNOSIS — M6281 Muscle weakness (generalized): Secondary | ICD-10-CM | POA: Diagnosis not present

## 2014-08-24 ENCOUNTER — Other Ambulatory Visit: Payer: Self-pay | Admitting: Hematology and Oncology

## 2014-08-24 ENCOUNTER — Ambulatory Visit: Payer: Medicare Other | Admitting: Physical Therapy

## 2014-08-24 DIAGNOSIS — I890 Lymphedema, not elsewhere classified: Secondary | ICD-10-CM

## 2014-08-24 DIAGNOSIS — R293 Abnormal posture: Secondary | ICD-10-CM | POA: Diagnosis not present

## 2014-08-24 DIAGNOSIS — M436 Torticollis: Secondary | ICD-10-CM | POA: Diagnosis not present

## 2014-08-24 DIAGNOSIS — R1310 Dysphagia, unspecified: Secondary | ICD-10-CM | POA: Diagnosis not present

## 2014-08-24 NOTE — Therapy (Signed)
[place], [place], [address] Phone: [phone]   Fax:  [phone]  Physical Therapy Treatment  Patient Details  Name: Dylan Moreno MRN: [id number] Date of Birth: 11-05-1940 Referring Provider:  Heath Lark, MD  Encounter Date: 08/24/2014      PT End of Session - 08/24/14 1727    Visit Number 4   Number of Visits 16   Date for PT Re-Evaluation 10/07/14   PT Start Time 0973   PT Stop Time 1520   PT Time Calculation (min) 42 min   Activity Tolerance Patient tolerated treatment well   Behavior During Therapy [facility] for tasks assessed/performed      Past Medical History  Diagnosis Date  . Hyperlipemia   . Arthritis   . ADHD (attention deficit hyperactivity disorder)   . Wears glasses   . Hypercholesterolemia   . Peyronie's disease   . Kidney stones   . TIA (transient ischemic attack)   . Hypertension   . Anxiety   . GERD (gastroesophageal reflux disease)   . 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 TIA (transient ischemic attack) 12/29/12    left facial droop  . Dysuria 07/15/2014    Past Surgical History  Procedure Laterality Date  . Dupuytren contracture release  2012    left hand  . Tonsillectomy    . Shoulder arthroscopy w/ rotator cuff repair      left  . Colonoscopy    . Cystoscopy w/ ureteroscopy w/ lithotripsy  2011  . Hand surgery      right  . Dupuytren contracture release Right 10/30/2012    Procedure: DUPUYTREN CONTRACTURE RELEASE RIGHT PALM,RING AND SMALL FINGER;  Surgeon: Cammie Sickle., MD;  Location: [facility];  Service: Orthopedics;  Laterality: Right;    There were no vitals filed for this visit.  Visit Diagnosis:  Lymphedema  Poor posture      Subjective Assessment - 08/24/14 1438    Subjective Fell and skinned his knee when on hardwood floors in stocking feet.  Hasn't had a chance to do manual lymph drainage at  home.  Has been wearing the chip pack and would be glad to wear it all day.   Currently in Pain? No/denies            [facility] PT Assessment - 08/24/14 0001    Observation/Other Assessments   Skin Integrity left anterior knee is skinned, but just superficial layer; not bleeding           LYMPHEDEMA/ONCOLOGY QUESTIONNAIRE - 08/24/14 1441    Head and Neck   4 cm superior to sternal notch around neck 43.5 cm   6 cm superior to sternal notch around neck 44.8 cm   8 cm superior to sternal notch around neck 46.4 cm   Other 56 cm. around head above lip and below nose/ears                  [facility] Adult PT Treatment/Exercise - 08/24/14 0001    Manual Therapy   Edema Management Circumference measurements taken   Manual Lymphatic Drainage (MLD) Instructed patient in manual lymph drainage today with him sitting in a chair in front of a mirror:  diaphragmatic breathing, short neck; bilateral supraclavicular fossae, shoulder collectors, and axillae; lateral, anterolateral, and anterior neck; chin, cheeks and preauricular area.  PT Education - 08/24/14 1726    Education provided Yes   Education Details self-manual lymph drainage   Person(s) Educated Patient   Methods Explanation;Demonstration;Tactile cues;Verbal cues   Comprehension Verbalized understanding;Returned demonstration           Short Term Clinic Goals - 08/18/14 2124    CC Short Term Goal  #3   Title Patient's wife or caregiver will demonstrate proper technique for doing manual lymph drainage with some verbal and tactile cues.   Status Achieved             Long Term Clinic Goals - 08/12/14 1311    CC Long Term Goal  #1   Title Patient will be able to demonstrate proper technique with final HEP and verbalize safe self progression to increase cervical ROM for promoting lymphatic circulation   Time 8   Period Weeks   Status New   CC Long Term Goal  #2   Title Patient will  demonstrate a reduction in neck swelling by >/= 1 cm at 8 cm proximal to sternal notch around neck   CC Long Term Goal  #3   Title Patient's wife or caregiver will demonstrate proper technique for doing manual lymph drainage without verbal and tactile cues.   CC Long Term Goal  #4   Title Patient and his wife/caregiver will verbalize where and how to be fitted for a head and neck compression garment if they decide to proceed with that.   Time 8   Period Days   Status New         Head and Neck Clinic Goals - 03/17/14 2056    Patient will be able to verbalize understanding of a home exercise program for cervical range of motion, posture, and walking.    Status Achieved   Patient will be able to verbalize understanding of proper sitting and standing posture.    Status Achieved   Patient will be able to verbalize understanding of lymphedema risk and availability of treatment for this condition.    Status Achieved           Plan - 08/24/14 1727    Clinical Impression Statement Patient did fairly well today performing manual lymph drainage with therapist cueing, though he does have some memory issues and asks questions more than one time.  Wife was present to give feedback as well; they have not tried this at home yet with her performing it.  Circumference measurements of neck not significantly improved today.   Pt will benefit from skilled therapeutic intervention in order to improve on the following deficits Increased edema;Decreased range of motion;Postural dysfunction   Rehab Potential Good   Clinical Impairments Affecting Rehab Potential Poor memory and attention   PT Frequency 2x / week   PT Duration 8 weeks   PT Treatment/Interventions Manual lymph drainage;Patient/family education   PT Next Visit Plan Continue manual lymph drainage and instruction.   PT Home Exercise Plan manual lymph drainage daily   Consulted and Agree with Plan of Care Patient         Problem List Patient Active Problem List   Diagnosis Date Noted  . 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 Dysuria 07/15/2014  . Dysphagia 06/17/2014  . Drug-induced memory loss 06/17/2014  . Cerebral ventriculomegaly 06/08/2014  . PEG (percutaneous endoscopic gastrostomy) status 06/08/2014  . Alcohol abuse 06/08/2014  . Dilated pupil 06/04/2014  . Aspiration into airway   . Acute delirium 06/02/2014  . Tongue swelling   .  Hyponatremia   . Protein-calorie malnutrition, severe 05/26/2014  . Subacute delirium 05/25/2014  . 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 Confusion caused by a drug 05/20/2014  . Protein calorie malnutrition 05/20/2014  . Insomnia 05/10/2014  . Leukopenia due to antineoplastic chemotherapy 04/28/2014  . Mucositis due to chemotherapy 04/22/2014  . Dehydration 04/22/2014  . Weight loss 04/14/2014  . Chemotherapy-induced nausea 04/14/2014  . 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  . TIA (transient ischemic attack) 12/29/2012  . Hyperlipemia   . ADHD (attention deficit hyperactivity disorder)     SALISBURY,DONNA 08/24/2014, 5:32 PM  [place], [place], [address] Phone: [phone]   Fax:  [place], PT 08/24/2014 5:32 PM

## 2014-08-24 NOTE — Progress Notes (Signed)
Patient ID: Dylan Moreno, male   DOB: 30-Jun-1940, 74 y.o.   MRN: [id number]    Facility: [facility]      No Known Allergies  Chief Complaint  Patient presents with  . Discharge Note    HPI:  He is being discharged to home with pt/st. He will not need dme. He will need his prescriptions written and will need a follow up with his pcp.  He had been hospitalized for alcohol delirium and dysphagia. He was admitted to this facility for short term rehab and is ready for discharge to home.    Past Medical History  Diagnosis Date  . Hyperlipemia   . Arthritis   . ADHD (attention deficit hyperactivity disorder)   . Wears glasses   . Hypercholesterolemia   . Peyronie's disease   . Kidney stones   . TIA (transient ischemic attack)   . Hypertension   . Anxiety   . GERD (gastroesophageal reflux disease)   . 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 TIA (transient ischemic attack) 12/29/12    left facial droop  . Dysuria 07/15/2014    Past Surgical History  Procedure Laterality Date  . Dupuytren contracture release  2012    left hand  . Tonsillectomy    . Shoulder arthroscopy w/ rotator cuff repair      left  . Colonoscopy    . Cystoscopy w/ ureteroscopy w/ lithotripsy  2011  . Hand surgery      right  . Dupuytren contracture release Right 10/30/2012    Procedure: DUPUYTREN CONTRACTURE RELEASE RIGHT PALM,RING AND SMALL FINGER;  Surgeon: Cammie Sickle., MD;  Location: [facility];  Service: Orthopedics;  Laterality: Right;    VITAL SIGNS BP 128/68 mmHg  Pulse 90  Ht 6' (1.829 m)  Wt 128 lb (58.06 kg)  BMI 17.36 kg/m2  Patient's Medications  New Prescriptions   No medications on file  Previous Medications   AMINO ACIDS-PROTEIN HYDROLYS (FEEDING SUPPLEMENT, PRO-STAT SUGAR FREE 64,) LIQD    Place 30 mLs into feeding tube 3 (three) times daily.  AMPHETAMINE-DEXTROAMPHETAMINE (ADDERALL) 20 MG TABLET    Place 1 tablet  (20 mg total) into feeding tube 2 (two) times daily.  ANTISEPTIC ORAL RINSE (CPC / CETYLPYRIDINIUM CHLORIDE 0.05%) 0.05 % LIQD SOLUTION    7 mLs by Mouth Rinse route 2 times daily at 12 noon and 4 pm.  ASPIRIN 81 MG TABLET    Place 1 tablet (81 mg total) into feeding tube daily.  ATORVASTATIN (LIPITOR) 20 MG TABLET    Place 1 tablet (20 mg total) into feeding tube daily at 6 PM.  CHLORHEXIDINE (PERIDEX) 0.12 % SOLUTION    15 mLs by Mouth Rinse route 2 (two) times daily.  CLOPIDOGREL (PLAVIX) 75 MG TABLET    Place 1 tablet (75 mg total) into feeding tube daily.  LEVOTHYROXINE (SYNTHROID, LEVOTHROID) 75 MCG TABLET    Place 1 tablet (75 mcg total) into feeding tube daily before breakfast.  MORPHINE SULFATE (MORPHINE CONCENTRATE) 10 MG/0.5ML SOLN CONCENTRATED SOLUTION    Place 0.5 mLs (10 mg total) into feeding tube every 2 (two) hours as needed for moderate pain, severe pain or shortness of breath.  NUTRITIONAL SUPPLEMENTS (FEEDING SUPPLEMENT, GLUCERNA 1.2 CAL,) LIQD    Place 474 mLs into feeding tube 3 (three) times daily. Use 160mL free water before and after each tube feed  ONDANSETRON (ZOFRAN) 8 MG TABLET  Place 1 tablet (8 mg total) into feeding tube every 8 (eight) hours as needed for nausea or vomiting.  THIAMINE (VITAMIN B-1) 100 MG TABLET    Place 1 tablet (100 mg total) into feeding tube daily. Take 1 tablet TID for one week, then 1 tablet daily thereafter     Modified Medications   No medications on file  Discontinued Medications   No medications on file     SIGNIFICANT DIAGNOSTIC EXAMS  05-25-14: Stable ventricular prominence.  No acute abnormality is noted.  05-27-14: ct of neck soft tissue: Cervical adenopathy has improved since 03/18/2014 related to cancer treatment. Large necrotic node on the left is smaller. Additional posterior lymph node on the left is smaller as well. Small right level 2 lymph node slightly smaller. No new adenopathy Tongue base mass difficult to see on the  current study.   05-27-14: ct of head: Ventricular enlargement slightly more prominent. Findings suggest communicating hydrocephalus  Negative for metastatic disease or acute infarct.  05-28-14: chest x-ray: Suggestion of minimal vascular congestion with mild linear atelectasis over the right midlung and left base.   06-02-14: swallow study: Ice chips PRN after oral care;Free water protocol after oral care;NPO    LABS REVIEWED:   05-24-14: wbc 1.9; hgb 10.1; hct 29.7; mcv 86.6; plt 368; glucose 122; bun 25.9; creat 0.9; k+4.8; na++136; liver nromal albumin 2.9 05-26-14: wbc 2.9; hgb 10.0; hct 29.2; mcv 86.9; plt 400; glucose 101; bun 26; creat 1.02; k+4.5; na++134; liver normal albumin 3.2 05-27-14: tsh 2.987; vit b1: 157.3; folate 37.0; vit b12: 758; ammonia 20 06-01-14: glucose 130; bun 30; creat 0.69; k+4.4; na++131    Review of Systems  Constitutional: Negative for appetite change and fatigue.  HENT: Negative for congestion.   Respiratory: Negative for cough, chest tightness and shortness of breath.   Cardiovascular: Negative for chest pain, palpitations and leg swelling.  Gastrointestinal: Negative for nausea, abdominal pain, diarrhea and constipation.  Musculoskeletal: Negative for myalgias and arthralgias.  Skin: Negative for pallor.  Neurological: Negative for dizziness.  Psychiatric/Behavioral: The patient is not nervous/anxious.       Physical Exam  Constitutional: No distress.  Eyes: Conjunctivae are normal.  Neck: Neck supple. No JVD present. No thyromegaly present.  Cardiovascular: Normal rate, regular rhythm and intact distal pulses.   Respiratory: Effort normal and breath sounds normal. No respiratory distress. He has no wheezes.  GI: Soft. Bowel sounds are normal. He exhibits no distension. There is no tenderness.  Musculoskeletal: He exhibits no edema.  Able to move all extremities   Lymphadenopathy:    He has no cervical adenopathy.  Neurological: He is alert.    Skin: Skin is warm and dry. He is not diaphoretic.  Psychiatric: He has a normal mood and affect.     ASSESSMENT/ PLAN:  Will discharge to home with home health for pt/st to evaluate and treat as indicated for gait balance swallowing; cognition. His prescriptions have been written for a 30 day supply of his medications with #30 adderall 20 mg tabs. Has a follow up with Dr. Delfina [place]wood on 06-28-14: at 9 AM    Time spent with patient  40  minutes >50% time spent counseling; reviewing medical record; tests; labs; and developing future plan of care   Ok Edwards NP [facility] Adult Medicine  Contact [phone] Monday through Friday 8am- 5pm  After hours call [phone]

## 2014-08-25 DIAGNOSIS — R262 Difficulty in walking, not elsewhere classified: Secondary | ICD-10-CM | POA: Diagnosis not present

## 2014-08-25 DIAGNOSIS — M6281 Muscle weakness (generalized): Secondary | ICD-10-CM | POA: Diagnosis not present

## 2014-08-26 ENCOUNTER — Ambulatory Visit: Payer: Medicare Other | Admitting: Physical Therapy

## 2014-08-26 ENCOUNTER — Telehealth: Payer: Self-pay | Admitting: *Deleted

## 2014-08-26 ENCOUNTER — Other Ambulatory Visit: Payer: Self-pay | Admitting: Hematology and Oncology

## 2014-08-26 DIAGNOSIS — I890 Lymphedema, not elsewhere classified: Secondary | ICD-10-CM | POA: Diagnosis not present

## 2014-08-26 DIAGNOSIS — M436 Torticollis: Secondary | ICD-10-CM | POA: Diagnosis not present

## 2014-08-26 DIAGNOSIS — R1310 Dysphagia, unspecified: Secondary | ICD-10-CM

## 2014-08-26 DIAGNOSIS — F908 Attention-deficit hyperactivity disorder, other type: Secondary | ICD-10-CM | POA: Diagnosis not present

## 2014-08-26 DIAGNOSIS — E039 Hypothyroidism, unspecified: Secondary | ICD-10-CM | POA: Diagnosis not present

## 2014-08-26 DIAGNOSIS — R293 Abnormal posture: Secondary | ICD-10-CM | POA: Diagnosis not present

## 2014-08-26 DIAGNOSIS — G459 Transient cerebral ischemic attack, unspecified: Secondary | ICD-10-CM | POA: Diagnosis not present

## 2014-08-26 DIAGNOSIS — C760 Malignant neoplasm of head, face and neck: Secondary | ICD-10-CM | POA: Diagnosis not present

## 2014-08-26 NOTE — Patient Instructions (Signed)
Over Head Pull: Narrow Grip     [place] [phone]   On back, knees bent, feet flat, band across thighs, elbows straight but relaxed. Pull hands apart (start). Keeping elbows straight, bring arms up and over head, hands toward floor. Keep pull steady on band. Hold momentarily. Return slowly, keeping pull steady, back to start. Repeat __10_ times. Band color ___yellow ___   Side Pull: Double Arm   On back, knees bent, feet flat. Arms perpendicular to body, shoulder level, elbows straight but relaxed. Pull arms out to sides, elbows straight. Resistance band comes across collarbones, hands toward floor. Hold momentarily. Slowly return to starting position. Repeat _10__ times. Band color __yellow___   Sash   On back, knees bent, feet flat, left hand on left hip, right hand above left. Pull right arm DIAGONALLY (hip to shoulder) across chest. Bring right arm along head toward floor. Hold momentarily. Slowly return to starting position. Repeat __10_ times. Do with left arm. Band color ___yellow__   Shoulder Rotation: Double Arm   On back, knees bent, feet flat, elbows tucked at sides, bent 90, hands palms up. Pull hands apart and down toward floor, keeping elbows near sides. Hold momentarily. Slowly return to starting position. Repeat _10__ times. Band color __yellow____    

## 2014-08-26 NOTE — Therapy (Signed)
[place], [place], [address] Phone: [phone]   Fax:  [phone]  Physical Therapy Treatment  Patient Details  Name: Dylan Moreno MRN: [id number] Date of Birth: March 19, 1940 Referring Provider:  Heath Lark, MD  Encounter Date: 08/26/2014      PT End of Session - 08/26/14 1754    Visit Number 5   Number of Visits 16   Date for PT Re-Evaluation 10/07/14   PT Start Time 1350   PT Stop Time 1433   PT Time Calculation (min) 43 min      Past Medical History  Diagnosis Date  . Hyperlipemia   . Arthritis   . ADHD (attention deficit hyperactivity disorder)   . Wears glasses   . Hypercholesterolemia   . Peyronie's disease   . Kidney stones   . TIA (transient ischemic attack)   . Hypertension   . Anxiety   . GERD (gastroesophageal reflux disease)   . 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 TIA (transient ischemic attack) 12/29/12    left facial droop  . Dysuria 07/15/2014    Past Surgical History  Procedure Laterality Date  . Dupuytren contracture release  2012    left hand  . Tonsillectomy    . Shoulder arthroscopy w/ rotator cuff repair      left  . Colonoscopy    . Cystoscopy w/ ureteroscopy w/ lithotripsy  2011  . Hand surgery      right  . Dupuytren contracture release Right 10/30/2012    Procedure: DUPUYTREN CONTRACTURE RELEASE RIGHT PALM,RING AND SMALL FINGER;  Surgeon: Cammie Sickle., MD;  Location: [place] ;  Service: Orthopedics;  Laterality: Right;    There were no vitals filed for this visit.  Visit Diagnosis:  Lymphedema  Poor posture      Subjective Assessment - 08/26/14 1752    Subjective Pt continues with multiple questions about treatment and says he wants to do what he can. He says he does not know how to wear the compression wrap/chip pack     Pertinent History Diagnosed 3/16 with cancer on base of tongue. He  underwent chemotherapy and radiation and completed that on 05/25/14.  He reports swelling in his neck began after radiation around 06/02/14.   Currently in Pain? No/denies                         [facility] Adult PT Treatment/Exercise - 08/26/14 0001    Self-Care   Self-Care Other Self-Care Comments   Other Self-Care Comments  showed pt Jovipak neck garment and he is interested ang gave permission to send face sheet to [place].  caregiver agreed    Neck Exercises: Seated   Other Seated Exercise cervical range of motion   Shoulder Exercises: Supine   Other Supine Exercises supine scapulat series with yetllow theraband x 10 reps   Manual Therapy   Manual Lymphatic Drainage (MLD) short neck, superficial and deep abdominals, right axillary and pectoral nodes, samd on left side, chest and posterior neck anterior throat, cheeks, periauricular area, submandibular nodes                 PT Education - 08/26/14 1753    Education provided Yes   Education Details supine scapular exercise with yellow theraband   Person(s) Educated Patient   Methods Explanation;Demonstration;Handout;Tactile cues   Comprehension Verbalized understanding;Returned demonstration;Need further instruction  Short Term Clinic Goals - 08/18/14 2124    CC Short Term Goal  #3   Title Patient's wife or caregiver will demonstrate proper technique for doing manual lymph drainage with some verbal and tactile cues.   Status Achieved             Long Term Clinic Goals - 08/12/14 1311    CC Long Term Goal  #1   Title Patient will be able to demonstrate proper technique with final HEP and verbalize safe self progression to increase cervical ROM for promoting lymphatic circulation   Time 8   Period Weeks   Status New   CC Long Term Goal  #2   Title Patient will demonstrate a reduction in neck swelling by >/= 1 cm at 8 cm proximal to sternal notch around neck   CC Long Term Goal  #3   Title  Patient's wife or caregiver will demonstrate proper technique for doing manual lymph drainage without verbal and tactile cues.   CC Long Term Goal  #4   Title Patient and his wife/caregiver will verbalize where and how to be fitted for a head and neck compression garment if they decide to proceed with that.   Time 8   Period Days   Status New         Head and Neck Clinic Goals - 03/17/14 2056    Patient will be able to verbalize understanding of a home exercise program for cervical range of motion, posture, and walking.    Status Achieved   Patient will be able to verbalize understanding of proper sitting and standing posture.    Status Achieved   Patient will be able to verbalize understanding of lymphedema risk and availability of treatment for this condition.    Status Achieved           Plan - 08/26/14 1754    Clinical Impression Statement Pt does not seem to be following though with manual lymph drainage or compression use at home.  His caregiver asked if there was one that is easier to put on and both would like to find out about getting a jovipak. His lymphedema was softer after treatmentt   Clinical Impairments Affecting Rehab Potential Poor memory and attention   PT Next Visit Plan Continue manual lymph drainage and instruction. continue with scapular strengthening and posture exercise         Problem List Patient Active Problem List   Diagnosis Date Noted  . 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 Dysuria 07/15/2014  . Dysphagia 06/17/2014  . Drug-induced memory loss 06/17/2014  . Cerebral ventriculomegaly 06/08/2014  . PEG (percutaneous endoscopic gastrostomy) status 06/08/2014  . Alcohol abuse 06/08/2014  . Dilated pupil 06/04/2014  . Aspiration into airway   . Acute delirium 06/02/2014  . Tongue swelling   . Hyponatremia   . Protein-calorie malnutrition, severe 05/26/2014  . Subacute delirium 05/25/2014  . Anemia in neoplastic disease  05/20/2014  . Confusion caused by a drug 05/20/2014  . Protein calorie malnutrition 05/20/2014  . Insomnia 05/10/2014  . Leukopenia due to antineoplastic chemotherapy 04/28/2014  . Mucositis due to chemotherapy 04/22/2014  . Dehydration 04/22/2014  . Weight loss 04/14/2014  . Chemotherapy-induced nausea 04/14/2014  . 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  . TIA (transient ischemic attack) 12/29/2012  . Hyperlipemia   . ADHD (attention deficit hyperactivity disorder)    Donato Heinz. Owens Shark, PT  08/26/2014, 5:58 PM  [place], [place], [address] Phone: [phone]   Fax:  [phone]

## 2014-08-26 NOTE — Telephone Encounter (Signed)
  Oncology Nurse Navigator Documentation    Navigator Encounter Type: Telephone (08/26/14 1320)         Interventions: Coordination of Care (08/26/14 1320)         Patient's wife called to report that end of PEG where syringe attaches appears torn, ready to come off.  She noted he continues to rely on PEG for primary route of nutrition. I called [facility] IR to arrange evaluation, was directed to [facility], arranged 0800 appt tomorrow morning.   I notified wife, she verbalized understanding.  Gayleen Orem, RN, BSN, [place] [facility] [place] [phone]      Time Spent with Patient: 30 (08/26/14 1320)

## 2014-08-27 ENCOUNTER — Other Ambulatory Visit: Payer: Self-pay | Admitting: Hematology and Oncology

## 2014-08-27 ENCOUNTER — Telehealth: Payer: Self-pay | Admitting: Nutrition

## 2014-08-27 ENCOUNTER — Ambulatory Visit (HOSPITAL_COMMUNITY)
Admission: RE | Admit: 2014-08-27 | Discharge: 2014-08-27 | Disposition: A | Discharge status: Home / Self Care | Payer: Medicare Other | Source: Ambulatory Visit | Attending: Hematology and Oncology | Admitting: Hematology and Oncology

## 2014-08-27 DIAGNOSIS — R1310 Dysphagia, unspecified: Secondary | ICD-10-CM | POA: Insufficient documentation

## 2014-08-27 DIAGNOSIS — M6281 Muscle weakness (generalized): Secondary | ICD-10-CM | POA: Diagnosis not present

## 2014-08-27 DIAGNOSIS — Z431 Encounter for attention to gastrostomy: Secondary | ICD-10-CM | POA: Diagnosis not present

## 2014-08-27 DIAGNOSIS — R262 Difficulty in walking, not elsewhere classified: Secondary | ICD-10-CM | POA: Diagnosis not present

## 2014-08-27 NOTE — Progress Notes (Signed)
Pt arrived in Radiology with complaints of broken hub on 20 french pull thru G-tube.  A new hub was placed.  G-tube was intact.

## 2014-08-27 NOTE — Telephone Encounter (Signed)
Patient left a message that he had questions related to eating/oral intake. Contacted patient at home who reports that he does not have his questions with him. He would like to call me back later date to discuss his questions over the phone. He has my contact information.

## 2014-08-30 ENCOUNTER — Ambulatory Visit: Payer: Medicare Other | Admitting: Physical Therapy

## 2014-08-30 ENCOUNTER — Encounter: Payer: Self-pay | Admitting: Physical Therapy

## 2014-08-30 DIAGNOSIS — I890 Lymphedema, not elsewhere classified: Secondary | ICD-10-CM

## 2014-08-30 DIAGNOSIS — M6281 Muscle weakness (generalized): Secondary | ICD-10-CM | POA: Diagnosis not present

## 2014-08-30 DIAGNOSIS — M436 Torticollis: Secondary | ICD-10-CM

## 2014-08-30 DIAGNOSIS — R293 Abnormal posture: Secondary | ICD-10-CM

## 2014-08-30 DIAGNOSIS — R1310 Dysphagia, unspecified: Secondary | ICD-10-CM | POA: Diagnosis not present

## 2014-08-30 DIAGNOSIS — R262 Difficulty in walking, not elsewhere classified: Secondary | ICD-10-CM | POA: Diagnosis not present

## 2014-08-30 NOTE — Therapy (Signed)
[place], [place], [address] Phone: [phone]   Fax:  [phone]  Physical Therapy Treatment  Patient Details  Name: Dylan Moreno MRN: [id number] Date of Birth: 12-Dec-1940 Referring Provider:  Heath Lark, MD  Encounter Date: 08/30/2014      PT End of Session - 08/30/14 1439    Visit Number 6   Number of Visits 16   Date for PT Re-Evaluation 10/07/14   PT Start Time 6283   PT Stop Time 1435   PT Time Calculation (min) 47 min   Activity Tolerance Patient tolerated treatment well   Behavior During Therapy [facility] for tasks assessed/performed      Past Medical History  Diagnosis Date  . Hyperlipemia   . Arthritis   . ADHD (attention deficit hyperactivity disorder)   . Wears glasses   . Hypercholesterolemia   . Peyronie's disease   . Kidney stones   . TIA (transient ischemic attack)   . Hypertension   . Anxiety   . GERD (gastroesophageal reflux disease)   . 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 TIA (transient ischemic attack) 12/29/12    left facial droop  . Dysuria 07/15/2014    Past Surgical History  Procedure Laterality Date  . Dupuytren contracture release  2012    left hand  . Tonsillectomy    . Shoulder arthroscopy w/ rotator cuff repair      left  . Colonoscopy    . Cystoscopy w/ ureteroscopy w/ lithotripsy  2011  . Hand surgery      right  . Dupuytren contracture release Right 10/30/2012    Procedure: DUPUYTREN CONTRACTURE RELEASE RIGHT PALM,RING AND SMALL FINGER;  Surgeon: Cammie Sickle., MD;  Location: [place];  Service: Orthopedics;  Laterality: Right;    There were no vitals filed for this visit.  Visit Diagnosis:  Lymphedema  Poor posture  Stiffness of neck      Subjective Assessment - 08/30/14 1347    Subjective I've been working on the massage but I don't feel like it's getting better.   Patient is  accompained by: Family member   Pertinent History Diagnosed 3/16 with cancer on base of tongue. He underwent chemotherapy and radiation and completed that on 05/25/14.  He reports swelling in his neck began after radiation around 06/02/14.   Patient Stated Goals reduce swelling in his neck   Currently in Pain? No/denies                         [facility] Adult PT Treatment/Exercise - 08/30/14 0001    Manual Therapy   Manual Therapy Manual Lymphatic Drainage (MLD)   Manual Lymphatic Drainage (MLD) short neck, superficial and deep abdominals, right axillary and pectoral nodes, same on left side, chest and posterior neck anterior throat, cheeks, periauricular area, submandibular nodes. Increased time spent on anterior neck where he has the most visibly prominent swelling.                PT Education - 08/30/14 1435    Education provided Yes   Education Details Discussed trying to increase time walking, decrease time sleeping, and increase eating to get back to more normal energy levels.   Person(s) Educated Patient;Spouse   Methods Explanation   Comprehension Verbalized understanding           Short Term Clinic Goals - 08/18/14 2124  CC Short Term Goal  #3   Title Patient's wife or caregiver will demonstrate proper technique for doing manual lymph drainage with some verbal and tactile cues.   Status Achieved             Long Term Clinic Goals - 08/12/14 1311    CC Long Term Goal  #1   Title Patient will be able to demonstrate proper technique with final HEP and verbalize safe self progression to increase cervical ROM for promoting lymphatic circulation   Time 8   Period Weeks   Status New   CC Long Term Goal  #2   Title Patient will demonstrate a reduction in neck swelling by >/= 1 cm at 8 cm proximal to sternal notch around neck   CC Long Term Goal  #3   Title Patient's wife or caregiver will demonstrate proper technique for doing manual lymph drainage  without verbal and tactile cues.   CC Long Term Goal  #4   Title Patient and his wife/caregiver will verbalize where and how to be fitted for a head and neck compression garment if they decide to proceed with that.   Time 8   Period Days   Status New         Head and Neck Clinic Goals - 03/17/14 2056    Patient will be able to verbalize understanding of a home exercise program for cervical range of motion, posture, and walking.    Status Achieved   Patient will be able to verbalize understanding of proper sitting and standing posture.    Status Achieved   Patient will be able to verbalize understanding of lymphedema risk and availability of treatment for this condition.    Status Achieved           Plan - 08/30/14 1439    Clinical Impression Statement Patient continues to have swelling present in his neck which may be effecting swallowing and is bothersome to him.  He may benefit from a compression garment and discussed that today.  He will benefit from continued PT to try to reduce swelling.  He is going to another PT clinic for work on posture, balance and strength so we will not work on that here.   Pt will benefit from skilled therapeutic intervention in order to improve on the following deficits Increased edema;Decreased range of motion;Postural dysfunction   Rehab Potential Good   Clinical Impairments Affecting Rehab Potential Poor memory and attention   PT Frequency 2x / week   PT Duration 8 weeks   PT Treatment/Interventions Manual lymph drainage;Patient/family education   PT Next Visit Plan Continue manual lymph drainage and instruction. We will not work on strength, balance, posture due to patient receiving PT elsewhere for that.   PT Home Exercise Plan manual lymph drainage daily   Consulted and Agree with Plan of Care Patient;Family member/caregiver   Family Member Consulted Wife        Problem List Patient Active Problem List   Diagnosis Date  Noted  . 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 Dysuria 07/15/2014  . Dysphagia 06/17/2014  . Drug-induced memory loss 06/17/2014  . Cerebral ventriculomegaly 06/08/2014  . PEG (percutaneous endoscopic gastrostomy) status 06/08/2014  . Alcohol abuse 06/08/2014  . Dilated pupil 06/04/2014  . Aspiration into airway   . Acute delirium 06/02/2014  . Tongue swelling   . Hyponatremia   . Protein-calorie malnutrition, severe 05/26/2014  . Subacute delirium 05/25/2014  . 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  Confusion caused by a drug 05/20/2014  . Protein calorie malnutrition 05/20/2014  . Insomnia 05/10/2014  . Leukopenia due to antineoplastic chemotherapy 04/28/2014  . Mucositis due to chemotherapy 04/22/2014  . Dehydration 04/22/2014  . Weight loss 04/14/2014  . Chemotherapy-induced nausea 04/14/2014  . 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  . TIA (transient ischemic attack) 12/29/2012  . Hyperlipemia   . ADHD (attention deficit hyperactivity disorder)    Annia Friendly, PT 08/30/2014 2:44 PM  [place], [place], [address] Phone: [phone]   Fax:  [phone]

## 2014-08-30 NOTE — Procedures (Signed)
Cut hub of G tube and replaced.

## 2014-08-31 ENCOUNTER — Ambulatory Visit: Payer: Medicare Other | Admitting: Nutrition

## 2014-08-31 NOTE — Progress Notes (Signed)
Nutrition follow-up completed with patient and a close friend. Patient completed treatment for cancer the end of May 2016. Patient is consuming 6 cans daily of ensure or Glucerna 1.5 by mouth. Patient is beginning to eat small amounts of some foods such as scallops, meat sauce, and onion soup.   He complains of early satiety and taste alterations. He has no appetite.  States food does not taste good to him. He denies problems chewing and swallowing however he has a very dry mouth.   He would like strategies to increase oral intake of foods. Weight documented as 167.2 pounds today which is generally stable over the past 3 months.  Nutrition diagnosis: Inadequate oral intake continues but has improved.  Intervention:  Educated patient on increasing soft moist foods and trying to eat small amounts at breakfast, lunch and dinner. Educated patient on strategies for improving taste. Educated patient he must try to eat despite poor appetite. Provided fact sheets. Recommended patient continue 6 cans daily of oral nutrition supplements to prevent further weight loss. Educated patient that we can begin to reduce oral supplements once patient's oral intake of foods increases. Encouraged patient to prepare some of his own foods as he was once a very good cook and enjoyed this. Questions were answered.  Teach back method used.  Monitoring, evaluation, goals: Patient will increase oral intake food at mealtimes and continue oral nutrition supplements to promote weight maintenance/gain.  Next visit: Patient has my contact information.  **Disclaimer: This note was dictated with voice recognition software. Similar sounding words can inadvertently be transcribed and this note may contain transcription errors which may not have been corrected upon publication of note.**

## 2014-09-01 ENCOUNTER — Ambulatory Visit (INDEPENDENT_AMBULATORY_CARE_PROVIDER_SITE_OTHER): Payer: Medicare Other | Admitting: Clinical

## 2014-09-01 ENCOUNTER — Encounter (HOSPITAL_COMMUNITY): Payer: Self-pay | Admitting: Clinical

## 2014-09-01 DIAGNOSIS — F99 Mental disorder, not otherwise specified: Secondary | ICD-10-CM

## 2014-09-01 DIAGNOSIS — R262 Difficulty in walking, not elsewhere classified: Secondary | ICD-10-CM | POA: Diagnosis not present

## 2014-09-01 DIAGNOSIS — M6281 Muscle weakness (generalized): Secondary | ICD-10-CM | POA: Diagnosis not present

## 2014-09-01 DIAGNOSIS — F909 Attention-deficit hyperactivity disorder, unspecified type: Secondary | ICD-10-CM | POA: Diagnosis not present

## 2014-09-01 DIAGNOSIS — R419 Unspecified symptoms and signs involving cognitive functions and awareness: Secondary | ICD-10-CM

## 2014-09-01 NOTE — Progress Notes (Signed)
   THERAPIST PROGRESS NOTE  Session Time: 3:34 - 4:30  Participation Level: Active  Behavioral Response: CasualAlert and ConfusedNA  Type of Therapy: Individual Therapy  Treatment Goals addressed: improve psychiatric symptoms  Interventions: Motivational Interviewing  Summary: Dylan Moreno is a 74 y.o. male who presents with Axis I: Neurocognitive disorder, unspecified, Attention deficit hyperactivity disorder (ADHD), unspecified ADHD type .   Suicidal/Homicidal: Nowithout intent/plan  Therapist Response: Dylan Moreno met with clinician for an individual session. 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 Dylan Moreno shared that he didn't think he understood why he was here. Dylan Moreno was very coherent and alert.  and He shared that he knew he had memory problems but everything was fine. His wife and business partner  disagreed. They reported that it wasn't so much the memory issues but Dylan Moreno's resistance to doing the things he needs to do to maintain and improve his health. His wife and partner shared about the things that they have found most challenging. Dylan Moreno said that he did not see those things as a problem. Dylan Moreno used to be responsible for taking his own medication or eating or drinking water. He is unable to remember to do so. The women report that they have to fuss and fuss at him before he will do it. Then he is upset that he is being fussed at. Dylan Moreno, his wife and business partner and clinician discussed some possible solutions to help Dylan Moreno to remember tasks such as alarms, white board, daily list (including sending him to a memory specialist). Dylan Moreno was somewhat resistant and though he said they could try some of the solutions it was unclear which one he would be willing to try. Clinician asked open ended questions about  what motivates him. He shared hislove for his bussines. Client and clinician discussed how he could use that as a motivator to do those things his wife and  partner suggested.   Plan: Return again in 1 weeks.  Diagnosis: Axis I: Neurocognitive disorder, unspecified, Attention deficit hyperactivity disorder (ADHD), unspecified ADHD type     Jeselle Hiser A, LCSW 09/01/2014

## 2014-09-02 ENCOUNTER — Ambulatory Visit: Payer: Medicare Other | Attending: Hematology and Oncology | Admitting: Physical Therapy

## 2014-09-02 ENCOUNTER — Encounter: Payer: Self-pay | Admitting: Physical Therapy

## 2014-09-02 DIAGNOSIS — Z9189 Other specified personal risk factors, not elsewhere classified: Secondary | ICD-10-CM | POA: Insufficient documentation

## 2014-09-02 DIAGNOSIS — I890 Lymphedema, not elsewhere classified: Secondary | ICD-10-CM | POA: Insufficient documentation

## 2014-09-02 DIAGNOSIS — M436 Torticollis: Secondary | ICD-10-CM | POA: Insufficient documentation

## 2014-09-02 DIAGNOSIS — R1310 Dysphagia, unspecified: Secondary | ICD-10-CM | POA: Diagnosis not present

## 2014-09-02 DIAGNOSIS — R293 Abnormal posture: Secondary | ICD-10-CM | POA: Insufficient documentation

## 2014-09-02 NOTE — Therapy (Signed)
Dylan Moreno, [place], [address] Phone: [phone]   Fax:  [phone]  Physical Therapy Treatment  Patient Details  Name: Dylan Moreno MRN: [id number] Date of Birth: 29-Apr-1940 Referring Provider:  Heath Lark, MD  Encounter Date: 09/02/2014      PT End of Session - 09/02/14 1234    Visit Number 7   Number of Visits 16   Date for PT Re-Evaluation 10/07/14   PT Start Time 1102   PT Stop Time 1156   PT Time Calculation (min) 54 min   Activity Tolerance Patient tolerated treatment well   Behavior During Therapy [facility] for tasks assessed/performed      Past Medical History  Diagnosis Date  . Hyperlipemia   . Arthritis   . ADHD (attention deficit hyperactivity disorder)   . Wears glasses   . Hypercholesterolemia   . Peyronie's disease   . Kidney stones   . TIA (transient ischemic attack)   . Hypertension   . Anxiety   . GERD (gastroesophageal reflux disease)   . 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 TIA (transient ischemic attack) 12/29/12    left facial droop  . Dysuria 07/15/2014    Past Surgical History  Procedure Laterality Date  . Dupuytren contracture release  2012    left hand  . Tonsillectomy    . Shoulder arthroscopy w/ rotator cuff repair      left  . Colonoscopy    . Cystoscopy w/ ureteroscopy w/ lithotripsy  2011  . Hand surgery      right  . Dupuytren contracture release Right 10/30/2012    Procedure: DUPUYTREN CONTRACTURE RELEASE RIGHT PALM,RING AND SMALL FINGER;  Surgeon: Dylan Moreno., MD;  Location: [facility];  Service: Orthopedics;  Laterality: Right;    There were no vitals filed for this visit.  Visit Diagnosis:  Lymphedema  Poor posture      Subjective Assessment - 09/02/14 1107    Subjective Met with a dietician and I'm working on trying to eat more.     Pertinent History Diagnosed 3/16 with cancer on base of  tongue. He underwent chemotherapy and radiation and completed that on 05/25/14.  He reports swelling in his neck began after radiation around 06/02/14.   Currently in Pain? No/denies               LYMPHEDEMA/ONCOLOGY QUESTIONNAIRE - 09/02/14 1107    Head and Neck   4 cm superior to sternal notch around neck 43.5 cm   6 cm superior to sternal notch around neck 45.5 cm   8 cm superior to sternal notch around neck 47.8 cm   Other 57                  [facility] Adult PT Treatment/Exercise - 09/02/14 0001    Manual Therapy   Manual Therapy Manual Lymphatic Drainage (MLD)   Manual Lymphatic Drainage (MLD) short neck, superficial and deep abdominals, right axillary and pectoral nodes, same on left side, chest and posterior neck anterior throat, cheeks, periauricular area, submandibular nodes. Increased time spent on anterior neck where he has the most visibly prominent swelling.                PT Education - 09/02/14 1220    Education provided Yes   Education Details Showed pt and his [name] the Dylan Moreno for the neck to reduce swelling; they  both said they would like it. Demographics sent to Dylan Moreno   Dylan Moreno) Educated Patient;Child(ren)   Methods Explanation   Comprehension Verbalized understanding           Short Term Clinic Goals - 09/02/14 1241    CC Short Term Goal  #1   Title Patient will be able to demonstrate proper technique with initial HEP to increase cervical ROM for promoting lymphatic circulation   Time 4   Period Weeks   Status On-going   CC Short Term Goal  #2   Title Patient will demonstrate a reduction in neck swelling by >/= 0.5 cm at 8 cm proximal to sternal notch around neck   Time 4   Period Weeks   Status On-going   CC Short Term Goal  #3   Title Patient's wife or caregiver will demonstrate proper technique for doing manual lymph drainage with some verbal and tactile cues.   Time 4   Period Weeks   Status On-going              Long Term Clinic Goals - 08/12/14 1311    CC Long Term Goal  #1   Title Patient will be able to demonstrate proper technique with final HEP and verbalize safe self progression to increase cervical ROM for promoting lymphatic circulation   Time 8   Period Weeks   Status New   CC Long Term Goal  #2   Title Patient will demonstrate a reduction in neck swelling by >/= 1 cm at 8 cm proximal to sternal notch around neck   CC Long Term Goal  #3   Title Patient's wife or caregiver will demonstrate proper technique for doing manual lymph drainage without verbal and tactile cues.   CC Long Term Goal  #4   Title Patient and his wife/caregiver will verbalize where and how to be fitted for a head and neck compression garment if they decide to proceed with that.   Time 8   Period Days   Status New         Head and Neck Clinic Goals - 03/17/14 2056    Patient will be able to verbalize understanding of a home exercise program for cervical range of motion, posture, and walking.    Status Achieved   Patient will be able to verbalize understanding of proper sitting and standing posture.    Status Achieved   Patient will be able to verbalize understanding of lymphedema risk and availability of treatment for this condition.    Status Achieved           Plan - 09/02/14 1234    Clinical Impression Statement Discussed again at length the importance of walking more and sleeping less, drinking enough water, and doing manual lymph drainage.  Patient's son had many questions related to lymphedema and recommended treatment which were answered.  Information was given for on another PT who is a massage therapist who can do manual lymph drainage long term after discharge.   His swelling was significantly worse today for unknown reasons but responded well to treatment and looked and felt less swollen after treatment.   Pt will benefit from skilled therapeutic intervention in  order to improve on the following deficits Increased edema;Decreased range of motion;Postural dysfunction   Rehab Potential Good   Clinical Impairments Affecting Rehab Potential Poor memory and attention   PT Frequency 2x / week   PT Duration 8 weeks   PT Treatment/Interventions Manual lymph drainage;Patient/family education  PT Next Visit Plan Continue manual lymph drainage and instruction. We will not work on strength, balance, posture due to patient receiving PT elsewhere for that.   PT Home Exercise Plan manual lymph drainage daily   Consulted and Agree with Plan of Care Patient;Family member/caregiver   Family Member Consulted Son Bena        Problem List Patient Active Problem List   Diagnosis Date Noted  . 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 Dysuria 07/15/2014  . Dysphagia 06/17/2014  . Drug-induced memory loss 06/17/2014  . Cerebral ventriculomegaly 06/08/2014  . PEG (percutaneous endoscopic gastrostomy) status 06/08/2014  . Alcohol abuse 06/08/2014  . Dilated pupil 06/04/2014  . Aspiration into airway   . Acute delirium 06/02/2014  . Tongue swelling   . Hyponatremia   . Protein-calorie malnutrition, severe 05/26/2014  . Subacute delirium 05/25/2014  . 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 Confusion caused by a drug 05/20/2014  . Protein calorie malnutrition 05/20/2014  . Insomnia 05/10/2014  . Leukopenia due to antineoplastic chemotherapy 04/28/2014  . Mucositis due to chemotherapy 04/22/2014  . Dehydration 04/22/2014  . Weight loss 04/14/2014  . Chemotherapy-induced nausea 04/14/2014  . 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  . TIA (transient ischemic attack) 12/29/2012  . Hyperlipemia   . ADHD (attention deficit hyperactivity disorder)    Annia Friendly, PT 09/02/2014 12:42 PM  [place], [place], [address] Phone:  [phone]   Fax:  [phone]

## 2014-09-03 DIAGNOSIS — R262 Difficulty in walking, not elsewhere classified: Secondary | ICD-10-CM | POA: Diagnosis not present

## 2014-09-03 DIAGNOSIS — M6281 Muscle weakness (generalized): Secondary | ICD-10-CM | POA: Diagnosis not present

## 2014-09-07 ENCOUNTER — Telehealth: Payer: Self-pay | Admitting: *Deleted

## 2014-09-07 ENCOUNTER — Ambulatory Visit: Payer: Medicare Other | Admitting: Physical Therapy

## 2014-09-07 DIAGNOSIS — I890 Lymphedema, not elsewhere classified: Secondary | ICD-10-CM

## 2014-09-07 DIAGNOSIS — R262 Difficulty in walking, not elsewhere classified: Secondary | ICD-10-CM | POA: Diagnosis not present

## 2014-09-07 DIAGNOSIS — M436 Torticollis: Secondary | ICD-10-CM | POA: Diagnosis not present

## 2014-09-07 DIAGNOSIS — Z9189 Other specified personal risk factors, not elsewhere classified: Secondary | ICD-10-CM | POA: Diagnosis not present

## 2014-09-07 DIAGNOSIS — R293 Abnormal posture: Secondary | ICD-10-CM | POA: Diagnosis not present

## 2014-09-07 DIAGNOSIS — M6281 Muscle weakness (generalized): Secondary | ICD-10-CM | POA: Diagnosis not present

## 2014-09-07 DIAGNOSIS — R1310 Dysphagia, unspecified: Secondary | ICD-10-CM | POA: Diagnosis not present

## 2014-09-07 NOTE — Telephone Encounter (Signed)
Patients wife called she would like to speak to someone about his change in behavior before his follow up appointment Call back number [phone] Vicente Males)

## 2014-09-07 NOTE — Therapy (Signed)
[place], [place], [address] Phone: [phone]   Fax:  [phone]  Physical Therapy Treatment  Patient Details  Name: Dylan Moreno MRN: [id number] Date of Birth: Jul 13, 1940 Referring Provider:  Heath Lark, MD  Encounter Date: 09/07/2014      PT End of Session - 09/07/14 1721    Visit Number 8   Number of Visits 16   Date for PT Re-Evaluation 10/07/14   PT Start Time 3338   PT Stop Time 1518   PT Time Calculation (min) 40 min   Activity Tolerance Patient tolerated treatment well   Behavior During Therapy [facility] for tasks assessed/performed      Past Medical History  Diagnosis Date  . Hyperlipemia   . Arthritis   . ADHD (attention deficit hyperactivity disorder)   . Wears glasses   . Hypercholesterolemia   . Peyronie's disease   . Kidney stones   . TIA (transient ischemic attack)   . Hypertension   . Anxiety   . GERD (gastroesophageal reflux disease)   . 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 TIA (transient ischemic attack) 12/29/12    left facial droop  . Dysuria 07/15/2014    Past Surgical History  Procedure Laterality Date  . Dupuytren contracture release  2012    left hand  . Tonsillectomy    . Shoulder arthroscopy w/ rotator cuff repair      left  . Colonoscopy    . Cystoscopy w/ ureteroscopy w/ lithotripsy  2011  . Hand surgery      right  . Dupuytren contracture release Right 10/30/2012    Procedure: DUPUYTREN CONTRACTURE RELEASE RIGHT PALM,RING AND SMALL FINGER;  Surgeon: Cammie Sickle., MD;  Location: [facility];  Service: Orthopedics;  Laterality: Right;    There were no vitals filed for this visit.  Visit Diagnosis:  Lymphedema      Subjective Assessment - 09/07/14 1439    Subjective "I haven't spoken to that lady yet" (about the garment)   Currently in Pain? No/denies               LYMPHEDEMA/ONCOLOGY  QUESTIONNAIRE - 09/07/14 1441    Head and Neck   4 cm superior to sternal notch around neck 44.6 cm  44.1 post MLD   6 cm superior to sternal notch around neck 45.3 cm  44.2 post MLD   8 cm superior to sternal notch around neck 45.7 cm  45.2 post MLD   Other 56.5 cm. around head above lip and below nose/ears  around head above lip and below ears                  [facility] Adult PT Treatment/Exercise - 09/07/14 0001    Manual Therapy   Edema Management Circumference measurements taken pre- and posttreatment   Manual Lymphatic Drainage (MLD) diaphragmatic breathing, supraclavicular fossae, bilateral shoulder collectors, bilateral axillae and anterior chest; lateral, anterolateral, and anterior neck; posterior neck, chin and cheeks.                   Short Term Clinic Goals - 09/02/14 1241    CC Short Term Goal  #1   Title Patient will be able to demonstrate proper technique with initial HEP to increase cervical ROM for promoting lymphatic circulation   Time 4   Period Weeks   Status On-going   CC Short Term Goal  #  2   Title Patient will demonstrate a reduction in neck swelling by >/= 0.5 cm at 8 cm proximal to sternal notch around neck   Time 4   Period Weeks   Status On-going   CC Short Term Goal  #3   Title Patient's wife or caregiver will demonstrate proper technique for doing manual lymph drainage with some verbal and tactile cues.   Time 4   Period Weeks   Status On-going             Long Term Clinic Goals - 08/12/14 1311    CC Long Term Goal  #1   Title Patient will be able to demonstrate proper technique with final HEP and verbalize safe self progression to increase cervical ROM for promoting lymphatic circulation   Time 8   Period Weeks   Status New   CC Long Term Goal  #2   Title Patient will demonstrate a reduction in neck swelling by >/= 1 cm at 8 cm proximal to sternal notch around neck   CC Long Term Goal  #3   Title Patient's wife or  caregiver will demonstrate proper technique for doing manual lymph drainage without verbal and tactile cues.   CC Long Term Goal  #4   Title Patient and his wife/caregiver will verbalize where and how to be fitted for a head and neck compression garment if they decide to proceed with that.   Time 8   Period Days   Status New         Head and Neck Clinic Goals - 03/17/14 2056    Patient will be able to verbalize understanding of a home exercise program for cervical range of motion, posture, and walking.    Status Achieved   Patient will be able to verbalize understanding of proper sitting and standing posture.    Status Achieved   Patient will be able to verbalize understanding of lymphedema risk and availability of treatment for this condition.    Status Achieved           Plan - 09/07/14 1721    Clinical Impression Statement patient with some slight improvement in circumference measurements pre-treatment today compared to last visit, and then further improvement post-treatment.  Encouraged use of chip pack.  Pt. says he hasn't heard from garment vendor re: Suzan Garibaldi.   Pt will benefit from skilled therapeutic intervention in order to improve on the following deficits Increased edema;Decreased range of motion;Postural dysfunction   Rehab Potential Good   PT Frequency 2x / week   PT Duration 8 weeks   PT Treatment/Interventions Manual lymph drainage;Manual techniques   PT Next Visit Plan Continue manual lymph drainage and instruction. We will not work on strength, balance, posture due to patient receiving PT elsewhere for that.   Consulted and Agree with Plan of Care Patient        Problem List Patient Active Problem List   Diagnosis Date Noted  . 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 Dysuria 07/15/2014  . Dysphagia 06/17/2014  . Drug-induced memory loss 06/17/2014  . Cerebral ventriculomegaly 06/08/2014  . PEG (percutaneous endoscopic gastrostomy) status  06/08/2014  . Alcohol abuse 06/08/2014  . Dilated pupil 06/04/2014  . Aspiration into airway   . Acute delirium 06/02/2014  . Tongue swelling   . Hyponatremia   . Protein-calorie malnutrition, severe 05/26/2014  . Subacute delirium 05/25/2014  . 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 Confusion caused by a drug 05/20/2014  . Protein  calorie malnutrition 05/20/2014  . Insomnia 05/10/2014  . Leukopenia due to antineoplastic chemotherapy 04/28/2014  . Mucositis due to chemotherapy 04/22/2014  . Dehydration 04/22/2014  . Weight loss 04/14/2014  . Chemotherapy-induced nausea 04/14/2014  . 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  . TIA (transient ischemic attack) 12/29/2012  . Hyperlipemia   . ADHD (attention deficit hyperactivity disorder)     Marcus Groll 09/07/2014, 5:24 PM  [place], [place], [address] Phone: [phone]   Fax:  [place], PT 09/07/2014 5:24 PM

## 2014-09-07 NOTE — Telephone Encounter (Signed)
Called wife back and she said that his memory is really bad.  If he is not pushed to do anything then he won't do anything.  His wife would like further testing.  Can we refer to neuropsych?

## 2014-09-08 ENCOUNTER — Ambulatory Visit (HOSPITAL_COMMUNITY): Payer: Self-pay | Admitting: Clinical

## 2014-09-08 DIAGNOSIS — M6281 Muscle weakness (generalized): Secondary | ICD-10-CM | POA: Diagnosis not present

## 2014-09-08 DIAGNOSIS — R262 Difficulty in walking, not elsewhere classified: Secondary | ICD-10-CM | POA: Diagnosis not present

## 2014-09-08 NOTE — Telephone Encounter (Signed)
Referral sent 

## 2014-09-08 NOTE — Telephone Encounter (Signed)
Okay to refer to neuropsychiatry.  Thanks.

## 2014-09-09 ENCOUNTER — Ambulatory Visit: Payer: Medicare Other | Admitting: Physical Therapy

## 2014-09-09 ENCOUNTER — Encounter: Payer: Self-pay | Admitting: Physical Therapy

## 2014-09-09 DIAGNOSIS — I890 Lymphedema, not elsewhere classified: Secondary | ICD-10-CM

## 2014-09-09 DIAGNOSIS — Z9189 Other specified personal risk factors, not elsewhere classified: Secondary | ICD-10-CM | POA: Diagnosis not present

## 2014-09-09 DIAGNOSIS — M436 Torticollis: Secondary | ICD-10-CM | POA: Diagnosis not present

## 2014-09-09 DIAGNOSIS — R293 Abnormal posture: Secondary | ICD-10-CM

## 2014-09-09 DIAGNOSIS — R1310 Dysphagia, unspecified: Secondary | ICD-10-CM | POA: Diagnosis not present

## 2014-09-09 NOTE — Therapy (Signed)
[place], [place], [address] Phone: [phone]   Fax:  [phone]  Physical Therapy Treatment  Patient Details  Name: Dylan Moreno MRN: [id number] Date of Birth: 1940/10/12 Referring Provider:  Heath Lark, MD  Encounter Date: 09/09/2014      PT End of Session - 09/09/14 1151    PT Start Time 1102   PT Stop Time 1149   PT Time Calculation (min) 47 min      Past Medical History  Diagnosis Date  . Hyperlipemia   . Arthritis   . ADHD (attention deficit hyperactivity disorder)   . Wears glasses   . Hypercholesterolemia   . Peyronie's disease   . Kidney stones   . TIA (transient ischemic attack)   . Hypertension   . Anxiety   . GERD (gastroesophageal reflux disease)   . 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 TIA (transient ischemic attack) 12/29/12    left facial droop  . Dysuria 07/15/2014    Past Surgical History  Procedure Laterality Date  . Dupuytren contracture release  2012    left hand  . Tonsillectomy    . Shoulder arthroscopy w/ rotator cuff repair      left  . Colonoscopy    . Cystoscopy w/ ureteroscopy w/ lithotripsy  2011  . Hand surgery      right  . Dupuytren contracture release Right 10/30/2012    Procedure: DUPUYTREN CONTRACTURE RELEASE RIGHT PALM,RING AND SMALL FINGER;  Surgeon: Cammie Sickle., MD;  Location: [facility];  Service: Orthopedics;  Laterality: Right;    There were no vitals filed for this visit.  Visit Diagnosis:  Lymphedema  Poor posture      Subjective Assessment - 09/09/14 1110    Subjective I just want this to go away.  He reported frustration with his swelling and wants it to go away.   Pertinent History Diagnosed 3/16 with cancer on base of tongue. He underwent chemotherapy and radiation and completed that on 05/25/14.  He reports swelling in his neck began after radiation around 06/02/14.   Patient  Stated Goals reduce swelling in his neck   Currently in Pain? No/denies                         [facility] Adult PT Treatment/Exercise - 09/09/14 0001    Manual Therapy   Manual Therapy Manual Lymphatic Drainage (MLD)   Manual Lymphatic Drainage (MLD) diaphragmatic breathing, supraclavicular fossae, bilateral shoulder collectors, bilateral axillae and anterior chest; lateral, anterolateral, and anterior neck; posterior neck, chin and cheeks.                   Short Term Clinic Goals - 09/02/14 1241    CC Short Term Goal  #1   Title Patient will be able to demonstrate proper technique with initial HEP to increase cervical ROM for promoting lymphatic circulation   Time 4   Period Weeks   Status On-going   CC Short Term Goal  #2   Title Patient will demonstrate a reduction in neck swelling by >/= 0.5 cm at 8 cm proximal to sternal notch around neck   Time 4   Period Weeks   Status On-going   CC Short Term Goal  #3   Title Patient's wife or caregiver will demonstrate proper technique for doing manual lymph drainage with some verbal and tactile  cues.   Time 4   Period Weeks   Status On-going             Long Term Clinic Goals - 08/12/14 1311    CC Long Term Goal  #1   Title Patient will be able to demonstrate proper technique with final HEP and verbalize safe self progression to increase cervical ROM for promoting lymphatic circulation   Time 8   Period Weeks   Status New   CC Long Term Goal  #2   Title Patient will demonstrate a reduction in neck swelling by >/= 1 cm at 8 cm proximal to sternal notch around neck   CC Long Term Goal  #3   Title Patient's wife or caregiver will demonstrate proper technique for doing manual lymph drainage without verbal and tactile cues.   CC Long Term Goal  #4   Title Patient and his wife/caregiver will verbalize where and how to be fitted for a head and neck compression garment if they decide to proceed with that.    Time 8   Period Days   Status New         Head and Neck Clinic Goals - 03/17/14 2056    Patient will be able to verbalize understanding of a home exercise program for cervical range of motion, posture, and walking.    Status Achieved   Patient will be able to verbalize understanding of proper sitting and standing posture.    Status Achieved   Patient will be able to verbalize understanding of lymphedema risk and availability of treatment for this condition.    Status Achieved           Plan - 09/09/14 1117    Clinical Impression Statement Patient continues to have swelling collected in his anterior neck.  Posture is not helping this.  He is frustrated with his swelling and his cancer history but his memory makes it difficult to progress with education.  Phoned rep for [employer] and he will be measured on monday.   Pt will benefit from skilled therapeutic intervention in order to improve on the following deficits Increased edema;Decreased range of motion;Postural dysfunction   Rehab Potential Good   Clinical Impairments Affecting Rehab Potential Poor memory and attention   PT Frequency 2x / week   PT Duration 8 weeks   PT Next Visit Plan Continue manual lymph drainage and instruction. We will not work on strength, balance, posture due to patient receiving PT elsewhere for that.   PT Home Exercise Plan manual lymph drainage daily   Consulted and Agree with Plan of Care Patient        Problem List Patient Active Problem List   Diagnosis Date Noted  . 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 Dysuria 07/15/2014  . Dysphagia 06/17/2014  . Drug-induced memory loss 06/17/2014  . Cerebral ventriculomegaly 06/08/2014  . PEG (percutaneous endoscopic gastrostomy) status 06/08/2014  . Alcohol abuse 06/08/2014  . Dilated pupil 06/04/2014  . Aspiration into airway   . Acute delirium 06/02/2014  . Tongue swelling   . Hyponatremia   . Protein-calorie malnutrition, severe  05/26/2014  . Subacute delirium 05/25/2014  . 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 Confusion caused by a drug 05/20/2014  . Protein calorie malnutrition 05/20/2014  . Insomnia 05/10/2014  . Leukopenia due to antineoplastic chemotherapy 04/28/2014  . Mucositis due to chemotherapy 04/22/2014  . Dehydration 04/22/2014  . Weight loss 04/14/2014  . Chemotherapy-induced nausea 04/14/2014  . Mild to moderate  hearing loss 03/18/2014  . 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  . TIA (transient ischemic attack) 12/29/2012  . Hyperlipemia   . ADHD (attention deficit hyperactivity disorder)    Annia Friendly, PT 09/09/2014 11:53 AM  [place], [place], [address] Phone: [phone]   Fax:  [phone]

## 2014-09-10 DIAGNOSIS — R262 Difficulty in walking, not elsewhere classified: Secondary | ICD-10-CM | POA: Diagnosis not present

## 2014-09-10 DIAGNOSIS — M6281 Muscle weakness (generalized): Secondary | ICD-10-CM | POA: Diagnosis not present

## 2014-09-13 ENCOUNTER — Ambulatory Visit: Payer: Medicare Other | Admitting: Physical Therapy

## 2014-09-13 DIAGNOSIS — M436 Torticollis: Secondary | ICD-10-CM

## 2014-09-13 DIAGNOSIS — R1310 Dysphagia, unspecified: Secondary | ICD-10-CM | POA: Diagnosis not present

## 2014-09-13 DIAGNOSIS — Z9189 Other specified personal risk factors, not elsewhere classified: Secondary | ICD-10-CM | POA: Diagnosis not present

## 2014-09-13 DIAGNOSIS — R293 Abnormal posture: Secondary | ICD-10-CM | POA: Diagnosis not present

## 2014-09-13 DIAGNOSIS — M6281 Muscle weakness (generalized): Secondary | ICD-10-CM | POA: Diagnosis not present

## 2014-09-13 DIAGNOSIS — R262 Difficulty in walking, not elsewhere classified: Secondary | ICD-10-CM | POA: Diagnosis not present

## 2014-09-13 DIAGNOSIS — I890 Lymphedema, not elsewhere classified: Secondary | ICD-10-CM | POA: Diagnosis not present

## 2014-09-13 NOTE — Therapy (Signed)
[place], [place], [address] Phone: [phone]   Fax:  [phone]  Physical Therapy Treatment  Patient Details  Name: Dylan Moreno MRN: [id number] Date of Birth: April 30, 1940 Referring Provider:  Heath Lark, MD  Encounter Date: 09/13/2014      PT End of Session - 09/13/14 2224    Visit Number 10   Number of Visits 16   Date for PT Re-Evaluation 10/07/14   PT Start Time 1525   PT Stop Time 1605   PT Time Calculation (min) 40 min   Activity Tolerance Patient tolerated treatment well   Behavior During Therapy [facility] for tasks assessed/performed      Past Medical History  Diagnosis Date  . Hyperlipemia   . Arthritis   . ADHD (attention deficit hyperactivity disorder)   . Wears glasses   . Hypercholesterolemia   . Peyronie's disease   . Kidney stones   . TIA (transient ischemic attack)   . Hypertension   . Anxiety   . GERD (gastroesophageal reflux disease)   . 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 TIA (transient ischemic attack) 12/29/12    left facial droop  . Dysuria 07/15/2014    Past Surgical History  Procedure Laterality Date  . Dupuytren contracture release  2012    left hand  . Tonsillectomy    . Shoulder arthroscopy w/ rotator cuff repair      left  . Colonoscopy    . Cystoscopy w/ ureteroscopy w/ lithotripsy  2011  . Hand surgery      right  . Dupuytren contracture release Right 10/30/2012    Procedure: DUPUYTREN CONTRACTURE RELEASE RIGHT PALM,RING AND SMALL FINGER;  Surgeon: Cammie Sickle., MD;  Location: [facility];  Service: Orthopedics;  Laterality: Right;    There were no vitals filed for this visit.  Visit Diagnosis:  Lymphedema  Stiffness of neck      Subjective Assessment - 09/13/14 1526    Subjective (p) Nothing new.  Reports he hasn't been wearing his chip pack regularly, and asks about what that does.  Says he  is to be measured for a compression garment today.   Currently in Pain? (p) No/denies                         [facility] Adult PT Treatment/Exercise - 09/13/14 0001    Manual Therapy   Manual Lymphatic Drainage (MLD) diaphragmatic breathing, supraclavicular fossae, bilateral shoulder collectors, bilateral axillae and anterior chest; lateral, anterolateral, and anterior neck; posterior neck, chin and cheeks.; continued to instruct patient as manual lymph drainage was performed and answered his many questions about this                   Short Term Clinic Goals - 09/13/14 2228    CC Short Term Goal  #1   Title Patient will be able to demonstrate proper technique with initial HEP to increase cervical ROM for promoting lymphatic circulation   Status On-going   CC Short Term Goal  #2   Title Patient will demonstrate a reduction in neck swelling by >/= 0.5 cm at 8 cm proximal to sternal notch around neck   Status On-going   CC Short Term Goal  #3   Title Patient's wife or caregiver will demonstrate proper technique for doing manual lymph drainage with some verbal and tactile cues.  Status Achieved             Long Term Clinic Goals - 09-22-2014 Feb 20, 2226    CC Long Term Goal  #1   Title Patient will be able to demonstrate proper technique with final HEP and verbalize safe self progression to increase cervical ROM for promoting lymphatic circulation   Status On-going   CC Long Term Goal  #2   Title Patient will demonstrate a reduction in neck swelling by >/= 1 cm at 8 cm proximal to sternal notch around neck   Status On-going   CC Long Term Goal  #3   Title Patient's wife or caregiver will demonstrate proper technique for doing manual lymph drainage without verbal and tactile cues.   Status On-going   CC Long Term Goal  #4   Title Patient and his wife/caregiver will verbalize where and how to be fitted for a head and neck compression garment if they decide to proceed  with that.   Baseline measured today for a [employer]   Status Achieved         Head and Neck Clinic Goals - 03/17/14 2054-02-20    Patient will be able to verbalize understanding of a home exercise program for cervical range of motion, posture, and walking.    Status Achieved   Patient will be able to verbalize understanding of proper sitting and standing posture.    Status Achieved   Patient will be able to verbalize understanding of lymphedema risk and availability of treatment for this condition.    Status Achieved           Plan - 09-22-2014 2223-02-21    Clinical Impression Statement Patient's anterior neck appeared more swollen today and was firm to the touch at the beginning of the session; at the end of the session, it was visibly and palpably softer.  He reports not having been wearing his foam chip pack often at home.  He continues to have many questions about his treatment and continued self-care for lymphedema.  He was measured for a manufactured compression garment at the end of his session today.   Pt will benefit from skilled therapeutic intervention in order to improve on the following deficits Increased edema;Decreased range of motion;Postural dysfunction   Rehab Potential Good   Clinical Impairments Affecting Rehab Potential Poor memory and attention   PT Frequency 2x / week   PT Duration 8 weeks   PT Treatment/Interventions Manual lymph drainage;Manual techniques   PT Next Visit Plan Remeasure neck.  Continue manual lymph drainage and instruction.   PT Home Exercise Plan manual lymph drainage daily   Consulted and Agree with Plan of Care Patient          G-Codes - September 22, 2014 Feb 21, 2228    Functional Assessment Tool Used Clinical Judgement   Functional Limitation Self care   Self Care Current Status [phone]) At least 20 percent but less than 40 percent impaired, limited or restricted   Self Care Goal Status ([id number]) At least 1 percent but less than 20 percent  impaired, limited or restricted      Problem List Patient Active Problem List   Diagnosis Date Noted  . 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 Dysuria 07/15/2014  . Dysphagia 06/17/2014  . Drug-induced memory loss 06/17/2014  . Cerebral ventriculomegaly 06/08/2014  . PEG (percutaneous endoscopic gastrostomy) status 06/08/2014  . Alcohol abuse 06/08/2014  . Dilated pupil 06/04/2014  . Aspiration into airway   . Acute delirium 06/02/2014  .  Tongue swelling   . Hyponatremia   . Protein-calorie malnutrition, severe 05/26/2014  . Subacute delirium 05/25/2014  . 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 Confusion caused by a drug 05/20/2014  . Protein calorie malnutrition 05/20/2014  . Insomnia 05/10/2014  . Leukopenia due to antineoplastic chemotherapy 04/28/2014  . Mucositis due to chemotherapy 04/22/2014  . Dehydration 04/22/2014  . Weight loss 04/14/2014  . Chemotherapy-induced nausea 04/14/2014  . 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  . TIA (transient ischemic attack) 12/29/2012  . Hyperlipemia   . ADHD (attention deficit hyperactivity disorder)     S[place]IS[place]Y,DONNA 09/13/2014, 10:31 PM  [place], [place], [address] Phone: [phone]   Fax:  [phone]  Physical Therapy Progress Note  Dates of Reporting Period: 08/12/14 to 09/13/14  Objective Reports of Subjective Statement: Swelling of neck not well-controlled yet; patient reports having manual lymph drainage done at home but not using his compression much.  Measurements have not improved to the point of meeting goals yet.  Objective Measurements:  Circumference measurement goals not yet met as noted previously in the record.  Goal Update: See goal section above.  Plan: Patient was measured for a manufactured compression garment today.  Continue manual lymph drainage, education, and  helping patient with self-care.  Reason Skilled Services are Required: Lymphedema is not yet well-managed; patient should be able to show good improvement with skilled services.  Serafina Royals, PT 09/13/2014 10:37 PM

## 2014-09-14 ENCOUNTER — Encounter: Payer: Self-pay | Admitting: Hematology and Oncology

## 2014-09-14 NOTE — Progress Notes (Signed)
I placed aflac form on desk of nurse for dr. Alvy Bimler

## 2014-09-14 NOTE — Progress Notes (Signed)
I faxed aflac forms to debbie  [phone]

## 2014-09-15 ENCOUNTER — Telehealth: Payer: Self-pay | Admitting: Adult Health

## 2014-09-15 ENCOUNTER — Ambulatory Visit (HOSPITAL_COMMUNITY): Payer: Self-pay | Admitting: Clinical

## 2014-09-15 DIAGNOSIS — M6281 Muscle weakness (generalized): Secondary | ICD-10-CM | POA: Diagnosis not present

## 2014-09-15 DIAGNOSIS — R262 Difficulty in walking, not elsewhere classified: Secondary | ICD-10-CM | POA: Diagnosis not present

## 2014-09-15 NOTE — Telephone Encounter (Signed)
I received a call today from Mr. Spillers's wife, after meeting both she and the patient last evening at the [facility] support group.   Mrs. Zavalza tells me that she has continued concerns regarding her husband's cognitive changes, which have continued to decline since completing cancer treatment.  She tells me that he has a h/o TIA and was showing some early cognitive changes about 2 years ago (long before he was even diagnosed with cancer).  She tells me that he has problems with seemingly tasks (i.e. Cannot remember where the oatmeal is kept in the cupboard and cannot even remember how to fix oatmeal).  She states that his memory is continuing to decline.  They have seen Dr. Posey Pronto (neurologist) for several years and they have recommended formal neuropsychiatric testing in [place].  Mrs. Virgil has not received any further calls from her neurologist's office about scheduling this appt and called me to inquire about other options.  She also would like a prescription for his nutritional supplement, so that they can receive it from [place].   Plan:  -I encouraged Mrs. Rauh to contact Antionette Poles, PhD (neuropsychologist) here in [place] to see if he accepts their insurance.  I then encouraged her to call her PCP or Dr. Posey Pronto and have them refer the patient for neurocognitive testing with Dr. Valentina Shaggy. I gave Mrs. Allbritton the direct number for Dr. Monico Hoar office.  I also let Dr. Alvy Bimler know about Mrs. Herda's request for a prescription for nutritional supplementation.  Mrs. Blakeley agrees with this plan and will call me if she needs any additional help.   They will see Dr. Alvy Bimler for routine f/u on 09/22/14, as scheduled. I encouraged Mrs. Bordley to call me with any other questions or concerns.   Mike Craze, NP [place] [phone]

## 2014-09-16 ENCOUNTER — Other Ambulatory Visit: Payer: Self-pay | Admitting: *Deleted

## 2014-09-16 ENCOUNTER — Encounter: Payer: Self-pay | Admitting: Physical Therapy

## 2014-09-16 ENCOUNTER — Ambulatory Visit: Payer: Medicare Other | Admitting: Physical Therapy

## 2014-09-16 ENCOUNTER — Telehealth: Payer: Self-pay | Admitting: Neurology

## 2014-09-16 DIAGNOSIS — M436 Torticollis: Secondary | ICD-10-CM | POA: Diagnosis not present

## 2014-09-16 DIAGNOSIS — Z9189 Other specified personal risk factors, not elsewhere classified: Secondary | ICD-10-CM | POA: Diagnosis not present

## 2014-09-16 DIAGNOSIS — R4189 Other symptoms and signs involving cognitive functions and awareness: Secondary | ICD-10-CM

## 2014-09-16 DIAGNOSIS — R1310 Dysphagia, unspecified: Secondary | ICD-10-CM | POA: Diagnosis not present

## 2014-09-16 DIAGNOSIS — R413 Other amnesia: Secondary | ICD-10-CM

## 2014-09-16 DIAGNOSIS — R4689 Other symptoms and signs involving appearance and behavior: Secondary | ICD-10-CM

## 2014-09-16 DIAGNOSIS — I890 Lymphedema, not elsewhere classified: Secondary | ICD-10-CM

## 2014-09-16 DIAGNOSIS — R293 Abnormal posture: Secondary | ICD-10-CM | POA: Diagnosis not present

## 2014-09-16 NOTE — Telephone Encounter (Signed)
Referral sent 

## 2014-09-16 NOTE — Telephone Encounter (Signed)
Called patient's wife and she said that they have not heard from [place].  She would like a referral to Dr. Valentina Shaggy.  Ok to send?

## 2014-09-16 NOTE — Telephone Encounter (Signed)
Of course, please send the referral.

## 2014-09-16 NOTE — Therapy (Signed)
[place], [place], [address] Phone: [phone]   Fax:  [phone]  Physical Therapy Treatment  Patient Details  Name: Dylan Moreno MRN: [id number] Date of Birth: [birth year] Referring Provider:  Heath Lark, MD  Encounter Date: 09/16/2014      PT End of Session - 09/16/14 1200    Visit Number 11   Number of Visits 16   Date for PT Re-Evaluation 10/07/14   PT Start Time 1101   PT Stop Time 1145   PT Time Calculation (min) 44 min   Activity Tolerance Patient tolerated treatment well   Behavior During Therapy [facility] for tasks assessed/performed      Past Medical History  Diagnosis Date  . Hyperlipemia   . Arthritis   . ADHD (attention deficit hyperactivity disorder)   . Wears glasses   . Hypercholesterolemia   . Peyronie's disease   . Kidney stones   . TIA (transient ischemic attack)   . Hypertension   . Anxiety   . GERD (gastroesophageal reflux disease)   . 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 TIA (transient ischemic attack) 12/29/12    left facial droop  . Dysuria 07/15/2014    Past Surgical History  Procedure Laterality Date  . Dupuytren contracture release  2012    left hand  . Tonsillectomy    . Shoulder arthroscopy w/ rotator cuff repair      left  . Colonoscopy    . Cystoscopy w/ ureteroscopy w/ lithotripsy  2011  . Hand surgery      right  . Dupuytren contracture release Right 10/30/2012    Procedure: DUPUYTREN CONTRACTURE RELEASE RIGHT PALM,RING AND SMALL FINGER;  Surgeon: Cammie Sickle., MD;  Location: [place];  Service: Orthopedics;  Laterality: Right;    There were no vitals filed for this visit.  Visit Diagnosis:  Lymphedema      Subjective Assessment - 09/16/14 1106    Subjective When I wear the chip pack, my swelling goes away within an hour or so.  It's amazing!  I don't know how quickly it comes back but that  thing works!   Pertinent History Diagnosed 3/16 with cancer on base of tongue. He underwent chemotherapy and radiation and completed that on 05/25/14.  He reports swelling in his neck began after radiation around 06/02/14.   Patient Stated Goals reduce swelling in his neck   Currently in Pain? No/denies               LYMPHEDEMA/ONCOLOGY QUESTIONNAIRE - 09/16/14 1107    Head and Neck   4 cm superior to sternal notch around neck 44 cm   6 cm superior to sternal notch around neck 44.7 cm   8 cm superior to sternal notch around neck 46 cm   Other 57.7 cm. around head above lip and below nose/ears  around head above lip and below ears                  [facility] Adult PT Treatment/Exercise - 09/16/14 0001    Manual Therapy   Manual Therapy Manual Lymphatic Drainage (MLD)   Manual Lymphatic Drainage (MLD) diaphragmatic breathing, supraclavicular fossae, bilateral shoulder collectors, bilateral axillae and anterior chest; lateral, anterolateral, and anterior neck; posterior neck, chin and cheeks.; continued to instruct patient as manual lymph drainage was performed and answered his many questions about this  Short Term Clinic Goals - 09/16/14 1204    CC Short Term Goal  #1   Title Patient will be able to demonstrate proper technique with initial HEP to increase cervical ROM for promoting lymphatic circulation   Time 4   Period Weeks   Status Achieved   CC Short Term Goal  #2   Title Patient will demonstrate a reduction in neck swelling by >/= 0.5 cm at 8 cm proximal to sternal notch around neck   Time 4   Period Weeks   Status On-going   CC Short Term Goal  #3   Title Patient's wife or caregiver will demonstrate proper technique for doing manual lymph drainage with some verbal and tactile cues.   Time 4   Period Weeks   Status Achieved             Long Term Clinic Goals - 09/16/14 1204    CC Long Term Goal  #1   Title Patient will be able  to demonstrate proper technique with final HEP and verbalize safe self progression to increase cervical ROM for promoting lymphatic circulation   Time 8   Period Weeks   Status On-going   CC Long Term Goal  #2   Title Patient will demonstrate a reduction in neck swelling by >/= 1 cm at 8 cm proximal to sternal notch around neck   Time 8   Period Weeks   Status On-going   CC Long Term Goal  #3   Title Patient's wife or caregiver will demonstrate proper technique for doing manual lymph drainage without verbal and tactile cues.   Time 8   Period Weeks   Status On-going   CC Long Term Goal  #4   Title Patient and his wife/caregiver will verbalize where and how to be fitted for a head and neck compression garment if they decide to proceed with that.   Time 8   Period Weeks   Status Achieved         Head and Neck Clinic Goals - 03/17/14 2056    Patient will be able to verbalize understanding of a home exercise program for cervical range of motion, posture, and walking.    Status Achieved   Patient will be able to verbalize understanding of proper sitting and standing posture.    Status Achieved   Patient will be able to verbalize understanding of lymphedema risk and availability of treatment for this condition.    Status Achieved           Plan - 09/16/14 1200    Clinical Impression Statement Mr. Heyne continues to have good success with manual lymph drainage and significant reduction while wearing his chip pack.  However, there is poor follow through at home and his measurements are not consistently reducing.  We discussed today planning for discahrge in 1-2 weeks when his head/neck compression garment arrives and he agrees to that.  A new chip pack was made and issued today so he can keep one at home and one at work.  He felt that would improve compliance.  His poor memory is interfering with follow through.  Measurements taken today showed no improvement.   Pt  will benefit from skilled therapeutic intervention in order to improve on the following deficits Increased edema;Decreased range of motion;Postural dysfunction   Rehab Potential Good   Clinical Impairments Affecting Rehab Potential Poor memory and attention   PT Frequency 2x / week   PT Duration 8 weeks  PT Treatment/Interventions Manual lymph drainage;Manual techniques   PT Next Visit Plan Manual lymph drainage;    PT Home Exercise Plan manual lymph drainage daily        Problem List Patient Active Problem List   Diagnosis Date Noted  . 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 Dysuria 07/15/2014  . Dysphagia 06/17/2014  . Drug-induced memory loss 06/17/2014  . Cerebral ventriculomegaly 06/08/2014  . PEG (percutaneous endoscopic gastrostomy) status 06/08/2014  . Alcohol abuse 06/08/2014  . Dilated pupil 06/04/2014  . Aspiration into airway   . Acute delirium 06/02/2014  . Tongue swelling   . Hyponatremia   . Protein-calorie malnutrition, severe 05/26/2014  . Subacute delirium 05/25/2014  . 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 Confusion caused by a drug 05/20/2014  . Protein calorie malnutrition 05/20/2014  . Insomnia 05/10/2014  . Leukopenia due to antineoplastic chemotherapy 04/28/2014  . Mucositis due to chemotherapy 04/22/2014  . Dehydration 04/22/2014  . Weight loss 04/14/2014  . Chemotherapy-induced nausea 04/14/2014  . 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  . TIA (transient ischemic attack) 12/29/2012  . Hyperlipemia   . ADHD (attention deficit hyperactivity disorder)    Annia Friendly, PT 09/16/2014 12:05 PM  [place], [place], [address] Phone: [phone]   Fax:  [phone]   Physical Therapy Progress Note  Dates of Reporting Period: 08/12/14 to 09/16/14  Objective Reports of Subjective Statement: Please see objective  measurements of head/neck taken today.  See above.  He continues to have significant swelling present in his anterior neck which reduces nicely with manual lymph drainage and wearing his compression chip pack but compliance at home is an issue partly due to poor memory and motivation.  Objective Measurements: Please see objective measurements of head/neck taken today.  See above.  Goal Update: See goals updated above  Plan: See plan above.  Reason Skilled Services are Required: Persistent head/neck lymphedema  Annia Friendly, PT 09/16/2014 12:08 PM

## 2014-09-16 NOTE — Telephone Encounter (Signed)
Pt/Wife Dylan Moreno, called concerning an arrangement to see Dr. Valentina Shaggy for cognitive testing/ call back @ [phone]

## 2014-09-17 DIAGNOSIS — M6281 Muscle weakness (generalized): Secondary | ICD-10-CM | POA: Diagnosis not present

## 2014-09-17 DIAGNOSIS — R262 Difficulty in walking, not elsewhere classified: Secondary | ICD-10-CM | POA: Diagnosis not present

## 2014-09-20 ENCOUNTER — Ambulatory Visit: Payer: Medicare Other

## 2014-09-20 DIAGNOSIS — R1310 Dysphagia, unspecified: Secondary | ICD-10-CM | POA: Diagnosis not present

## 2014-09-20 DIAGNOSIS — I890 Lymphedema, not elsewhere classified: Secondary | ICD-10-CM | POA: Diagnosis not present

## 2014-09-20 DIAGNOSIS — M436 Torticollis: Secondary | ICD-10-CM | POA: Diagnosis not present

## 2014-09-20 DIAGNOSIS — Z9189 Other specified personal risk factors, not elsewhere classified: Secondary | ICD-10-CM | POA: Diagnosis not present

## 2014-09-20 DIAGNOSIS — R293 Abnormal posture: Secondary | ICD-10-CM | POA: Diagnosis not present

## 2014-09-20 NOTE — Therapy (Signed)
[place], [place], [address] Phone: [phone]   Fax:  [phone]  Physical Therapy Treatment  Patient Details  Name: Dylan Moreno MRN: [id number] Date of Birth: 01-15-1940 Referring Provider:  Heath Lark, MD  Encounter Date: 09/20/2014      PT End of Session - 09/20/14 1152    Visit Number 12   Number of Visits 16   Date for PT Re-Evaluation 10/07/14   PT Start Time 1105   PT Stop Time 1149   PT Time Calculation (min) 44 min   Activity Tolerance Patient tolerated treatment well   Behavior During Therapy [facility] for tasks assessed/performed      Past Medical History  Diagnosis Date  . Hyperlipemia   . Arthritis   . ADHD (attention deficit hyperactivity disorder)   . Wears glasses   . Hypercholesterolemia   . Peyronie's disease   . Kidney stones   . TIA (transient ischemic attack)   . Hypertension   . Anxiety   . GERD (gastroesophageal reflux disease)   . 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 TIA (transient ischemic attack) 12/29/12    left facial droop  . Dysuria 07/15/2014    Past Surgical History  Procedure Laterality Date  . Dupuytren contracture release  2012    left hand  . Tonsillectomy    . Shoulder arthroscopy w/ rotator cuff repair      left  . Colonoscopy    . Cystoscopy w/ ureteroscopy w/ lithotripsy  2011  . Hand surgery      right  . Dupuytren contracture release Right 10/30/2012    Procedure: DUPUYTREN CONTRACTURE RELEASE RIGHT PALM,RING AND SMALL FINGER;  Surgeon: Cammie Sickle., MD;  Location: [facility];  Service: Orthopedics;  Laterality: Right;    There were no vitals filed for this visit.  Visit Diagnosis:  Lymphedema      Subjective Assessment - 09/20/14 1109    Subjective I fell on Saturday, I grabbed ontoa  pole in the parking lot and it was slippery and I went straight down onto the gravel. Just scraped  my legs up, I don't really hurt anywhere though. My neck is swollen pretty much every morning but as soon as I sit in my chair and wear that chip pack for about an hour, it goes down a whole bunch.    Currently in Pain? No/denies                         [facility] Adult PT Treatment/Exercise - 09/20/14 0001    Manual Therapy   Manual Therapy Manual Lymphatic Drainage (MLD)   Manual Lymphatic Drainage (MLD) diaphragmatic breathing, supraclavicular fossae, bilateral shoulder collectors, bilateral axillae and anterior chest; lateral, anterolateral, and anterior neck; posterior neck, chin and cheeks.; continued to instruct patient as manual lymph drainage was performed and answered his many questions about this                   Short Term Clinic Goals - 09/16/14 1204    CC Short Term Goal  #1   Title Patient will be able to demonstrate proper technique with initial HEP to increase cervical ROM for promoting lymphatic circulation   Time 4   Period Weeks   Status Achieved   CC Short Term Goal  #2   Title Patient will demonstrate a reduction in neck swelling  by >/= 0.5 cm at 8 cm proximal to sternal notch around neck   Time 4   Period Weeks   Status On-going   CC Short Term Goal  #3   Title Patient's wife or caregiver will demonstrate proper technique for doing manual lymph drainage with some verbal and tactile cues.   Time 4   Period Weeks   Status Achieved             Long Term Clinic Goals - 09/16/14 1204    CC Long Term Goal  #1   Title Patient will be able to demonstrate proper technique with final HEP and verbalize safe self progression to increase cervical ROM for promoting lymphatic circulation   Time 8   Period Weeks   Status On-going   CC Long Term Goal  #2   Title Patient will demonstrate a reduction in neck swelling by >/= 1 cm at 8 cm proximal to sternal notch around neck   Time 8   Period Weeks   Status On-going   CC Long Term Goal  #3    Title Patient's wife or caregiver will demonstrate proper technique for doing manual lymph drainage without verbal and tactile cues.   Time 8   Period Weeks   Status On-going   CC Long Term Goal  #4   Title Patient and his wife/caregiver will verbalize where and how to be fitted for a head and neck compression garment if they decide to proceed with that.   Time 8   Period Weeks   Status Achieved         Head and Neck Clinic Goals - 03/17/14 2056    Patient will be able to verbalize understanding of a home exercise program for cervical range of motion, posture, and walking.    Status Achieved   Patient will be able to verbalize understanding of proper sitting and standing posture.    Status Achieved   Patient will be able to verbalize understanding of lymphedema risk and availability of treatment for this condition.    Status Achieved           Plan - 09/20/14 1152    Clinical Impression Statement Reviewed importance of self manual lymph drainage today with [name] and he reports hasn't done it yet at home due to feeling like the chip pack reduces his fluid very well as does just being up as opposed to laying down. We are just awaiting the arrival of his compression garment.   Pt will benefit from skilled therapeutic intervention in order to improve on the following deficits Increased edema;Decreased range of motion;Postural dysfunction   Rehab Potential Good   Clinical Impairments Affecting Rehab Potential Poor memory and attention   PT Frequency 2x / week   PT Duration 8 weeks   PT Treatment/Interventions Manual lymph drainage;Manual techniques   PT Next Visit Plan Manual lymph drainage; remeasure circumference and cont instructing in importance of self manual lymph drainage so he'll start having more consistency with his reductions.   PT Home Exercise Plan manual lymph drainage daily   Consulted and Agree with Plan of Care Patient        Problem  List Patient Active Problem List   Diagnosis Date Noted  . 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 Dysuria 07/15/2014  . Dysphagia 06/17/2014  . Drug-induced memory loss 06/17/2014  . Cerebral ventriculomegaly 06/08/2014  . PEG (percutaneous endoscopic gastrostomy) status 06/08/2014  . Alcohol abuse 06/08/2014  . Dilated pupil  06/04/2014  . Aspiration into airway   . Acute delirium 06/02/2014  . Tongue swelling   . Hyponatremia   . Protein-calorie malnutrition, severe 05/26/2014  . Subacute delirium 05/25/2014  . 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 Confusion caused by a drug 05/20/2014  . Protein calorie malnutrition 05/20/2014  . Insomnia 05/10/2014  . Leukopenia due to antineoplastic chemotherapy 04/28/2014  . Mucositis due to chemotherapy 04/22/2014  . Dehydration 04/22/2014  . Weight loss 04/14/2014  . Chemotherapy-induced nausea 04/14/2014  . 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  . TIA (transient ischemic attack) 12/29/2012  . Hyperlipemia   . ADHD (attention deficit hyperactivity disorder)     Otelia Limes, PTA 09/20/2014, 11:57 AM  [place], [place], [address] Phone: [phone]   Fax:  [phone]

## 2014-09-21 ENCOUNTER — Ambulatory Visit (HOSPITAL_COMMUNITY)
Admission: RE | Admit: 2014-09-21 | Discharge: 2014-09-21 | Disposition: A | Discharge status: Home / Self Care | Payer: Medicare Other | Source: Ambulatory Visit | Attending: Hematology and Oncology | Admitting: Hematology and Oncology

## 2014-09-21 ENCOUNTER — Ambulatory Visit (HOSPITAL_BASED_OUTPATIENT_CLINIC_OR_DEPARTMENT_OTHER): Payer: Medicare Other

## 2014-09-21 ENCOUNTER — Other Ambulatory Visit (HOSPITAL_BASED_OUTPATIENT_CLINIC_OR_DEPARTMENT_OTHER): Payer: Medicare Other

## 2014-09-21 DIAGNOSIS — C01 Malignant neoplasm of base of tongue: Secondary | ICD-10-CM | POA: Insufficient documentation

## 2014-09-21 DIAGNOSIS — M6281 Muscle weakness (generalized): Secondary | ICD-10-CM | POA: Diagnosis not present

## 2014-09-21 DIAGNOSIS — Z923 Personal history of irradiation: Secondary | ICD-10-CM | POA: Insufficient documentation

## 2014-09-21 DIAGNOSIS — Z95828 Presence of other vascular implants and grafts: Secondary | ICD-10-CM

## 2014-09-21 DIAGNOSIS — I6523 Occlusion and stenosis of bilateral carotid arteries: Secondary | ICD-10-CM | POA: Diagnosis not present

## 2014-09-21 DIAGNOSIS — R262 Difficulty in walking, not elsewhere classified: Secondary | ICD-10-CM | POA: Diagnosis not present

## 2014-09-21 DIAGNOSIS — Z9221 Personal history of antineoplastic chemotherapy: Secondary | ICD-10-CM | POA: Diagnosis not present

## 2014-09-21 DIAGNOSIS — J432 Centrilobular emphysema: Secondary | ICD-10-CM | POA: Diagnosis not present

## 2014-09-21 LAB — COMPREHENSIVE METABOLIC PANEL (CC13)
ALT: 24 U/L (ref 0–55)
ANION GAP: 6 meq/L (ref 3–11)
AST: 22 U/L (ref 5–34)
Albumin: 3.8 g/dL (ref 3.5–5.0)
Alkaline Phosphatase: 100 U/L (ref 40–150)
BUN: 27.7 mg/dL — ABNORMAL HIGH (ref 7.0–26.0)
CALCIUM: 9.8 mg/dL (ref 8.4–10.4)
CHLORIDE: 102 meq/L (ref 98–109)
CO2: 28 mEq/L (ref 22–29)
Creatinine: 0.9 mg/dL (ref 0.7–1.3)
EGFR: 86 mL/min/{1.73_m2} — AB (ref >90)
Glucose: 84 mg/dl (ref 70–140)
POTASSIUM: 4.4 meq/L (ref 3.5–5.1)
Sodium: 135 mEq/L — ABNORMAL LOW (ref 136–145)
Total Bilirubin: 0.54 mg/dL (ref 0.20–1.20)
Total Protein: 6.9 g/dL (ref 6.4–8.3)

## 2014-09-21 LAB — MAGNESIUM (CC13): MAGNESIUM: 2.2 mg/dL (ref 1.5–2.5)

## 2014-09-21 MED ORDER — IOHEXOL 300 MG/ML  SOLN
75.0000 mL | Freq: Once | INTRAMUSCULAR | Status: AC | PRN
  Administered 2014-09-21: 75 mL via INTRAVENOUS

## 2014-09-21 MED ORDER — SODIUM CHLORIDE 0.9 % IJ SOLN
10.0000 mL | INTRAMUSCULAR | Status: DC | PRN
  Administered 2014-09-21: 10 mL via INTRAVENOUS
  Filled 2014-09-21: qty 10

## 2014-09-21 NOTE — Patient Instructions (Signed)
Pt to have port needle de-accessed after CT scan today 09/21/14 before leaving [facility].  Implanted [facility] Guide An implanted port is a type of central line that is placed under the skin. Central lines are used to provide IV access when treatment or nutrition needs to be given through a person's veins. Implanted ports are used for long-term IV access. An implanted port may be placed because:   You need IV medicine that would be irritating to the small veins in your hands or arms.   You need long-term IV medicines, such as antibiotics.   You need IV nutrition for a long period.   You need frequent blood draws for lab tests.   You need dialysis.  Implanted ports are usually placed in the chest area, but they can also be placed in the upper arm, the abdomen, or the leg. An implanted port has two main parts:   Reservoir. The reservoir is round and will appear as a small, raised area under your skin. The reservoir is the part where a needle is inserted to give medicines or draw blood.   Catheter. The catheter is a thin, flexible tube that extends from the reservoir. The catheter is placed into a large vein. Medicine that is inserted into the reservoir goes into the catheter and then into the vein.  HOW WILL I CARE FOR MY INCISION SITE? Do not get the incision site wet. Bathe or shower as directed by your health care provider.  HOW IS MY PORT ACCESSED? Special steps must be taken to access the port:   Before the port is accessed, a numbing cream can be placed on the skin. This helps numb the skin over the port site.   Your health care provider uses a sterile technique to access the port.  Your health care provider must put on a mask and sterile gloves.  The skin over your port is cleaned carefully with an antiseptic and allowed to dry.  The port is gently pinched between sterile gloves, and a needle is inserted into the port.  Only "non-coring" port needles  should be used to access the port. Once the port is accessed, a blood return should be checked. This helps ensure that the port is in the vein and is not clogged.   If your port needs to remain accessed for a constant infusion, a clear (transparent) bandage will be placed over the needle site. The bandage and needle will need to be changed every week, or as directed by your health care provider.   Keep the bandage covering the needle clean and dry. Do not get it wet. Follow your health care provider's instructions on how to take a shower or bath while the port is accessed.   If your port does not need to stay accessed, no bandage is needed over the port.  WHAT IS FLUSHING? Flushing helps keep the port from getting clogged. Follow your health care provider's instructions on how and when to flush the port. Ports are usually flushed with saline solution or a medicine called heparin. The need for flushing will depend on how the port is used.   If the port is used for intermittent medicines or blood draws, the port will need to be flushed:   After medicines have been given.   After blood has been drawn.   As part of routine maintenance.   If a constant infusion is running, the port may not need to be flushed.  HOW  LONG WILL MY PORT STAY IMPLANTED? The port can stay in for as long as your health care provider thinks it is needed. When it is time for the port to come out, surgery will be done to remove it. The procedure is similar to the one performed when the port was put in.  WHEN SHOULD I SEEK IMMEDIATE MEDICAL CARE? When you have an implanted port, you should seek immediate medical care if:   You notice a bad smell coming from the incision site.   You have swelling, redness, or drainage at the incision site.   You have more swelling or pain at the port site or the surrounding area.   You have a fever that is not controlled with medicine. Document Released: 12/18/2004 Document  Revised: 10/08/2012 Document Reviewed: 08/25/2012 [facility] Patient Information 2015 [place], [place]. This information is not intended to replace advice given to you by your health care provider. Make sure you discuss any questions you have with your health care provider.

## 2014-09-22 ENCOUNTER — Encounter: Payer: Self-pay | Admitting: Hematology and Oncology

## 2014-09-22 ENCOUNTER — Ambulatory Visit (HOSPITAL_BASED_OUTPATIENT_CLINIC_OR_DEPARTMENT_OTHER): Payer: Medicare Other | Admitting: Hematology and Oncology

## 2014-09-22 ENCOUNTER — Telehealth: Payer: Self-pay | Admitting: Hematology and Oncology

## 2014-09-22 ENCOUNTER — Other Ambulatory Visit: Payer: Self-pay | Admitting: Radiation Oncology

## 2014-09-22 ENCOUNTER — Telehealth: Payer: Self-pay | Admitting: *Deleted

## 2014-09-22 VITALS — BP 102/80 | HR 84 | Temp 98.1°F | Resp 18 | Ht 73.0 in | Wt 166.4 lb

## 2014-09-22 DIAGNOSIS — E43 Unspecified severe protein-calorie malnutrition: Secondary | ICD-10-CM

## 2014-09-22 DIAGNOSIS — F1996 Other psychoactive substance use, unspecified with psychoactive substance-induced persisting amnestic disorder: Secondary | ICD-10-CM

## 2014-09-22 DIAGNOSIS — Z931 Gastrostomy status: Secondary | ICD-10-CM | POA: Diagnosis not present

## 2014-09-22 DIAGNOSIS — R2689 Other abnormalities of gait and mobility: Secondary | ICD-10-CM | POA: Insufficient documentation

## 2014-09-22 DIAGNOSIS — C01 Malignant neoplasm of base of tongue: Secondary | ICD-10-CM | POA: Diagnosis present

## 2014-09-22 DIAGNOSIS — D630 Anemia in neoplastic disease: Secondary | ICD-10-CM | POA: Diagnosis not present

## 2014-09-22 DIAGNOSIS — Z23 Encounter for immunization: Secondary | ICD-10-CM

## 2014-09-22 DIAGNOSIS — R634 Abnormal weight loss: Secondary | ICD-10-CM

## 2014-09-22 DIAGNOSIS — R29818 Other symptoms and signs involving the nervous system: Secondary | ICD-10-CM

## 2014-09-22 MED ORDER — INFLUENZA VAC SPLIT QUAD 0.5 ML IM SUSY
0.5000 mL | PREFILLED_SYRINGE | Freq: Once | INTRAMUSCULAR | Status: AC
  Administered 2014-09-22: 0.5 mL via INTRAMUSCULAR
  Filled 2014-09-22: qty 0.5

## 2014-09-22 NOTE — Telephone Encounter (Signed)
Pt confirmed labs/ov per 09/21 POF, gave pt AVS and Calendar... KJ °

## 2014-09-22 NOTE — Assessment & Plan Note (Signed)
He is still recovering from side effects of treatment. He wants to keep the port and feeding tube for now. I will schedule port flushes in 6-8 weeks and see him back at the end of the year, in 3 months with repeat history, physical examination, blood work & CT scan of the neck. In the meantime, I recommend frequent ENT follow-up

## 2014-09-22 NOTE — Assessment & Plan Note (Signed)
He has balance difficulties with near fall episodes recently. He is not using his cane correctly and needs to be reminded frequently. He will continue physical therapy Dylan Moreno

## 2014-09-22 NOTE — Assessment & Plan Note (Signed)
This is likely anemia of chronic disease. The patient denies recent history of bleeding such as epistaxis, hematuria or hematochezia. He is asymptomatic from the anemia. We will observe for now.  He does not require transfusion now.   

## 2014-09-22 NOTE — Assessment & Plan Note (Signed)
There is very mild leak around the feeding tube. I recommend he put some clean gauze around the feeding tube. Clinically, the site does not look infected and there is no evidence of cellulitis. 

## 2014-09-22 NOTE — Assessment & Plan Note (Signed)
The patient is able to eat but he is not eating on a regular basis.   He has to be reminded frequently. He has been dependent on Ensure for adequate calorie intake and has lost some weight since he was seen here. I gave him of prescription for Ensure and recommended we keep the feeding tube for now.

## 2014-09-22 NOTE — Assessment & Plan Note (Signed)
He has persistent cognitive impairment since his treatment. It was suggested by the neurologist for him to see a neuropsychiatry for cognitive evaluation and management. He agrees for the referral.

## 2014-09-22 NOTE — Progress Notes (Signed)
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    04/07/2014 - 05/05/2014 Chemotherapy He received 2 doses of high dose cisplatin. He could not received a third dose due to profound side effects.   04/28/2014 Adverse Reaction Cycle 2 chemotherapy is delayed due to neutropenia   05/25/2014 - 06/03/2014 Hospital Admission He was admitted to the hospital for management of acute delirium.   07/19/2014 Imaging PET/CT, restaging:  Resolution of tongue base lesion; significant decrease inbilateral cervical lymphadenopathy.  No definite evidence of metastatic disease.   09/21/2014 Imaging  repeat CT scan of the neck show no disease recurrence and interval regression in the cervical lymphadenopathy    INTERVAL HISTORY: Please see below for problem oriented charting.  he returns for further follow-up. His wife reports that the patient continues to struggle with memory difficulties, forgetfulness, balance difficulties with near  following episodes and refusal to eat on a regular basis. His wife have been supplementing his intake with Ensure. He denies dysphagia. No new lymphadenopathy. Denies recent infection  REVIEW OF SYSTEMS:   Constitutional: Denies fevers, chills or abnormal weight loss Eyes: Denies blurriness of vision Ears, nose, mouth, throat, and face: Denies mucositis or sore throat Respiratory: Denies cough, dyspnea or wheezes Cardiovascular: Denies palpitation, chest discomfort or lower extremity swelling Gastrointestinal:  Denies nausea, heartburn or change in bowel habits Skin: Denies abnormal skin rashes Lymphatics: Denies new lymphadenopathy or easy bruising Neurological:Denies numbness, tingling or new weaknesses Behavioral/Psych: Mood is stable, no new changes  All other systems were reviewed with the patient and are negative.  I have reviewed the past medical history, past surgical history, social history and family history with the patient and they are unchanged from previous note.  ALLERGIES:  has No Known Allergies.  MEDICATIONS:  Current Outpatient Prescriptions  Medication Sig Dispense Refill  . amphetamine-dextroamphetamine (ADDERALL) 20 MG tablet Take 20 mg by mouth daily.    Marland Kitchen atorvastatin (LIPITOR) 20 MG tablet Place 1 tablet (20 mg total) into feeding tube daily at 6 PM. (Patient taking differently: Take 20 mg by mouth daily at 6 PM. ) 30 tablet 0  . clopidogrel (PLAVIX) 75 MG tablet Place 1 tablet (75 mg total) into feeding tube daily. (Patient taking differently: Take 75 mg by mouth daily. ) 30 tablet 0  . levothyroxine (SYNTHROID, LEVOTHROID) 75 MCG tablet Place 1 tablet (75 mcg total) into feeding tube daily before breakfast. (Patient taking differently: Take 75 mcg by mouth daily before breakfast. ) 30 tablet 0  .  Multiple Vitamin (MULTIVITAMIN) tablet Take 1 tablet by mouth daily.    . sodium fluoride (PREVIDENT 5000 PLUS) 1.1 % CREA dental cream Apply to tooth brush. Brush  teeth for 2 minutes. Spit out excess-DO NOT swallow. Repeat nightly. 1 Tube prn  . thiamine 100 MG tablet Take 100 mg by mouth daily.      No current facility-administered medications for this visit.    PHYSICAL EXAMINATION: ECOG PERFORMANCE STATUS: 1 - Symptomatic but completely ambulatory  Filed Vitals:   09/22/14 1038  BP: 102/80  Pulse: 84  Temp: 98.1 F (36.7 C)  Resp: 18   Filed Weights   09/22/14 1038  Weight: 166 lb 6.4 oz (75.479 kg)    GENERAL:alert, no distress and comfortable SKIN: skin color, texture, turgor are normal, no rashes or significant lesions EYES: normal, Conjunctiva are pink and non-injected, sclera clear OROPHARYNX:no exudate, no erythema and lips, buccal mucosa, and tongue normal  NECK:  There is mild lymphedema around his neck. LYMPH:  no palpable lymphadenopathy in the cervical, axillary or inguinal LUNGS: clear to auscultation and percussion with normal breathing effort HEART: regular rate & rhythm and no murmurs and no lower extremity edema ABDOMEN:abdomen soft, non-tender and normal bowel sounds. Feeding tube site looks okay Musculoskeletal:no cyanosis of digits and no clubbing  NEURO: alert & oriented x 3 with fluent speech, no focal motor/sensory deficits  LABORATORY DATA:  I have reviewed the data as listed    Component Value Date/Time   NA 135* 09/21/2014 1512   NA 132* 06/04/2014 1352   K 4.4 09/21/2014 1512   K 4.6 06/04/2014 1352   CL 96* 06/04/2014 1352   CO2 28 09/21/2014 1512   CO2 26 06/04/2014 1352   GLUCOSE 84 09/21/2014 1512   GLUCOSE 111* 06/04/2014 1352   BUN 27.7* 09/21/2014 1512   BUN 33* 06/04/2014 1352   CREATININE 0.9 09/21/2014 1512   CREATININE 1.01 06/04/2014 1352   CALCIUM 9.8 09/21/2014 1512   CALCIUM 9.5 06/04/2014 1352   PROT 6.9 09/21/2014 1512   PROT 7.0 06/04/2014 1352   ALBUMIN 3.8 09/21/2014 1512   ALBUMIN 3.0* 06/04/2014 1352   AST 22 09/21/2014 1512   AST 61* 06/04/2014 1352   ALT 24  09/21/2014 1512   ALT 93* 06/04/2014 1352   ALKPHOS 100 09/21/2014 1512   ALKPHOS 109 06/04/2014 1352   BILITOT 0.54 09/21/2014 1512   BILITOT 0.4 06/04/2014 1352   GFRNONAA >60 06/04/2014 1352   GFRAA >60 06/04/2014 1352    No results found for: SPEP, UPEP  Lab Results  Component Value Date   WBC 4.7 07/21/2014   NEUTROABS 3.9 07/21/2014   HGB 12.9* 07/21/2014   HCT 38.5 07/21/2014   MCV 89.5 07/21/2014   PLT 221 07/21/2014      Chemistry      Component Value Date/Time   NA 135* 09/21/2014 1512   NA 132* 06/04/2014 1352   K 4.4 09/21/2014 1512   K 4.6 06/04/2014 1352   CL 96* 06/04/2014 1352   CO2 28 09/21/2014 1512   CO2 26 06/04/2014 1352   BUN 27.7* 09/21/2014 1512   BUN 33* 06/04/2014 1352   CREATININE 0.9 09/21/2014 1512   CREATININE 1.01 06/04/2014 1352      Component Value Date/Time   CALCIUM 9.8 09/21/2014 1512   CALCIUM 9.5 06/04/2014 1352   ALKPHOS 100 09/21/2014 1512   ALKPHOS 109 06/04/2014 1352   AST 22 09/21/2014 1512   AST 61* 06/04/2014 1352  ALT 24 09/21/2014 1512   ALT 93* 06/04/2014 1352   BILITOT 0.54 09/21/2014 1512   BILITOT 0.4 06/04/2014 1352       RADIOGRAPHIC STUDIES: I have personally reviewed the radiological images as listed and agreed with the findings in the report. 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 Restaging. Subsequent encounter.  EXAM: CT NECK WITH CONTRAST  TECHNIQUE: Multidetector CT imaging of the neck was performed using the standard protocol following the bolus administration of intravenous contrast.  CONTRAST:  71mL OMNIPAQUE IOHEXOL 300 MG/ML  SOLN  COMPARISON:  PET-CT 07/19/2014. Head and neck CTA 06/04/2014. Neck CT 05/27/2014, and earlier  FINDINGS: Pharynx and larynx: Continued diffuse pharyngeal mucosal space soft tissue thickening compatible  with radiation sequelae. No residual tongue base mass, with volume loss near the midline where hypermetabolism was demonstrated on 03/18/2014. Residual lingual tonsil. Stable parapharyngeal and retropharyngeal spaces.  Salivary glands: Post radiation changes to these submandibular and parotid glands. Negative sublingual space.  Thyroid: Negative.  Lymph nodes: Widespread soft tissue space post radiation changes in the neck. Regressed malignant lymphadenopathy at the left level 2 and level 3 stations. Residual left level 2 soft tissue measuring 13 mm short axis (versus 25 mm in May). Residual nodes at the left level III station measuring 9 mm up to short axis (14 mm in May). Diminutive partially calcified residual node at the right level IIa station measuring 4 mm on series 2, image 39. No new or increased cervical lymph nodes.  Vascular: Partially visible right chest IJ approach porta cath. Major vascular structures in the neck and at the skullbase appear to remain patent. There is significant bilateral carotid bifurcation atherosclerosis.  Limited intracranial: Negative.  Visualized orbits: Not included.  Mastoids and visualized paranasal sinuses: Stable mild right maxillary alveolar recess mucosal thickening. Trace right mastoid effusion not significantly changed.  Skeleton: No acute or suspicious osseous lesion identified in the neck. Cervical spine degeneration.  Upper chest: Paraseptal and centrilobular emphysema greater in the right lung apex. Progressed apical lung scarring greater on the right. No superior mediastinal or axillary lymphadenopathy.  IMPRESSION: 1. Continued positive response to treatment. Regressed but not completely resolved left side level 2 and level 3 lymphadenopathy, residual up to 13 mm short axis. 2. No residual base of tongue tumor identified.   Electronically Signed   By: Genevie Ann M.D.   On: 09/21/2014 16:47     ASSESSMENT & PLAN:  Malignant neoplasm of base of tongue  He is still  recovering from side effects of treatment. He wants to keep the port and feeding tube for now. I will schedule port flushes in 6-8 weeks and see him back at the end of the year, in 3 months with repeat history, physical examination, blood work & CT scan of the neck. In the meantime, I recommend frequent ENT follow-up  Anemia in neoplastic disease This is likely anemia of chronic disease. The patient denies recent history of bleeding such as epistaxis, hematuria or hematochezia. He is asymptomatic from the anemia. We will observe for now.  He does not require transfusion now.    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 It was suggested by the neurologist for him to see a neuropsychiatry for cognitive evaluation and management. He agrees for the referral.  Balance problem  He has  balance difficulties with near fall episodes recently. He is not using his cane correctly and needs to be reminded frequently. He will continue physical therapy /rehabilitation  Protein-calorie malnutrition, severe  The patient is able to eat but he is not eating on a regular basis.   He has to be reminded frequently. He has been dependent on Ensure for adequate calorie intake and has lost some weight since he was seen here. I gave him of prescription for Ensure and recommended we keep the feeding tube for now.  PEG (percutaneous endoscopic gastrostomy) status There is very mild leak around the feeding tube. I recommend he put some clean gauze around the feeding tube. Clinically, the site does not look infected and there is no evidence of cellulitis.     Orders Placed This Encounter  Procedures  . CT Soft Tissue Neck W Contrast    Standing Status: Future     Number of Occurrences:      Standing Expiration Date: 12/23/2015    Order Specific Question:  Reason for Exam (SYMPTOM  OR DIAGNOSIS REQUIRED)    Answer:  tongue cancer, lymphadenopathy, need to document  response    Order Specific Question:  Preferred imaging location?    Answer:  [facility]  . CBC with Differential/Platelet    Standing Status: Future     Number of Occurrences:      Standing Expiration Date: 12/23/2015   All questions were answered. The patient knows to call the clinic with any problems, questions or concerns. No barriers to learning was detected. I spent 25 minutes counseling the patient face to face. The total time spent in the appointment was 30 minutes and more than 50% was on counseling and review of test results     [facility], [place], MD 09/22/2014 12:52 PM

## 2014-09-22 NOTE — Telephone Encounter (Signed)
CALLED PATIENT TO INFORM OF LAB AND FU FOR 11-05-14, LVM FOR A RETURN CALL

## 2014-09-23 ENCOUNTER — Ambulatory Visit: Payer: Medicare Other

## 2014-09-23 DIAGNOSIS — Z9189 Other specified personal risk factors, not elsewhere classified: Secondary | ICD-10-CM

## 2014-09-23 DIAGNOSIS — M436 Torticollis: Secondary | ICD-10-CM

## 2014-09-23 DIAGNOSIS — I890 Lymphedema, not elsewhere classified: Secondary | ICD-10-CM

## 2014-09-23 DIAGNOSIS — R1310 Dysphagia, unspecified: Secondary | ICD-10-CM

## 2014-09-23 DIAGNOSIS — R293 Abnormal posture: Secondary | ICD-10-CM | POA: Diagnosis not present

## 2014-09-23 NOTE — Therapy (Signed)
[place], [place], [address] Phone: [phone]   Fax:  [phone]  Physical Therapy Treatment  Patient Details  Name: Dylan Moreno MRN: [id number] Date of Birth: June 20, 1940 Referring Provider:  Heath Lark, MD  Encounter Date: 09/23/2014      PT End of Session - 09/23/14 1102    Visit Number 13   Number of Visits 16   Date for PT Re-Evaluation 10/07/14   PT Start Time 1019   PT Stop Time 1102   PT Time Calculation (min) 43 min   Activity Tolerance Patient tolerated treatment well   Behavior During Therapy [facility] for tasks assessed/performed      Past Medical History  Diagnosis Date  . Hyperlipemia   . Arthritis   . ADHD (attention deficit hyperactivity disorder)   . Wears glasses   . Hypercholesterolemia   . Peyronie's disease   . Kidney stones   . TIA (transient ischemic attack)   . Hypertension   . Anxiety   . GERD (gastroesophageal reflux disease)   . 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 TIA (transient ischemic attack) 12/29/12    left facial droop  . Dysuria 07/15/2014    Past Surgical History  Procedure Laterality Date  . Dupuytren contracture release  2012    left hand  . Tonsillectomy    . Shoulder arthroscopy w/ rotator cuff repair      left  . Colonoscopy    . Cystoscopy w/ ureteroscopy w/ lithotripsy  2011  . Hand surgery      right  . Dupuytren contracture release Right 10/30/2012    Procedure: DUPUYTREN CONTRACTURE RELEASE RIGHT PALM,RING AND SMALL FINGER;  Surgeon: Cammie Sickle., MD;  Location: [place];  Service: Orthopedics;  Laterality: Right;    There were no vitals filed for this visit.  Visit Diagnosis:  Lymphedema  Stiffness of neck  Poor posture  At risk for lymphedema  Dysphagia      Subjective Assessment - 09/23/14 1026    Subjective I can't believe how much the chip pack helps.    Currently  in Pain? No/denies               LYMPHEDEMA/ONCOLOGY QUESTIONNAIRE - 09/23/14 1026    Head and Neck   4 cm superior to sternal notch around neck 41.5 cm   6 cm superior to sternal notch around neck 42.4 cm   8 cm superior to sternal notch around neck 44 cm   Other 56.7 cm around hip below ears and above lips                  [facility] Adult PT Treatment/Exercise - 09/23/14 0001    Manual Therapy   Manual Lymphatic Drainage (MLD) diaphragmatic breathing, supraclavicular fossae, bilateral shoulder collectors, bilateral axillae and anterior chest; lateral, anterolateral, and anterior neck; posterior neck, chin and cheeks.; continued to instruct patient as manual lymph drainage was performed and answered his many questions about this                   Short Term Clinic Goals - 09/23/14 1206    CC Short Term Goal  #1   Title Patient will be able to demonstrate proper technique with initial HEP to increase cervical ROM for promoting lymphatic circulation   Status Achieved   CC Short Term Goal  #2   Title Patient will demonstrate  a reduction in neck swelling by >/= 0.5 cm at 8 cm proximal to sternal notch around neck  2.5 cm reduction attained 2014-09-28   Status Achieved   CC Short Term Goal  #3   Title Patient's wife or caregiver will demonstrate proper technique for doing manual lymph drainage with some verbal and tactile cues.   Status Achieved             Long Term Clinic Goals - 09-28-14 1205    CC Long Term Goal  #1   Title Patient will be able to demonstrate proper technique with final HEP and verbalize safe self progression to increase cervical ROM for promoting lymphatic circulation   Status Achieved   CC Long Term Goal  #2   Title Patient will demonstrate a reduction in neck swelling by >/= 1 cm at 8 cm proximal to sternal notch around neck  2.5 cm reduction attained September 28, 2014   CC Long Term Goal  #3   Title Patient's wife or caregiver will  demonstrate proper technique for doing manual lymph drainage without verbal and tactile cues.  Pt demonstrates good technique with this and wife/caregiver has been instructed in this .   Status Achieved   CC Long Term Goal  #4   Title Patient and his wife/caregiver will verbalize where and how to be fitted for a head and neck compression garment if they decide to proceed with that.   Status Achieved           Plan - 09/28/2014 1103    Clinical Impression Statement Pt has made reat progress towards all his goals and has made great reductions with all of his circumference measurements. He reports feeling ready for discharge as he is comfortable with the Maintenance Phase of treatment and his compression garment should be arriving in next 1-2 weeks but is compliant with the chip pack as well which gives him great reductions.    Pt will benefit from skilled therapeutic intervention in order to improve on the following deficits Increased edema;Decreased range of motion;Postural dysfunction   Rehab Potential Good   Clinical Impairments Affecting Rehab Potential Poor memory and attention   PT Frequency 2x / week   PT Duration 8 weeks   PT Treatment/Interventions Manual lymph drainage;Manual techniques   PT Next Visit Plan Discharge this visit, see summary.   Consulted and Agree with Plan of Care Patient          G-Codes - Sep 28, 2014 1842    Functional Assessment Tool Used Clinical Judgement   Functional Limitation Self care   Self Care Goal Status [phone]) At least 1 percent but less than 20 percent impaired, limited or restricted   Self Care Discharge Status [phone]) At least 1 percent but less than 20 percent impaired, limited or restricted      Problem List Patient Active Problem List   Diagnosis Date Noted  . Balance problem 09/22/2014  . 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 Dysuria 07/15/2014  . Dysphagia 06/17/2014  . Drug-induced memory loss 06/17/2014  . Cerebral ventriculomegaly  06/08/2014  . PEG (percutaneous endoscopic gastrostomy) status 06/08/2014  . Alcohol abuse 06/08/2014  . Dilated pupil 06/04/2014  . Aspiration into airway   . Acute delirium 06/02/2014  . Tongue swelling   . Hyponatremia   . Protein-calorie malnutrition, severe 05/26/2014  . Subacute delirium 05/25/2014  . 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 Confusion caused by a drug 05/20/2014  . Protein calorie malnutrition 05/20/2014  . Insomnia  05/10/2014  . Leukopenia due to antineoplastic chemotherapy 04/28/2014  . Mucositis due to chemotherapy 04/22/2014  . Dehydration 04/22/2014  . Weight loss 04/14/2014  . Chemotherapy-induced nausea 04/14/2014  . 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  . TIA (transient ischemic attack) 12/29/2012  . Hyperlipemia   . ADHD (attention deficit hyperactivity disorder)    Collie Siad, PTA  [place], [place], [address] Phone: [phone]   Fax:  [phone]   PHYSICAL THERAPY DISCHARGE SUMMARY  Visits from Start of Care: 13  Current functional level related to goals / functional outcomes: Patient's face and neck swelling has reduced significantly.  He continues to require daily management of his swelling to maintain his reduction but is competent with that.   Remaining deficits: None related to lymphedema.  He has other deficits related to balance, falls, weakness, posture, etc that are being addressed at [facility] per his report.   Education / Equipment: Jovi Pack for neck swelling and several chip packs for reducing swelling.  Plan: Patient agrees to discharge.  Patient goals were   met. Patient is being discharged due to meeting the stated rehab goals.  ?????        Annia Friendly, [place] 09/23/2014 6:45 PM

## 2014-09-24 DIAGNOSIS — H6123 Impacted cerumen, bilateral: Secondary | ICD-10-CM | POA: Diagnosis not present

## 2014-09-24 DIAGNOSIS — M6281 Muscle weakness (generalized): Secondary | ICD-10-CM | POA: Diagnosis not present

## 2014-09-24 DIAGNOSIS — Z923 Personal history of irradiation: Secondary | ICD-10-CM | POA: Diagnosis not present

## 2014-09-24 DIAGNOSIS — Z85810 Personal history of malignant neoplasm of tongue: Secondary | ICD-10-CM | POA: Diagnosis not present

## 2014-09-24 DIAGNOSIS — R1319 Other dysphagia: Secondary | ICD-10-CM | POA: Diagnosis not present

## 2014-09-24 DIAGNOSIS — R262 Difficulty in walking, not elsewhere classified: Secondary | ICD-10-CM | POA: Diagnosis not present

## 2014-09-27 ENCOUNTER — Encounter: Payer: Self-pay | Admitting: Physical Therapy

## 2014-09-27 DIAGNOSIS — M6281 Muscle weakness (generalized): Secondary | ICD-10-CM | POA: Diagnosis not present

## 2014-09-27 DIAGNOSIS — R262 Difficulty in walking, not elsewhere classified: Secondary | ICD-10-CM | POA: Diagnosis not present

## 2014-09-28 ENCOUNTER — Other Ambulatory Visit: Payer: Self-pay | Admitting: Otolaryngology

## 2014-09-28 DIAGNOSIS — H6121 Impacted cerumen, right ear: Secondary | ICD-10-CM

## 2014-09-28 DIAGNOSIS — R1319 Other dysphagia: Secondary | ICD-10-CM

## 2014-09-29 ENCOUNTER — Ambulatory Visit
Admission: RE | Admit: 2014-09-29 | Discharge: 2014-09-29 | Disposition: A | Discharge status: Home / Self Care | Payer: Medicare Other | Source: Ambulatory Visit | Attending: Otolaryngology | Admitting: Otolaryngology

## 2014-09-29 DIAGNOSIS — R1319 Other dysphagia: Secondary | ICD-10-CM

## 2014-09-29 DIAGNOSIS — K224 Dyskinesia of esophagus: Secondary | ICD-10-CM | POA: Diagnosis not present

## 2014-09-29 DIAGNOSIS — H6121 Impacted cerumen, right ear: Secondary | ICD-10-CM

## 2014-09-30 ENCOUNTER — Ambulatory Visit
Admission: RE | Admit: 2014-09-30 | Discharge: 2014-09-30 | Disposition: A | Discharge status: Home / Self Care | Payer: Medicare Other | Source: Ambulatory Visit | Attending: Neurology | Admitting: Neurology

## 2014-09-30 ENCOUNTER — Encounter: Payer: Self-pay | Admitting: Physical Therapy

## 2014-09-30 ENCOUNTER — Ambulatory Visit (INDEPENDENT_AMBULATORY_CARE_PROVIDER_SITE_OTHER): Payer: Medicare Other | Admitting: Neurology

## 2014-09-30 ENCOUNTER — Encounter: Payer: Self-pay | Admitting: Neurology

## 2014-09-30 VITALS — BP 120/74 | HR 80 | Ht 73.0 in | Wt 166.4 lb

## 2014-09-30 DIAGNOSIS — R413 Other amnesia: Secondary | ICD-10-CM

## 2014-09-30 DIAGNOSIS — G621 Alcoholic polyneuropathy: Secondary | ICD-10-CM

## 2014-09-30 DIAGNOSIS — R269 Unspecified abnormalities of gait and mobility: Secondary | ICD-10-CM | POA: Diagnosis not present

## 2014-09-30 DIAGNOSIS — M5126 Other intervertebral disc displacement, lumbar region: Secondary | ICD-10-CM | POA: Diagnosis not present

## 2014-09-30 DIAGNOSIS — R29898 Other symptoms and signs involving the musculoskeletal system: Secondary | ICD-10-CM

## 2014-09-30 DIAGNOSIS — G459 Transient cerebral ischemic attack, unspecified: Secondary | ICD-10-CM

## 2014-09-30 NOTE — Progress Notes (Signed)
Follow-up Visit   Date: 09/30/2014    Dylan Moreno MRN: [id number] DOB: January 27, 1940   Interim History: Dylan Moreno is a 74 y.o. right-handed Caucasian male with history of hyperlipidemia, tobacco use, ADD, TIA (12/29/2012), squamous cell carcinoma s/p chemo/radiation (Spring 2016) returning to the clinic for complaints of memory impairment and new complaints of imbalance.  The patient was accompanied to the clinic by wife and son.  History of present illness: He presented to the Emergency Department on 12/29/2012 with acute onset of left facial droop, lasting one hour. He woke up that morning feeling dizzy and lightheaded. His son, who is a Agricultural consultant, noticed his facial droop and brought him to the ED for evaluation. He CT of the brain, MRI/A of the brain which did not show any acute stroke. There was a note of ventriculomegaly suggestive of NPH. US carotids showed patent ICAs and echocardiogram did not reveal an embolic source. He was previously taking lipitor 10mg  and was taking aspirin 81mg  daily. He was discharged on aspirin 325mg .  No prior personal or family history of stroke or TIA.   - Follow-up 05/04/2013:  At his last visit, I switched him to plavix.  48-hr holter testing did not show any arrhythmias.   He is tolerating it well and denies any new neurological symptoms. He is trying to cut back on smoking cigars.   UPDATE 08/04/2013:  He is doing well and has no new neurological symptoms.    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 He also underwent PEG tube placement. He was admitted to [facility] 5/45 - 06/03/2014 because of agitation, hallucinations, and delirium which had been ongoing for a week prior to presentation and thought to be due to combination of pain medications.  He also stopped taking  Adderal one week prior to symptom onset  While hospitalized, his pain medications were slowly reduced and he started becoming less agitated.  He was discharged to rehab facility on 6/2.  The following day, patient's family noted that his right pupil was larger so was taken to the emergency department where neurology evaluated him. Exam was notable for right pupil 6 mm and left pupil 3 mm, round, reactive to light and accommodation--no APD.  CTA did not demonstrate any vascular abnormalities.  Pupil asymmetry lasted about 2 days.  Over the past week, family has noticed a huge improvement and say that he is almost back to his baseline but continues to have memory problems.  He cannot keep up with reading as well and reports to reading 5 hours per day.  His son feels that he does not remember to focus on tasks such as what is taught in PT (how to stand/sit, etc).    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   UPDATE 09/30/2014:  He feels that he is doing well, but his wife and son feel that his memory is worse and balance is a problem.  He is doing out-patient therapy and is using a cane.  He has fallen 2-3 times, with superficial injuries.  He is not complaint with his home exercises and family feels this is partly the issue for his lack of improvement.   He is starting to read better than before and is  able to follow the plot.  He is not driving.  He is not managing his personal finances.  He does not remember appointments or tasks.  Patient is argumentative and disagrees with family's concerns.   Family are also concerned that he is not getting enough nutrition because is PO intake is poor.    Medications:  Current Outpatient Prescriptions on File Prior to Visit  Medication Sig Dispense Refill  . amphetamine-dextroamphetamine (ADDERALL) 20 MG tablet Take 20 mg by mouth daily.    Marland Kitchen atorvastatin (LIPITOR) 20 MG tablet Place 1 tablet (20 mg total) into feeding  tube daily at 6 PM. (Patient taking differently: Take 20 mg by mouth daily at 6 PM. ) 30 tablet 0  . clopidogrel (PLAVIX) 75 MG tablet Place 1 tablet (75 mg total) into feeding tube daily. (Patient taking differently: Take 75 mg by mouth daily. ) 30 tablet 0  . levothyroxine (SYNTHROID, LEVOTHROID) 75 MCG tablet Place 1 tablet (75 mcg total) into feeding tube daily before breakfast. (Patient taking differently: Take 75 mcg by mouth daily before breakfast. ) 30 tablet 0  . sodium fluoride (PREVIDENT 5000 PLUS) 1.1 % CREA dental cream Apply to tooth brush. Brush teeth for 2 minutes. Spit out excess-DO NOT swallow. Repeat nightly. 1 Tube prn  . thiamine 100 MG tablet Take 100 mg by mouth daily.      No current facility-administered medications on file prior to visit.    Allergies: No Known Allergies   Review of Systems:  CONSTITUTIONAL: No fevers, chills, night sweats, or weight loss.   EYES: No visual changes or eye pain ENT: No hearing changes.  No history of nose bleeds.   RESPIRATORY: No cough, wheezing and shortness of breath.   CARDIOVASCULAR: Negative for chest pain, and palpitations.   GI: Negative for abdominal discomfort, blood in stools or black stools.  No recent change in bowel habits.   GU:  No history of incontinence.   MUSCLOSKELETAL: No history of joint pain or swelling.  No myalgias.   SKIN: Negative for lesions, rash, and itching.   ENDOCRINE: Negative for cold or heat intolerance, polydipsia or goiter.   PSYCH:  No depression or anxiety symptoms.   NEURO: As Above.   Vital Signs:  BP 120/74 mmHg  Pulse 80  Ht 6\' 1"  (1.854 m)  Wt 166 lb 6 oz (75.467 kg)  BMI 21.96 kg/m2  SpO2 96%  Neurological Exam: MENTAL STATUS:  Alert, awake, and easily engages in conversation.  Good eye contact.  Well groomed and dressed. Oriented x5. Speech is not dysarthric, but there is intermittent stuttering of speech (old).  Montreal Cognitive Assessment  09/30/2014 06/17/2014    Visuospatial/ Executive (0/5) 5 2  Naming (0/3) 3 2  Attention: Read list of digits (0/2) 2 2  Attention: Read list of letters (0/1) 1 1  Attention: Serial 7 subtraction starting at 100 (0/3) 3 1  Language: Repeat phrase (0/2) 2 2  Language : Fluency (0/1) 1 0  Abstraction (0/2) 1 1  Delayed Recall (0/5) 1 0  Orientation (0/6) 5 6  Total 24 17  Adjusted Score (based on education) 24 17    CRANIAL NERVES: Pupils round and reactive to light.  Normal conjugate, extra-ocular eye movements in all directions of gaze.  No ptosis. Normal facial sensation.  Face is symmetric. Palate elevates symmetrically.  Tongue is midline.  MOTOR:  Motor strength is 5/5 in all extremities, except mild intrinsic hand weakness bilaterally.  No pronator drift.  Several Dupuytren's contractures noted on the hands bilaterally with previous surgical scars  MSRs:  Reflexes are 1+/4 in the upper extremities, absent reflexes in the lower extremities.  Plantars are down-going.  SENSORY:  Reduced vibration at the ankles.  Rhomberg testing shows mild sway  COORDINATION/GAIT:  Normal finger-to- nose-finger Gait slightly wide-based, stable.  Easily distracted when walking looking at pictures on the wall and talking.   Data: US carotids 12/30/2012: Findings suggest 1-39% internal carotid artery stenosis bilaterally. Vertebral arteries are patent with antegrade flow.   Echo 12/30/2012:  - Left ventricle: The cavity size was normal. Systolic function was normal. The estimated ejection fraction was in the range of 60% to 65%. Wall motion was normal; there were no regional wall motion abnormalities. - Mitral valve: Calcified annulus. Mildly thickened leaflets . Mild posterior leafletprolapse.   MRI/A brain 12/29/2012:  1. Ventricular prominence is disproportionate to the degree of atrophy, suggesting normal pressure hydrocephalus.  2. No other acute intracranial abnormality. Specifically, there is no evidence for acute or  subacute infarct.  3. Mild distal small vessel disease is evident on the MRA without significant proximal stenosis, aneurysm, or branch vessel occlusion.   48-hr holter:  NSR, rare PVCs  Lab Results  Component Value Date   HGBA1C 5.8* 12/30/2012   Lab Results  Component Value Date   CHOL 190 12/30/2012   HDL 32* 12/30/2012   LDLCALC 102* 12/30/2012   TRIG 281* 12/30/2012   CHOLHDL 5.9 12/30/2012   CT head 06/04/2014: Ventricular prominence similar to prior MR suggesting normal pressure hydrocephalus. No intracranial hemorrhage. No CT evidence of large acute infarct. No intracranial mass or abnormal enhancement.  CTA head and neck 06/04/2014:   Calcified plaque with mild to slightly moderate narrowing cavernous segment internal carotid artery bilaterally. Mild narrowing and irregularity M1 segment left middle cerebral artery. Mild narrowing distal left vertebral artery.   IMPRESSION: 1.  Cognitive impairment, improved MOCA from 17/30 to 24/30 suggesting presentation in May was moreso related to medication side effects.  We need to establish his new baseline as still cannot exclude the likelihood of NPH as this may also be contributing to both cognitive dysfunction and gait problems.  Other confounding factors for his gait include alcoholic peripheral neuropathy causing sensory ataxia and possible nerve impingement of his back, as there is some proximal right leg weakness. MRI lumbar spine will be ordered.  Further, we discussed diagnosis of normal pressure hydrocephalus including lumbar puncture for opening pressure, and will make this the next step if there is no further improvement with PT.  Neuropsychological testing is scheduled for November.   2. 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  Return to clinic in 3 months  The duration of this appointment visit was 40 minutes of  face-to-face time with the patient.  Greater than 50% of this time was spent in counseling, explanation of diagnosis, planning of further management, and coordination of care.   Thank you for allowing me to participate in patient's care.  If I can answer any additional questions, I would be pleased to do so.    Sincerely,    Donika K. Posey Pronto, DO

## 2014-09-30 NOTE — Patient Instructions (Addendum)
1.  Strongly encouraged to continue home exercises 2.  MRI lumbar spine will be ordered and we will call you with the results of the testing 3.  Return to clinic in January 2017  [facility]  Preventing Falls in the Home   Falls are common, often dreaded events in the lives of older people. Aside from the obvious injuries and even death that may result, falls can cause wide-ranging consequences including loss of independence, mental decline, decreased activity, and mobility. Younger people are also at risk of falling, especially those with chronic illnesses and fatigue.  Ways to reduce the risk for falling:  * Examine diet and medications. Warm foods and alcohol dilate blood vessels, which can lead to dizziness when standing. Sleep aids, antidepressants, and pain medications can also increase the likelihood of a fall.  * Get a vison exam. Poor vision, cataracts, and glaucoma increase the chances of falling.  * Check foot gear. Shoes should fit snugly and have a sturdy, nonskid sole and broad, low heel.  * Participate in a physician-approved exercise program to build and maintain muscle strength and improve balance and coordination.  * Increase vitamin D intake. Vitamin D improves muscle strength and increases the amount of calcium the body is able to absorb and deposit in bones.  How to prevent falls from common hazards:  * Floors - Remove all loose wires, cords, and throw rugs. Minimize clutter. Make sure rugs are anchored and smooth. Keep furniture in its usual place.  * Chairs - Use chairs with straight backs, armrests, and firm seats. Add firm cushions to existing pieces to add height.  * Bathroom - Install grab bars and non-skid tape in the tub or shower. Use a bathtub transfer bench or a shower chair with a back support. Use an elevated toilet seat and/or safety rails to assist standing from a low surface. Do not use towel racks or bathroom tissue holders to help you stand.  * Lighting  - Make sure halls, stairways, and entrances are well-lit. Install a night light in your bathroom or hallway. Make sure there is a light switch at the top and bottom of the staircase. Turn lights on if you get up in the middle of the night. Make sure lamps or light switches are within reach of the bed if you have to get up during the night.  * Kitchen - Install non-skid rubber mats near the sink and stove. Clean spills immediately. Store frequently used utensils, pots, and pans between waist and eye level. This helps prevent reaching and bending. Sit when getting things out of the lower cupboards.  * Living room / [place] furniture with wide spaces in between, giving enough room to move around. Establish a route through the living room that gives you something to hold onto as you walk.  * Stairs - Make sure treads, rails, and rugs are secure. Install a rail on both sides of the stairs. If stairs are a threat, it might be helpful to arrange most of your activities on the lower level to reduce the number of times you must climb the stairs.  * Entrances and doorways - Install metal handles on the walls adjacent to the doorknobs of all doors to make it more secure as you travel through the doorway.  Tips for maintaining balance:  * Keep at least one hand free at all times Try using a backpack or fanny pack to hold things rather than carrying them in your hands.  Never carry objects in both hands when walking as this interferes with keeping your balance.  * Attempt to swing both arms from front to back while walking. This might require a conscious effort if Parkinson's disease has diminished your movement. It will, however, help you to maintain balance and posture, and reduce fatigue.  * Consciously lift your feet off the ground when walking. Shuffling and dragging of the feet is a common culprit in losing your balance.  * When trying to navigate turns, use a "U" technique of facing forward and making a  wide turn, rather than pivoting sharply.  * Try to stand with your feet shoulder-length apart. When your feet are close together for any length of time, you increase your risk of losing your balance and falling.  * Do one thing at a time. Do not try to walk and accomplish another task, such as reading or looking around. The decrease in your automatic reflexes complicates motor function, so the less distraction, the better.  * Do not wear rubber or gripping soled shoes, they might "catch" on the floor and cause tripping.  * Move slowly when changing positions. Use deliberate, concentrated movements and, if needed, use a grab bar or walking aid. Count fifteen (15) seconds after standing to begin walking.  * If balance is a continuous problem, you might want to consider a walking aid such as a cane, walking stick, or walker. Once you have mastered walking with help, you may be ready to try it again on your own.  This information is provided by [facility] and is not intended to replace the medical advice of your physician or other health care providers. Please consult your physician or other health care providers for advice regarding your specific medical condition.

## 2014-10-01 DIAGNOSIS — R262 Difficulty in walking, not elsewhere classified: Secondary | ICD-10-CM | POA: Diagnosis not present

## 2014-10-01 DIAGNOSIS — M6281 Muscle weakness (generalized): Secondary | ICD-10-CM | POA: Diagnosis not present

## 2014-10-04 ENCOUNTER — Telehealth: Payer: Self-pay | Admitting: *Deleted

## 2014-10-04 ENCOUNTER — Telehealth: Payer: Self-pay | Admitting: Neurology

## 2014-10-04 NOTE — Telephone Encounter (Signed)
  Oncology Nurse Navigator Documentation   Navigator Encounter Type: Telephone (10/04/14 1031)         Interventions: Coordination of Care (10/04/14 1031)     Patient wife returned my call, confirmed he has an appt with Dr. Antionette Poles, Clinical Psychologist, on 11/22/14.  Gayleen Orem, RN, BSN, [place] [place] [phone]           Time Spent with Patient: 15 (10/04/14 1031)

## 2014-10-04 NOTE — Telephone Encounter (Signed)
PT's wife Lelon Frohlich called and said her husband is insisting on driving and she does not feel it is safe for him to drive and would like Dr Posey Pronto to let him know that/Dawn CB# [phone]

## 2014-10-04 NOTE — Telephone Encounter (Signed)
Pt wife/Anna/ doesn't feel that it is no longer safe for him to drive/call back @ [phone]

## 2014-10-04 NOTE — Telephone Encounter (Signed)
Your last note states that he is not driving.  Is this your recommendation?

## 2014-10-04 NOTE — Telephone Encounter (Signed)
Please let him know that I do not recommend that he drive, especially since family does not feel it is safe.  If he disagrees, he is welcome to undergo driving assessment testing but this unfortunately is not usually paid by insurance.  Donika K. Posey Pronto, DO

## 2014-10-04 NOTE — Telephone Encounter (Signed)
Spoke with patient's wife and gave her the information per Dr. Posey Pronto.

## 2014-10-05 DIAGNOSIS — M6281 Muscle weakness (generalized): Secondary | ICD-10-CM | POA: Diagnosis not present

## 2014-10-05 DIAGNOSIS — R262 Difficulty in walking, not elsewhere classified: Secondary | ICD-10-CM | POA: Diagnosis not present

## 2014-10-07 DIAGNOSIS — M6281 Muscle weakness (generalized): Secondary | ICD-10-CM | POA: Diagnosis not present

## 2014-10-07 DIAGNOSIS — R2681 Unsteadiness on feet: Secondary | ICD-10-CM | POA: Diagnosis not present

## 2014-10-12 DIAGNOSIS — R262 Difficulty in walking, not elsewhere classified: Secondary | ICD-10-CM | POA: Diagnosis not present

## 2014-10-12 DIAGNOSIS — M6281 Muscle weakness (generalized): Secondary | ICD-10-CM | POA: Diagnosis not present

## 2014-10-13 ENCOUNTER — Encounter: Payer: Self-pay | Admitting: *Deleted

## 2014-10-13 NOTE — Progress Notes (Signed)
Dylan Moreno and spouse or business partner attended the 5-week Fall 2016 [facility] program (Tuesday sessions from 6-7:15 pm, [facility], beginning 09/14/14).  Gayleen Orem, RN, BSN, [place] [facility] [place] [phone]

## 2014-10-14 DIAGNOSIS — R262 Difficulty in walking, not elsewhere classified: Secondary | ICD-10-CM | POA: Diagnosis not present

## 2014-10-14 DIAGNOSIS — M6281 Muscle weakness (generalized): Secondary | ICD-10-CM | POA: Diagnosis not present

## 2014-10-18 DIAGNOSIS — R262 Difficulty in walking, not elsewhere classified: Secondary | ICD-10-CM | POA: Diagnosis not present

## 2014-10-18 DIAGNOSIS — M6281 Muscle weakness (generalized): Secondary | ICD-10-CM | POA: Diagnosis not present

## 2014-10-19 ENCOUNTER — Telehealth: Payer: Self-pay | Admitting: Neurology

## 2014-10-19 NOTE — Telephone Encounter (Signed)
Pt called concerning his driving again/ call back @ [phone]

## 2014-10-20 ENCOUNTER — Ambulatory Visit: Payer: Self-pay | Admitting: Neurology

## 2014-10-20 NOTE — Telephone Encounter (Signed)
Pt wants to know if he can start driving again please call [phone]

## 2014-10-20 NOTE — Telephone Encounter (Signed)
I spoke with patient and he is very upset about not driving.  Informed him that we could do a driving evaluation but he does not think that would work since it is already in his medical record for him not to drive.

## 2014-10-21 DIAGNOSIS — M6281 Muscle weakness (generalized): Secondary | ICD-10-CM | POA: Diagnosis not present

## 2014-10-21 DIAGNOSIS — R262 Difficulty in walking, not elsewhere classified: Secondary | ICD-10-CM | POA: Diagnosis not present

## 2014-10-21 NOTE — Telephone Encounter (Signed)
The best way to assess his driving safety is with Driving Evaluation and his chart will reflect the results of this.  I strongly recommend that he consider doing this testing, as I can understand his frustration, but safety is key.  Please provide contact information for [facility] in [place].

## 2014-10-26 DIAGNOSIS — M6281 Muscle weakness (generalized): Secondary | ICD-10-CM | POA: Diagnosis not present

## 2014-10-26 DIAGNOSIS — R262 Difficulty in walking, not elsewhere classified: Secondary | ICD-10-CM | POA: Diagnosis not present

## 2014-10-27 NOTE — Telephone Encounter (Signed)
Will send letter.

## 2014-11-02 ENCOUNTER — Other Ambulatory Visit (HOSPITAL_BASED_OUTPATIENT_CLINIC_OR_DEPARTMENT_OTHER): Payer: Medicare Other

## 2014-11-02 ENCOUNTER — Ambulatory Visit (HOSPITAL_BASED_OUTPATIENT_CLINIC_OR_DEPARTMENT_OTHER): Payer: Medicare Other

## 2014-11-02 DIAGNOSIS — Z95828 Presence of other vascular implants and grafts: Secondary | ICD-10-CM

## 2014-11-02 DIAGNOSIS — C01 Malignant neoplasm of base of tongue: Secondary | ICD-10-CM

## 2014-11-02 LAB — COMPREHENSIVE METABOLIC PANEL (CC13)
ALBUMIN: 3.9 g/dL (ref 3.5–5.0)
ALK PHOS: 97 U/L (ref 40–150)
ALT: 23 U/L (ref 0–55)
AST: 22 U/L (ref 5–34)
Anion Gap: 7 mEq/L (ref 3–11)
BILIRUBIN TOTAL: 0.87 mg/dL (ref 0.20–1.20)
BUN: 23.9 mg/dL (ref 7.0–26.0)
CALCIUM: 10 mg/dL (ref 8.4–10.4)
CO2: 25 mEq/L (ref 22–29)
Chloride: 105 mEq/L (ref 98–109)
Creatinine: 1.1 mg/dL (ref 0.7–1.3)
EGFR: 67 mL/min/{1.73_m2} — ABNORMAL LOW (ref >90)
GLUCOSE: 131 mg/dL (ref 70–140)
Potassium: 4.5 mEq/L (ref 3.5–5.1)
SODIUM: 138 meq/L (ref 136–145)
TOTAL PROTEIN: 6.9 g/dL (ref 6.4–8.3)

## 2014-11-02 LAB — MAGNESIUM (CC13): MAGNESIUM: 2.1 mg/dL (ref 1.5–2.5)

## 2014-11-02 MED ORDER — SODIUM CHLORIDE 0.9 % IJ SOLN
10.0000 mL | INTRAMUSCULAR | Status: DC | PRN
  Administered 2014-11-02: 10 mL via INTRAVENOUS
  Filled 2014-11-02: qty 10

## 2014-11-02 MED ORDER — HEPARIN SOD (PORK) LOCK FLUSH 100 UNIT/ML IV SOLN
500.0000 [IU] | Freq: Once | INTRAVENOUS | Status: AC
  Administered 2014-11-02: 500 [IU] via INTRAVENOUS
  Filled 2014-11-02: qty 5

## 2014-11-02 NOTE — Patient Instructions (Signed)

## 2014-11-03 NOTE — Progress Notes (Signed)
Dylan Moreno presents for follow up of radiation completed 05/25/14 to his tongue.   Pain issues, if any: No Using a feeding tube?: Yes. It is used for water only. He reports putting 30-32 ounces once a day. The tube site is clean, with no drainage noted, and no dressing in place.  Weight changes, if any:  Wt Readings from Last 3 Encounters:  11/05/14 165 lb 4.8 oz (74.98 kg)  09/30/14 166 lb 6 oz (75.467 kg)  09/22/14 166 lb 6.4 oz (75.479 kg)   Swallowing issues, if any: He has a difficult time taking larger pills. He is consuming a pureed diet of meat, and ensure supplements 4-7 daily. He has been asked to try to attempt different texture foods, but has not done this yet.  Smoking or chewing tobacco? No Using fluoride trays daily? He uses prevident toothpaste every 3rd brushing.  Last ENT visit was on: He saw Dr. Erik Obey one month ago, and he scoped Dylan Moreno, and also ordered a Barium Swallow test which is reported to be negative by Dylan Moreno. He has no appointments scheduled in the future.  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 He states he has no appetite, and has no desire to eat.   BP 117/68 mmHg  Pulse 75  Temp(Src) 98 F (36.7 C)  Ht 6\' 1"  (1.854 m)  Wt 165 lb 4.8 oz (74.98 kg)  BMI 21.81 kg/m2

## 2014-11-05 ENCOUNTER — Encounter: Payer: Self-pay | Admitting: Adult Health

## 2014-11-05 ENCOUNTER — Encounter: Payer: Self-pay | Admitting: Radiation Oncology

## 2014-11-05 ENCOUNTER — Ambulatory Visit
Admission: RE | Admit: 2014-11-05 | Discharge: 2014-11-05 | Disposition: A | Discharge status: Home / Self Care | Payer: Medicare Other | Source: Ambulatory Visit | Attending: Radiation Oncology | Admitting: Radiation Oncology

## 2014-11-05 ENCOUNTER — Other Ambulatory Visit: Payer: Self-pay | Admitting: Hematology and Oncology

## 2014-11-05 VITALS — BP 117/68 | HR 75 | Temp 98.0°F | Ht 73.0 in | Wt 165.3 lb

## 2014-11-05 DIAGNOSIS — C01 Malignant neoplasm of base of tongue: Secondary | ICD-10-CM | POA: Diagnosis not present

## 2014-11-05 DIAGNOSIS — R4182 Altered mental status, unspecified: Secondary | ICD-10-CM

## 2014-11-05 DIAGNOSIS — R634 Abnormal weight loss: Secondary | ICD-10-CM

## 2014-11-05 NOTE — Progress Notes (Signed)
I briefly met Mr. Dylan Moreno today during his radiation oncology visit with Dr. Isidore Moos. Dylan Moreno completed radiation therapy for cancer of the BOT on 05/25/14. Today, he has a new finding on his soft palate that may require additional work-up with Dr. Redmond Moreno (see office visit from Dr. Isidore Moos dated today, 11/05/14). I discussed with him the role of survivorship and my role in his post-treatment cancer care. I gave him a copy of the "Life After Cancer for Every Survivor" booklet, along with my business card and encouraged him to call me with any questions or concerns.   I will see him with Dr. Isidore Moos in 04/2015, as a joint visit for the patient.  He will be evaluated by Dr. Redmond Moreno in the interim regarding the new finding on physical exam today. He will be evaluated by Dr. Antionette Moreno, neuropsychologist on 11/22/14, at the recommendations of his neurologist, Dr. Posey Moreno.  Dylan Moreno is upset regarding the current recommendation by his neurology team that he not drive given his potential altered mental status/decreased judgment.  I offered support today that our hope is to maintain his safety and reinforced that we are here to continue to support he and his wife through these challenges.  He and his wife expressed verbal understanding of the proposed plan for follow-up here at the cancer center as stated above.    I encouraged he and his wife to call me with any additional concerns and we could certainly see him sooner, if needed. I look forward to participating in his care.   Mike Craze, NP [place] [phone]

## 2014-11-05 NOTE — Progress Notes (Signed)
Edit: Dr. Erik Obey is this patient's ENT physician, not Dr. Redmond Baseman.  Dr. Isidore Moos to communicate new physical exam findings to Dr. Erik Obey. Further work-up pending at the discretion of Dr. Erik Obey.   Mike Craze, NP

## 2014-11-05 NOTE — Progress Notes (Signed)
Radiation Oncology         [phone] ________________________________  Name: Dylan Moreno MRN: [id number]  Date: 11/05/2014  DOB: 07-Jun-1940  Follow-Up Visit Note  Outpatient  CC: Dylan Hams, MD  Dylan Marble, MD  Diagnosis and Prior Radiotherapy:    ICD-9-CM ICD-10-CM   1. 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  Pain issues, if any: No  Using a feeding tube?: Yes. It is used for water only. He reports putting 30-32 ounces, once a day.  Weight changes, if any: Stable Wt Readings from Last 3 Encounters:   11/05/14  165 lb 4.8 oz (74.98 kg)   09/30/14  166 lb 6 oz (75.467 kg)   09/22/14  166 lb 6.4 oz (75.479 kg)    Swallowing issues, if any: He has a difficult time taking larger pills. He is consuming a pureed diet of meat and ensure supplements 4-7 daily. He has been asked to try to attempt different texture foods, but has yet to do this. He reports an "excellent" sense of taste. Smoking or chewing tobacco? No  Using fluoride trays daily? He uses prevident toothpaste every 3rd brushing.  Last ENT visit was on: He saw Dr. Erik Moreno one month ago, scoped Mr. Dylan Moreno, and ordered a Barium Swallow test which  per report, is negative for stricture. He has no appointments scheduled in the future.  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 He states he has no appetite. The patient would like to resume driving, but was told by Dr. Posey Moreno that he should not drive at this time.  Pt underwent a CT of his neck on 09/21/14 showing continued positive response to treatment. The left  neck mass demonstrated a 13 mm short axis residual. Dr. Alvy Moreno plans to perform repeat imaging in December.   ALLERGIES:  has No Known Allergies.  Meds: Current Outpatient Prescriptions  Medication Sig Dispense Refill  . amphetamine-dextroamphetamine (ADDERALL) 20 MG tablet Take 20 mg by mouth daily.    Marland Kitchen atorvastatin (LIPITOR) 20 MG tablet Place 1 tablet (20 mg total) into feeding tube daily at 6 PM. (Patient taking differently: Take 20 mg by mouth daily at 6 PM. ) 30 tablet 0  . clopidogrel (PLAVIX) 75 MG tablet Place 1 tablet (75 mg total) into feeding tube daily. (Patient taking differently: Take 75 mg by mouth daily. ) 30 tablet 0  . levothyroxine (SYNTHROID, LEVOTHROID) 75 MCG tablet Place 1 tablet (75 mcg total) into feeding tube daily before breakfast. (Patient taking differently: Take 75 mcg by mouth daily before breakfast. ) 30 tablet 0  . sodium fluoride (PREVIDENT 5000 PLUS) 1.1 % CREA dental cream Apply to tooth brush. Brush teeth for 2 minutes. Spit out excess-DO NOT swallow. Repeat nightly. 1 Tube prn  . thiamine 100 MG tablet Take 100 mg by mouth daily.      No current facility-administered medications for this encounter.    Physical Findings: The patient is in no acute distress. Patient is alert and oriented.  height is 6\' 1"  (1.854 m) and weight is 165  lb 4.8 oz (74.98 kg). His temperature is 98 F (36.7 C). His blood pressure is 117/68 and his pulse is 75. Marland Kitchen   HEENT-   There is a fleshy growth at the juncture of the soft and hard palate at midline. It's less than about 56mm in greatest dimension and has a papillary appearance. No other oropharyngeal lesions. Mucosa is dry.  Some submental edema, also pre laryngeal. No palpable cervical or supraclavicular adenopathy. Lungs are clear to auscultation bilaterally. Heart has regular rate and rhythm.   Lab Findings: Lab Results  Component Value Date   WBC 4.7 07/21/2014   HGB 12.9* 07/21/2014   HCT 38.5 07/21/2014   MCV  89.5 07/21/2014   PLT 221 07/21/2014   Lab Results  Component Value Date   TSH 2.987 05/27/2014     Radiographic Findings: No results found.  Impression/Plan:    New palate lesion.  Refer to Dr Dylan Moreno next available, I spoke with his nurse today.  1) I advised the patient to maintain his weight for the next month and then speak with Dr. Alvy Moreno about removing his PEG tube at his next f/u with her in December.  2) I discussed the importance of physical activity and that he could sign up for the [employer] Program at his local [employer]  3) He will f/u with me in April and get his TSH rechecked. He will see Dylan Craze NP of survivorship at that time as well.  4) I advised the patient and his wife to continue massage therapy and the use of a compression sleeve on his neck for the submental edema. The patient kindly refused a referral to physical therapy.   5) The patient  is upset that he has not been released to drive yet by his physicians. I will defer to his neurologist in making this judgement call.  6) The patient was also given a "Life After Cancer for Every Survivor" booklet and our Survivorship Navigator, Dylan Craze, NP, gave the patient her contact information.  This document serves as a record of services personally performed by Dylan Gibson, MD. It was created on her behalf by Dylan Moreno, a trained medical scribe. The creation of this record is based on the scribe's personal observations and the provider's statements to them. This document has been checked and approved by the attending provider.      _____________________________________   Dylan Gibson, MD

## 2014-11-10 ENCOUNTER — Other Ambulatory Visit: Payer: Self-pay | Admitting: Otolaryngology

## 2014-11-10 DIAGNOSIS — D1039 Benign neoplasm of other parts of mouth: Secondary | ICD-10-CM | POA: Diagnosis not present

## 2014-11-10 DIAGNOSIS — Z931 Gastrostomy status: Secondary | ICD-10-CM | POA: Diagnosis not present

## 2014-11-10 DIAGNOSIS — Z85810 Personal history of malignant neoplasm of tongue: Secondary | ICD-10-CM | POA: Diagnosis not present

## 2014-11-16 ENCOUNTER — Telehealth: Payer: Self-pay | Admitting: Hematology and Oncology

## 2014-11-16 NOTE — Telephone Encounter (Signed)
s.w pt and advised on NOV appt....pt ok adn aware °

## 2014-11-22 ENCOUNTER — Ambulatory Visit: Payer: Medicare Other | Attending: Hematology and Oncology | Admitting: Psychology

## 2014-11-22 DIAGNOSIS — G3184 Mild cognitive impairment, so stated: Secondary | ICD-10-CM | POA: Diagnosis not present

## 2014-11-22 NOTE — Progress Notes (Signed)
[facility]  [address]   Telephone [phone] [address] Fax [phone] [place], [place] [address]  Initial Contact Note  Name:  Dylan Moreno Date of Birth; 04-28-1940 MRN:  [id number] Date:  11/22/2014  Dylan Moreno is an 74 y.o. male who was referred for neuropsychological evaluation by Narda Amber, DO of [facility] to assess his cognitive functioning.    A total of 5 hours was spent today reviewing medical records, interviewing (CPT [phone]) Dylan Moreno and his wife and friend, and administering and scoring neurocognitive tests (CPT [phone] & [phone].  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   A full report will follow once the planned testing has been completed. His/her next appointment is scheduled for 12/03/14.  Jamey Ripa, Ph.D Licensed Psychologist 11/22/2014

## 2014-11-23 ENCOUNTER — Telehealth: Payer: Self-pay | Admitting: *Deleted

## 2014-11-23 NOTE — Telephone Encounter (Signed)
  Oncology Nurse Navigator Documentation   Navigator Encounter Type: Telephone (11/23/14 0848)         Interventions: Coordination of Care (11/23/14 0848)       Spoke with Cephus Slater, Radiology Scheduling, re scheduling of CT neck w/ contrast in December per Dr. Calton Dach 09/22/14 order.  She indicated they had not started Dec scheduling yet, but wd give pt a call to arrange.  Gayleen Orem, RN, BSN, [place] [facility] [place] [phone]         Time Spent with Patient: 15 (11/23/14 0848)

## 2014-11-29 ENCOUNTER — Encounter: Payer: Self-pay | Admitting: *Deleted

## 2014-11-29 ENCOUNTER — Ambulatory Visit: Payer: Medicare Other | Admitting: Nutrition

## 2014-11-29 NOTE — Progress Notes (Signed)
  Oncology Nurse Navigator Documentation   Navigator Encounter Type: 6 month (11/29/14 1550) Patient Visit Type: Follow-up (11/29/14 1550)    Had 36-month post-tmt follow-up visit with patient following his nutriition appt.  He was accompanied by friend [place].  He reported:  Going to try and eat more food by mouth as he continues to lose weight.    Presently using PEG for hydration only.  Otherwise, doing well.  He denied any needs/concerns, understands he can contact me if that changes.  Gayleen Orem, RN, BSN, [place] [facility] [place] [phone]                   Time Spent with Patient: 15 (11/29/14 1550)

## 2014-11-29 NOTE — Progress Notes (Signed)
Nutrition follow-up completed with patient and close friend. Patient completed treatment for cancer at the end of May 2016. Patient is tolerating some food such as popcorn and steak but the majority of his food is pured. He was drinking oral nutrition supplements but seems to forget to drink these. Patient does taste foods which is an improvement. He denies dry mouth. Weight decreased and documented as 163.6 pounds November 28, down from 167.2 pounds in August.  Nutrition diagnosis: Inadequate oral intake continues.  Intervention: I educated patient on strategies for beginning regular soft foods and slowly weaning him off pured foods. Patient educated to continue Ensure Plus 3-4 times daily.  In addition to consuming 3 meals daily. Helped patient with a chart to help him remember to eat. Questions were answered.  Teach back method used.    Monitoring, evaluation, goals: Patient will work to increase food intake and continue oral supplements for weight gain.  Next visit: He will contact me as needed.  **Disclaimer: This note was dictated with voice recognition software. Similar sounding words can inadvertently be transcribed and this note may contain transcription errors which may not have been corrected upon publication of note.**

## 2014-11-30 ENCOUNTER — Telehealth: Payer: Self-pay | Admitting: Hematology and Oncology

## 2014-11-30 NOTE — Telephone Encounter (Signed)
lvm for pt regarding to 12.20 lab/flush moved to b4 ct scan

## 2014-12-03 ENCOUNTER — Ambulatory Visit: Payer: Medicare Other | Attending: Hematology and Oncology | Admitting: Psychology

## 2014-12-03 DIAGNOSIS — G3184 Mild cognitive impairment, so stated: Secondary | ICD-10-CM | POA: Insufficient documentation

## 2014-12-03 NOTE — Progress Notes (Addendum)
[facility]  [address]   Telephone [phone] [address] Fax [phone] [place], [place] [address]   [place]* This report should not be released without the consent of the client  Name:   Dylan Moreno Date of Birth:  [birth year] [facility] MR#:  [id number] Dates of Evaluation: 11/22/14 & 12/03/14  Reason for Referral Dylan Moreno is a 74 year-old, right handed man who was referred for neuropsychological evaluation by Narda Amber, DO of [facility] as prompted by his report of memory difficulties since the spring of 2016.   Sources of Information Electronic medical records from the [facility] were reviewed. Mr. Dennis, his wife, Ms. Lorette Ang, and longtime friend/business partner, Ms. [name], were interviewed.    Review of Medical Records Dylan Moreno presented to Dr. Posey Pronto on 06/17/14 with new complaints of memory impairment and imbalance that reportedly began in the spring of 2016. 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 He completed radiation and chemotherapy in May 2016. He was admitted to [facility] hospital in May 2016 due to hallucinations and agitation. While hospitalized, as his pain medications (fentanyl patch and morphine) were slowly reduced, his agitation abated and his mental status cleared. He was discharged to a rehabilitation facility on 06/03/14. A CT of the head on 06/04/14 showed a minimal degree of diffuse cortical atrophy as well as minimal chronic ischemic white matter disease. (A prior MRI of the brain on 12/30/12 showed ventricular prominence that was deemed to be disproportionate to the degree of atrophy).Family reported that his behavior normalized though he continued to demonstrate memory problems. Dr. Posey Pronto noted that his Cloverleaf score improved from 17/30 on 06/17/14 to 24/30 on 09/30/14.  Chief Complaints & Current Status  Mr.  Moreno denied any current cognitive problems. He did report experiencing a decline in his memory after undergoing treatment for cancer of the tongue but stated that his cognition is back to normal. He cited his resumption of reading for pleasure within the past three weeks. His only current complaints were loss of the sense of taste and decreased appetite since undergoing radiation treatment for cancer of the tongue. He denied sad mood, undue anxiety, thoughts of suicide or homicide, hallucinations and delusional ideas.   He stated that he has always struggled with attention, memory and organization (corroborated by his business partner), which he attributed to having Attention Deficit Hyperactivity Disorder (ADHD). He reported that he had struggled to focus and sit still until he began using Adderall.   His wife stated that he displayed problems with memory prior to 2016 that became "much worse" by the end of his radiation treatment in May 2016. She gave examples of him not recalling recent conversations, forgetting things he said or did recently, asking the same question repeatedly, and not recalling where household items are stored. He has not displayed any confusion or unusual behavior since his delirium abated last summer. His food intake continues to be relatively low though his weight has stabilized. He has within the past week resumed having a mixed drink at night after not having consumed any alcohol since his cancer diagnosis. He has not displayed any major emotional or behavioral changes though has been prone to become angry (typically after his wife asks him whether he has eaten or exercised as planned) and has seemed to initiate less than prior to his illness. His business partner described him as quieter and less frequently  initiating social activities as compared to before his illness. He has not appeared sad or apathetic, nor has he expressed themes of depression.  He was described as  functioning independently within his home. He is not managing his personal finances though had not done so before his recent illness. Due to his changes post illness, he has had to relinquish the majority of duties necessary to run his company over to his partner.  Background Dylan Moreno lives with his wife of 9 years. They have one adult son.   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 He has done this for the past thirty-five years.  He reported that he was graduated from high school with history of attentional difficulties. He denied ever having been retained in grade. He attended one year of college but dropped out due to lack of interest.  He served in [employer] for six years in the 1960s.  In addition to cancer of the tongue, his past medical history was notable for contractures of hands, hyperlipidemia and a transient ischemic attack on 12/29/12. He reported no history of head injury, loss of consciousness, or seizure activity.  He reported that he was diagnosed with Attention Deficit Hyperactivity Disorder (ADHD) as an adult about fifteen years ago by a psychiatrist. In addition to attention and organizational difficulties, he reported that he could not sit still. His ADHD symptoms have been managed effectively by use of amphetamine-dextroamphetamine.  He has a near thirty year history of daily alcohol use, primarily mixed drinks and sometimes beer in the evenings. His wife stated that she has been concerned about his alcohol use for many years as he has typically become irritable and argumentative after drinking in contrast to his typical pleasant and congenial personality. He stopped smoking cigarettes in 2012 and then cigars in 2016. He reported remote use of marijuana but not within the past fifteen years at least. He denied use of other Illicit drugs.    He reported no history of serious emotional difficulties. His only  mental health contacts were to diagnose and treat ADHD.  His current medications include amphetamine-dextroamphetamine, atorvastatin, clopidogrel and levothyroxine.  He has no family history of stroke or dementia.  Observations He appeared as an appropriately dressed and groomed slender man of his stated age in no apparent distress. He related in a pleasant manner with a penchant to make jokes. He did not display any abnormalities of alertness or behavior. His speech was notable for intermittent stuttering, which all agreed he has done for most of his life. His affect appeared within a wide range and was mostly bright. He did not show signs of emotional distress. His thought processes were coherent and organized though he often failed to shift his thought processes when a new topic was introduced. His thought content was devoid of bizarre or unusual ideas.  Assessment Results Validity & Interpretative Considerations  Test results were deemed to represent a valid measure of his current cognitive functioning.  He did not report or display problems with vision (he wore his eyeglasses), hearing or motor control. His test taking behavior was marked by impulsive tendencies (e.g., prone to start on a test before the instructions had been fully read to him), an apparent careless approach at times and frustration in response to cognitive challenge (at one point he asked to stop testing prematurely). He had trouble recalling task directions, which might have been a function of his poor attention. Once started he seemed  to consistently persist to task. There were no obvious signs of inadequate effort.    He reported that he did not take Adderall on any days of testing.   His baseline intellectual potential was estimated to fall within the Average range based on demographic factors coupled with a measure of word reading (Wide Range Achievement Test-4) that represents an over-learned verbal skill relatively  resistant to the effects of neurological disorder or injury.  His test scores were corrected to reflect norms for his age and, whenever possible, his gender and educational level (i.e., 13 years). A listing of test results can be found at the end of this report.  Attention & Executive Functions His speed on tests of focused visual attention that required either transcription of symbols to match digits using a key (Wechsler Adult Intelligence Scale-IV (WAIS-IV) Coding) or drawing lines to connect numbers randomly arrayed on a page in numerical sequence (Trials A) fell within the Low Average range.    Mental tracking and set shifting were deficient based on his abnormally slow performance of a complex attentional task (Trails B) that required maintaining an ascending and alternating visual sequence of numbers and letters.   His capacity to hold and manipulate information in working memory was within normal expectations based on his ability to mentally rearranging digit sequences in reverse or in ascending numerical order (WAIS-IV Digit Span) or immediately recognize symbols in left to right order (WMS-IV Symbol Span).   Her ability to generate words to designated letters (Controlled Oral Word Association Test) fell at the lower boundary of the Average range. His ability to name members of a category (Animal Naming Test) was within the impaired range.  His performance on a test of verbal reasoning that required classification of ostensibly different objects or ideas to a shared category (WAIS-IV Similarities) fell within the Average range. His ability to apply nonverbal reasoning to identify the missing element to complete an abstract sequence or matrix of designs (WAIS-IV Matrix Reasoning) fell within the Low Average range.  He struggled on a test of nonverbal problem-solving and conceptual flexibility [employer]). He was observed to be able to infer logical ways to sort geometric  designs (i.e., gave several correct responses in a row) though repeatedly deviated from the correct strategy for no apparent reason (i.e., failure to maintain set). This test was prematurely ended after he resorted to a random responding.    Learning & Memory His WMS-IV Immediate Memory Index (IMI), a composite measure of his ability to recall verbal and visual information immediately after the stimuli was presented, fell within the Borderline range at the 5th percentile. There was no significant difference between his immediate auditory and visual recall. His Delayed Memory Index (DMI), a combined measure of his ability to recall verbal and visual information after a 20 to 30 minute delay, fell within the impaired range at the 1st percentile. His DMI was lower than expected given his IMI, which indicated abnormal forgetting over the course of the delay interval. This was evident on both auditory and visual delayed memory tasks. His delayed memory was somewhat better on a recognition format in which he was presented multiple choices or yes/no questions, which suggested that his memory was primarily deficient at the stage of retrieval.    Language His ability to name drawings of objects to confrontation [facility] [employer]) was within the Average range. As noted above, a measure of his letter fluency fell at the lower boundary of the Average range.  In contrast, his semantic fluency was seriously impaired as he could name only seven animals in one minute.  Visual-Spatial Organization & Visual-Construction  There were no signs of spatial inattention or problems with visual recognition. His ability to assemble two-dimensional block designs from models Product manager) was within the Low Average range. His copy of a spatially-complex geometric design (Rey Complex Figure) was recognizable though marred by minor distortions of some features and one omission of an internal detail. His drawing of a clock face  was within normal limits though some of the numbers were not drawn precisely. It is probable that his performances on copy tasks reflected the effects of his longtime hand contractures rather than an actual constructional deficit.  Emotional Status His score of 4/30 on the Geriatric Depression Scale was not suggestive of depression. Of the four items he endorsed in the depressive direction, three were related to perceived cognitive dysfunction   Summary & Conclusions Dylan Moreno is a 74 year-old man who demonstrated a decline in memory subsequent to having been treated for cancer of the tongue in the spring of 2016. He then experienced a period of delirium in May 2016 that was presumed secondary to pain medication. According to his wife, his cognitive functioning has since greatly improved though not back to his typical baseline. He was described as currently functioning independently within his home.  Neuropsychological testing indicated impairment of executive function manifested by problems with inhibitory control, attentional set shifting, set maintenance, mental flexibility and categorical fluency. His capacity for conceptual reasoning was intact, however. In addition, he demonstrated mild impairment of memory, primarily at the stages of acquisition and delayed retrieval.  Observations of this pleasant man were notable for tendencies to respond impulsively, to work in a careless manner at times and show signs of frustration to cognitive challenge. These observations would be consistent with his history of ADHD.   There were no indications of behavioral disturbance or a primary psychiatric disorder.  The main clinical questions are whether his current cognitive impairments are reversible and to what degree his neuropsychological profile reflected his history of ADHD. Adults with ADHD often demonstrate problems with executive function, particularly reduced attentional shifting, memory  acquisition, inhibitory control and mental flexibility. His memory impairment, however, exceeded what might be expected on the basis of ADHD.   He would likely function best in a familiar environment while following a structured routine as opposed to situations involving solving complex problems or demanding multitasking. He possesses sufficient mental capacity to make decisions in his own interests.  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 Resumption of psychostimulant medication, if deemed medically appropriate, would likely improve his cognitive functioning based on anecdotal report.  2. He would benefit from using memory compensatory strategies, such as use of a daily schedule, to-do list, portable recorder or a centrally located calendar/bulletin board.   3. Dylan Moreno would like to resume driving, mostly short distances. Given his issues with attention and executive function, it is recommended that he undergo a formal on-the-road driving evaluation with a professional driving evaluator to assess his driving competency.  4. He recently resumed alcohol use after having stopped drinking after his cancer diagnosis. His wife's report was suggestive of a prior pattern of alcohol abuse. He was advised to stop alcohol use to promote both general and brain health.  5. He was advised for the time being to continue to let his business partner to  handle the majority of duties necessary to run his company.  6. The current test results comprise a baseline of his cognitive functioning. A repeat neuropsychological evaluation could be considered in one year to eighteen months (or sooner if clinically indicated) to track interval changes, if any, in his cognitive or emotional functioning.   The results and recommendations from this evaluation was discussed with Dylan Moreno, his wife and business partner  on 12/03/14.   I have appreciated the opportunity to evaluate Dylan Moreno.  Please feel free to contact me with any comments or questions.     ______________________ Jamey Ripa, Ph.D Licensed Psychologist         ADDENDUM-NEUROPSYCHOLOGICAL TEST RESULTS  His test scores were corrected to reflect norms for his age and, whenever possible, his gender and educational level (i.e., 13 years).  Animal Naming Test Score= 7 <1st (adjusted for age, gender and educational level)   Financial trader Score=55/60 47th (adjusted for age, gender and educational level)   Controlled Oral Word Association Test Score=  31 words/1 repetition 25th  (adjusted for age, gender and educational level)   Rey Complex Figure: copy       Score= 27/36  Below average. Minor distortions of some features, one omission, sloppily drawn   Trails A Score= 49s 14th (adjusted for age, gender and educational level)  Trails B Score= 217s 2e    2nd (adjusted for age, gender and educational level)   Wechsler Adult Intelligence Scale-IV   Subtest Scaled Score Percentile  Block Design   7 16th      Similarities 10 50th   Digit Span  Forward               Backward               Sequencing 13 13 13   9  84th         84th    84th      37th       Matrix Reasoning   7 16th     Coding     7 16th       Wechsler Memory Scale-IV Older Adult Battery Index Index Score Percentile  Immediate Memory   75   5th      Auditory Memory   67   1st        Visual Memory   79   8th     Delayed Memory   63   1st       Symbol Span  Scaled score=8  25th        [employer] Test- discontinued  Wide Range Achievement Test-4 Subtest  Raw score Standard score Percentile  Word Reading 60/70  101 53nd

## 2014-12-13 ENCOUNTER — Telehealth: Payer: Self-pay | Admitting: Neurology

## 2014-12-13 NOTE — Telephone Encounter (Signed)
PT's [name] called and wanted to know if Dr Posey Pronto could recommend he go through [facility] CB# [phone]

## 2014-12-13 NOTE — Telephone Encounter (Signed)
Called patient's wife back and she said that Dr. Valentina Shaggy had recommended the driving rehab as well.  I will send referral to [facility] rehab per her request.

## 2014-12-21 ENCOUNTER — Ambulatory Visit (HOSPITAL_BASED_OUTPATIENT_CLINIC_OR_DEPARTMENT_OTHER): Payer: Medicare Other

## 2014-12-21 ENCOUNTER — Encounter (HOSPITAL_COMMUNITY): Payer: Self-pay

## 2014-12-21 ENCOUNTER — Ambulatory Visit (HOSPITAL_COMMUNITY)
Admission: RE | Admit: 2014-12-21 | Discharge: 2014-12-21 | Disposition: A | Discharge status: Home / Self Care | Payer: Medicare Other | Source: Ambulatory Visit | Attending: Hematology and Oncology | Admitting: Hematology and Oncology

## 2014-12-21 ENCOUNTER — Other Ambulatory Visit (HOSPITAL_BASED_OUTPATIENT_CLINIC_OR_DEPARTMENT_OTHER): Payer: Medicare Other

## 2014-12-21 ENCOUNTER — Other Ambulatory Visit: Payer: Self-pay

## 2014-12-21 VITALS — BP 123/78 | HR 78 | Temp 97.9°F | Resp 16

## 2014-12-21 DIAGNOSIS — C01 Malignant neoplasm of base of tongue: Secondary | ICD-10-CM | POA: Diagnosis not present

## 2014-12-21 DIAGNOSIS — Z95828 Presence of other vascular implants and grafts: Secondary | ICD-10-CM

## 2014-12-21 DIAGNOSIS — C029 Malignant neoplasm of tongue, unspecified: Secondary | ICD-10-CM | POA: Diagnosis not present

## 2014-12-21 DIAGNOSIS — Z452 Encounter for adjustment and management of vascular access device: Secondary | ICD-10-CM | POA: Diagnosis not present

## 2014-12-21 DIAGNOSIS — R590 Localized enlarged lymph nodes: Secondary | ICD-10-CM | POA: Insufficient documentation

## 2014-12-21 LAB — CBC WITH DIFFERENTIAL/PLATELET
BASO%: 0.5 % (ref 0.0–2.0)
Basophils Absolute: 0 10*3/uL (ref 0.0–0.1)
EOS ABS: 0 10*3/uL (ref 0.0–0.5)
EOS%: 1.1 % (ref 0.0–7.0)
HEMATOCRIT: 42.4 % (ref 38.4–49.9)
HEMOGLOBIN: 14 g/dL (ref 13.0–17.1)
LYMPH#: 0.3 10*3/uL — AB (ref 0.9–3.3)
LYMPH%: 6.9 % — ABNORMAL LOW (ref 14.0–49.0)
MCH: 29.3 pg (ref 27.2–33.4)
MCHC: 33 g/dL (ref 32.0–36.0)
MCV: 88.8 fL (ref 79.3–98.0)
MONO#: 0.5 10*3/uL (ref 0.1–0.9)
MONO%: 12.1 % (ref 0.0–14.0)
NEUT%: 79.4 % — ABNORMAL HIGH (ref 39.0–75.0)
NEUTROS ABS: 3.4 10*3/uL (ref 1.5–6.5)
PLATELETS: 223 10*3/uL (ref 140–400)
RBC: 4.78 10*6/uL (ref 4.20–5.82)
RDW: 14 % (ref 11.0–14.6)
WBC: 4.3 10*3/uL (ref 4.0–10.3)

## 2014-12-21 LAB — MAGNESIUM: Magnesium: 2.1 mg/dl (ref 1.5–2.5)

## 2014-12-21 MED ORDER — IOHEXOL 300 MG/ML  SOLN
75.0000 mL | Freq: Once | INTRAMUSCULAR | Status: AC | PRN
  Administered 2014-12-21: 75 mL via INTRAVENOUS

## 2014-12-21 MED ORDER — SODIUM CHLORIDE 0.9 % IJ SOLN
10.0000 mL | INTRAMUSCULAR | Status: DC | PRN
  Administered 2014-12-21: 10 mL via INTRAVENOUS
  Filled 2014-12-21: qty 10

## 2014-12-21 NOTE — Progress Notes (Signed)
Patient in for [facility] flush and labs. All labs obtained via PAC. Patient has appointment with [facility] for a CT scan today. Patient's PAC left accessed and covered with a Tegaderm dressing for appointment with [facility] per Patient's request. Instructed Patient to have PAC needle removed after CT scan. Patient verbalized understanding.

## 2014-12-21 NOTE — Patient Instructions (Signed)

## 2014-12-22 ENCOUNTER — Encounter: Payer: Self-pay | Admitting: Hematology and Oncology

## 2014-12-22 ENCOUNTER — Ambulatory Visit (HOSPITAL_BASED_OUTPATIENT_CLINIC_OR_DEPARTMENT_OTHER): Payer: Medicare Other | Admitting: Hematology and Oncology

## 2014-12-22 VITALS — BP 115/65 | HR 83 | Temp 97.4°F | Resp 18 | Ht 73.0 in | Wt 165.8 lb

## 2014-12-22 DIAGNOSIS — I890 Lymphedema, not elsewhere classified: Secondary | ICD-10-CM

## 2014-12-22 DIAGNOSIS — R682 Dry mouth, unspecified: Secondary | ICD-10-CM

## 2014-12-22 DIAGNOSIS — C01 Malignant neoplasm of base of tongue: Secondary | ICD-10-CM

## 2014-12-22 DIAGNOSIS — G3184 Mild cognitive impairment, so stated: Secondary | ICD-10-CM

## 2014-12-22 DIAGNOSIS — F1010 Alcohol abuse, uncomplicated: Secondary | ICD-10-CM

## 2014-12-22 HISTORY — DX: Mild cognitive impairment of uncertain or unknown etiology: G31.84

## 2014-12-22 NOTE — Assessment & Plan Note (Signed)
According to family members, and the patient later admits that he has resumed alcohol intake, of "small amounts". He is not able to give an honest answer about his consumption but repeatedly say it is not as much as what it used to be. I spent a lot of time educating the patient and family members that alcohol intake should be avoided at all costs if possible in the future as this is one of the risk factors that may cause cancer reccurrence

## 2014-12-22 NOTE — Assessment & Plan Note (Signed)
Imaging study show no evidence of disease. Examination is satisfactory. I recommend close follow-up with ENT and radiation oncology services. There is no further benefit for routine imaging study in the future. My next goals would be to try to get rid of the feeding tube and the port. Family members are concerned about his oral fluid intake and would like to keep the port for at least another 2 weeks. Over the past 3 months, is able to tolerate nearly 100% of intake by mouth with the exception of water intake which he is struggling with, predominantly because of the lack of taste. We discussed the importance of keeping adequate oral fluid intake. I plan to call the patient in 2 weeks. If he is able to take in oral fluid intake by mouth satisfactorily, we will get the port and feeding tube removed. I will transition his care to cancer survivorship clinic.

## 2014-12-22 NOTE — Assessment & Plan Note (Signed)
This is related to recent radiation treatment. I will recommend him to resume lymphedema exercises.

## 2014-12-22 NOTE — Assessment & Plan Note (Signed)
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 I recommended increase sips of water by mouth and I recommend regular dentist visit due to high risk of dental caries.

## 2014-12-22 NOTE — Progress Notes (Signed)
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    04/07/2014 - 05/05/2014 Chemotherapy He received 2 doses of high dose cisplatin. He could not received a third dose due to profound side effects.   04/28/2014 Adverse Reaction Cycle 2 chemotherapy is delayed due to neutropenia   05/25/2014 - 06/03/2014 Hospital Admission He was admitted to the hospital for management of acute delirium.   07/19/2014 Imaging PET/CT, restaging:  Resolution of tongue base lesion; significant decrease inbilateral cervical lymphadenopathy.  No definite evidence of metastatic disease.   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   12/21/2014 Imaging Repeat Ct scan showed no evidence of cancer    INTERVAL HISTORY: Please see below for problem  oriented charting. He returns for further follow-up. He is needing 100% of fluid by mouth but is using the feeding tube for water flushes. He denies recent choking. He has chronic dry mouth. 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 He has resumed drinking alcohol on a regular basis. He is undergoing neuropsychiatric evaluation at the rehabilitation center. There were no further delirium episodes.  REVIEW OF SYSTEMS:   Constitutional: Denies fevers, chills or abnormal weight loss Eyes: Denies blurriness of vision Ears, nose, mouth, throat, and face: Denies mucositis or sore throat Respiratory: Denies cough, dyspnea or wheezes Cardiovascular: Denies palpitation, chest discomfort or lower extremity swelling Gastrointestinal:  Denies nausea, heartburn or change in bowel habits Skin: Denies abnormal skin rashes Lymphatics: Denies new lymphadenopathy or easy bruising Neurological:Denies numbness, tingling or new weaknesses Behavioral/Psych: Mood is stable, no new changes  All other systems were reviewed with the patient and are negative.  I have reviewed the past medical history, past surgical history, social history and family history with the patient and they are unchanged from previous note.  ALLERGIES:  has No Known Allergies.  MEDICATIONS:  Current Outpatient Prescriptions  Medication Sig Dispense Refill  . atorvastatin (LIPITOR) 20 MG tablet Place 1 tablet (20 mg total) into feeding tube daily at 6 PM. (Patient taking differently: Take 20 mg by mouth daily at 6 PM. ) 30 tablet 0  . clopidogrel (PLAVIX) 75 MG tablet Place 1 tablet (75 mg total) into feeding tube daily. (Patient taking differently: Take 75 mg by mouth daily. ) 30 tablet 0  . levothyroxine (SYNTHROID, LEVOTHROID) 75 MCG tablet Place 1 tablet (  75 mcg total) into feeding tube daily before breakfast. (Patient taking differently: Take 75 mcg by mouth daily before breakfast. ) 30 tablet 0  . sodium fluoride  (PREVIDENT 5000 PLUS) 1.1 % CREA dental cream Apply to tooth brush. Brush teeth for 2 minutes. Spit out excess-DO NOT swallow. Repeat nightly. 1 Tube prn  . thiamine 100 MG tablet Take 100 mg by mouth daily.     Marland Kitchen amphetamine-dextroamphetamine (ADDERALL) 20 MG tablet Take 20 mg by mouth daily. Reported on 12/22/2014     No current facility-administered medications for this visit.    PHYSICAL EXAMINATION: ECOG PERFORMANCE STATUS: 1 - Symptomatic but completely ambulatory  Filed Vitals:   12/22/14 1111  BP: 115/65  Pulse: 83  Temp: 97.4 F (36.3 C)  Resp: 18   Filed Weights   12/22/14 1111  Weight: 165 lb 12.8 oz (75.206 kg)    GENERAL:alert, no distress and comfortable SKIN: skin color, texture, turgor are normal, no rashes or significant lesions EYES: normal, Conjunctiva are pink and non-injected, sclera clear OROPHARYNX:no exudate, no erythema and lips, buccal mucosa, and tongue normal  NECK: Noted lymphedema around his neck. LYMPH:  no palpable lymphadenopathy in the cervical, axillary or inguinal LUNGS: clear to auscultation and percussion with normal breathing effort HEART: regular rate & rhythm and no murmurs and no lower extremity edema ABDOMEN:abdomen soft, non-tender and normal bowel sounds. Feeding tube site looks okay Musculoskeletal:no cyanosis of digits and no clubbing  NEURO: alert & oriented x 3 with fluent speech, no focal motor/sensory deficits  LABORATORY DATA:  I have reviewed the data as listed    Component Value Date/Time   NA 138 11/02/2014 1013   NA 132* 06/04/2014 1352   K 4.5 11/02/2014 1013   K 4.6 06/04/2014 1352   CL 96* 06/04/2014 1352   CO2 25 11/02/2014 1013   CO2 26 06/04/2014 1352   GLUCOSE 131 11/02/2014 1013   GLUCOSE 111* 06/04/2014 1352   BUN 23.9 11/02/2014 1013   BUN 33* 06/04/2014 1352   CREATININE 1.1 11/02/2014 1013   CREATININE 1.01 06/04/2014 1352   CALCIUM 10.0 11/02/2014 1013   CALCIUM 9.5 06/04/2014 1352   PROT 6.9  11/02/2014 1013   PROT 7.0 06/04/2014 1352   ALBUMIN 3.9 11/02/2014 1013   ALBUMIN 3.0* 06/04/2014 1352   AST 22 11/02/2014 1013   AST 61* 06/04/2014 1352   ALT 23 11/02/2014 1013   ALT 93* 06/04/2014 1352   ALKPHOS 97 11/02/2014 1013   ALKPHOS 109 06/04/2014 1352   BILITOT 0.87 11/02/2014 1013   BILITOT 0.4 06/04/2014 1352   GFRNONAA >60 06/04/2014 1352   GFRAA >60 06/04/2014 1352    No results found for: SPEP, UPEP  Lab Results  Component Value Date   WBC 4.3 12/21/2014   NEUTROABS 3.4 12/21/2014   HGB 14.0 12/21/2014   HCT 42.4 12/21/2014   MCV 88.8 12/21/2014   PLT 223 12/21/2014      Chemistry      Component Value Date/Time   NA 138 11/02/2014 1013   NA 132* 06/04/2014 1352   K 4.5 11/02/2014 1013   K 4.6 06/04/2014 1352   CL 96* 06/04/2014 1352   CO2 25 11/02/2014 1013   CO2 26 06/04/2014 1352   BUN 23.9 11/02/2014 1013   BUN 33* 06/04/2014 1352   CREATININE 1.1 11/02/2014 1013   CREATININE 1.01 06/04/2014 1352      Component Value Date/Time   CALCIUM 10.0 11/02/2014 1013   CALCIUM 9.5  06/04/2014 1352   ALKPHOS 97 11/02/2014 1013   ALKPHOS 109 06/04/2014 1352   AST 22 11/02/2014 1013   AST 61* 06/04/2014 1352   ALT 23 11/02/2014 1013   ALT 93* 06/04/2014 1352   BILITOT 0.87 11/02/2014 1013   BILITOT 0.4 06/04/2014 1352       RADIOGRAPHIC STUDIES: I have personally reviewed the radiological images as listed and agreed with the findings in the report. Ct Soft Tissue Neck W Contrast  12/21/2014  CLINICAL DATA:  Restaging of carcinoma of the tongue. Chemotherapy and radiation complete. EXAM: CT NECK WITH CONTRAST TECHNIQUE: Multidetector CT imaging of the neck was performed using the standard protocol following the bolus administration of intravenous contrast. CONTRAST:  13mL OMNIPAQUE IOHEXOL 300 MG/ML  SOLN COMPARISON:  09/21/2014 and multiple previous FINDINGS: Pharynx and larynx: No mucosal or submucosal lesion. No evidence of residual gross tumor  at the central tongue base location. Salivary glands: Parotid and submandibular glands are normal. Thyroid: Normal Lymph nodes: Continued reduction in size of lymph nodes in the neck. No enlarged lymph nodes on the right. Small calcified level 2 node is unchanged. On the left, the largest level 2 node shows reduction of short axis diameter from 13 mm to 9 mm. Smaller level 3 nodes have reduced from 9 mm to 6 mm, 6 mm to 4.5 mm, and 6 mm to 4 mm. There are no enlarging nodes. Vascular: Atherosclerotic change at both carotid bifurcations but no flow-limiting stenosis seen. Diminutive jugular vein on the left shows a tiny patent lumen. Limited intracranial: Brain atrophy. Chronic ventriculomegaly. No acute or focal finding. Visualized orbits: Normal Mastoids and visualized paranasal sinuses: Clear Skeleton: Ordinary spondylosis Upper chest: Scarring and emphysema at the lung apices. No evidence of metastatic disease. Superior mediastinum is negative. IMPRESSION: Continued favorable evolution. Lymph nodes in the left neck continue to get smaller. The largest level 2 node shows reduction of short axis diameter from 13 mm to 9 mm. No new or enlarging nodes. No evidence of residual central tongue base mass. Electronically Signed   By: Nelson Chimes M.D.   On: 12/21/2014 11:09     ASSESSMENT & PLAN:  Malignant neoplasm of base of tongue Imaging study show no evidence of disease. Examination is satisfactory. I recommend close follow-up with ENT and radiation oncology services. There is no further benefit for routine imaging study in the future. My next goals would be to try to get rid of the feeding tube and the port. Family members are concerned about his oral fluid intake and would like to keep the port for at least another 2 weeks. Over the past 3 months, is able to tolerate nearly 100% of intake by mouth with the exception of water intake which he is struggling with, predominantly because of the lack of  taste. We discussed the importance of keeping adequate oral fluid intake. I plan to call the patient in 2 weeks. If he is able to take in oral fluid intake by mouth satisfactorily, we will get the port and feeding tube removed. I will transition his care to cancer survivorship clinic.  Alcohol abuse According to family members, and the patient later admits that he has resumed alcohol intake, of "small amounts". He is not able to give an honest answer about his consumption but repeatedly say it is not as much as what it used to be. 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 I will recommend him to resume lymphedema exercises.    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 I recommended increase sips of water by mouth and I recommend regular dentist visit due to high risk of dental caries.  Mild cognitive impairment with memory loss According to multiple family members, his cognitive impairment has improved back to near baseline. He will continue to see neuropsychologist at neuro rehabilitation.   Orders Placed This Encounter  Procedures  . Amb Referral to Survivorship Long term    Referral Priority:  Routine    Referral Type:  Consultation    Number of Visits Requested:  1   All questions were answered. The patient knows to call the clinic with any problems, questions or concerns. No barriers to learning was detected. I spent 20 minutes counseling the patient face to face. The total time spent in the appointment was 25 minutes and more than 50% was on counseling and review of test results     [facility], [place], MD 12/22/2014 11:59 AM

## 2014-12-22 NOTE — Assessment & Plan Note (Signed)
According to multiple family members, his cognitive impairment has improved back to near baseline. He will continue to see neuropsychologist at neuro rehabilitation.

## 2014-12-24 ENCOUNTER — Telehealth: Payer: Self-pay | Admitting: Hematology and Oncology

## 2014-12-24 DIAGNOSIS — F909 Attention-deficit hyperactivity disorder, unspecified type: Secondary | ICD-10-CM | POA: Diagnosis not present

## 2014-12-24 NOTE — Telephone Encounter (Signed)
Spoke with patients wife and she is aware of the survivorship appointments

## 2015-01-05 ENCOUNTER — Telehealth: Payer: Self-pay | Admitting: *Deleted

## 2015-01-05 ENCOUNTER — Telehealth: Payer: Self-pay | Admitting: Hematology and Oncology

## 2015-01-05 NOTE — Telephone Encounter (Signed)
I spoke with the patient over the phone. He does not how much he weighs but he felt that he was able to get adequate oral intake because he is eating well and drinking adequate amounts of liquids. I will get my Navigator to contact him in 2 days to get definitive answer regarding his current weight. If he weighs over 165 pounds, we will get the feeding tube removed. He agrees with the plan of care.

## 2015-01-05 NOTE — Telephone Encounter (Signed)
  Oncology Nurse Navigator Documentation Navigator Location: [facility] (01/05/15 1433) Navigator Encounter Type: Telephone (01/05/15 1433) Telephone: Outgoing Call;Clinic/[facility] Follow-up (01/05/15 1433)         Patient Visit Type: Follow-up (01/05/15 1433) Treatment Phase: Post-Tx Follow-up (01/05/15 1433) Barriers/Navigation Needs: No barriers at this time (01/05/15 1433)     Called patient to check on his oral intake and weight maintenance, determine if appropriate to have PEG removed.  He was at the gym, weighed himself as we spoke, reported 164.5 lbs.  His last recorded weight on 12/22/14 was 165 lbs 12.8 ounces.  He stated he is eating/drinking "everything and anything" by mouth including 5-6 bottles of Boost daily with the exception of about 20 ounces of water via PEG in the morning.   He admits that his appetite has not returned to baseline. I asked if it was OK to call his wife to get additional input, he agreed noting best to call her tomorrow morning.  Gayleen Orem, RN, BSN, [place] at [place] [phone]                           Time Spent with Patient: 15 (01/05/15 1433)

## 2015-01-10 ENCOUNTER — Telehealth: Payer: Self-pay | Admitting: *Deleted

## 2015-01-10 NOTE — Telephone Encounter (Signed)
  Oncology Nurse Navigator Documentation Navigator Location: [facility] (01/10/15 1301) Navigator Encounter Type: Telephone (01/10/15 1301) Telephone: Outgoing Call (01/10/15 1301)         Patient Visit Type: Follow-up (01/10/15 1301) Treatment Phase: Post-Tx Follow-up (01/10/15 1301)                  Acuity: Level 1 (01/10/15 1301)     Spoke with patient, asked him to weigh himself the next time he is at the gym and call me with his weight.  He understands that the value is needed to determine if it is appropriate to have his PEG removed.  He agreed to request.  Gayleen Orem, RN, BSN, [place] at [place] [phone]         Time Spent with Patient: 15 (01/10/15 1301)

## 2015-01-10 NOTE — Telephone Encounter (Addendum)
  Oncology Nurse Navigator Documentation Navigator Location: [facility] (01/10/15 [id number]) Navigator Encounter Type: Telephone (01/10/15 0931) Telephone: Outgoing Call (01/10/15 [id number])         Patient Visit Type: Follow-up (01/10/15 0931) Treatment Phase: Post-Tx Follow-up (01/10/15 0931)     Called Dylan Moreno's wife Dylan Moreno again for confirmation of his self-report oral intake and weight maintenance.   She reported he is "eating better and more, drinking less supplement, using PEG for water only".  She thinks his weight is around 159 lbs, would prefer he be weighed at [facility] to confirm.  I spoke with Dory Peru, RD, who last saw patient 11/29/14.  She indicated she does not need to see him at this time for nutritional follow-up. Dr. Alvy Bimler updated.  Gayleen Orem, RN, BSN, [place] at [place] [phone]                           Time Spent with Patient: 15 (01/10/15 0931)

## 2015-01-12 ENCOUNTER — Telehealth: Payer: Self-pay | Admitting: *Deleted

## 2015-01-12 NOTE — Telephone Encounter (Signed)
  Oncology Nurse Navigator Documentation Navigator Location: [facility] (01/12/15 1436) Navigator Encounter Type: Telephone (01/12/15 1436) Telephone: Incoming Call (01/12/15 1436)         Patient Visit Type: Follow-up (01/12/15 1436) Treatment Phase: Post-Tx Follow-up (01/12/15 1436)                  Acuity: Level 1 (01/12/15 1436)     Patient called to report weight of 162 lbs taken today at gym.   He reported weight of 164.5 lbs on 01/05/15 on same scale.   I encouraged him to continue oral intake of high protein/calorie foods. He stated he will check weight again next week and call with value. Information routed to Dr. Alvy Bimler.   Gayleen Orem, RN, BSN, [place] at [place] [phone]        Time Spent with Patient: 15 (01/12/15 1436)

## 2015-01-21 ENCOUNTER — Other Ambulatory Visit: Payer: Self-pay | Admitting: Hematology and Oncology

## 2015-01-21 ENCOUNTER — Telehealth: Payer: Self-pay | Admitting: *Deleted

## 2015-01-21 NOTE — Telephone Encounter (Signed)
  Oncology Nurse Navigator Documentation Navigator Location: [facility] (01/21/15 1208) Navigator Encounter Type: Telephone (01/21/15 1208) Telephone: [place] Crocker Call (01/21/15 1208)         Patient Visit Type: Follow-up (01/21/15 1208) Treatment Phase: Post-Tx Follow-up (01/21/15 1208)                  Acuity: Level 1 (01/21/15 1208)     Received call from Mr Ambush.  He reported a weight this week of 164 lbs.  He stated he continues to use PEG only for PRN hydration.  He is ready to have PEG removed.  I indicated I would notify Dr. Alvy Bimler, that he can expect a call from [facility] to arrange an appt.  Gayleen Orem, RN, BSN, [place] at [place] [phone]        Time Spent with Patient: 15 (01/21/15 1208)

## 2015-01-23 ENCOUNTER — Other Ambulatory Visit: Payer: Self-pay | Admitting: Hematology and Oncology

## 2015-01-23 DIAGNOSIS — C01 Malignant neoplasm of base of tongue: Secondary | ICD-10-CM

## 2015-01-23 NOTE — Telephone Encounter (Signed)
I placed order to remove both port and feeding tube

## 2015-01-24 ENCOUNTER — Encounter: Payer: Self-pay | Admitting: *Deleted

## 2015-01-24 NOTE — Progress Notes (Signed)
OK to hold plavix X 5 days for port removal/g-tube removal per Dr Alvy Bimler

## 2015-01-27 ENCOUNTER — Other Ambulatory Visit: Payer: Self-pay | Admitting: Hematology and Oncology

## 2015-01-31 ENCOUNTER — Encounter: Payer: Self-pay | Admitting: Neurology

## 2015-01-31 ENCOUNTER — Ambulatory Visit (INDEPENDENT_AMBULATORY_CARE_PROVIDER_SITE_OTHER): Payer: Medicare Other | Admitting: Neurology

## 2015-01-31 VITALS — BP 120/70 | HR 77 | Wt 162.4 lb

## 2015-01-31 DIAGNOSIS — G3184 Mild cognitive impairment, so stated: Secondary | ICD-10-CM | POA: Diagnosis not present

## 2015-01-31 DIAGNOSIS — G621 Alcoholic polyneuropathy: Secondary | ICD-10-CM | POA: Diagnosis not present

## 2015-01-31 NOTE — Progress Notes (Signed)
Follow-up Visit   Date: 01/31/2015    MOTAZ ALBERS MRN: [id number] DOB: Jun 06, 1940   Interim History: Dylan Moreno is a 75 y.o. right-handed Caucasian male with history of hyperlipidemia, tobacco use, ADD, TIA (12/29/2012), squamous cell carcinoma s/p chemo/radiation (Spring 2016) returning to the clinic for complaints of memory impairment.  The patient was accompanied to the clinic by wife and son.  History of present illness: He presented to the Emergency Department on 12/29/2012 with acute onset of left facial droop, lasting one hour. He woke up that morning feeling dizzy and lightheaded. His son, who is a Agricultural consultant, noticed his facial droop and brought him to the ED for evaluation. He CT of the brain, MRI/A of the brain which did not show any acute stroke. There was a note of ventriculomegaly suggestive of NPH. US carotids showed patent ICAs and echocardiogram did not reveal an embolic source. He was previously taking lipitor 10mg  and was taking aspirin 81mg  daily. He was discharged on aspirin 325mg .  No prior personal or family history of stroke or TIA.   - Follow-up 05/04/2013:  At his last visit, I switched him to plavix.  48-hr holter testing did not show any arrhythmias.   He is tolerating it well and denies any new neurological symptoms. He is trying to cut back on smoking cigars.   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 He also underwent PEG tube placement. He was admitted to [facility] 5/45 - 06/03/2014 because of agitation, hallucinations, and delirium which had been ongoing for a week prior to presentation and thought to be due to combination of pain medications.  He also stopped taking Adderal one week prior to symptom onset  While hospitalized, his pain medications were slowly reduced and he started  becoming less agitated.  He was discharged to rehab facility on 6/2.  The following day, patient's family noted that his right pupil was larger so was taken to the emergency department where neurology evaluated him. Exam was notable for right pupil 6 mm and left pupil 3 mm, round, reactive to light and accommodation--no APD.  CTA did not demonstrate any vascular abnormalities.  Pupil asymmetry lasted about 2 days.  Over the past week, family has noticed a huge improvement and say that he is almost back to his baseline but continues to have memory problems.  He cannot keep up with reading as well and reports to reading 5 hours per day.  His son feels that he does not remember to focus on tasks such as what is taught in PT (how to stand/sit, etc).    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   UPDATE 09/30/2014:  He feels that he is doing well, but his wife and son feel that his memory is worse and balance is a problem.  He is doing out-patient therapy and is using a cane.  He has fallen 2-3 times, with superficial injuries.  He is not complaint with his home exercises and family feels this is partly the issue for his lack of improvement.  He does not remember appointments or tasks.  Patient is argumentative and disagrees with family's concerns.   UPDATE 01/31/2015:  He denies any new cognitive changes, their neuropsychological testing with Dr.  Zelson showed neurocognitive disorder, unspecified, and ADHD.  He is scheduled to have a driving assessment tomorrow but is argumentative as to the reason why this needs to be done.  He also complains of spells of right leg trembling, especially after working out with his trainer.  Family denies any new cognitive issues.   He is having his PEG removed on Wednesday.   Medications:  Current Outpatient Prescriptions on File Prior to Visit  Medication Sig Dispense Refill  . amphetamine-dextroamphetamine (ADDERALL) 20 MG  tablet Take 20 mg by mouth daily. Reported on 12/22/2014    . atorvastatin (LIPITOR) 20 MG tablet Place 1 tablet (20 mg total) into feeding tube daily at 6 PM. (Patient taking differently: Take 20 mg by mouth daily at 6 PM. ) 30 tablet 0  . clopidogrel (PLAVIX) 75 MG tablet Place 1 tablet (75 mg total) into feeding tube daily. (Patient taking differently: Take 75 mg by mouth daily. ) 30 tablet 0  . levothyroxine (SYNTHROID, LEVOTHROID) 75 MCG tablet Place 1 tablet (75 mcg total) into feeding tube daily before breakfast. (Patient taking differently: Take 75 mcg by mouth daily before breakfast. ) 30 tablet 0  . sodium fluoride (PREVIDENT 5000 PLUS) 1.1 % CREA dental cream Apply to tooth brush. Brush teeth for 2 minutes. Spit out excess-DO NOT swallow. Repeat nightly. 1 Tube prn  . thiamine 100 MG tablet Take 100 mg by mouth daily.      No current facility-administered medications on file prior to visit.    Allergies: No Known Allergies   Review of Systems:  CONSTITUTIONAL: No fevers, chills, night sweats, or weight loss.   EYES: No visual changes or eye pain ENT: No hearing changes.  No history of nose bleeds.   RESPIRATORY: No cough, wheezing and shortness of breath.   CARDIOVASCULAR: Negative for chest pain, and palpitations.   GI: Negative for abdominal discomfort, blood in stools or black stools.  No recent change in bowel habits.   GU:  No history of incontinence.   MUSCLOSKELETAL: No history of joint pain or swelling.  No myalgias.   SKIN: Negative for lesions, rash, and itching.   ENDOCRINE: Negative for cold or heat intolerance, polydipsia or goiter.   PSYCH:  No depression or anxiety symptoms.   NEURO: As Above.   Vital Signs:  BP 120/70 mmHg  Pulse 77  Wt 162 lb 7 oz (73.681 kg)  SpO2 94%  Neurological Exam: MENTAL STATUS:  Alert, awake, and easily engages in conversation.  Good eye contact.  Well groomed and dressed. Oriented x5. Speech is not dysarthric, but there is  intermittent stuttering of speech (old).  Montreal Cognitive Assessment  09/30/2014 06/17/2014  Visuospatial/ Executive (0/5) 5 2  Naming (0/3) 3 2  Attention: Read list of digits (0/2) 2 2  Attention: Read list of letters (0/1) 1 1  Attention: Serial 7 subtraction starting at 100 (0/3) 3 1  Language: Repeat phrase (0/2) 2 2  Language : Fluency (0/1) 1 0  Abstraction (0/2) 1 1  Delayed Recall (0/5) 1 0  Orientation (0/6) 5 6  Total 24 17  Adjusted Score (based on education) 24 17    CRANIAL NERVES: Pupils round and reactive to light.  Normal conjugate, extra-ocular eye movements in all directions of gaze.  No ptosis. Normal facial sensation.  Face is symmetric. Palate elevates symmetrically.  Tongue is midline.  MOTOR:  Motor strength is 5/5 in all extremities including hip flexion, except mild intrinsic hand weakness bilaterally.  MSRs:  Reflexes are 1+/4 in the upper extremities, absent reflexes in the lower extremities.   SENSORY:  Reduced vibration at the ankles.  Rhomberg testing shows mild sway  COORDINATION/GAIT:  Gait slightly appears appears normal, mild unsteadiness with turning.   Data: US carotids 12/30/2012: Findings suggest 1-39% internal carotid artery stenosis bilaterally. Vertebral arteries are patent with antegrade flow.   MRI/A brain 12/29/2012:  1. Ventricular prominence is disproportionate to the degree of atrophy, suggesting normal pressure hydrocephalus.  2. No other acute intracranial abnormality. Specifically, there is no evidence for acute or subacute infarct.  3. Mild distal small vessel disease is evident on the MRA without significant proximal stenosis, aneurysm, or branch vessel occlusion.   48-hr holter:  NSR, rare PVCs  CT head 06/04/2014: Ventricular prominence similar to prior MR suggesting normal pressure hydrocephalus. No intracranial hemorrhage. No CT evidence of large acute infarct. No intracranial mass or abnormal enhancement.  CTA head  and neck 06/04/2014:   Calcified plaque with mild to slightly moderate narrowing cavernous segment internal carotid artery bilaterally. Mild narrowing and irregularity M1 segment left middle cerebral artery. Mild narrowing distal left vertebral artery.  MRI lumbar spine wo contrast 09/30/2014: 1. Minimal left foraminal narrowing at L4-5 and L5-S1 secondary to leftward disc protrusions and facet hypertrophy. 2. Leftward disc protrusion at L3-4 without significant stenosis.  Neuropsychological testing 12/03/2014:  Unspecified mild neurocognitive disorder, Attention Deficit Hyperactivity disorder by history   IMPRESSION: 1.  Cognitive impairment, stable  - Neuropsychological testing performed by Dr. Valentina Shaggy shows mild neurocognitive disorder, ADHD  - Formal driving evaluation scheduled for tomorrow, patient argumentative and does not want to go but agreed after much discussion  - CT head showing ventricular prominence, cannot exclude the likelihood of NPH, but gait appears improved so will continue to follow  2.  Right leg weakness, improved.  Right leg trembling sensation is most likely exercise-induced.  Encouraged to stay well hydrated and work within his limitations  3. Alcoholic peripheral neuropathy causing sensory ataxia   4. 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  5.  ADHD follow up with PCP  Return to clinic in 4 months  The duration of this appointment visit was 30 minutes of face-to-face time with the patient.  Greater than 50% of this time was spent in counseling, explanation of diagnosis, planning of further management, and coordination of care.   Thank you for allowing me to participate in patient's care.  If I can answer any additional questions, I would be pleased to do so.    Sincerely,    Donika K. Posey Pronto, DO

## 2015-01-31 NOTE — Therapy (Signed)
[facility] [address] [place], [place], [address] Phone: [phone]   Fax:  [phone]  Patient Details  Name: Dylan Moreno MRN: [id number] Date of Birth: 12-01-1940 Referring Provider:  No ref. provider found  Encounter Date: 01/31/2015 SPEECH THERAPY DISCHARGE SUMMARY  Visits from Start of Care: 5  Current functional level related to goals / functional outcomes: Pt's last visit to see SLP was August 2015, in which pt was not completing HEP to SLP prescribed frequency. Goal update from that visit is below this section. Pt?family have been called and have not returned calls to schedule follow up ST. It is assumed at this time that pt no longer needs/desires ST.  SLP SHORT TERM GOAL #1      Title  pt will complete HEP with occasional min A     Time  1     Period  --  visit     Status  Partially Met     SLP SHORT TERM GOAL #2     Title  pt will tell SLP why he is completing HEP with rare min A     Time  1     Period  --  visit     Status  Not Met                SLP Long Term Goals - 08/16/14 1406     SLP LONG TERM GOAL #1     Title  pt will complete HEP with occasional min A     Baseline  renewed 07-14-14     Time  2     Period  --  visits     Status  On-going     SLP LONG TERM GOAL #2     Title  pt will tell SLP 3 signs/symptoms aspiration PNA     Baseline  renewed 07-14-14     Time  2     Period  --  visits     Status  On-going     SLP LONG TERM GOAL #3     Title  pt will tell SLP how a food journal can be helpful in return to full PO diet with rare min A     Baseline  renewed 07-14-14     Time  2     Period  --  visits     Status  On-going       Remaining deficits: Unknown. At last visit, pt was not completing PO diet with regularity, but was maintaining nutrition with liquid supplements.   Education / Equipment: HEP, late effects head/neck radiation  Plan: Patient agrees to discharge.   Patient goals were not met. Patient is being discharged due to not returning since the last visit.  ?????       [place] , MS, CCC-SLP   01/31/2015, 4:55 PM  [facility] [address] [place], [place], [address] Phone: [phone]   Fax:  [phone]

## 2015-01-31 NOTE — Patient Instructions (Signed)
Return to clinic in 6 months.

## 2015-02-01 ENCOUNTER — Other Ambulatory Visit: Payer: Self-pay | Admitting: Radiology

## 2015-02-02 ENCOUNTER — Ambulatory Visit (HOSPITAL_COMMUNITY)
Admission: RE | Admit: 2015-02-02 | Discharge: 2015-02-02 | Disposition: A | Discharge status: Home / Self Care | Payer: Medicare Other | Source: Ambulatory Visit | Attending: Hematology and Oncology | Admitting: Hematology and Oncology

## 2015-02-02 ENCOUNTER — Encounter (HOSPITAL_COMMUNITY): Payer: Self-pay

## 2015-02-02 DIAGNOSIS — Z452 Encounter for adjustment and management of vascular access device: Secondary | ICD-10-CM | POA: Diagnosis not present

## 2015-02-02 DIAGNOSIS — Z8249 Family history of ischemic heart disease and other diseases of the circulatory system: Secondary | ICD-10-CM | POA: Insufficient documentation

## 2015-02-02 DIAGNOSIS — Z7902 Long term (current) use of antithrombotics/antiplatelets: Secondary | ICD-10-CM | POA: Diagnosis not present

## 2015-02-02 DIAGNOSIS — I10 Essential (primary) hypertension: Secondary | ICD-10-CM | POA: Insufficient documentation

## 2015-02-02 DIAGNOSIS — Z431 Encounter for attention to gastrostomy: Secondary | ICD-10-CM | POA: Insufficient documentation

## 2015-02-02 DIAGNOSIS — Z87442 Personal history of urinary calculi: Secondary | ICD-10-CM | POA: Diagnosis not present

## 2015-02-02 DIAGNOSIS — K219 Gastro-esophageal reflux disease without esophagitis: Secondary | ICD-10-CM | POA: Diagnosis not present

## 2015-02-02 DIAGNOSIS — C01 Malignant neoplasm of base of tongue: Secondary | ICD-10-CM

## 2015-02-02 DIAGNOSIS — Z8673 Personal history of transient ischemic attack (TIA), and cerebral infarction without residual deficits: Secondary | ICD-10-CM | POA: Insufficient documentation

## 2015-02-02 DIAGNOSIS — Z85810 Personal history of malignant neoplasm of tongue: Secondary | ICD-10-CM | POA: Diagnosis not present

## 2015-02-02 DIAGNOSIS — E7800 Pure hypercholesterolemia, unspecified: Secondary | ICD-10-CM | POA: Insufficient documentation

## 2015-02-02 DIAGNOSIS — M1990 Unspecified osteoarthritis, unspecified site: Secondary | ICD-10-CM | POA: Diagnosis not present

## 2015-02-02 DIAGNOSIS — F909 Attention-deficit hyperactivity disorder, unspecified type: Secondary | ICD-10-CM | POA: Diagnosis not present

## 2015-02-02 DIAGNOSIS — Z87891 Personal history of nicotine dependence: Secondary | ICD-10-CM | POA: Diagnosis not present

## 2015-02-02 DIAGNOSIS — F419 Anxiety disorder, unspecified: Secondary | ICD-10-CM | POA: Diagnosis not present

## 2015-02-02 LAB — CBC WITH DIFFERENTIAL/PLATELET
Basophils Absolute: 0 10*3/uL (ref 0.0–0.1)
Basophils Relative: 0 %
EOS PCT: 1 %
Eosinophils Absolute: 0 10*3/uL (ref 0.0–0.7)
HCT: 44.5 % (ref 39.0–52.0)
Hemoglobin: 15.2 g/dL (ref 13.0–17.0)
LYMPHS ABS: 0.5 10*3/uL — AB (ref 0.7–4.0)
LYMPHS PCT: 9 %
MCH: 30.8 pg (ref 26.0–34.0)
MCHC: 34.2 g/dL (ref 30.0–36.0)
MCV: 90.1 fL (ref 78.0–100.0)
MONO ABS: 0.6 10*3/uL (ref 0.1–1.0)
Monocytes Relative: 11 %
Neutro Abs: 4.2 10*3/uL (ref 1.7–7.7)
Neutrophils Relative %: 79 %
PLATELETS: 224 10*3/uL (ref 150–400)
RBC: 4.94 MIL/uL (ref 4.22–5.81)
RDW: 13.6 % (ref 11.5–15.5)
WBC: 5.3 10*3/uL (ref 4.0–10.5)

## 2015-02-02 LAB — PROTIME-INR
INR: 0.95 (ref 0.00–1.49)
Prothrombin Time: 12.9 seconds (ref 11.6–15.2)

## 2015-02-02 MED ORDER — MIDAZOLAM HCL 2 MG/2ML IJ SOLN
INTRAMUSCULAR | Status: AC | PRN
  Administered 2015-02-02 (×2): 1 mg via INTRAVENOUS

## 2015-02-02 MED ORDER — CEFAZOLIN SODIUM-DEXTROSE 2-3 GM-% IV SOLR
INTRAVENOUS | Status: AC
  Administered 2015-02-02: 2 g via INTRAVENOUS
  Filled 2015-02-02: qty 50

## 2015-02-02 MED ORDER — SODIUM CHLORIDE 0.9 % IV SOLN
INTRAVENOUS | Status: DC
  Administered 2015-02-02: 14:00:00 via INTRAVENOUS

## 2015-02-02 MED ORDER — LIDOCAINE VISCOUS 2 % MT SOLN
OROMUCOSAL | Status: AC
  Administered 2015-02-02: 10 mL
  Filled 2015-02-02: qty 15

## 2015-02-02 MED ORDER — MIDAZOLAM HCL 2 MG/2ML IJ SOLN
INTRAMUSCULAR | Status: AC
  Filled 2015-02-02: qty 6

## 2015-02-02 MED ORDER — FENTANYL CITRATE (PF) 100 MCG/2ML IJ SOLN
INTRAMUSCULAR | Status: AC
  Filled 2015-02-02: qty 4

## 2015-02-02 MED ORDER — CEFAZOLIN SODIUM-DEXTROSE 2-3 GM-% IV SOLR
2.0000 g | Freq: Once | INTRAVENOUS | Status: AC
  Administered 2015-02-02: 2 g via INTRAVENOUS

## 2015-02-02 MED ORDER — LIDOCAINE HCL 1 % IJ SOLN
INTRAMUSCULAR | Status: AC
  Filled 2015-02-02: qty 40

## 2015-02-02 MED ORDER — FENTANYL CITRATE (PF) 100 MCG/2ML IJ SOLN
INTRAMUSCULAR | Status: AC | PRN
  Administered 2015-02-02: 50 ug via INTRAVENOUS

## 2015-02-02 NOTE — Discharge Instructions (Signed)
Incision Care °An incision is when a surgeon cuts into your body. After surgery, the incision needs to be cared for properly to prevent infection.  °HOW TO CARE FOR YOUR INCISION °· Take medicines only as directed by your health care provider. °· There are many different ways to close and cover an incision, including stitches, skin glue, and adhesive strips. Follow your health care provider's instructions on: °¨ Incision care. °¨ Bandage (dressing) changes and removal. °¨ Incision closure removal. °· Do not take baths, swim, or use a hot tub until your health care provider approves. You may shower as directed by your health care provider. °· Resume your normal diet and activities as directed. °· Use anti-itch medicine (such as an antihistamine) as directed by your health care provider. The incision may itch while it is healing. Do not pick or scratch at the incision. °· Drink enough fluid to keep your urine clear or pale yellow. °SEEK MEDICAL CARE IF:  °· You have drainage, redness, swelling, or pain at your incision site. °· You have muscle aches, chills, or a general ill feeling. °· You notice a bad smell coming from the incision or dressing. °· Your incision edges separate after the sutures, staples, or skin adhesive strips have been removed. °· You have persistent nausea or vomiting. °· You have a fever. °· You are dizzy. °SEEK IMMEDIATE MEDICAL CARE IF:  °· You have a rash. °· You faint. °· You have difficulty breathing. °MAKE SURE YOU:  °· Understand these instructions. °· Will watch your condition. °· Will get help right away if you are not doing well or get worse. °  °This information is not intended to replace advice given to you by your health care provider. Make sure you discuss any questions you have with your health care provider. °  °Document Released: 07/07/2004 Document Revised: 01/08/2014 Document Reviewed: 02/11/2013 °Elsevier Interactive Patient Education ©2016 [employer] °Moderate Conscious  Sedation, Adult °Sedation is the use of medicines to promote relaxation and relieve discomfort and anxiety. Moderate conscious sedation is a type of sedation. Under moderate conscious sedation you are less alert than normal but are still able to respond to instructions or stimulation. Moderate conscious sedation is used during short medical and dental procedures. It is milder than deep sedation or general anesthesia and allows you to return to your regular activities sooner. °LET YOUR HEALTH CARE PROVIDER KNOW ABOUT:  °· Any allergies you have. °· All medicines you are taking, including vitamins, herbs, eye drops, creams, and over-the-counter medicines. °· Use of steroids (by mouth or creams). °· Previous problems you or members of your family have had with the use of anesthetics. °· Any blood disorders you have. °· Previous surgeries you have had. °· Medical conditions you have. °· Possibility of pregnancy, if this applies. °· Use of cigarettes, alcohol, or illegal drugs. °RISKS AND COMPLICATIONS °Generally, this is a safe procedure. However, as with any procedure, problems can occur. Possible problems include: °· Oversedation. °· Trouble breathing on your own. You may need to have a breathing tube until you are awake and breathing on your own. °· Allergic reaction to any of the medicines used for the procedure. °BEFORE THE PROCEDURE °· You may have blood tests done. These tests can help show how well your kidneys and liver are working. They can also show how well your blood clots. °· A physical exam will be done.   °· Only take medicines as directed by your health care provider. You may need   to stop taking medicines (such as blood thinners, aspirin, or nonsteroidal anti-inflammatory drugs) before the procedure.   °· Do not eat or drink at least 6 hours before the procedure or as directed by your health care provider. °· Arrange for a responsible adult, family member, or friend to take you home after the procedure.  He or she should stay with you for at least 24 hours after the procedure, until the medicine has worn off. °PROCEDURE  °· An intravenous (IV) catheter will be inserted into one of your veins. Medicine will be able to flow directly into your body through this catheter. You may be given medicine through this tube to help prevent pain and help you relax. °· The medical or dental procedure will be done. °AFTER THE PROCEDURE °· You will stay in a recovery area until the medicine has worn off. Your blood pressure and pulse will be checked.   °·  Depending on the procedure you had, you may be allowed to go home when you can tolerate liquids and your pain is under control. °  °This information is not intended to replace advice given to you by your health care provider. Make sure you discuss any questions you have with your health care provider. °  °Document Released: 09/12/2000 Document Revised: 01/08/2014 Document Reviewed: 08/25/2012 °Elsevier Interactive Patient Education ©2016 [employer] ° °

## 2015-02-02 NOTE — H&P (Signed)
Chief Complaint: Patient was seen in consultation today for removal of his Port A Cath and his gastrostomy tube at the request of Gorsuch,Ni  Referring Physician(s): Gorsuch,Ni  History of Present Illness: Dylan Moreno is a 75 y.o. male with history of tongue cancer who previously had a Port A cath and a Gastrostomy tube placed by Dr. Annamaria Boots back on April 02, 2014.  He states both worked well for the duration and he is happy he is here to have them removed today.  He confirms he has completed his chemotherapy and he has no trouble swallowing.  He feels well today and denies any recent illness, fever, or chills.  He is NPO and has held his Plavix since the 27th.  Past Medical History  Diagnosis Date  . Hyperlipemia   . Arthritis   . ADHD (attention deficit hyperactivity disorder)   . Wears glasses   . Hypercholesterolemia   . Peyronie's disease   . Kidney stones   . TIA (transient ischemic attack)   . Hypertension   . Anxiety   . GERD (gastroesophageal reflux disease)   . Cancer of base of tongue ([place]) 03/05/14    squamous cell carcinoma  . TIA (transient ischemic attack) 12/29/12    left facial droop  . Dysuria 07/15/2014    Past Surgical History  Procedure Laterality Date  . Dupuytren contracture release  2012    left hand  . Tonsillectomy    . Shoulder arthroscopy w/ rotator cuff repair      left  . Colonoscopy    . Cystoscopy w/ ureteroscopy w/ lithotripsy  2011  . Hand surgery      right  . Dupuytren contracture release Right 10/30/2012    Procedure: DUPUYTREN CONTRACTURE RELEASE RIGHT PALM,RING AND SMALL FINGER;  Surgeon: Cammie Sickle., MD;  Location: [facility];  Service: Orthopedics;  Laterality: Right;    Allergies: Review of patient's allergies indicates no known allergies.  Medications: Prior to Admission medications   Medication Sig Start Date End Date Taking? Authorizing Provider  amphetamine-dextroamphetamine (ADDERALL)  20 MG tablet Take 20 mg by mouth daily. Reported on 12/22/2014   Yes Historical Provider, MD  atorvastatin (LIPITOR) 20 MG tablet Place 1 tablet (20 mg total) into feeding tube daily at 6 PM. Patient taking differently: Take 20 mg by mouth daily at 6 PM.  06/03/14  Yes Janece Canterbury, MD  levothyroxine (SYNTHROID, LEVOTHROID) 75 MCG tablet Place 1 tablet (75 mcg total) into feeding tube daily before breakfast. Patient taking differently: Take 75 mcg by mouth daily before breakfast.  06/03/14  Yes Janece Canterbury, MD  sodium fluoride (PREVIDENT 5000 PLUS) 1.1 % CREA dental cream Apply to tooth brush. Brush teeth for 2 minutes. Spit out excess-DO NOT swallow. Repeat nightly. 06/28/14  Yes Lenn Cal, DDS  thiamine 100 MG tablet Take 100 mg by mouth daily.    Yes Historical Provider, MD  clopidogrel (PLAVIX) 75 MG tablet Place 1 tablet (75 mg total) into feeding tube daily. Patient taking differently: Take 75 mg by mouth daily.  06/03/14   Janece Canterbury, MD     Family History  Problem Relation Age of Onset  . Heart attack Mother     Deceased, 12  . Alcohol abuse Mother   . Hypothyroidism Father     Deceased, 56  . Healthy Son   . Cancer Paternal Uncle     ?lung ca  . Melanoma Brother   . Alcohol  abuse Brother     Social History   Social History  . Marital Status: Married    Spouse Name: Vicente Males  . Number of Children: 1  . Years of Education: N/A   Social History Main Topics  . Smoking status: Former Smoker -- 1.00 packs/day for 40 years    Types: Cigarettes, Cigars    Quit date: 03/02/2014  . Smokeless tobacco: Never Used     Comment: smokes occasionally  . Alcohol Use: No     Comment: every night (2-3oz of scotch or gin martini)  . Drug Use: No  . Sexual Activity: Not Asked     Comment: mostly quit 2011-smokes occ   Other Topics Concern  . 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   He lives at home and has one grown son.    Son currently living with parents due to back surgery.   Highest level of education: 1.5 years of college    A 12 point ROS discussed. Review of Systems  Constitutional: Negative for fever, chills, activity change, appetite change and fatigue.  HENT: Negative.   Respiratory: Negative for cough, chest tightness and shortness of breath.   Cardiovascular: Negative for chest pain.  Gastrointestinal: Negative for nausea, vomiting, abdominal pain and abdominal distention.       Gastrostomy tube remains in place.  Genitourinary: Negative.   Musculoskeletal: Negative.   Skin: Negative.   Neurological: Negative.   Psychiatric/Behavioral: Negative.     Vital Signs: Ht 6\' 1"  (1.854 m)  Wt 162 lb 7 oz (73.681 kg)  BMI 21.44 kg/m2  Physical Exam  Constitutional: He is oriented to person, place, and time. He appears well-developed and well-nourished.  HENT:  Head: Atraumatic.  Eyes: EOM are normal.  Neck: Normal range of motion. Neck supple.  Cardiovascular: Normal rate, regular rhythm and normal heart sounds.   Pulmonary/Chest: Effort normal and breath sounds normal. No respiratory distress.  Abdominal: Soft. Bowel sounds are normal.  Gastrostomy tube in place  Musculoskeletal: Normal range of motion. He exhibits no tenderness.  Neurological: He is alert and oriented to person, place, and time.  Skin: Skin is warm and dry.  Psychiatric: He has a normal mood and affect. His behavior is normal. Judgment and thought content normal.  Vitals reviewed.   Mallampati Score:  MD Evaluation Airway: WNL Heart: WNL Abdomen: WNL Chest/ Lungs: WNL ASA  Classification: 3 Mallampati/Airway Score: One  Imaging: No results found.  Labs:  CBC:  Recent Labs  07/01/14 0959 07/21/14 1012 12/21/14 0808 02/02/15 1300  WBC 4.1 4.7 4.3 5.3  HGB 11.4* 12.9* 14.0 15.2  HCT 35.3* 38.5 42.4 44.5  PLT 224 221 223 224    COAGS:  Recent Labs  04/02/14 1230 02/02/15 1300  INR 0.95 0.95    APTT 31  --     BMP:  Recent Labs  06/01/14 0438 06/02/14 0435 06/03/14 0545 06/04/14 1352 07/01/14 0959 07/21/14 1013 09/21/14 1512 11/02/14 1013  NA 131* 133* 131* 132* 140 136 135* 138  K 4.4 4.9 4.3 4.6 4.4 4.4 4.4 4.5  CL 94* 96* 96* 96*  --   --   --   --   CO2 27 26 26 26 27 26 28 25   GLUCOSE 130* 134* 114* 111* 119 125 84 131  BUN 30* 35* 32* 33* 30.1* 24.4 27.7* 23.9  CALCIUM 9.4 9.5 9.2 9.5 10.3 10.6* 9.8 10.0  CREATININE 0.69 0.81  0.74 1.01 1.0 1.1 0.9 1.1  GFRNONAA >60 >60 >60 >60  --   --   --   --   GFRAA >60 >60 >60 >60  --   --   --   --     LIVER FUNCTION TESTS:  Recent Labs  07/01/14 0959 07/21/14 1013 09/21/14 1512 11/02/14 1013  BILITOT 0.90 0.91 0.54 0.87  AST 20 22 22 22   ALT 23 24 24 23   ALKPHOS 89 95 100 97  PROT 6.9 7.3 6.9 6.9  ALBUMIN 3.7 4.1 3.8 3.9    TUMOR MARKERS: No results for input(s): AFPTM, CEA, CA199, CHROMGRNA in the last 8760 hours.  Assessment and Plan:  History of tongue cancer.  Completion of chemotherapy and no dysphagia  Will proceed with removal of Port A Cath and Gastrostomy tube today.  Risks and Benefits discussed with the patient including, but not limited to bleeding, infection.   All of the patient's questions were answered, patient is agreeable to proceed. Consent signed and in chart.   Electronically Signed: Murrell Redden PA-C 02/02/2015, 1:48 PM   I spent a total of 25 Minutes in face to face in clinical consultation, greater than 50% of which was counseling/coordinating care for removal of Port and gastrostomy tube

## 2015-02-02 NOTE — Procedures (Signed)
RIJV PAC removal G tube removal No comp/EBL

## 2015-02-17 ENCOUNTER — Telehealth: Payer: Self-pay | Admitting: *Deleted

## 2015-02-17 NOTE — Telephone Encounter (Signed)
CALLED PATIENT TO INFORM OF FU WITH DR. Isidore Moos ON 04-22-15 @ 1 PM AND HIS SURVIVORSHIP APPT. WITH GRETCHEN DAWSON ON 04-22-15 @ 1:30 PM, NOTIFIED MR. Antonopoulos THAT THE MARCH 2 APPT. WITH GRETCHEN DAWSON HAS BEEN CANCELLED, LVM FOR A RETURN CALL

## 2015-02-25 DIAGNOSIS — S2020XA Contusion of thorax, unspecified, initial encounter: Secondary | ICD-10-CM | POA: Diagnosis not present

## 2015-03-03 ENCOUNTER — Encounter: Payer: Self-pay | Admitting: Adult Health

## 2015-03-15 DIAGNOSIS — Z1389 Encounter for screening for other disorder: Secondary | ICD-10-CM | POA: Diagnosis not present

## 2015-03-15 DIAGNOSIS — K219 Gastro-esophageal reflux disease without esophagitis: Secondary | ICD-10-CM | POA: Diagnosis not present

## 2015-03-15 DIAGNOSIS — E039 Hypothyroidism, unspecified: Secondary | ICD-10-CM | POA: Diagnosis not present

## 2015-03-15 DIAGNOSIS — R972 Elevated prostate specific antigen [PSA]: Secondary | ICD-10-CM | POA: Diagnosis not present

## 2015-03-15 DIAGNOSIS — G459 Transient cerebral ischemic attack, unspecified: Secondary | ICD-10-CM | POA: Diagnosis not present

## 2015-03-15 DIAGNOSIS — E7800 Pure hypercholesterolemia, unspecified: Secondary | ICD-10-CM | POA: Diagnosis not present

## 2015-03-15 DIAGNOSIS — Z0001 Encounter for general adult medical examination with abnormal findings: Secondary | ICD-10-CM | POA: Diagnosis not present

## 2015-03-15 DIAGNOSIS — Z125 Encounter for screening for malignant neoplasm of prostate: Secondary | ICD-10-CM | POA: Diagnosis not present

## 2015-03-15 DIAGNOSIS — F908 Attention-deficit hyperactivity disorder, other type: Secondary | ICD-10-CM | POA: Diagnosis not present

## 2015-03-15 DIAGNOSIS — C760 Malignant neoplasm of head, face and neck: Secondary | ICD-10-CM | POA: Diagnosis not present

## 2015-03-30 ENCOUNTER — Encounter: Payer: Self-pay | Admitting: Hematology and Oncology

## 2015-03-30 NOTE — Progress Notes (Signed)
left for dr. gorsuch to sign °

## 2015-03-31 ENCOUNTER — Encounter: Payer: Self-pay | Admitting: Hematology and Oncology

## 2015-03-31 NOTE — Progress Notes (Signed)
placed for patient pick per debbie wilson [phone] front with ms wilma for pick up

## 2015-04-06 ENCOUNTER — Encounter: Payer: Self-pay | Admitting: Hematology and Oncology

## 2015-04-06 NOTE — Progress Notes (Signed)
Cancer form copy sent to medical records

## 2015-04-13 DIAGNOSIS — R972 Elevated prostate specific antigen [PSA]: Secondary | ICD-10-CM | POA: Diagnosis not present

## 2015-04-13 DIAGNOSIS — E7800 Pure hypercholesterolemia, unspecified: Secondary | ICD-10-CM | POA: Diagnosis not present

## 2015-04-21 ENCOUNTER — Encounter: Payer: Self-pay | Admitting: Adult Health

## 2015-04-21 ENCOUNTER — Other Ambulatory Visit: Payer: Self-pay | Admitting: Adult Health

## 2015-04-21 DIAGNOSIS — R5383 Other fatigue: Secondary | ICD-10-CM

## 2015-04-21 DIAGNOSIS — C01 Malignant neoplasm of base of tongue: Secondary | ICD-10-CM

## 2015-04-21 NOTE — Progress Notes (Unsigned)
Survivorship Care Plan  Provided by Dylan Craze, NP on 04/13/2015     General Information  Patient name Dylan Moreno. Colomb  Patient ID [id number]  Date of birth [birth year]    Your Care Team  Patient Care Team: Seward Carol, MD as PCP - General (Internal Medicine) Heath Lark, MD - Medical Oncologist Jodi Marble, MD - Otolaryngology (ENT) Eppie Gibson, MD - Radiation Oncologist Teena Dunk, DDS - Dentist Gayleen Orem, RN - Nurse Navigator Garald Balding, SLP - Speech Therapist Serafina Royals, PT - Physical Therapist Polo Riley, LCSW - Social Worker Dory Peru, [place] - Dietitian Dylan Craze, NP - Survivorship Nurse Practitioner  *To reach your [facility] providers, call [phone].   Cancer Diagnosis Information  Diagnosis Base of tongue cancer  Diagnosis Date 03/05/14  Staging Malignant neoplasm of base of tongue ([place])   Staging form: Lip and Oral Cavity, AJCC 7th Edition     Clinical stage: Stage IVA (T2, N2c, M0)     Background Information  Family history   Problem Relation  . Heart attack Mother, Deceased age 48  . Alcohol abuse Mother  . Hypothyroidism Father, Deceased 74  . Healthy Son  . Cancer, ? lung cancer Paternal Uncle  . Melanoma Brother  . 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    04/07/2014 - 05/05/2014 Chemotherapy He received 2 doses of high dose cisplatin. He could not receive a third dose due to profound side effects.   04/28/2014 Adverse Reaction Cycle 2 chemotherapy is delayed due to neutropenia   05/25/2014 - 06/03/2014 Hospital Admission He was admitted to the hospital for management of acute delirium.   07/19/2014 Imaging PET/CT, restaging:  Resolution of tongue base lesion; significant decrease inbilateral cervical lymphadenopathy.  No definite evidence of metastatic disease.   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   12/21/2014 Imaging Repeat Ct scan showed no evidence of cancer   02/02/2015 Procedure Port-a-cath and PEG removed.      Treatment goal Curative    You received concurrent (given at the same time) chemotherapy and radiation therapy.     RADIATION THERAPY:  Location of Radiation Base of tongue and bilateral neck  Radiation Period  Start Date: 04/06/14                End Date: 05/25/14  Number of Radiation Treatments 35 treatments  Total Radiation Dose Base of tongue: 70 Gy Neck lymph nodes (high risk): 63 Gy Neck  lymph nodes (intermediate risk): 56 Gy   Potential Late & Long-Term Side Effects of Head & Neck Radiation Therapy   Fatigue  Dry mouth  Altered taste or loss of taste  Trouble swallowing  Jaw muscle spasms/pain  Skin changes in the area treated with radiation  Neck muscle spasms/pain  Lymphedema (swelling in the face and/or neck)  Hypothyroidism (sluggish thyroid gland)  ***Side effects from radiation therapy are dependent on the areas of the body in the treatment field.  These are the most  common late/long-term effects for patients treated for base of tongue cancer.                              CHEMOTHERAPY: Chemotherapy Given With Radiation Therapy Yes   Drugs Received Date Started Date Completed # cycles (number of times drug was received) Comments  [x]   High-dose Cisplatin  04/07/14  05/05/14 2 Cycle #2 was delayed due to decreased blood counts. -Cycle #3 was not given due to severe side effects.     Potential Late & Long-Term Side Effects of Chemotherapy   Fatigue  Neuropathy (numbness/tingling in hands or feet)  Hearing loss (Cisplatin only)  Infertility  Kidney damage/kidney failure  Liver problems  Rare secondary cancers          NUTRITIONAL SUPPORT: Placement of Feeding Tube Yes  Date of Placement 04/02/14  Date of Removal 02/02/15  Placement of Port-a-cath Yes  Date of Placement 04/02/14  Date of Removal 02/02/15             Survivorship Care & Follow-up Schedule  *Becoming a cancer survivor is a time of great celebration, but also a time of many questions and concerns.  The following information and resources are for you, your caregivers, and your primary care doctor to better understand the needs of a cancer survivor.     How often will I see my cancer providers: Survivor Years 0-2  (Survivor Years defined by length of time since cancer diagnosis)  When?  Responsible Provider  Radiation Oncology visits Every 3-6 months Dr. Isidore Moos (alternating with Dr. Erik Obey)  Otolaryngologist (ENT) Every 3-6 months  Dr. Erik Obey (alternating with Dr. Isidore Moos)  Dentist 1 month after treatment, then as needed Dr. Enrique Sack  Lab tests [facility]) Every 6-12 months Dr. Isidore Moos  Scans/Imaging exams None; only if there are new symptoms or new findings on physical exam.  Dr. Isidore Moos or Dr. Erik Obey  Nurse Navigator As needed Gayleen Orem, RN  Dietitian As needed Dory Peru, RD  Speech Therapist Every 2 months for 1st 6 months Garald Balding, SLP    Social Worker As needed Polo Riley, LCSW  Survivorship Nurse Practitioner (NP) As needed Dylan Craze, NP      How often will I see my cancer providers: Survivor Years 2-5 (Survivor Years defined by length of time since cancer diagnosis)  When?  Responsible Provider  Radiation  Oncology visits Annually, until you are about 2 years out from treatment.  Dr. Isidore Moos (alternating with Dr. Erik Obey)  Otolaryngologist (ENT) Annually Dr. Erik Obey (alternating with Dr. Isidore Moos)   Survivorship Nurse Practitioner (NP) Annually, after Dr. Isidore Moos refers you to long-term survivorship! Dylan Craze, NP (alternating with Dr. Erik Obey)    5 Years: GRADUATION!  Dylan Craze, NP  Dentist As directed, 3-4 times per year Dr. Enrique Sack or Primary Care Dentist   Lab tests [facility]) Every 6-12 months  Dr. Isidore Moos or Dylan Craze, NP  Upcoming Appointments:   Appointment Date & Time Who am I seeing?   Otolaryngologist (ENT) Due in  Dr. Erik Obey  Radiation Oncologist Due in  Dr. Isidore Moos      Dentist Please keep your appointments as scheduled. Dr. Enrique Sack or Primary Care Dentist  Survivorship Nurse Practitioner (NP) As needed Dylan Craze, NP  Dietician As needed  Dory Peru, RD  Social Work As needed  [employer], LCSW           Common Complaints/Concerns of Cancer Survivors  *Patients often worry if what they are experiencing is normal, or to be expected, given their cancer history and treatment.  Below are common complaints & concerns reported by cancer survivors. If you are concerned about symptoms you are experiencing, please report all symptoms to your cancer care team!    Common Complaints/Concerns for Head & Neck Cancer Survivors   Fatigue  Memory problems and/or confusion  Depression  Anxiety  Dry mouth  Difficulty swallowing  Changes in your eating habits, your taste, or your smell  Weight changes  Hypothyroidism ("sluggish" thyroid)  Insomnia or trouble  sleeping  Lymphedema (swelling in your neck or face)  Decreased range of motion (difficulty moving neck)  Intimacy and sexuality  Marital/partner/family relationships   Employment, health insurance concerns, and/or finances  Concerns regarding spirituality  Exercise after cancer treatments    Symptoms to Watch For and Report to Your Provider  *Often cancer survivors are fearful about their cancer coming back or being diagnosed with a new cancer.  Below are symptoms that you and your loved ones should watch for and report to your provider.   General Symptoms to Watch For and Report to Your Provider .  Return of the cancer symptoms you had before- such as a lump or new growth where your cancer first started . New or unusual pain that seems unrelated to an injury and does not go away, including back pain or bone pain . Weight loss without trying/intending . Unexplained bleeding . A rash or allergic reaction, such as swelling, severe itching, or wheezing . Chills or fevers . Persistent headaches . Shortness of breath or difficulty breathing . Bloody stools or blood in your urine . Lumps, bumps, swelling and/or nipple discharge . Nausea, vomiting, diarrhea, loss of appetite, or trouble swallowing . A cough that doesn't go away . Abdominal pain . Swelling in your arms or legs . Fractures . Any other signs mentioned by your doctor or nurse or any unusual symptoms                 that you just can't explain   NOTE: Just because you have certain symptoms, it doesn't mean the cancer has come back or you have a new cancer. Symptoms can be due to other problems that need to be addressed.  It is important to watch for these symptoms and report them to your provider so you can be medically evaluated for any of these concerns!       What Now?  Thriving & Surviving In Your Life After Cancer   Consider participating in "Finding Your New Normal" [facility]) survivorship series.   [employer]  [employer] fitness program for cancer survivors.   Keep your follow-up appointments with all of your specialists (Speech Therapist, Dietician, Physical Therapist, Dentist, etc.)  Consider FREE counseling at cancer center.  For more information, contact Ottis Stain at [phone].  Attend support group and other Patient & [facility]isher class/program offerings.   Stop  smoking or continue to abstain from smoking. If you need help with smoking cessation, talk to your healthcare team about different options to help you!  Limit alcohol consumption or abstain from consuming alcohol all together.   Maintain adequate nutrition & fluid intake    Thank you so much for allowing [facility] to care for you during your cancer experience.  Our continued commitment is to you and your caregiver(s) health and happiness and again, congratulations on completing treatment & transitioning to survivorship!     Survivorship Nurse Practitioner contact:  Dylan Craze, NP Survivorship Program [facility] [address] [place], [place] [address] [phone]

## 2015-04-22 ENCOUNTER — Encounter: Payer: Self-pay | Admitting: Adult Health

## 2015-04-22 ENCOUNTER — Ambulatory Visit
Admission: RE | Admit: 2015-04-22 | Discharge: 2015-04-22 | Disposition: A | Discharge status: Home / Self Care | Payer: Medicare Other | Source: Ambulatory Visit | Attending: Radiation Oncology | Admitting: Radiation Oncology

## 2015-04-22 ENCOUNTER — Encounter: Payer: Self-pay | Admitting: Radiation Oncology

## 2015-04-22 ENCOUNTER — Ambulatory Visit (HOSPITAL_BASED_OUTPATIENT_CLINIC_OR_DEPARTMENT_OTHER): Payer: Medicare Other | Admitting: Adult Health

## 2015-04-22 VITALS — BP 114/73 | HR 68 | Temp 98.1°F | Resp 14 | Wt 165.6 lb

## 2015-04-22 VITALS — BP 114/73 | HR 68 | Temp 98.1°F | Ht 73.0 in | Wt 165.6 lb

## 2015-04-22 DIAGNOSIS — Z7289 Other problems related to lifestyle: Secondary | ICD-10-CM

## 2015-04-22 DIAGNOSIS — Z87891 Personal history of nicotine dependence: Secondary | ICD-10-CM | POA: Diagnosis not present

## 2015-04-22 DIAGNOSIS — E039 Hypothyroidism, unspecified: Secondary | ICD-10-CM

## 2015-04-22 DIAGNOSIS — R1310 Dysphagia, unspecified: Secondary | ICD-10-CM | POA: Diagnosis not present

## 2015-04-22 DIAGNOSIS — R269 Unspecified abnormalities of gait and mobility: Secondary | ICD-10-CM

## 2015-04-22 DIAGNOSIS — I890 Lymphedema, not elsewhere classified: Secondary | ICD-10-CM

## 2015-04-22 DIAGNOSIS — B370 Candidal stomatitis: Secondary | ICD-10-CM

## 2015-04-22 DIAGNOSIS — C01 Malignant neoplasm of base of tongue: Secondary | ICD-10-CM | POA: Diagnosis not present

## 2015-04-22 MED ORDER — FLUCONAZOLE 100 MG PO TABS: 100.0000 mg | ORAL_TABLET | Freq: Every day | ORAL | Status: DC

## 2015-04-22 NOTE — Patient Instructions (Signed)
-  Start the Fluconazole today.  Take 2 tablets at once today (to total 200 mg).  You will then take 100 mg tablets once daily until you run out of the pills.  -Someone will be in touch with you for the following:  *Your appt for your low-dose CT scan of your chest  *Your appt with Dr. Erik Obey  *Your appt with Dr. Isidore Moos   -Restart your fluoride trays once daily (after you finish the course of Fluconazole).  -Talk to your trainer about potential exercises to strengthen your leg muscles to help with your balance concerns and/or mention your concerns to Dr. Posey Pronto (your neurologist) or Dr. Delfina Redwood (your PCP).   Feel free to call me with any questions! Mike Craze, NP [phone]

## 2015-04-22 NOTE — Progress Notes (Signed)
Patient seen briefly with Dylan Craze, NP. He is doing well overall. On PE, there is thrush in the retromolar trigone bilaterally. No palpable neck masses. No mucosal signs of tumor. Skin intact.  He is without evidence of disease.   Elzie Rings will order Fluconazole for the patient and he'll hold his statin medication while on this.  Follow up with me later this year, per her orders.  -----------------------------------  Eppie Gibson, MD

## 2015-04-22 NOTE — Progress Notes (Signed)
CLINIC:  Survivorship    REASON FOR VISIT:  Survivorship Care Plan visit & follow-up post-treatment for a recent history of head & neck cancer.  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    04/07/2014 - 05/05/2014 Chemotherapy He received 2 doses of high dose cisplatin. He could not received a third dose due to profound side effects.   04/28/2014 Adverse Reaction Cycle 2 chemotherapy is delayed due to neutropenia   05/25/2014 - 06/03/2014 Hospital Admission He was admitted to the hospital for management of acute delirium.   07/19/2014 Imaging PET/CT, restaging:  Resolution of tongue base lesion; significant decrease inbilateral cervical lymphadenopathy.  No definite evidence of metastatic disease.   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   12/21/2014 Imaging Repeat Ct scan showed no evidence of cancer   02/02/2015 Procedure Port-a-cath and PEG removed.    INTERVAL HISTORY:  Dylan Moreno presents to the Survivorship Clinic today to review his survivorship care plan. Since his last visit to the cancer center, Dylan Moreno underwent biopsy of his soft palate  in 11/2014, which revealed squamous papilloma.  His repeat CT scan in 12/2014 should no evidence of cancer, as well as improvement in neck lymphadenopathy.  He saw his neurologist in late 01/2015 and pt is noted to have mild cognitive impairment, as well as alcoholic peripheral neuropathy.   Overall, Dylan Moreno reports quite well.  He is struggling with his memory loss issues, as well as unsteady gait for which he is followed by other specialists.  He is working out with a Physiological scientist 2 days per week; he is also walking a few times per week which he enjoys.  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 He endorses dysgeusia, but it has improved some.  He is able to enjoy several foods now that he was unable to shortly after he finished treatment.       Pain: None  Nutritional Status:  -Intake/Diet: Drinks 3-4 Boosts/Ensures per day; not eating a wide variety of foods; does not like  -Using a feeding tube? No -Weight change: (loss/gain) since (date)  Swallowing:  Some dysphagia; not doing swallowing exercises.  Periodically feels like food is getting "stuck in my throat."    Last ENT visit:  ([facility])-11/10/14 per patient   Last Dentist visit:  "Sometime last year"; Primary Care Dentist   Summary of last Med Onc visit: 12/22/14 ([facility])-NED; plan to get port and feeding tube removed when able to maintain adequate intake; should continue close follow-up with ENT  and Rad Onc; transition to survivorship  Summary of last Rad Onc visit:  11/05/14 ([facility])-new palate lesion, refer to [facility] for possible biopsy; follow-up in 04/2015 with TSH and see survivorship at that time as well.    Last TSH value: TSH 2.987 on 05/27/14  Last Imaging:  CT neck (12/21/14)-Continued favorable evolution. Lymph nodes in the left neck continue to get smaller. The largest level 2 node shows  reduction of short axis diameter from 13 mm to 9 mm. No new or enlarging nodes.  No evidence of residual central tongue base mass.   ADDITIONAL REVIEW OF SYSTEMS:  Review of Systems  Constitutional: Negative for weight loss.  HENT: Negative for sore throat.   Respiratory: Negative for cough.   Gastrointestinal: Negative for nausea, vomiting, diarrhea, constipation and blood in stool.  Genitourinary: Positive for frequency. Negative for dysuria and hematuria.  Musculoskeletal: Negative for falls.  Skin: Negative for rash.  Neurological: Negative for dizziness and headaches.       Unsteady gait; "my legs don't seem to work right and I lose my balance sometimes."  Denies any falls; followed by Neurology   Psychiatric/Behavioral: Positive for memory loss. Negative for depression. The patient is not nervous/anxious.        Followed by neurology for memory loss      PAST MEDICAL/SURGICAL HISTORY:  Past Medical History  Diagnosis Date  . Hyperlipemia   . Arthritis   . ADHD (attention deficit hyperactivity disorder)   . Wears glasses   . Hypercholesterolemia   . Peyronie's disease   . Kidney stones   . TIA (transient ischemic attack)   . Hypertension   . Anxiety   . GERD (gastroesophageal reflux disease)   . Cancer of base of tongue ([place]) 03/05/14    squamous cell carcinoma  . TIA (transient ischemic attack) 12/29/12    left facial droop  . Dysuria 07/15/2014   Past Surgical History  Procedure Laterality Date  . Dupuytren contracture release  2012    left hand  . Tonsillectomy    . Shoulder arthroscopy w/ rotator cuff repair      left  . Colonoscopy    . Cystoscopy w/ ureteroscopy w/ lithotripsy  2011  . Hand surgery      right  . Dupuytren contracture release Right 10/30/2012    Procedure: DUPUYTREN CONTRACTURE RELEASE RIGHT PALM,RING AND SMALL FINGER;  Surgeon: Cammie Sickle., MD;  Location: [facility];  Service: Orthopedics;  Laterality: Right;      ALLERGIES:  No Known Allergies    CURRENT MEDICATIONS:  Current Outpatient Prescriptions on File Prior to Visit  Medication Sig Dispense Refill  . amphetamine-dextroamphetamine (ADDERALL) 20 MG tablet Take 20 mg by mouth daily. Reported on 12/22/2014    . atorvastatin (LIPITOR) 20 MG tablet Place 1 tablet (20 mg total) into feeding tube daily at 6 PM. (Patient taking differently: Take 20 mg by mouth daily at 6 PM. ) 30 tablet 0  . clopidogrel (PLAVIX) 75 MG tablet Place 1 tablet (75 mg total) into feeding tube daily. (Patient taking differently: Take 75 mg by mouth daily. ) 30 tablet 0  . levothyroxine (SYNTHROID, LEVOTHROID) 75 MCG tablet Place 1 tablet (75 mcg total) into feeding tube daily before breakfast. (Patient taking differently: Take 75 mcg by mouth daily before breakfast. ) 30 tablet 0  . sodium fluoride (PREVIDENT 5000 PLUS) 1.1 % CREA dental cream Apply to tooth brush. Brush teeth for 2 minutes. Spit out excess-DO  NOT swallow. Repeat nightly. 1 Tube prn  . thiamine 100 MG tablet Take 100 mg by mouth daily.      No current facility-administered medications on file prior to visit.       ONCOLOGIC FAMILY HISTORY:  Family History  Problem Relation Age of Onset  . Heart attack Mother     Deceased, 9  . Alcohol abuse Mother   . Hypothyroidism Father     Deceased, 32  . Healthy Son   . Cancer Paternal Uncle     ?lung ca  . Melanoma Brother   . Alcohol abuse Brother       SOCIAL HISTORY:  Mr. Lathan is married and lives with his wife in [place], [place].  They have 1 son. Mr. Ryer  is currently retired and previously worked in Librarian, academic.  He denies any current use of tobacco; he previously smoked 1 pack per day for 40 years. He also smoked cigars.  He quit smoking altogether in 2016; he has not smoked any since that time.  He does endorse continued alcohol consumption, mostly wine, which he drinks most days of the week.     PHYSICAL  EXAMINATION:  Vital Signs: Filed Vitals:   04/22/15 1336  BP: 114/73  Pulse: 68  Temp: 98.1 F (36.7 C)  Resp: 14      Weigh Date  165 lb 9.6 oz (75.116 kg) 04/22/15  165 lb 12.8 oz (75.206 kg) 12/22/14  165 lb 4.8 oz (74.98 kg) 11/05/14  166 lb 6.4 oz (75.479 kg) 09/22/14  170 lb (77.111 kg) 07/21/14  174 lb 3.2 oz (79.017 kg) 07/01/14  171 lb 6.4 oz (77.747 kg) 05/24/14  181 lb 3.2 oz (82.192 kg) 05/17/14  183 lb 4.8 oz (83.144 kg) 04/19/14  190 lb 3.2 oz (86.274 kg) 04/12/14  185 lb (83.915 kg) Pre-treatment: 03/17/14   General: Chronically-ill appearing male in no acute distress.  Accompanied by his wife. HEENT: Head is normocephalic.  Pupils equal and reactive to light. Conjunctivae clear without exudate.  Sclerae anicteric. Oral mucosa is pink and slightly dry. Left buccal mucosa with very small blood blister/petechiae that looks like trauma from teeth. There are several curd-like white areas of ? Thrush vs. Accumulated food in the crevices in the back of the mouth in the buccal mucosa (near molars on the top and bottom).  White material able to be scraped off with tongue blade. Tongue pink and dry; no white patches on tongue. No palpable tongue or floor of mouth masses.  Oropharynx is pink without lesions.   Lymph: No cervical, supraclavicular, or infraclavicular lymphadenopathy noted on palpation.   Neck: No palpable masses. Mild fibrosis to anterior neck. Also mild anterior neck lymphedema. Skin on neck is dry and intact.  Cardiovascular: Normal rate and rhythm. Respiratory: Clear to auscultation bilaterally. Chest expansion symmetric without accessory muscle use. Breathing non-labored.  GI: Abdomen soft and round. No tenderness to palpation. Bowel sounds normoactive in 4 quadrants. No hepatosplenomegaly.  GU: Deferred.   Neuro: No focal deficits. Unsteady gait.   Psych: Normal mood and affect for situation. Extremities: No edema, cyanosis, or clubbing.   Skin: Warm and dry.    LABORATORY DATA:  None for this visit.   DIAGNOSTIC IMAGING:  None for this visit.     ASSESSMENT AND PLAN:  Dylan Moreno is a pleasant 75 y.o. 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  Patient presents to survivorship clinic today for routine follow-up after finishing treatment and to receive his survivorship care plan.   1. Base of tongue cancer:  Dylan Moreno is continuing to recover from the effects of cancer treatment.  Per surveillance protocol, he will follow-up with his ENT physician in July or August 2017 with history and physical exam.  He will see Dr. Isidore Moos for surveillance visit sometime in November or December 2017.  Today, a comprehensive survivorship care plan and treatment summary was reviewed with the patient today detailing his head & neck cancer diagnosis, treatment course, potential late/long-term effects of treatment, appropriate follow-up care with recommendations for the future, and patient education resources.  A copy of this summary, along with a letter will be sent to the patient's primary care provider via mail/fax/In Basket message after today's visit.  Mr. Trammel is welcome to return to the Survivorship Clinic in the future, as needed.  When he is approximately 2 years out from his diagnosis and treatment, he may be referred back to Survivorship for continued surveillance until his 5 year "graduation" from follow-up at the cancer center.   2. Potential oral candidiasis: Dylan Moreno oral exam is suspicious for a mild case of oral mucosal candidiasis.  I have prescribed 1 week's worth of Diflucan therapy for him (Take 200 mg on Day 1, then 100 mg daily on Days 2-7).  A paper prescription with instructions on how to take was given to the patient today.  He was also strictly advised not to take his Atorvastatin for the next week due to drug-drug interactions  between Diflucan and statins.  He and his wife voiced understanding and agreed with the plan.    3. Nutritional status: Dylan Moreno weight has been stable for the past 7 months, which is very encouraging. I acknowledged his efforts and encouraged him to keep up the good work.  I did encourage him to try some new foods and experiment with some different fruits/vegetables to try to help his dysgeusia.  He can also adjust his Boost/Ensure intake to make up for any calories he is unable to consume with a regular diet.  We will continue to monitor his weight and oral intake over time to ensure stability.    4. Dysphagia: Given Dylan Moreno's treatment for base of tongue cancer, which included radiation therapy, he is at risk for chronic dysphagia.  He reports having some periodic difficulty with with choking sensation with some foods.  He was reported evaluated by Dr. Erik Obey in [id number] and was told at that time that he did not feel it was appropriate for esophageal dilatation at that time.  I encouraged him to restart his swallowing exercises, as directed by Garald Balding, SLP.  I reiterated the importance of building strength in his neck and mouth to prevent If Mr. Zeleznik requires further swallowing or speech therapy evaluation, I will be happy to place that referral, if needed.  Currently, the patient's reported swallowing concerns appear to be stable.     5.  Neck lymphedema:  When patients with head & neck cancers are treated with surgery and/or radiation therapy, there is an associated increased risk of neck lymphedema.  Dylan Moreno has mild symptoms and mild lymphedema noted on physical exam.  I reviewed the purpose of our lymph system and how lymphedema develops.  I encouraged him to continue to wear his compression garment and perform the massage exercises.  This will likely be a chronic  issue for this patient. If his symptoms worsen, I would happy to place a formal referral to physical therapy for  further evaluation and treatment.     6.  Hypothyroidism: Dylan Moreno has a history of hypothyroidism prior to his cancer diagnosis.  The thyroid gland is often affected after treatment for head & neck cancer. The most recent TSH we have on file was in 05/2014 and was normal.  Dylan Moreno and his wife state that pt recently had labs drawn with his PCP.  We will defer to his PCP to continue to manage his thyroid function.   7. Unsteady gait: This has been a chronic issue for him and therefore do not believe it to be related to his history of H&N cancer.  I encouraged him to talk with his neurologist about potential interventions to manage his gait.   I also encouraged him to talk with his personal trainer to work on leg strengthening exercises to prevent any falls and provide him with improved lower extremity strength and stamina.   8. At risk for tooth decay/dental concerns: After treatment with radiation for head & neck cancers, patients often experience xerostomia which increases their risk of dental caries. Dylan Moreno was encouraged to see his dentist 3-4 times per year.  He has not been using his fluoride trays at all.  I encouraged him to restart the fluoride trays daily in order to help prevent future caries.  He is unsure if he still has the trays; I let him know that either his primary care dentist or Dr. Enrique Sack could potentially help get him new trays made.  I encouraged him to reach out to either Dr. Enrique Sack (oncology dentist) or his primary care dentist with additional questions or concerns.   9.  Lung cancer screening:  [facility] now offers eligible patients lung cancer screening with a low-dose chest CT to aid in early detection, provide more effective treatment options, and ultimately improve survival benefits for patients diagnosed early.  Below is the selection criteria for screening:  . Medicare patients: 55-77 years; privately insured patients 55-80 years. . Active or former smokers  who have quit within the last 15 years. . 30+ pack-year history of smoking  . Exclusion criteria - No signs/symptoms of lung cancer (i.e., no recent history of hemoptysis and no unexplained weight loss >15 pounds in the last 6 months). . Willing and healthy enough to undergo biopsies/surgery if needed.  Dylan Moreno meets criteria for lung cancer screening.  Therefore, I have placed the orders/referral for lung cancer screening protocol.    10. Tobacco & alcohol use: Dylan Moreno reports that he quit smoking in 2016 and continues to abstain from all tobacco products.  I congratulated his continued efforts to remain tobacco free.  He does however continue to drink alcohol. He states that he really enjoys drinking, but has "really cut down" how much he drinks since he was diagnosed with cancer. Both tobacco and alcohol use in patients with head & neck cancer increases the risk of recurrence.  They also increase the risk of other cancers, as well.  Dylan Moreno states that he voiced understanding of the importance of remaining tobacco-free and understands he needs to abstain from alcohol as well.    11. Health maintenance and wellness promotion: Cancer patients who consume a diet rich in fruits and vegetables have better overall health and decreased risk of cancer recurrence. Dylan Moreno was encouraged to consume 5-7 servings of fruits and vegetables per  day, as tolerated. Even if he cannot eat these foods whole, I encouraged him to try fruits/veggies in smoothies.  We reviewed the "Nutrition Rainbow" handout. He was also encouraged to engage in moderate to vigorous exercise for 30 minutes per day most days of the week. We discussed the [employer] fitness program, which is designed for cancer survivors to help them become more physically fit after cancer treatments. He currently has a Physiological scientist, but he was given the brochure about [employer] in case he would like to enroll in that program as well.    12. Support services/counseling: It is not uncommon for this period of the patient's cancer care trajectory to be one of many emotions and stressors.  Dylan Moreno and his wife have previously participated in [facility]") support group series, designed for patients after they have completed treatment. He and his wife also recently attended the H&N Survivor Celebration at the cancer center.  Dylan Moreno was encouraged to take advantage of our many other support services programs, support groups, and/or counseling in coping with his new life as a cancer survivor after completing anti-cancer treatment.  He was offered support today through active listening and expressive supportive counseling.  He was given information regarding our available services and encouraged to contact me with any questions or for help enrolling in any of our support group/programs.     Dispo:  -See Dr. Erik Obey (ENT) in July or August 2017 -Return to cancer center to see Dr. Isidore Moos in November or December 2017 -Consider referring the patient back to survivorship for long-term surveillance, when clinically appropriate.    A total of 60 minutes of face-to-face time was spent with this patient with greater than 50% of that time in counseling and care-coordination.   Mike Craze, NP [place] [phone]

## 2015-04-25 ENCOUNTER — Telehealth: Payer: Self-pay | Admitting: *Deleted

## 2015-04-25 NOTE — Telephone Encounter (Signed)
  Oncology Nurse Navigator Documentation  Navigator Location: [facility]-Med Onc (04/25/15 0851) Navigator Encounter Type: Telephone (04/25/15 0851) Telephone: Outgoing Call (04/25/15 [id number])                 Interventions: Coordination of Care (04/25/15 [id number])       Per Survivorship NP Mignon Pine guidance, called [facility]NT to arrange routine follow-up visit for patient.  Spoke with Michel Bickers, requested that patient be contacted and appt arranged to see Dr.Wolicki in July/August 2017.  She voiced understanding.  Gayleen Orem, RN, BSN, [place] at [place] [phone]                    Time Spent with Patient: 15 (04/25/15 0851)

## 2015-05-23 ENCOUNTER — Encounter (HOSPITAL_COMMUNITY): Payer: Self-pay

## 2015-05-23 ENCOUNTER — Telehealth: Payer: Self-pay | Admitting: Adult Health

## 2015-05-23 ENCOUNTER — Ambulatory Visit (HOSPITAL_COMMUNITY)
Admission: RE | Admit: 2015-05-23 | Discharge: 2015-05-23 | Disposition: A | Discharge status: Home / Self Care | Payer: Medicare Other | Source: Ambulatory Visit | Attending: Adult Health | Admitting: Adult Health

## 2015-05-23 DIAGNOSIS — C01 Malignant neoplasm of base of tongue: Secondary | ICD-10-CM | POA: Insufficient documentation

## 2015-05-23 DIAGNOSIS — I2510 Atherosclerotic heart disease of native coronary artery without angina pectoris: Secondary | ICD-10-CM | POA: Diagnosis not present

## 2015-05-23 DIAGNOSIS — Z122 Encounter for screening for malignant neoplasm of respiratory organs: Secondary | ICD-10-CM | POA: Diagnosis not present

## 2015-05-23 DIAGNOSIS — Z87891 Personal history of nicotine dependence: Secondary | ICD-10-CM | POA: Insufficient documentation

## 2015-05-23 NOTE — Telephone Encounter (Signed)
Attempted to reach patient to give him results of his recent CT chest screening for lung cancer.  Left a voicemail message asking for a return call.    Mike Craze, NP [place] [phone]

## 2015-05-24 ENCOUNTER — Telehealth: Payer: Self-pay | Admitting: *Deleted

## 2015-05-24 NOTE — Telephone Encounter (Signed)
  Oncology Nurse Navigator Documentation  Navigator Location: [facility]Med Onc (05/24/15 1220) Navigator Encounter Type: Telephone (05/24/15 1220) Telephone: Outgoing Call;Diagnostic Results (05/24/15 1220) Abnormal Finding Date: 03/03/14 (05/24/15 1220) Confirmed Diagnosis Date: 03/05/14 (05/24/15 1220)   Treatment Initiated Date: 04/06/14 (05/24/15 1220)   Treatment Phase: Post-Tx Follow-up (05/24/15 1220)         Per Survivorship NP Mike Craze, I called patient to inform him results of yesterday's chest CT were negative for lung cancer.  He expressed appreciation for the call.  Gayleen Orem, RN, BSN, [place] at [place] [phone]                        Time Spent with Patient: 15 (05/24/15 1220)

## 2015-06-23 ENCOUNTER — Other Ambulatory Visit: Payer: Self-pay | Admitting: Gastroenterology

## 2015-06-23 DIAGNOSIS — D126 Benign neoplasm of colon, unspecified: Secondary | ICD-10-CM | POA: Diagnosis not present

## 2015-06-23 DIAGNOSIS — K5730 Diverticulosis of large intestine without perforation or abscess without bleeding: Secondary | ICD-10-CM | POA: Diagnosis not present

## 2015-06-23 DIAGNOSIS — D122 Benign neoplasm of ascending colon: Secondary | ICD-10-CM | POA: Diagnosis not present

## 2015-06-23 DIAGNOSIS — K640 First degree hemorrhoids: Secondary | ICD-10-CM | POA: Diagnosis not present

## 2015-06-23 DIAGNOSIS — Z1211 Encounter for screening for malignant neoplasm of colon: Secondary | ICD-10-CM | POA: Diagnosis not present

## 2015-08-01 ENCOUNTER — Encounter: Payer: Self-pay | Admitting: Neurology

## 2015-08-01 ENCOUNTER — Ambulatory Visit (INDEPENDENT_AMBULATORY_CARE_PROVIDER_SITE_OTHER): Payer: Medicare Other | Admitting: Neurology

## 2015-08-01 VITALS — BP 120/84 | HR 106 | Ht 73.0 in | Wt 166.1 lb

## 2015-08-01 DIAGNOSIS — G3184 Mild cognitive impairment, so stated: Secondary | ICD-10-CM | POA: Diagnosis not present

## 2015-08-01 DIAGNOSIS — G621 Alcoholic polyneuropathy: Secondary | ICD-10-CM

## 2015-08-01 DIAGNOSIS — R634 Abnormal weight loss: Secondary | ICD-10-CM | POA: Diagnosis not present

## 2015-08-01 NOTE — Patient Instructions (Addendum)
Encouraged to eat well-balanced and nutritious meals three times daily Stop drinking alcohol Return to clinic in 6 months

## 2015-08-01 NOTE — Progress Notes (Signed)
Follow-up Visit   Date: 08/01/15    Dylan Moreno MRN: [id number] DOB: 10-Sep-1940   Interim History: Dylan Moreno is a 75 y.o. right-handed Caucasian male with history of hyperlipidemia, tobacco use, ADD, TIA (12/29/2012), squamous cell carcinoma s/p chemo/radiation (Spring 2016) returning to the clinic for complaints of memory impairment.  The patient was accompanied to the clinic by wife.  History of present illness: He presented to the Emergency Department on 12/29/2012 with acute onset of left facial droop, lasting one hour. He woke up that morning feeling dizzy and lightheaded. His son, who is a Agricultural consultant, noticed his facial droop and brought him to the ED for evaluation. He CT of the brain, MRI/A of the brain which did not show any acute stroke. There was a note of ventriculomegaly suggestive of NPH. US carotids showed patent ICAs and echocardiogram did not reveal an embolic source. He was previously taking lipitor 10mg  and was taking aspirin 81mg  daily. He was discharged on aspirin 325mg .  No prior personal or family history of stroke or TIA.   - Follow-up 05/04/2013:  At his last visit, I switched him to plavix.  48-hr holter testing did not show any arrhythmias.   He is tolerating it well and denies any new neurological symptoms. He is trying to cut back on smoking cigars.   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 He also underwent PEG tube placement. He was admitted to [facility] 5/45 - 06/03/2014 because of agitation, hallucinations, and delirium which had been ongoing for a week prior to presentation and thought to be due to combination of pain medications.  He also stopped taking Adderal one week prior to symptom onset  While hospitalized, his pain medications were slowly reduced and he started becoming  less agitated.  He was discharged to rehab facility on 6/2.  The following day, patient's family noted that his right pupil was larger so was taken to the emergency department where neurology evaluated him. Exam was notable for right pupil 6 mm and left pupil 3 mm, round, reactive to light and accommodation--no APD.  CTA did not demonstrate any vascular abnormalities.  Pupil asymmetry lasted about 2 days.  Over the past week, family has noticed a huge improvement and say that he is almost back to his baseline but continues to have memory problems.  He cannot keep up with reading as well and reports to reading 5 hours per day.  His son feels that he does not remember to focus on tasks such as what is taught in PT (how to stand/sit, etc).    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   UPDATE 09/30/2014:  He feels that he is doing well, but his wife and son feel that his memory is worse and balance is a problem.  He is doing out-patient therapy and is using a cane.  He has fallen 2-3 times, with superficial injuries.  He is not complaint with his home exercises and family feels this is partly the issue for his lack of improvement.  He does not remember appointments or tasks.  Patient is argumentative and disagrees with family's concerns.   UPDATE 01/31/2015:  He denies any new cognitive changes, their neuropsychological testing with Dr. Valentina Shaggy showed  neurocognitive disorder, unspecified, and ADHD.  He is scheduled to have a driving assessment tomorrow but is argumentative as to the reason why this needs to be done.  He also complains of spells of right leg trembling, especially after working out with his trainer.  Family denies any new cognitive issues.   He is having his PEG removed on Wednesday.   UPDATE 08/01/2015:  He reports doing well and has been seeing a trainer twice per week.  He continues to have problems with short-term memory, but overall is doing well.   He passed his driver's evaluation and has been driving locally.  His weight continues to be low which he attributes to poor appetite.  He drinks 2-3 cans of ensure daily.  His wife states that his diet choices are poor and include chips, cheese and cracks, and almonds.  He does not eat vegetables.  His also complains of imbalance, and denies falls.  He still drink 2-3 drinks daily.     Medications:  Current Outpatient Prescriptions on File Prior to Visit  Medication Sig Dispense Refill  . amphetamine-dextroamphetamine (ADDERALL) 20 MG tablet Take 20 mg by mouth daily. Reported on 12/22/2014    . atorvastatin (LIPITOR) 20 MG tablet Place 1 tablet (20 mg total) into feeding tube daily at 6 PM. (Patient taking differently: Take 20 mg by mouth daily at 6 PM. ) 30 tablet 0  . clopidogrel (PLAVIX) 75 MG tablet Place 1 tablet (75 mg total) into feeding tube daily. (Patient taking differently: Take 75 mg by mouth daily. ) 30 tablet 0  . levothyroxine (SYNTHROID, LEVOTHROID) 75 MCG tablet Place 1 tablet (75 mcg total) into feeding tube daily before breakfast. (Patient taking differently: Take 75 mcg by mouth daily before breakfast. ) 30 tablet 0  . sodium fluoride (PREVIDENT 5000 PLUS) 1.1 % CREA dental cream Apply to tooth brush. Brush teeth for 2 minutes. Spit out excess-DO NOT swallow. Repeat nightly. 1 Tube prn  . thiamine 100 MG tablet Take 100 mg by mouth daily.      No current facility-administered medications on file prior to visit.     Allergies: No Known Allergies   Review of Systems:  CONSTITUTIONAL: No fevers, chills, night sweats, or weight loss.   EYES: No visual changes or eye pain ENT: No hearing changes.  No history of nose bleeds.   RESPIRATORY: No cough, wheezing and shortness of breath.   CARDIOVASCULAR: Negative for chest pain, and palpitations.   GI: Negative for abdominal discomfort, blood in stools or black stools.  No recent change in bowel habits.   GU:  No history of  incontinence.   MUSCLOSKELETAL: No history of joint pain or swelling.  No myalgias.   SKIN: Negative for lesions, rash, and itching.   ENDOCRINE: Negative for cold or heat intolerance, polydipsia or goiter.   PSYCH:  No depression or anxiety symptoms.   NEURO: As Above.   Vital Signs:  BP 120/84   Pulse (!) 106   Ht 6\' 1"  (1.854 m)   Wt 166 lb 2 oz (75.4 kg)   SpO2 99%   BMI 21.92 kg/m   Neurological Exam: MENTAL STATUS:  Alert, awake, and easily engages in conversation.  Good eye contact.  Well groomed and dressed. Oriented x5. Speech is not dysarthric, but there is intermittent stuttering of speech (old).  Montreal Cognitive Assessment  09/30/2014 06/17/2014  Visuospatial/ Executive (0/5) 5 2  Naming (0/3) 3 2  Attention: Read list of digits (0/2) 2  2  Attention: Read list of letters (0/1) 1 1  Attention: Serial 7 subtraction starting at 100 (0/3) 3 1  Language: Repeat phrase (0/2) 2 2  Language : Fluency (0/1) 1 0  Abstraction (0/2) 1 1  Delayed Recall (0/5) 1 0  Orientation (0/6) 5 6  Total 24 17  Adjusted Score (based on education) 24 17    CRANIAL NERVES: Pupils round and reactive to light.  Normal conjugate, extra-ocular eye movements in all directions of gaze.  No ptosis. Face is symmetric. Palate elevates symmetrically.  Tongue is midline.  MOTOR:  Motor strength is 5/5 in all extremities.  MSRs:  Reflexes are 1+/4 in the upper extremities, absent reflexes in the lower extremities.   SENSORY:  Reduced vibration at the ankles.    COORDINATION/GAIT:  Gait slightly appears appears normal, mild unsteadiness with turning.  Unsteady with tandem gait.   Data: US carotids 12/30/2012: Findings suggest 1-39% internal carotid artery stenosis bilaterally. Vertebral arteries are patent with antegrade flow.   MRI/A brain 12/29/2012:  1. Ventricular prominence is disproportionate to the degree of atrophy, suggesting normal pressure hydrocephalus.  2. No other acute  intracranial abnormality. Specifically, there is no evidence for acute or subacute infarct.  3. Mild distal small vessel disease is evident on the MRA without significant proximal stenosis, aneurysm, or branch vessel occlusion.   48-hr holter:  NSR, rare PVCs  CT head 06/04/2014: Ventricular prominence similar to prior MR suggesting normal pressure hydrocephalus. No intracranial hemorrhage. No CT evidence of large acute infarct. No intracranial mass or abnormal enhancement.  CTA head and neck 06/04/2014:   Calcified plaque with mild to slightly moderate narrowing cavernous segment internal carotid artery bilaterally. Mild narrowing and irregularity M1 segment left middle cerebral artery. Mild narrowing distal left vertebral artery.  MRI lumbar spine wo contrast 09/30/2014: 1. Minimal left foraminal narrowing at L4-5 and L5-S1 secondary to leftward disc protrusions and facet hypertrophy. 2. Leftward disc protrusion at L3-4 without significant stenosis.  Neuropsychological testing 12/03/2014:  Unspecified mild neurocognitive disorder, Attention Deficit Hyperactivity disorder by history  Lab Results  Component Value Date   TSH 2.987 05/27/2014   Lab Results  Component Value Date   VITAMINB12 758 05/27/2014    IMPRESSION: 1.  Mild cognitive impairment, stable  - Neuropsychological testing performed by Dr. Valentina Shaggy shows mild neurocognitive disorder, ADHD  - CT head showing ventricular prominence, cannot exclude the likelihood of NPH, but gait appears improved so will continue to follow  - Repeat neuropsychological testing to look for interval changes at his next visit  2. Alcoholic peripheral neuropathy causing sensory ataxia, again, alcohol cessation strongly encouraged.  This is also contributing to empty calories and limiting his appetite  3. Weight loss due to poor appetite, discussed multiple strategies to increase his appetite and eat three well-balanced and nutritious meals daily.     3. 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  Return to clinic in 6 months  The duration of this appointment visit was 25 minutes of face-to-face time with the patient.  Greater than 50% of this time was spent in counseling, explanation of diagnosis, planning of further management, and coordination of care.   Thank you for allowing me to participate in patient's care.  If I can answer any additional questions, I would be pleased to do so.    Sincerely,    Venisha Boehning K. Posey Pronto, DO

## 2015-09-28 DIAGNOSIS — Z23 Encounter for immunization: Secondary | ICD-10-CM | POA: Diagnosis not present

## 2015-10-10 DIAGNOSIS — H9319 Tinnitus, unspecified ear: Secondary | ICD-10-CM | POA: Diagnosis not present

## 2015-10-10 DIAGNOSIS — C760 Malignant neoplasm of head, face and neck: Secondary | ICD-10-CM | POA: Diagnosis not present

## 2015-10-21 DIAGNOSIS — K591 Functional diarrhea: Secondary | ICD-10-CM | POA: Diagnosis not present

## 2015-10-21 DIAGNOSIS — K5730 Diverticulosis of large intestine without perforation or abscess without bleeding: Secondary | ICD-10-CM | POA: Diagnosis not present

## 2015-10-25 DIAGNOSIS — H903 Sensorineural hearing loss, bilateral: Secondary | ICD-10-CM | POA: Insufficient documentation

## 2015-10-25 DIAGNOSIS — H9113 Presbycusis, bilateral: Secondary | ICD-10-CM | POA: Insufficient documentation

## 2015-11-15 ENCOUNTER — Encounter: Payer: Self-pay | Admitting: Radiation Oncology

## 2015-11-15 NOTE — Progress Notes (Signed)
Dylan Moreno presents for follow up of radiation completed 05/25/14 to his Base of Tongue and bilateral neck  Pain issues, if any: No, he denies Using a feeding tube?: No, it has been removed.  Weight changes, if any:  Wt Readings from Last 3 Encounters:  11/18/15 164 lb 6.4 oz (74.6 kg)  08/01/15 166 lb 2 oz (75.4 kg)  04/22/15 165 lb 9.6 oz (75.1 kg)   Swallowing issues, if any: He denies any difficulty swallowing Smoking or chewing tobacco? No Using fluoride trays daily? No Last ENT visit was on: Dr. Erik Obey [id number] for hearing loss. Other notable issues, if any:  He reports frequent nocturia with urgency.   BP 127/71   Pulse 80   Temp 98 F (36.7 C)   Ht 6\' 1"  (1.854 m)   Wt 164 lb 6.4 oz (74.6 kg)   SpO2 98% Comment: room air  BMI 21.69 kg/m

## 2015-11-18 ENCOUNTER — Encounter: Payer: Self-pay | Admitting: Radiation Oncology

## 2015-11-18 ENCOUNTER — Ambulatory Visit
Admission: RE | Admit: 2015-11-18 | Discharge: 2015-11-18 | Disposition: A | Discharge status: Home / Self Care | Payer: Medicare Other | Source: Ambulatory Visit | Attending: Radiation Oncology | Admitting: Radiation Oncology

## 2015-11-18 VITALS — BP 127/71 | HR 80 | Temp 98.0°F | Ht 73.0 in | Wt 164.4 lb

## 2015-11-18 DIAGNOSIS — Z85810 Personal history of malignant neoplasm of tongue: Secondary | ICD-10-CM | POA: Diagnosis not present

## 2015-11-18 DIAGNOSIS — Z923 Personal history of irradiation: Secondary | ICD-10-CM | POA: Insufficient documentation

## 2015-11-18 DIAGNOSIS — C01 Malignant neoplasm of base of tongue: Secondary | ICD-10-CM | POA: Insufficient documentation

## 2015-11-18 DIAGNOSIS — Z08 Encounter for follow-up examination after completed treatment for malignant neoplasm: Secondary | ICD-10-CM | POA: Diagnosis not present

## 2015-11-18 HISTORY — DX: Personal history of irradiation: Z92.3

## 2015-11-18 MED ORDER — LARYNGOSCOPY SOLUTION RAD-ONC
15.0000 mL | Freq: Once | TOPICAL | Status: AC
  Administered 2015-11-18: 15 mL via TOPICAL
  Filled 2015-11-18: qty 15

## 2015-11-18 NOTE — Progress Notes (Addendum)
Radiation Oncology         [phone] ________________________________  Name: Dylan Moreno MRN: [id number]  Date: 11/18/2015  DOB: [birth year]  Follow-Up Visit Note  CC: Kandice Hams, MD  Jodi Marble, MD  Diagnosis and Prior Radiotherapy:       ICD-9-CM ICD-10-CM   1. Malignant neoplasm of base of tongue ([place]) 141.0 C01 laryngocopy solution for Rad-Onc     Fiberoptic laryngoscopy    CHIEF COMPLAINT:  Here for follow-up and surveillance of base of tongue and bilateral neck cancer  Narrative:  The patient returns today for routine follow-up of radiation completed 05/25/14.  He denies any pain issues. The patient reports his feeding tube has been removed. The patient denies any difficulty swallowing. He denies smoking or chewing tobacco. He reports he is not using fluoride trays daily. His last ENT visit was with Dr. Erik Obey on [id number] for hearing loss. The patient also reports frequent nocturia with urgency. The patient reports that he has difficulty reading since his treatment; his wife specifies that his memory is causing him this trouble.  Cognitive decline has been an issue for him for a while and he is following with an expert, undergoing testing/assessment.  No new neck masses, no new pain.  Stable weight    ALLERGIES:  has No Known Allergies.  Meds: Current Outpatient Prescriptions  Medication Sig Dispense Refill  . amphetamine-dextroamphetamine (ADDERALL) 20 MG tablet Take 20 mg by mouth daily. Reported on 12/22/2014    . atorvastatin (LIPITOR) 20 MG tablet Place 1 tablet (20 mg total) into feeding tube daily at 6 PM. (Patient taking differently: Take 20 mg by mouth daily at 6 PM. ) 30 tablet 0  . clopidogrel (PLAVIX) 75 MG tablet Place 1 tablet (75 mg total) into feeding tube daily. (Patient taking differently: Take 75 mg by mouth daily. ) 30 tablet 0  . GAVILYTE-N WITH FLAVOR PACK 420 g solution See admin instructions.  0  . levothyroxine (SYNTHROID,  LEVOTHROID) 75 MCG tablet Place 1 tablet (75 mcg total) into feeding tube daily before breakfast. (Patient taking differently: Take 75 mcg by mouth daily before breakfast. ) 30 tablet 0  . thiamine 100 MG tablet Take 100 mg by mouth daily.     . [employer] BISACODYL 5 MG EC tablet See admin instructions.  0  . sodium fluoride (PREVIDENT 5000 PLUS) 1.1 % CREA dental cream Apply to tooth brush. Brush teeth for 2 minutes. Spit out excess-DO NOT swallow. Repeat nightly. (Patient not taking: Reported on 11/18/2015) 1 Tube prn   No current facility-administered medications for this encounter.     Physical Findings: The patient is in no acute distress. Patient is alert and oriented. Wt Readings from Last 3 Encounters:  11/18/15 164 lb 6.4 oz (74.6 kg)  08/01/15 166 lb 2 oz (75.4 kg)  04/22/15 165 lb 9.6 oz (75.1 kg)    height is 6\' 1"  (1.854 m) and weight is 164 lb 6.4 oz (74.6 kg). His temperature is 98 F (36.7 C). His blood pressure is 127/71 and his pulse is 80. His oxygen saturation is 98%. .  General: Alert , in no acute distress HEENT: Head is normocephalic. Oropharynx and oral cavity are clear. Neck: Residual mild lymphedema in the anterior neck. No palpable supraclavicular masses. Skin: Skin is intact and smooth over the neck. Heart: Regular in rate and rhythm with no murmurs, rubs, or gallops. Chest: Clear to auscultation bilaterally, with no rhonchi, wheezes, or rales.  Abdomen: Soft, nontender,  nondistended, with no rigidity or guarding.  Extremities: No edema. Musculoskeletal: Strength is 5/5 and symmetric in all of his extremities. Lymphatics: see Neck Exam Psychiatric: limited insight. Affect is appropriate. NEURO: no obvious focalities.   PROCEDURE NOTE: After obtaining consent and anesthetizing the nasal cavity with topical lidocaine and phenylephrine, the flexible endoscope was introduced and passed through the nasal cavity. No pharyngeal or laryngeal tumors appreciated. The cords  are symmetrically mobile. NED   Lab Findings: Lab Results  Component Value Date   WBC 5.3 02/02/2015   HGB 15.2 02/02/2015   HCT 44.5 02/02/2015   MCV 90.1 02/02/2015   PLT 224 02/02/2015    Lab Results  Component Value Date   TSH 2.987 05/27/2014    Radiographic Findings: No results found.  Impression/Plan:    1) Head and Neck Cancer Status: There is no evidence of recurrence.   2) Nutritional Status: No concerns at this time PEG tube: No  3) Risk Factors: The patient has been educated about risk factors including alcohol and tobacco abuse; they understand that avoidance of alcohol and tobacco is important to prevent recurrences as well as other cancers. Abstains from tobacco, still drinks 2 cocktails daily.  Advised him to taper down but he doesn't seem motivated.  4) Swallowing: No difficulty reported  5) Dental: Encouraged to continue regular followup with dentistry, and dental hygiene including fluoride rinses. I encouraged the patient to use his daily fluoride trays. The patient's wife reports his dentist provided Prevident 5000, but the patient does not use it.  6) Thyroid function:  Lab Results  Component Value Date   TSH 2.987 05/27/2014    7) Other: Memory Loss. Patient has an upcoming appointment in February 2018 with a Cognitive Specialist and Dr. Posey Pronto to reevaluate. I recommend that he request more information about neurocognitive rehabilitation at this appointment.  Additionally, per the patient's concern about his nocturia, I recommend he bring this up with his PCP.   8)  I would like the patient to follow up with Mike Craze in 6 months (he will not need laryngoscopy outside otolaryngology w/ his ENT surgeon unless new symptoms of concern arise), and then once a year after that. The patient was encouraged to call with any issues or questions before then. I will see him back PRN.  His wife will arrange f/u with ENT.  I spent 30 minutes face to face  with the patient and more than 50% of that time was spent in counseling and/or coordination of care. _____________________________________   Eppie Gibson, MD  This document serves as a record of services personally performed by Eppie Gibson, MD. It was created on her behalf by Maryla Morrow, a trained medical scribe. The creation of this record is based on the scribe's personal observations and the provider's statements to them. This document has been checked and approved by the attending provider.

## 2015-11-21 ENCOUNTER — Telehealth: Payer: Self-pay | Admitting: *Deleted

## 2015-11-21 NOTE — Telephone Encounter (Signed)
CALLED PATIENT TO INFORM OF [facility] APPT. ON 05-18-16 @ 3 PM, LVM FOR A RETURN CALL

## 2016-02-06 ENCOUNTER — Encounter: Payer: Self-pay | Admitting: Neurology

## 2016-02-06 ENCOUNTER — Ambulatory Visit (INDEPENDENT_AMBULATORY_CARE_PROVIDER_SITE_OTHER): Payer: Medicare Other | Admitting: Neurology

## 2016-02-06 VITALS — BP 128/80 | HR 74 | Ht 73.0 in | Wt 167.1 lb

## 2016-02-06 DIAGNOSIS — G3184 Mild cognitive impairment, so stated: Secondary | ICD-10-CM

## 2016-02-06 DIAGNOSIS — R29898 Other symptoms and signs involving the musculoskeletal system: Secondary | ICD-10-CM | POA: Diagnosis not present

## 2016-02-06 DIAGNOSIS — G621 Alcoholic polyneuropathy: Secondary | ICD-10-CM

## 2016-02-06 DIAGNOSIS — R269 Unspecified abnormalities of gait and mobility: Secondary | ICD-10-CM | POA: Diagnosis not present

## 2016-02-06 NOTE — Progress Notes (Signed)
Follow-up Visit   Date: 02/06/16    Dylan Moreno MRN: [id number] DOB: [birth year]   Interim History: Dylan Moreno is a 76 y.o. right-handed Caucasian male with history of hyperlipidemia, tobacco use, ADD, TIA (12/29/2012), squamous cell carcinoma s/p chemo/radiation (Spring 2016) returning to the clinic for complaints of memory impairment.  The patient was accompanied to the clinic by wife.  History of present illness: He presented to the Emergency Department on 12/29/2012 with acute onset of left facial droop, lasting one hour. He woke up that morning feeling dizzy and lightheaded. His son, who is a Agricultural consultant, noticed his facial droop and brought him to the ED for evaluation. He CT of the brain, MRI/A of the brain which did not show any acute stroke. There was a note of ventriculomegaly suggestive of NPH. US carotids showed patent ICAs and echocardiogram did not reveal an embolic source. He was previously taking lipitor 10mg  and was taking aspirin 81mg  daily. He was discharged on aspirin 325mg .  No prior personal or family history of stroke or TIA.   - Follow-up 05/04/2013:  At his last visit, I switched him to plavix.  48-hr holter testing did not show any arrhythmias.   He is tolerating it well and denies any new neurological symptoms. He is trying to cut back on smoking cigars.   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 He also underwent PEG tube placement. He was admitted to [facility] 5/45 - 06/03/2014 because of agitation, hallucinations, and delirium which had been ongoing for a week prior to presentation and thought to be due to combination of pain medications.  He also stopped taking Adderal one week prior to symptom onset  While hospitalized, his pain medications were slowly reduced and he started becoming  less agitated.  He was discharged to rehab facility on 6/2.  The following day, patient's family noted that his right pupil was larger so was taken to the emergency department where neurology evaluated him. Exam was notable for right pupil 6 mm and left pupil 3 mm, round, reactive to light and accommodation--no APD.  CTA did not demonstrate any vascular abnormalities.  Pupil asymmetry lasted about 2 days.  Over the past week, family has noticed a huge improvement and say that he is almost back to his baseline but continues to have memory problems.  He cannot keep up with reading as well and reports to reading 5 hours per day.  His son feels that he does not remember to focus on tasks such as what is taught in PT (how to stand/sit, etc).    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   UPDATE 09/30/2014:  He feels that he is doing well, but his wife and son feel that his memory is worse and balance is a problem.  He is doing out-patient therapy and is using a cane.  He has fallen 2-3 times, with superficial injuries.  He is not complaint with his home exercises and family feels this is partly the issue for his lack of improvement.  He does not remember appointments or tasks.  Patient is argumentative and disagrees with family's concerns.   UPDATE 01/31/2015:  He denies any new cognitive changes, their neuropsychological testing with Dr. Valentina Shaggy showed  neurocognitive disorder, unspecified, and ADHD.  He is scheduled to have a driving assessment tomorrow but is argumentative as to the reason why this needs to be done.  He also complains of spells of right leg trembling, especially after working out with his trainer.  Family denies any new cognitive issues.   He is having his PEG removed on Wednesday.   UPDATE 08/01/2015:  He reports doing well and has been seeing a trainer twice per week.  He continues to have problems with short-term memory, but overall is doing well.   He passed his driver's evaluation and has been driving locally.  His weight continues to be low which he attributes to poor appetite.  He drinks 2-3 cans of ensure daily.  His wife states that his diet choices are poor and include chips, cheese and cracks, and almonds.  He does not eat vegetables.  His also complains of imbalance, and denies falls.  He still drink 2-3 drinks daily.    UPDATE 02/06/2016:  Patient is here with his wife for 6 month follow-up.  Since his last visit, his wife has noticed greater difficulty using his phone and remote control, such as remembering how to record things or use applications. He quickly forgets short-term events/conversations, such as what he had for dinner.  There has been no hallucinations or changes in behavior.  He tends to be irritable at night, which he wife attributes to his drinking.  Despite my recommendation to abstain from alcohol, he continues to consume 2-3 drinks nightly.  He has difficulty reading a novels, but is still able to keep up with newspaper. He reports to having one fall due to a rug being on the floor and wife feels that he drags his left leg sometimes.  He is walking unassisted.  He denies any incontinence.     Medications:  Current Outpatient Prescriptions on File Prior to Visit  Medication Sig Dispense Refill  . amphetamine-dextroamphetamine (ADDERALL) 20 MG tablet Take 20 mg by mouth daily. Reported on 12/22/2014    . atorvastatin (LIPITOR) 20 MG tablet Place 1 tablet (20 mg total) into feeding tube daily at 6 PM. (Patient taking differently: Take 20 mg by mouth daily at 6 PM. ) 30 tablet 0  . clopidogrel (PLAVIX) 75 MG tablet Place 1 tablet (75 mg total) into feeding tube daily. (Patient taking differently: Take 75 mg by mouth daily. ) 30 tablet 0  . [facility]S BISACODYL 5 MG EC tablet See admin instructions.  0  . GAVILYTE-N WITH FLAVOR PACK 420 g solution See admin instructions.  0  . levothyroxine (SYNTHROID, LEVOTHROID) 75 MCG tablet Place  1 tablet (75 mcg total) into feeding tube daily before breakfast. (Patient taking differently: Take 75 mcg by mouth daily before breakfast. ) 30 tablet 0  . sodium fluoride (PREVIDENT 5000 PLUS) 1.1 % CREA dental cream Apply to tooth brush. Brush teeth for 2 minutes. Spit out excess-DO NOT swallow. Repeat nightly. 1 Tube prn  . thiamine 100 MG tablet Take 100 mg by mouth daily.      No current facility-administered medications on file prior to visit.     Allergies: No Known Allergies   Review of Systems:  CONSTITUTIONAL: No fevers, chills, night sweats, or weight loss.   EYES: No visual changes or eye pain ENT: No hearing changes.  No history of nose bleeds.   RESPIRATORY: No cough, wheezing and shortness of breath.   CARDIOVASCULAR: Negative for chest pain, and palpitations.   GI: Negative  for abdominal discomfort, blood in stools or black stools.  No recent change in bowel habits.   GU:  No history of incontinence.   MUSCLOSKELETAL: No history of joint pain or swelling.  No myalgias.   SKIN: Negative for lesions, rash, and itching.   ENDOCRINE: Negative for cold or heat intolerance, polydipsia or goiter.   PSYCH:  No depression or anxiety symptoms.   NEURO: As Above.   Vital Signs:  BP 128/80   Pulse 74   Ht 6\' 1"  (1.854 m)   Wt 167 lb 1 oz (75.8 kg)   SpO2 97%   BMI 22.04 kg/m   Neurological Exam: MENTAL STATUS:  Alert, awake, and easily engages in conversation. Well groomed and dressed. Oriented x5. Speech is not dysarthric, but there is intermittent stuttering of speech (old) and he has scattered thought process and easily distractible.  Montreal Cognitive Assessment  09/30/2014 06/17/2014  Visuospatial/ Executive (0/5) 5 2  Naming (0/3) 3 2  Attention: Read list of digits (0/2) 2 2  Attention: Read list of letters (0/1) 1 1  Attention: Serial 7 subtraction starting at 100 (0/3) 3 1  Language: Repeat phrase (0/2) 2 2  Language : Fluency (0/1) 1 0  Abstraction (0/2) 1 1   Delayed Recall (0/5) 1 0  Orientation (0/6) 5 6  Total 24 17  Adjusted Score (based on education) 24 17    CRANIAL NERVES: Pupils round and reactive to light.  Normal conjugate, extra-ocular eye movements in all directions of gaze.  No ptosis. Face is symmetric. Palate elevates symmetrically.  Tongue is midline.  MOTOR:  Motor strength is 5/5 in all extremities, except 4+/5 left toe extensors and 5-/5 left dorsiflexion.  MSRs:  Reflexes are 1+/4 in the upper extremities, absent reflexes in the lower extremities.   SENSORY:  Reduced vibration at the ankles.    COORDINATION/GAIT:  Gait show mild dragging of the left leg, there is no steppage, unassisted.  He has mild unsteadiness with turning.  Unsteady with tandem gait.   Data: US carotids 12/30/2012: Findings suggest 1-39% internal carotid artery stenosis bilaterally. Vertebral arteries are patent with antegrade flow.   MRI/A brain 12/29/2012:  1. Ventricular prominence is disproportionate to the degree of atrophy, suggesting normal pressure hydrocephalus.  2. No other acute intracranial abnormality. Specifically, there is no evidence for acute or subacute infarct.  3. Mild distal small vessel disease is evident on the MRA without significant proximal stenosis, aneurysm, or branch vessel occlusion.   48-hr holter:  NSR, rare PVCs  CT head 06/04/2014: Ventricular prominence similar to prior MR suggesting normal pressure hydrocephalus. No intracranial hemorrhage. No CT evidence of large acute infarct. No intracranial mass or abnormal enhancement.  CTA head and neck 06/04/2014:   Calcified plaque with mild to slightly moderate narrowing cavernous segment internal carotid artery bilaterally. Mild narrowing and irregularity M1 segment left middle cerebral artery. Mild narrowing distal left vertebral artery.  MRI lumbar spine wo contrast 09/30/2014: 1. Minimal left foraminal narrowing at L4-5 and L5-S1 secondary to leftward disc  protrusions and facet hypertrophy. 2. Leftward disc protrusion at L3-4 without significant stenosis.  Neuropsychological testing 12/03/2014:  Unspecified mild neurocognitive disorder, Attention Deficit Hyperactivity disorder by history  Lab Results  Component Value Date   TSH 2.987 05/27/2014   Lab Results  Component Value Date   VITAMINB12 758 05/27/2014    IMPRESSION: 1.  Mild cognitive impairment, worsening.  He has difficulty with complex instructions/tasks and tends to be very distractible.  -  Neuropsychological testing performed by Dr. Valentina Shaggy shows mild neurocognitive disorder, ADHD; however, I will be repeating this assessment as I feel that he has more cognitive deficits than MCI alone.  Certainly, his ADHD is playing a role.  - CT head showing ventricular prominence, cannot exclude the likelihood of NPH, but gait appears stable and there is no incontinence, so will continue to follow  - If there is marked change in his neurocognitive testing, it would be prudent to repeat MRI brain  2. Alcoholic peripheral neuropathy causing sensory ataxia, again, alcohol cessation strongly encouraged.  He is dragging his left foot slightly and there is distal weakness of the feet, which I suspect is due to neuropathy and less likely L5 radiculopathy. He is agreeable to NCS/EMG of the legs to evaluate this further.  Start physical therapy for balance and leg strengthening.    3. 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  Return to clinic in 3-4 months  The duration of this appointment visit was 25 minutes of face-to-face time with the patient.  Greater than 50% of this time was spent in counseling, explanation of diagnosis, planning of further management, and coordination of care.   Thank you for allowing me to participate in patient's care.  If I can answer any additional questions, I would be  pleased to do so.    Sincerely,    Donika K. Posey Pronto, DO

## 2016-02-06 NOTE — Patient Instructions (Addendum)
1.  Start physical therapy for leg strengthening 2.  NCS/EMG of the left > right leg  Return to clinic in 3-4 months

## 2016-02-07 ENCOUNTER — Ambulatory Visit (INDEPENDENT_AMBULATORY_CARE_PROVIDER_SITE_OTHER): Payer: Medicare Other | Admitting: Neurology

## 2016-02-07 ENCOUNTER — Encounter: Payer: Self-pay | Admitting: Psychology

## 2016-02-07 ENCOUNTER — Ambulatory Visit (INDEPENDENT_AMBULATORY_CARE_PROVIDER_SITE_OTHER): Payer: Medicare Other | Admitting: Psychology

## 2016-02-07 DIAGNOSIS — G621 Alcoholic polyneuropathy: Secondary | ICD-10-CM

## 2016-02-07 DIAGNOSIS — R413 Other amnesia: Secondary | ICD-10-CM | POA: Diagnosis not present

## 2016-02-07 DIAGNOSIS — G3184 Mild cognitive impairment, so stated: Secondary | ICD-10-CM

## 2016-02-07 NOTE — Progress Notes (Signed)
NEUROPSYCHOLOGICAL INTERVIEW (CPT: [id number])  Name: Dylan Moreno Date of Birth: [birth year] Date of Interview: 02/07/2016  Reason for Referral:  Dylan Moreno is a 76 y.o. male who is referred for neuropsychological re-evaluation by Dr. Narda Amber of [facility] due to concerns about worsening memory loss. This patient is accompanied in the office by his wife who supplements the history.  History of Presenting Problem:  Dylan Moreno has a lifelong history of ADHD symptoms and was formally diagnosed by a psychiatrist about 15 years ago. He has been treated with Adderall since that time. He also has a history of daily alcohol use for about 30 years. 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 He saw Dr. Posey Pronto for neurologic consultation after hospital admission in 12/2012 and has been followed by her since that time. 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 He completed radiation and chemotherapy in May 2016. He was admitted to [facility] hospital in May 2016 for delirium with hallucinations and agitation. While hospitalized, his pain medications (fentanyl patch and morphine) were slowly reduced; his agitation abated and his mental status cleared. He was discharged to a rehabilitation facility on 06/03/14. He was evaluated by neuropsychologist Dr. Valentina Shaggy in 11/2014. Testing was reportedly consistent with ADHD and mild cognitive impairment. His wife stated at that time that he had displayed problems with memory prior to 2016 which then became much worse by the end of his radiation treatment in May 2016. He has not displayed any confusion or unusual behavior since his delirium abated in 2016. His most recent appointment with Dr. Posey Pronto was 02/06/2016; his wife reported increasing short term memory loss at that time.  Over the past year, since his last neuropsychological evaluation, the patient reports that his  memory seems to be getting worse. She noted that he went to the wrong office for his appointment today (they drove separately) even though they had just been here yesterday and they discussed it right before he left. She also noted that he is struggling more with using his smartphone and television remote.  The patient reports that the only significant change in the past year is that he no longer reads novels. He used to read novels frequently. He still reads the newspaper daily and denies any problems with this, but he is not motivated to read books. He has not tried to read a book lately so he does not know if he would have more trouble sustaining attention, comprehending, etc.  The patient denies having any concerns about cognitive functioning at the present time, but his wife notes that he told her the other day that he was concerned about his memory, and this concern was bothering him. He does not recall that he told her this.  Upon direct questioning, the patient's wife reported the following with regard to current cognitive functioning:  Forgetting recent conversations/events: yes Repeating statements/questions: Yes, "constantly" Misplacing/losing items: yes  Forgetting appointments or other obligations: Needing to be reminded more per wife.  Forgetting to take medications: May put them someplace and then forget where he put them. He uses a pillbox. Difficulty concentrating: ADHD sx have gotten worse to some extent, depends of time of day. Morning is best. Evening is worst.  Word-finding difficulty: Maybe a little more Comprehension difficulty: No problem as a general rule but depends on topic (may have more trouble with increased complexity) Getting lost when driving: No. His  wife has not ridden with him; when they go out she usually drives. He's not driving much; he goes to his office and a couple of other places. The patient adamantly denies any difficulties with driving.  The patient does  not manage the finances or do any cooking.  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 He does go to his office and talk to people. He notes, "I'm glad that this business is running without me." When asked, though, he states he does think that cognitively he could do the job if he needed to.  Physically, the patient complains of reduced energy. In the past he has always had a high level of energy. He does not take naps. He goes to bed early and wakes up early. He has no difficulty sleeping.  He has had a few falls in the past year. He admits that he feels off balance sometimes. He notes he has always done things very fast and it is hard for him to learn to slow down. He tripped on a rug a few days ago. He also fell in the yard when he was blowing leaves. He does not think he has fallen in the context of alcohol use, but his wife reminds him that he did fall one evening, after/while drinking, onto the coffee table.  The patient denies any change in mood. He denies depressed mood, lack of interest, irritability or anxiety. He thinks he may actually "have more patience". His wife reports the only change is that he seems to be more "upset" in the evening when he is drinking alcohol.  His appetite is reduced since cancer treatments but he says it is "picking up". He and his wife note that he eats a lot of salty foods. He eats multiple servings of potato chips and almonds in one setting.  He exercises at [facility] and is working with a Clinical research associate there.   Social History: Education: He reported history of attentional difficulties since early childhood and throughout school. He denied ever having been retained in grade. He graduated from high school. He attended one year of college but dropped out due to lack of interest. He served in [employer] for six years in the 1960s. 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 He continues to drink alcohol nightly. He reportedly drinks a total of 4-5 oz of liquor (usually gin or scotch) a night. His wife also mentions that he frequently drinks sparkling wine in addition to this though not every night. He does not drink during the day. He denied recreational drug use.   Medical History: Past Medical History:  Diagnosis Date  . ADHD (attention deficit hyperactivity disorder)   . Anxiety   . Arthritis   . Cancer of base of tongue ([place]) 03/05/14   squamous cell carcinoma  . Dysuria 07/15/2014  . GERD (gastroesophageal reflux disease)   . History of radiation therapy 04/06/14- 05/25/14   Base of tongue and bilateral neck 70 Gy in 35 fractions to gross disease, 63 Gy in 35 Fractions to high risk nodal echelons, and 56 Gy in 35 fractions to intermediate risk nodal echelons  . Hypercholesterolemia   . Hyperlipemia   .  Hypertension   . Kidney stones   . Peyronie's disease   . TIA (transient ischemic attack)   . TIA (transient ischemic attack) 12/29/12   left facial droop  . Wears glasses      Current Medications:  Outpatient Encounter Prescriptions as of 02/07/2016  Medication Sig  . amphetamine-dextroamphetamine (ADDERALL) 20 MG tablet Take 20 mg by mouth daily. Reported on 12/22/2014  . atorvastatin (LIPITOR) 20 MG tablet Place 1 tablet (20 mg total) into feeding tube daily at 6 PM. (Patient taking differently: Take 20 mg by mouth daily at 6 PM. )  . clopidogrel (PLAVIX) 75 MG tablet Place 1 tablet (75 mg total) into feeding tube daily. (Patient taking differently: Take 75 mg by mouth daily. )  . [facility]S BISACODYL 5 MG EC tablet See admin instructions.  Marland Kitchen GAVILYTE-N WITH FLAVOR PACK 420 g  solution See admin instructions.  Marland Kitchen levothyroxine (SYNTHROID, LEVOTHROID) 75 MCG tablet Place 1 tablet (75 mcg total) into feeding tube daily before breakfast. (Patient taking differently: Take 75 mcg by mouth daily before breakfast. )  . sodium fluoride (PREVIDENT 5000 PLUS) 1.1 % CREA dental cream Apply to tooth brush. Brush teeth for 2 minutes. Spit out excess-DO NOT swallow. Repeat nightly.  . thiamine 100 MG tablet Take 100 mg by mouth daily.    No facility-administered encounter medications on file as of 02/07/2016.      Behavioral Observations:   Appearance: Neatly and appropriately dressed and groomed Gait: Ambulated independently, no abnormalities observed Speech: Fluent; normal rate, rhythm and volume. Repeats himself on occasion. Thought process: Linear, goal directed Affect: Full, euthymic Interpersonal: Pleasant, appropriate Demonstrates short term memory deficits -- e.g., seems to have no memory of EMG being scheduled for today and does not know what the procedure is although it was discussed with Dr. Posey Pronto yesterday.   TESTING: There is medical necessity to proceed with neuropsychological assessment as the results will be used to aid in differential diagnosis and clinical decision-making and to inform specific treatment recommendations. The patient was diagnosed with MCI in 2016. Per the patient, his wife and medical records reviewed, there has been progressive memory loss and a reasonable suspicion of conversion of MCI to dementia.   PLAN: The patient will return for a full battery of neuropsychological testing with a psychometrician under my supervision. Education regarding testing procedures was provided. Subsequently, the patient will see this provider for a follow-up session at which time his test performances and my impressions and treatment recommendations will be reviewed in detail.   Full neuropsychological evaluation report to follow.

## 2016-02-07 NOTE — Procedures (Addendum)
[facility]  [place]  [place], [place] [address] Tel: [phone] Fax:  [phone] Test Date:  02/07/2016  Patient: Dylan Moreno DOB: Oct 24, 1940 Physician: Narda Amber, DO  Sex: Male Height: 6\' 1"  Ref Phys: Narda Amber, DO  ID#: [id number] Temp: 33.7C Technician: Jerilynn Mages. Dean   Patient Complaints: This is a 76 year old gentleman referred for evaluation of left foot weakness.   NCV & EMG Findings: Extensive electrodiagnostic testing of the left lower extremity shows:  1. Left sural and superficial peroneal sensory responses are within normal limits. 2. Left peroneal and tibial motor responses are within normal limits. 3. When corrected for patient's height, bilateral tibial H reflex studies are within normal limits.  4. There is no evidence of active or chronic motor axon loss changes affecting any of the tested muscles. Motor unit configuration and recruitment pattern is within normal limits.   Impression: This is a normal study of the left lower extremity. In particular, there is no evidence of the large fiber sensorimotor polyneuropathy or lumbosacral radiculopathy.  However, small fiber neuropathy cannot be excluded by this study.   ___________________________ Narda Amber, DO    Nerve Conduction Studies Anti Sensory Summary Table   Site NR Peak (ms) Norm Peak (ms) P-T Amp (V) Norm P-T Amp  Left Sup Peroneal Anti Sensory (Ant Lat Mall)  33.7C  12 cm    3.4 <4.6 3.9 >3  Left Sural Anti Sensory (Lat Mall)  33.7C  Calf    4.2 <4.6 5.7 >3   Motor Summary Table   Site NR Onset (ms) Norm Onset (ms) O-P Amp (mV) Norm O-P Amp Site1 Site2 Delta-0 (ms) Dist (cm) Vel (m/s) Norm Vel (m/s)  Left Peroneal Motor (Ext Dig Brev)  33.7C  Ankle    3.5 <6.0 3.2 >2.5 B Fib Ankle 8.9 37.0 42 >40  B Fib    12.4  3.2  Poplt B Fib 2.4 10.0 42 >40  Poplt    14.8  3.2         Left Tibial Motor (Abd Hall Brev)  33.7C  Ankle    4.1 <6.0 4.8 >4 Knee Ankle 10.5  44.0 42 >40  Knee    14.6  3.1          H Reflex Studies   NR H-Lat (ms) Lat Norm (ms) L-R H-Lat (ms) M-Lat (ms) HLat-MLat (ms)  Left Tibial (Gastroc)  33.7C     37.55 <35 1.99 5.58 31.97  Right Tibial (Gastroc)  33.7C     39.54 <35 1.99 5.58 33.96   EMG   Side Muscle Ins Act Fibs Psw Fasc Number Recrt Dur Dur. Amp Amp. Poly Poly. Comment  Left AntTibialis Nml Nml Nml Nml Nml Nml Nml Nml Nml Nml Nml Nml N/A  Left Gastroc Nml Nml Nml Nml Nml Nml Nml Nml Nml Nml Nml Nml N/A  Left Flex Dig Long Nml Nml Nml Nml Nml Nml Nml Nml Nml Nml Nml Nml N/A  Left RectFemoris Nml Nml Nml Nml Nml Nml Nml Nml Nml Nml Nml Nml N/A  Left GluteusMed Nml Nml Nml Nml Nml Nml Nml Nml Nml Nml Nml Nml N/A  Left BicepsFemS Nml Nml Nml Nml Nml Nml Nml Nml Nml Nml Nml Nml N/A      Waveforms:

## 2016-02-08 ENCOUNTER — Ambulatory Visit (INDEPENDENT_AMBULATORY_CARE_PROVIDER_SITE_OTHER): Payer: Medicare Other | Admitting: Psychology

## 2016-02-08 DIAGNOSIS — R4689 Other symptoms and signs involving appearance and behavior: Secondary | ICD-10-CM | POA: Diagnosis not present

## 2016-02-08 DIAGNOSIS — R4189 Other symptoms and signs involving cognitive functions and awareness: Secondary | ICD-10-CM

## 2016-02-08 DIAGNOSIS — G3184 Mild cognitive impairment, so stated: Secondary | ICD-10-CM

## 2016-02-08 DIAGNOSIS — R413 Other amnesia: Secondary | ICD-10-CM

## 2016-02-08 NOTE — Progress Notes (Signed)
   Neuropsychology Note  Dylan Moreno returned today for 2 hours of neuropsychological testing with technician, Milana Kidney, BS, under the supervision of Dr. Macarthur Critchley. The patient did not appear overtly distressed by the testing session, per behavioral observation or via self-report to the technician. Rest breaks were offered. Dylan Moreno will return within 2 weeks for a feedback session with Dr. Si Raider at which time his test performances, clinical impressions and treatment recommendations will be reviewed in detail. The patient understands he can contact our office should he require our assistance before this time.  Full report to follow.

## 2016-02-15 NOTE — Progress Notes (Signed)
NEUROPSYCHOLOGICAL EVALUATION   Name:    Dylan Moreno  Date of Birth:   [birth year] Date of Interview:  02/07/2016 Date of Testing:  02/08/2016   Date of Feedback:  02/16/2016       Background Information:  Reason for Referral:  Dylan Moreno is a 76 y.o. male referred by Dr. Narda Amber to assess his current level of cognitive functioning and assist in differential diagnosis. The current evaluation consisted of a review of available medical records, an interview with the patient and his wife, and the completion of a neuropsychological testing battery. Informed consent was obtained.  History of Presenting Problem:  Dylan Moreno has a lifelong history of ADHD symptoms and was formally diagnosed by a psychiatrist about 15 years ago. He has been treated with Adderall since that time. He also has a history of daily alcohol use for about 30 years. 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 He saw Dr. Posey Pronto for neurologic consultation after hospital admission in 12/2012 and has been followed by her since that time. 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 He completed radiation and chemotherapy in May 2016. He was admitted to [facility] hospital in May 2016 for delirium with hallucinations and agitation. While hospitalized, his pain medications (fentanyl patch and morphine) were slowly reduced; his agitation abated and his mental status cleared. He was discharged to a rehabilitation facility on 06/03/14. He was evaluated by neuropsychologist Dr. Valentina Shaggy in 11/2014. Testing was reportedly consistent with ADHD and mild cognitive impairment. His wife stated at that time that he had displayed problems with memory prior to 2016 which then became much worse by the end of his radiation treatment in May 2016. He has not displayed any confusion or unusual behavior since his delirium abated in 2016. His most recent  appointment with Dr. Posey Pronto was 02/06/2016; his wife reported increasing short term memory loss at that time.  Over the past year, since his last neuropsychological evaluation, the patient reports that his memory seems to be getting worse. His wife noted that he went to the wrong office for his appointment today (they drove separately) even though they had just been here yesterday and they discussed it right before he left. She also noted that he is struggling more with using his smartphone and television remote.  The patient reports that the only significant change in the past year is that he no longer reads novels. He used to read novels frequently. He still reads the newspaper daily and denies any problems with this, but he is not motivated to read books. He has not tried to read a book lately so he does not know if he would have more trouble sustaining attention, comprehending, etc.  The patient denies having any concerns about cognitive functioning at the present time, but his wife notes that he told her the other day that he was concerned about his memory, and this concern was bothering him. He does not recall that he told her this. During the testing session with my technician, he reported that he was concerned about his memory which he feels has been failing since he underwent radiation.  Upon direct questioning, the patient's wife reported the following with regard to current cognitive functioning:  Forgetting recent conversations/events: yes Repeating statements/questions: Yes, "constantly" Misplacing/losing items: yes  Forgetting appointments or other obligations: Needing to be reminded more per wife.  Forgetting to take  medications: May put them someplace and then forget where he put them. He uses a pillbox. Difficulty concentrating: ADHD sx have gotten worse to some extent, depends of time of day. Morning is best. Evening is worst.  Word-finding difficulty: Maybe a little  more Comprehension difficulty: No problem as a general rule but depends on topic (may have more trouble with increased complexity) Getting lost when driving: No. His wife has not ridden with him; when they go out she usually drives. He's not driving much; he goes to his office and a couple of other places. The patient adamantly denies any difficulties with driving.  The patient does not manage the finances or do any cooking.  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 He does go to his office and talk to people. He notes, "I'm glad that this business is running without me." When asked, though, he states he does think that cognitively he could do the job if he needed to.  Physically, the patient complains of reduced energy. In the past he has always had a high level of energy. He does not take naps. He goes to bed early and wakes up early. He has no difficulty sleeping.  He has had a few falls in the past year. He admits that he feels off balance sometimes. He notes he has always done things very fast and it is hard for him to learn to slow down. He tripped on a rug a few days ago. He also fell in the yard when he was blowing leaves. He does not think he has fallen in the context of alcohol use, but his wife reminds him that he did fall one evening, after/while drinking, onto the coffee table.  The patient denies any change in mood. He denies depressed mood, lack of interest, irritability or anxiety. He thinks he may actually "have more patience". His wife reports the only change is that he seems to be more "upset" in the evening when he is drinking alcohol.  His appetite is reduced since cancer treatments but he says it is "picking up". He and his wife note that he eats a lot of salty foods. He eats multiple servings of potato chips and almonds in one setting.  He exercises at [facility] and is working with a Clinical research associate there.   Social  History: Education: He reported history of attentional difficulties since early childhood and throughout school. He denied ever having been retained in grade. He graduated from high school. He attended one year of college but dropped out due to lack of interest. He served in [employer] for six years in the 1960s. Occupational history: Semi-retired self-employed Environmental education officer for [employer] Marital history: Married (he cannot recall exactly how long, 40+ years), one adult son (he cannot recall exact age, guess 78 yo), 2 grandchildren Alcohol/Tobacco/Substances: He is a former smoker, having stopped smoking cigarettes in 2012 and then cigars in 2016. He continues to drink alcohol nightly. He reportedly drinks a total of 4-5 oz of liquor (usually gin or scotch) a night. His wife also mentions that he frequently drinks sparkling wine in addition to this though not every night. He does not drink during the day. He denied recreational drug use.   Medical History:  Past Medical History:  Diagnosis Date  . ADHD (attention deficit hyperactivity disorder)   . Anxiety   . Arthritis   . Cancer of base of tongue ([place]) 03/05/14  squamous cell carcinoma  . Dysuria 07/15/2014  . GERD (gastroesophageal reflux disease)   . History of radiation therapy 04/06/14- 05/25/14   Base of tongue and bilateral neck 70 Gy in 35 fractions to gross disease, 63 Gy in 35 Fractions to high risk nodal echelons, and 56 Gy in 35 fractions to intermediate risk nodal echelons  . Hypercholesterolemia   . Hyperlipemia   . Hypertension   . Kidney stones   . Peyronie's disease   . TIA (transient ischemic attack)   . TIA (transient ischemic attack) 12/29/12   left facial droop  . Wears glasses     Current medications:  Outpatient Encounter Prescriptions as of 02/16/2016  Medication Sig  . amphetamine-dextroamphetamine (ADDERALL) 20 MG tablet Take 20 mg by mouth daily. Reported on 12/22/2014  .  atorvastatin (LIPITOR) 20 MG tablet Place 1 tablet (20 mg total) into feeding tube daily at 6 PM. (Patient taking differently: Take 20 mg by mouth daily at 6 PM. )  . clopidogrel (PLAVIX) 75 MG tablet Place 1 tablet (75 mg total) into feeding tube daily. (Patient taking differently: Take 75 mg by mouth daily. )  . [facility] BISACODYL 5 MG EC tablet See admin instructions.  Marland Kitchen GAVILYTE-N WITH FLAVOR PACK 420 g solution See admin instructions.  Marland Kitchen levothyroxine (SYNTHROID, LEVOTHROID) 75 MCG tablet Place 1 tablet (75 mcg total) into feeding tube daily before breakfast. (Patient taking differently: Take 75 mcg by mouth daily before breakfast. )  . sodium fluoride (PREVIDENT 5000 PLUS) 1.1 % CREA dental cream Apply to tooth brush. Brush teeth for 2 minutes. Spit out excess-DO NOT swallow. Repeat nightly.  . thiamine 100 MG tablet Take 100 mg by mouth daily.    No facility-administered encounter medications on file as of 02/16/2016.     Current Examination:  Behavioral Observations:   Appearance: Neatly and appropriately dressed and groomed Gait: Ambulated independently, no abnormalities observed Speech: Fluent; normal rate, rhythm and volume. Repeats himself on occasion. Thought process: Linear, goal directed Affect: Full, euthymic Interpersonal: Pleasant, appropriate Demonstrates short term memory deficits -- e.g., seems to have no memory of EMG being scheduled for today and does not know what the procedure is although it was discussed with Dr. Posey Pronto yesterday. Demonstrates frustration with difficulty on testing. Is focused during interview. Is distractible during testing but is easily redirected to task at hand. Orientation: Oriented to all spheres. Accurately names the current President but inaccurately states his predecessor as Doctor, general practice. Note that patient was medicated with Adderall (as prescribed) for his evaluation in order to correct for ADHD symptoms and assess for superimposed cognitive  disorder.  Tests Administered: . Test of Premorbid Functioning (TOPF) . Wechsler Adult Intelligence Scale-Fourth Edition (WAIS-IV): Similarities, Block Design, Matrix Reasoning, Coding and Digit Span subtests . Wechsler Memory Scale-Fourth Edition (WMS-IV) Older Adult Version (ages 8-90): Logical Memory I, II and Recognition subtests  . [employer] Verbal Learning Test - 2nd Edition (CVLT-2) Short Form . Repeatable Battery for the Assessment of Neuropsychological Status (RBANS) Form A:  Figure Copy and Recall subtests and Semantic Fluency subtest . Boston Diagnostic Aphasia Examination: Complex Ideational Material subtest . Controlled Oral Word Association Test (COWAT) . Trail Making Test A and B . Clock drawing test . [place] (BNT) . Geriatric Depression Scale (GDS) 15 Item . Generalized Anxiety Disorder - 7 item screener (GAD-7)  Test Results: Note: Standardized scores are presented only for use by appropriately trained professionals and to allow for any future test-retest comparison. These scores  should not be interpreted without consideration of all the information that is contained in the rest of the report. The most recent standardization samples from the test publisher or other sources were used whenever possible to derive standard scores; scores were corrected for age, gender, ethnicity and education when available.   Test Scores:  Test Name Raw Score Standardized Score Descriptor  TOPF 36/70 SS= 96 Average  WAIS-IV Subtests     Similarities 21/36 ss= 9 Average  Block Design 28/66 ss= 9 Average  Matrix Reasoning 9/26 ss= 8 Low end of average  Coding 34/135 ss= 7 Low average  Digit Span Forward 15/16 ss= 17 Very superior  Digit Span Backward 8/16 ss= 10 Average  WMS-IV Subtests     LM I 19/53 ss= 6 Low average  LM II 1/39 ss= 2 Impaired  LM II Recognition 15/23 Cum %: 10-16 Below average  RBANS Subtests     Figure Copy 20/20 Z= 1.2 High average  Figure Recall  0/20 Z= -3 Severely impaired  Semantic Fluency 15 Z= -1 Low average  CVLT-II Scores     Trial 1 6/9 Z= 0.5 Average  Trial 4 7/9 Z= 0 Average  Trials 1-4 total 28/36 T= 61 High average  SD Free Recall 3/9 Z= -2 Impaired  LD Free Recall 0/9 Z= -2 Impaired  LD Cued Recall 0/9 Z= -3 Severely impaired  Recognition Discriminability 7/9 hits, 2 false positives Z= -0.5 Average  Forced Choice Recognition 9/9  WNL  BDAE Subtest     Complex Ideational Material 10/12  Below expectation  COWAT-FAS 28 T= 38 Low average  COWAT-Animals 6 T= 21 Severely impaired  Trail Making Test A 49" 0 errors T= 45 Average  Trail Making Test B  145" 3 errors T= 45 Average  Clock Drawing   WNL   BNT 55/60 T= 53 Average  GDS-15 3/15  WNL   GAD-7 0/21  WNL       Description of Test Results:  Premorbid verbal intellectual abilities were estimated to have been within the average range based on a test of word reading. Psychomotor processing speed was low aveage. Basic auditory attention was very superior while more complex auditory attention (ie, working memory) was average. Visual-spatial construction was average to high average. Language abilities were variable. Specifically, confrontation naming was average, while semantic verbal fluency ranged from low average to severely impaired. Auditory comprehension of complex ideational material was below expectation. With regard to verbal memory, encoding and acquisition of non-contextual information (i.e., word list) was high average. After a brief distracter task, free recall was impaired (3/9 items recalled). After a delay, free recall was impaired (0/9 items recalled. He did not benefit from semantic cueing (severely impaired). Performance on a yes/no recognition task was average. On another verbal memory test, encoding and acquisition of contextual auditory information (i.e., short stories) was low average. After a delay, free recall was severely impaired. Performance on a  yes/no recognition task also fell below expectation. With regard to non-verbal memory, delayed free recall of visual information was severely impaired (no aspects of original stimulus were recalled). Performances on tests measuring executive functioning were within normal limits overall. Mental flexibility and set-shifting were average on Trails B. Verbal fluency with phonemic search restrictions was low average. Verbal abstract reasoning was average. Non-verbal abstract reasoning was low end of average. Performance on a clock drawing task was within normal limits. On self-report questionnaires, the patient's responses were not  indicative of clinically significant depression or  anxiety at the present time.    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 Results of the current evaluation demonstrate significant deficits in memory as well as mild impairment in semantic retrieval. These results demonstrate appreciable decline in memory function relative to his last evaluation with Dr. Valentina Shaggy a little over a year ago. Furthermore, deficits on the current evaluation were present even though the patient was medicated with his prescribed dose of Adderall. He did demonstrate improvement on tasks of inhibitory control, attentional set shifting, set maintenance and mental flexibility, which suggests that Adderall is helping with these areas of function (as would be expected).  In addition to memory impairments on testing, the patient is demonstrating functional decline with regard to work and instrumental ADLs. As such, diagnostic criteria for a dementia syndrome are met. His cognitive profile is concerning for Alzheimer's disease. However, given his history of daily alcohol use, an alcohol related dementia is also possible. Finally, while radiation may have exacerbated cognitive decline, I do not suspect that it is the underlying etiology,  given that his wife was noticing gradual decline before treatment was initiated. Fortunately, there is no evidence of a primary psychiatric disorder or significant psychosocial distress.    Recommendations/Plan: Based on the findings of the present evaluation, the following recommendations are offered:  1. Reducing alcohol intake is highly recommended, in order to reduce the risk/rate of cognitive decline as well as falls/subdural hemorrhage. He is currently reportedly consuming 4-5 oz of liquor each evening and sometimes sparkling wine in addition. It is recommended that he reduce this to 2 standard drinks per day (2 1.5 oz liquor drinks or 2 glasses of wine). Ideally, he would reduce intake even from that amount over time. 2. Adderall does seem to improve attention and executive functioning for this patient. If not contraindicated medically, this can be continued.  3. The patient may be an appropriate candidate for cholinesterase inhibitor therapy, if not contraindicated medically. 4. The patient performed well on a cognitive test highly correlated with driving ability. However, he is frequently going to the wrong location when driving due to memory deficits. As such, I recommend that he only drive to familiar locations and that he have someone with him when he is making longer trips or going to less familiar locations. His driving ability should continue to be monitored. 5. He will benefit from routine and structure in daily life. Oversight of medication administration is recommended. A daily calendar with external (eg alarm) reminders if possible will be useful.  6. Future planning, including PoA, advanced care planning and estate planning should all be put in place if they are not already. 7. His wife and business partner will benefit from carepartner education and support, available from various organizations including the Alzheimer's Association ([employer]).   Feedback to Patient: Dylan Moreno, his wife and his business partner returned for a feedback appointment on 02/16/2016 to review the results of his neuropsychological evaluation with this provider. 30 minutes face-to-face time was spent reviewing his test results, my impressions and my recommendations as detailed above.    Total time spent on this patient's case: [id number] unit for interview with psychologist; [phone] units of testing by psychometrician under psychologist's supervision; [phone] units for medical record review, scoring of neuropsychological tests, interpretation of test results, preparation of this report, and review of results to the patient by psychologist.      Thank you for your referral of Dylan Moreno.  Please feel free to contact me if you have any questions or concerns regarding this report.

## 2016-02-16 ENCOUNTER — Ambulatory Visit (INDEPENDENT_AMBULATORY_CARE_PROVIDER_SITE_OTHER): Payer: Medicare Other | Admitting: Psychology

## 2016-02-16 ENCOUNTER — Encounter: Payer: Self-pay | Admitting: Psychology

## 2016-02-16 DIAGNOSIS — F0390 Unspecified dementia without behavioral disturbance: Secondary | ICD-10-CM | POA: Diagnosis not present

## 2016-02-16 DIAGNOSIS — F03A0 Unspecified dementia, mild, without behavioral disturbance, psychotic disturbance, mood disturbance, and anxiety: Secondary | ICD-10-CM

## 2016-02-16 NOTE — Patient Instructions (Addendum)
Compared to your evaluation a little over a year ago with Dr. Valentina Shaggy, attention is improved (likely due to Adderall) but learning and memory for new information has worsened.  Unfortunately, I think this represents a dementia process - either beginning stage of Alzheimer's disease or dementia due to chronic alcohol use.  Recommendations: 1. Reducing alcohol intake is highly recommended, in order to reduce the risk/rate of cognitive decline as well as falls/subdural hemorrhage. It is recommended that you reduce daily use to 2 standard drinks per day (two 1.5-oz liquor drinks or two 5-oz glasses of wine). Ideally, you would reduce intake even from that amount over time. 2. You can speak with Dr. Posey Pronto about possible memory medications. These are sometimes useful in slowing the rate of memory decline. 3. You will likely benefit from structure and routine in daily life. You should use a calendar and review it regularly. Alarm reminders may be helpful.

## 2016-03-06 ENCOUNTER — Encounter: Payer: Self-pay | Admitting: Neurology

## 2016-03-06 ENCOUNTER — Ambulatory Visit (INDEPENDENT_AMBULATORY_CARE_PROVIDER_SITE_OTHER): Payer: Medicare Other | Admitting: Neurology

## 2016-03-06 VITALS — BP 120/70 | HR 68 | Ht 73.0 in | Wt 167.2 lb

## 2016-03-06 DIAGNOSIS — F0280 Dementia in other diseases classified elsewhere without behavioral disturbance: Secondary | ICD-10-CM | POA: Diagnosis not present

## 2016-03-06 DIAGNOSIS — G621 Alcoholic polyneuropathy: Secondary | ICD-10-CM | POA: Diagnosis not present

## 2016-03-06 MED ORDER — DONEPEZIL HCL 10 MG PO TABS: ORAL_TABLET | ORAL | 3 refills | Status: DC

## 2016-03-06 NOTE — Progress Notes (Signed)
Follow-up Visit   Date: 03/06/16    [name] MRN: [id number] DOB: 1940/03/16   Interim History: Dylan Moreno is a 76 y.o. right-handed Caucasian male with history of hyperlipidemia, tobacco use, ADD, TIA (12/29/2012), squamous cell carcinoma s/p chemo/radiation (Spring 2016) returning to the clinic for complaints of memory impairment.  The patient was accompanied to the clinic by wife.  History of present illness: He presented to the Emergency Department on 12/29/2012 with acute onset of left facial droop, lasting one hour. He woke up that morning feeling dizzy and lightheaded. His son, who is a Agricultural consultant, noticed his facial droop and brought him to the ED for evaluation. He CT of the brain, MRI/A of the brain which did not show any acute stroke. There was a note of ventriculomegaly suggestive of NPH. US carotids showed patent ICAs and echocardiogram did not reveal an embolic source. He was previously taking lipitor 10mg  and was taking aspirin 81mg  daily. He was discharged on aspirin 325mg .  No prior personal or family history of stroke or TIA.   - Follow-up 05/04/2013:  At his last visit, I switched him to plavix.  48-hr holter testing did not show any arrhythmias.   He is tolerating it well and denies any new neurological symptoms. He is trying to cut back on smoking cigars.   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 He also underwent PEG tube placement. He was admitted to [facility] 5/45 - 06/03/2014 because of agitation, hallucinations, and delirium which had been ongoing for a week prior to presentation and thought to be due to combination of pain medications.  He also stopped taking Adderal one week prior to symptom onset  While hospitalized, his pain medications were slowly reduced and he started becoming  less agitated.  He was discharged to rehab facility on 6/2.  The following day, patient's family noted that his right pupil was larger so was taken to the emergency department where neurology evaluated him. Exam was notable for right pupil 6 mm and left pupil 3 mm, round, reactive to light and accommodation--no APD.  CTA did not demonstrate any vascular abnormalities.  Pupil asymmetry lasted about 2 days.  Over the past week, family has noticed a huge improvement and say that he is almost back to his baseline but continues to have memory problems.  He cannot keep up with reading as well and reports to reading 5 hours per day.  His son feels that he does not remember to focus on tasks such as what is taught in PT (how to stand/sit, etc).    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   UPDATE 01/31/2015:  He denies any new cognitive changes, their neuropsychological testing with Dr. Valentina Shaggy showed neurocognitive disorder, unspecified, and ADHD.  He is scheduled to have a driving assessment tomorrow but is argumentative as to the reason why this needs to be done.  He also complains of spells of right leg trembling, especially after working out with his trainer.  Family denies any new cognitive issues.   He is having his PEG removed on Wednesday.   UPDATE 08/01/2015:  He reports doing well and has been seeing a trainer twice per week.  He continues to have problems with  short-term memory, but overall is doing well.  He passed his driver's evaluation and has been driving locally.  His weight continues to be low which he attributes to poor appetite.  He drinks 2-3 cans of ensure daily.  His wife states that his diet choices are poor and include chips, cheese and cracks, and almonds.  He does not eat vegetables.  His also complains of imbalance, and denies falls.  He still drink 2-3 drinks daily.    UPDATE 02/06/2016:  Patient is here with his wife for 6 month follow-up.   Since his last visit, his wife has noticed greater difficulty using his phone and remote control, such as remembering how to record things or use applications. He quickly forgets short-term events/conversations, such as what he had for dinner.  There has been no hallucinations or changes in behavior.  He tends to be irritable at night, which he wife attributes to his drinking.  Despite my recommendation to abstain from alcohol, he continues to consume 2-3 drinks nightly.  He has difficulty reading a novels, but is still able to keep up with newspaper. He reports to having one fall due to a rug being on the floor and wife feels that he drags his left leg sometimes.  He is walking unassisted.  He denies any incontinence.    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  He has not been compliant with his medications for ADHD.  He reports cutting back his alcohol intake, but wife disagrees with him.  He states that he only drinks 2 glasses of wine nightly and has stopped hard liquor.  They have many questions regarding the prognosis and management options.   Medications:  Current Outpatient Prescriptions on File Prior to Visit  Medication Sig Dispense Refill  . amphetamine-dextroamphetamine (ADDERALL) 20 MG tablet Take 20 mg by mouth daily. Reported on 12/22/2014    . atorvastatin (LIPITOR) 20 MG tablet Place 1 tablet (20 mg total) into feeding tube daily at 6 PM. (Patient taking differently: Take 20 mg by mouth daily at 6 PM. ) 30 tablet 0  . clopidogrel (PLAVIX) 75 MG tablet Place 1 tablet (75 mg total) into feeding tube daily. (Patient taking differently: Take 75 mg by mouth daily. ) 30 tablet 0  . [facility] BISACODYL 5 MG EC tablet See admin instructions.  0  . GAVILYTE-N WITH FLAVOR PACK 420 g solution See admin instructions.  0  . levothyroxine (SYNTHROID, LEVOTHROID) 75 MCG tablet Place 1 tablet  (75 mcg total) into feeding tube daily before breakfast. (Patient taking differently: Take 75 mcg by mouth daily before breakfast. ) 30 tablet 0  . sodium fluoride (PREVIDENT 5000 PLUS) 1.1 % CREA dental cream Apply to tooth brush. Brush teeth for 2 minutes. Spit out excess-DO NOT swallow. Repeat nightly. 1 Tube prn  . thiamine 100 MG tablet Take 100 mg by mouth daily.      No current facility-administered medications on file prior to visit.     Allergies: No Known Allergies   Review of Systems:  CONSTITUTIONAL: No fevers, chills, night sweats, or weight loss.   EYES: No visual changes or eye pain ENT: No hearing changes.  No history of nose bleeds.   RESPIRATORY: No cough, wheezing and shortness of breath.   CARDIOVASCULAR: Negative for chest pain, and palpitations.   GI: Negative for abdominal discomfort, blood in stools or black stools.  No  recent change in bowel habits.   GU:  No history of incontinence.   MUSCLOSKELETAL: No history of joint pain or swelling.  No myalgias.   SKIN: Negative for lesions, rash, and itching.   ENDOCRINE: Negative for cold or heat intolerance, polydipsia or goiter.   PSYCH:  No depression or anxiety symptoms.   NEURO: As Above.   Vital Signs:  BP 120/70   Pulse 68   Ht 6\' 1"  (1.854 m)   Wt 167 lb 4 oz (75.9 kg)   SpO2 99%   BMI 22.07 kg/m   Neurological Exam: MENTAL STATUS:  Alert, awake, and easily engages in conversation. Well groomed and dressed. Oriented x5. Speech is not dysarthric, but there is intermittent stuttering of speech (old) and he has scattered thought process and easily distractible.  Formal neurological exam deferred as much of this visit was counseling.  Data: US carotids 12/30/2012: Findings suggest 1-39% internal carotid artery stenosis bilaterally. Vertebral arteries are patent with antegrade flow.   MRI/A brain 12/29/2012:  1. Ventricular prominence is disproportionate to the degree of atrophy, suggesting normal  pressure hydrocephalus.  2. No other acute intracranial abnormality. Specifically, there is no evidence for acute or subacute infarct.  3. Mild distal small vessel disease is evident on the MRA without significant proximal stenosis, aneurysm, or branch vessel occlusion.   48-hr holter:  NSR, rare PVCs  CT head 06/04/2014: Ventricular prominence similar to prior MR suggesting normal pressure hydrocephalus. No intracranial hemorrhage. No CT evidence of large acute infarct. No intracranial mass or abnormal enhancement.  CTA head and neck 06/04/2014:   Calcified plaque with mild to slightly moderate narrowing cavernous segment internal carotid artery bilaterally. Mild narrowing and irregularity M1 segment left middle cerebral artery. Mild narrowing distal left vertebral artery.  MRI lumbar spine wo contrast 09/30/2014: 1. Minimal left foraminal narrowing at L4-5 and L5-S1 secondary to leftward disc protrusions and facet hypertrophy. 2. Leftward disc protrusion at L3-4 without significant stenosis.  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  Lab Results  Component Value Date   TSH 2.987 05/27/2014   Lab Results  Component Value Date   [id number] 05/27/2014    IMPRESSION: 1.  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    - They would like to start donepezil for memory and he will be more compliant with his Adderall.    - Start donepezil 5mg  daily x 1 month, then increase to 10mg  daily.  Side effects  discussed.  Will monitor heart rate  - Strongly encouraged him to reduce to alcohol intake as this is contributing to his cognition and neuropathy  - Encouraged him to start exercise program and engage in mentally stimulating activities  - CT head showing ventricular prominence, cannot exclude the likelihood of NPH, but gait appears stable and there is no incontinence, so will continue to follow  - If there is marked change in his neurocognitive testing, it would be prudent to repeat MRI brain  2. Alcoholic peripheral neuropathy causing sensory ataxia, again, alcohol cessation strongly encouraged.   3. 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  Return to clinic in 3-4 months  The duration of this appointment visit was 25 minutes of face-to-face time with the patient.  Greater than 50% of this time was spent in counseling, explanation of diagnosis, planning of further management, and coordination of care.   Thank you for allowing me to participate in patient's care.  If I can answer any additional questions, I would be pleased to do so.    Sincerely,    Hallelujah Wysong K. Posey Pronto, DO

## 2016-03-06 NOTE — Patient Instructions (Addendum)
1.  Start donepezil 5mg  (half-tab) for one month, then increase to 10mg  (1 tab) daily. 2.  Encouraged to reduce alcohol intake 3.  Start exercise program and engage in mentally stimulating activities

## 2016-03-08 DIAGNOSIS — R509 Fever, unspecified: Secondary | ICD-10-CM | POA: Diagnosis not present

## 2016-03-08 DIAGNOSIS — J069 Acute upper respiratory infection, unspecified: Secondary | ICD-10-CM | POA: Diagnosis not present

## 2016-05-11 DIAGNOSIS — R3915 Urgency of urination: Secondary | ICD-10-CM | POA: Diagnosis not present

## 2016-05-11 DIAGNOSIS — N401 Enlarged prostate with lower urinary tract symptoms: Secondary | ICD-10-CM | POA: Diagnosis not present

## 2016-05-17 ENCOUNTER — Telehealth: Payer: Self-pay

## 2016-05-17 NOTE — Telephone Encounter (Signed)
Called wife regarding app `/+-+*

## 2016-05-17 NOTE — Telephone Encounter (Signed)
Called patient's wife, he will come in tomorrow at 1pm.

## 2016-05-18 ENCOUNTER — Ambulatory Visit (HOSPITAL_BASED_OUTPATIENT_CLINIC_OR_DEPARTMENT_OTHER): Payer: Medicare Other

## 2016-05-18 ENCOUNTER — Telehealth: Payer: Self-pay | Admitting: Hematology and Oncology

## 2016-05-18 ENCOUNTER — Ambulatory Visit (HOSPITAL_BASED_OUTPATIENT_CLINIC_OR_DEPARTMENT_OTHER): Payer: Medicare Other | Admitting: Hematology and Oncology

## 2016-05-18 ENCOUNTER — Encounter: Payer: Self-pay | Admitting: Adult Health

## 2016-05-18 ENCOUNTER — Encounter: Payer: Self-pay | Admitting: Hematology and Oncology

## 2016-05-18 VITALS — BP 131/74 | HR 74 | Temp 98.7°F | Resp 18 | Ht 73.0 in | Wt 167.6 lb

## 2016-05-18 DIAGNOSIS — E039 Hypothyroidism, unspecified: Secondary | ICD-10-CM

## 2016-05-18 DIAGNOSIS — F1010 Alcohol abuse, uncomplicated: Secondary | ICD-10-CM

## 2016-05-18 DIAGNOSIS — R2689 Other abnormalities of gait and mobility: Secondary | ICD-10-CM | POA: Diagnosis not present

## 2016-05-18 DIAGNOSIS — C01 Malignant neoplasm of base of tongue: Secondary | ICD-10-CM | POA: Diagnosis not present

## 2016-05-18 DIAGNOSIS — R911 Solitary pulmonary nodule: Secondary | ICD-10-CM | POA: Diagnosis not present

## 2016-05-18 LAB — COMPREHENSIVE METABOLIC PANEL
ALBUMIN: 4 g/dL (ref 3.5–5.0)
ALT: 23 U/L (ref 0–55)
ANION GAP: 9 meq/L (ref 3–11)
AST: 23 U/L (ref 5–34)
Alkaline Phosphatase: 97 U/L (ref 40–150)
BILIRUBIN TOTAL: 1.26 mg/dL — AB (ref 0.20–1.20)
BUN: 19.9 mg/dL (ref 7.0–26.0)
CALCIUM: 9.9 mg/dL (ref 8.4–10.4)
CO2: 24 mEq/L (ref 22–29)
CREATININE: 1 mg/dL (ref 0.7–1.3)
Chloride: 107 mEq/L (ref 98–109)
EGFR: 73 mL/min/{1.73_m2} — ABNORMAL LOW (ref >90)
Glucose: 105 mg/dl (ref 70–140)
Potassium: 4.1 mEq/L (ref 3.5–5.1)
Sodium: 141 mEq/L (ref 136–145)
TOTAL PROTEIN: 6.9 g/dL (ref 6.4–8.3)

## 2016-05-18 LAB — CBC WITH DIFFERENTIAL/PLATELET
BASO%: 0.5 % (ref 0.0–2.0)
Basophils Absolute: 0 10*3/uL (ref 0.0–0.1)
EOS ABS: 0.1 10*3/uL (ref 0.0–0.5)
EOS%: 1.2 % (ref 0.0–7.0)
HEMATOCRIT: 42.8 % (ref 38.4–49.9)
HGB: 14.4 g/dL (ref 13.0–17.1)
LYMPH%: 9.6 % — ABNORMAL LOW (ref 14.0–49.0)
MCH: 30.2 pg (ref 27.2–33.4)
MCHC: 33.6 g/dL (ref 32.0–36.0)
MCV: 89.8 fL (ref 79.3–98.0)
MONO#: 0.5 10*3/uL (ref 0.1–0.9)
MONO%: 10.2 % (ref 0.0–14.0)
NEUT#: 4.1 10*3/uL (ref 1.5–6.5)
NEUT%: 78.5 % — AB (ref 39.0–75.0)
Platelets: 203 10*3/uL (ref 140–400)
RBC: 4.76 10*6/uL (ref 4.20–5.82)
RDW: 13.8 % (ref 11.0–14.6)
WBC: 5.2 10*3/uL (ref 4.0–10.3)
lymph#: 0.5 10*3/uL — ABNORMAL LOW (ref 0.9–3.3)

## 2016-05-18 LAB — TSH: TSH: 3.858 m[IU]/L (ref 0.320–4.118)

## 2016-05-18 NOTE — Progress Notes (Signed)
[facility] OFFICE PROGRESS NOTE  Patient Care Team: Seward Carol, MD as PCP - General (Internal Medicine) Leota Sauers, RN as Oncology Nurse Navigator Eppie Gibson, MD as Attending Physician (Radiation Oncology) Heath Lark, MD as Consulting Physician (Hematology and Oncology) Karie Mainland, RD as Dietitian (Nutrition)  SUMMARY OF ONCOLOGIC HISTORY:   Malignant neoplasm of base of tongue ([place])   03/03/2014 Imaging    Ct neck elsewhere showed base of tongue mass crossing midline, bilateral LN enlargement, great >4 cm      03/05/2014 Procedure    He has FNA biopsy of LN      03/05/2014 Pathology Results    NZA 16-471 biopsy confirmed squmaous cell carcinoma HPV positive      03/18/2014 Imaging    PET/Ct scan showed tongue mass, bilateral LN      04/02/2014 Procedure    He has placement of feeding tube and port      04/06/2014 - 05/25/2014 Radiation Therapy    Rec'd RT to base of tongue and bilateral neck:  70 Gy in 35 fractions to gross disease, 63 Gy in 35 fractions to high risk nodal echelons, and 56 Gy in 35 fractions to intermediate risk nodal echelons.       04/07/2014 - 05/05/2014 Chemotherapy    He received 2 doses of high dose cisplatin. He could not received a third dose due to profound side effects.      04/28/2014 Adverse Reaction    Cycle 2 chemotherapy is delayed due to neutropenia      05/25/2014 - 06/03/2014 Hospital Admission    He was admitted to the hospital for management of acute delirium.      07/19/2014 Imaging    PET/CT, restaging:  Resolution of tongue base lesion; significant decrease inbilateral cervical lymphadenopathy.  No definite evidence of metastatic disease.      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      12/21/2014  Imaging    Repeat Ct scan showed no evidence of cancer      02/02/2015 Procedure    Port-a-cath and PEG removed.      05/23/2015 Imaging    1. Lung-RADS Category 2, benign appearance or behavior. Continue annual screening with low-dose chest CT without contrast in 12 months. 2. Three-vessel coronary artery calcification       INTERVAL HISTORY: Please see below for problem oriented charting. I saw him and his wife The patient continues to drink on a regular basis He has not seen ENT physician since October of last year He denies significant swallowing difficulties His speech is excellent He complained of balance issue with near falls episodes recently He usually uses a cane but forgot his cane today He has been some memory issue. There were no reported altered mental status or confusion lately He wants to walk more but is afraid he might fall His appetite is stable and he is able to retain his weight His wife noted he has been eating a lot of salty food He denies chest pain or shortness of breath He continues to work on the regular basis  REVIEW OF SYSTEMS:   Constitutional: Denies fevers, chills or abnormal weight loss Eyes: Denies blurriness of vision Ears, nose, mouth, throat, and face: Denies mucositis or  sore throat Respiratory: Denies cough, dyspnea or wheezes Cardiovascular: Denies palpitation, chest discomfort or lower extremity swelling Gastrointestinal:  Denies nausea, heartburn or change in bowel habits Skin: Denies abnormal skin rashes Lymphatics: Denies new lymphadenopathy or easy bruising Neurological:Denies numbness, tingling or new weaknesses Behavioral/Psych: Mood is stable, no new changes  All other systems were reviewed with the patient and are negative.  I have reviewed the past medical history, past surgical history, social history and family history with the patient and they are unchanged from previous note.  ALLERGIES:  has No Known  Allergies.  MEDICATIONS:  Current Outpatient Prescriptions  Medication Sig Dispense Refill  . amphetamine-dextroamphetamine (ADDERALL) 20 MG tablet Take 20 mg by mouth daily. Reported on 12/22/2014    . atorvastatin (LIPITOR) 20 MG tablet Place 1 tablet (20 mg total) into feeding tube daily at 6 PM. (Patient taking differently: Take 20 mg by mouth daily at 6 PM. ) 30 tablet 0  . clopidogrel (PLAVIX) 75 MG tablet Place 1 tablet (75 mg total) into feeding tube daily. (Patient taking differently: Take 75 mg by mouth daily. ) 30 tablet 0  . [facility] BISACODYL 5 MG EC tablet See admin instructions.  0  . donepezil (ARICEPT) 10 MG tablet Take half tablet daily x 1 month, then increase to 1 tablet daily. 90 tablet 3  . GAVILYTE-N WITH FLAVOR PACK 420 g solution See admin instructions.  0  . levothyroxine (SYNTHROID, LEVOTHROID) 75 MCG tablet Place 1 tablet (75 mcg total) into feeding tube daily before breakfast. (Patient taking differently: Take 75 mcg by mouth daily before breakfast. ) 30 tablet 0  . sodium fluoride (PREVIDENT 5000 PLUS) 1.1 % CREA dental cream Apply to tooth brush. Brush teeth for 2 minutes. Spit out excess-DO NOT swallow. Repeat nightly. 1 Tube prn  . tamsulosin (FLOMAX) 0.4 MG CAPS capsule Take 0.4 mg by mouth daily.  3  . thiamine 100 MG tablet Take 100 mg by mouth daily.      No current facility-administered medications for this visit.     PHYSICAL EXAMINATION: ECOG PERFORMANCE STATUS: 1 - Symptomatic but completely ambulatory  Vitals:   05/18/16 1319  BP: 131/74  Pulse: 74  Resp: 18  Temp: 98.7 F (37.1 C)   Filed Weights   05/18/16 1319  Weight: 167 lb 9.6 oz (76 kg)    GENERAL:alert, no distress and comfortable SKIN: skin color, texture, turgor are normal, no rashes or significant lesions EYES: normal, Conjunctiva are pink and non-injected, sclera clear OROPHARYNX:no exudate, no erythema and lips, buccal mucosa, and tongue normal  NECK: supple, thyroid normal  size, non-tender, without nodularity LYMPH:  no palpable lymphadenopathy in the cervical, axillary or inguinal LUNGS: clear to auscultation and percussion with normal breathing effort HEART: regular rate & rhythm and no murmurs and no lower extremity edema ABDOMEN:abdomen soft, non-tender and normal bowel sounds Musculoskeletal:no cyanosis of digits and no clubbing  NEURO: alert & oriented x 3 with fluent speech, no focal motor/sensory deficits  LABORATORY DATA:  I have reviewed the data as listed    Component Value Date/Time   NA 138 11/02/2014 1013   K 4.5 11/02/2014 1013   CL 96 (L) 06/04/2014 1352   CO2 25 11/02/2014 1013   GLUCOSE 131 11/02/2014 1013   BUN 23.9 11/02/2014 1013   CREATININE 1.1 11/02/2014 1013   CALCIUM 10.0 11/02/2014 1013   PROT 6.9 11/02/2014 1013   ALBUMIN 3.9 11/02/2014 1013   AST 22 11/02/2014 1013   ALT  23 11/02/2014 1013   ALKPHOS 97 11/02/2014 1013   BILITOT 0.87 11/02/2014 1013   GFRNONAA >60 06/04/2014 1352   GFRAA >60 06/04/2014 1352    No results found for: SPEP, UPEP  Lab Results  Component Value Date   WBC 5.3 02/02/2015   NEUTROABS 4.2 02/02/2015   HGB 15.2 02/02/2015   HCT 44.5 02/02/2015   MCV 90.1 02/02/2015   PLT 224 02/02/2015      Chemistry      Component Value Date/Time   NA 138 11/02/2014 1013   K 4.5 11/02/2014 1013   CL 96 (L) 06/04/2014 1352   CO2 25 11/02/2014 1013   BUN 23.9 11/02/2014 1013   CREATININE 1.1 11/02/2014 1013      Component Value Date/Time   CALCIUM 10.0 11/02/2014 1013   ALKPHOS 97 11/02/2014 1013   AST 22 11/02/2014 1013   ALT 23 11/02/2014 1013   BILITOT 0.87 11/02/2014 1013      ASSESSMENT & PLAN:  Malignant neoplasm of base of tongue Clinically, he has no signs of cancer He has not seen ENT specialist since October 2017 I recommend he calls the ENT office to set up a return appointment I will see him once a year for supportive care The patient is warned that chronic alcoholism can  cause increased risk of recurrence cancer  Balance disorder He has balance problem causing inability to exercise fully I suspect it could be caused by residual side effects from prior treatment I recommend referral to PT/rehab center for gait training and further assessment  Alcohol abuse The patient continues to drink on a regular basis; wine and gin I warned him about risk of recurrence of cancer, other new cancer, dementia and other risks related to alcohol use I recommend he quit immediately. He is only allowed 1 drink for Christmas and Thanksgiving only. I made him promise to quit before I see him back again in 1 year and the patient is determined to discontinue alcohol intake  Solitary pulmonary nodule on lung CT His recent CT scan a year ago detected lung nodule Radiologist recommended repeat in 1 year I order repeat CT scan to be done in 2 weeks and will call him with test results  Acquired hypothyroidism He has acquired hypothyroidism He is not clear whether he has his thyroid function tests checked lately I will repeat blood work today and will call him with test results    Orders Placed This Encounter  Procedures  . CT Chest W Contrast    Standing Status:   Future    Standing Expiration Date:   05/18/2017    Order Specific Question:   If indicated for the ordered procedure, I authorize the administration of contrast media per Radiology protocol    Answer:   Yes    Order Specific Question:   Reason for Exam (SYMPTOM  OR DIAGNOSIS REQUIRED)    Answer:   lung nodule, Hx tongue cancer, follow-up scan    Order Specific Question:   Preferred imaging location?    Answer:   [facility]    Order Specific Question:   Radiology Contrast Protocol - do NOT remove file path    Answer:   [email or web address]  . Comprehensive metabolic panel    Standing Status:   Future    Standing Expiration Date:   06/22/2017  . CBC with Differential/Platelet     Standing Status:   Future    Standing Expiration Date:   06/22/2017  .  TSH    Standing Status:   Future    Standing Expiration Date:   06/22/2017  . Ambulatory Referral to Physical Therapy    Referral Priority:   Routine    Referral Type:   Physical Medicine    Referral Reason:   Specialty Services Required    Requested Specialty:   Physical Therapy    Number of Visits Requested:   1   All questions were answered. The patient knows to call the clinic with any problems, questions or concerns. No barriers to learning was detected. I spent 25 minutes counseling the patient face to face. The total time spent in the appointment was 30 minutes and more than 50% was on counseling and review of test results     Heath Lark, MD 05/18/2016 2:03 PM

## 2016-05-18 NOTE — Telephone Encounter (Signed)
Gave patient AVS and calender per 5/18 los.  

## 2016-05-18 NOTE — Assessment & Plan Note (Signed)
He has acquired hypothyroidism He is not clear whether he has his thyroid function tests checked lately I will repeat blood work today and will call him with test results

## 2016-05-18 NOTE — Assessment & Plan Note (Signed)
His recent CT scan a year ago detected lung nodule Radiologist recommended repeat in 1 year I order repeat CT scan to be done in 2 weeks and will call him with test results

## 2016-05-18 NOTE — Assessment & Plan Note (Signed)
He has balance problem causing inability to exercise fully I suspect it could be caused by residual side effects from prior treatment I recommend referral to PT/rehab center for gait training and further assessment

## 2016-05-18 NOTE — Assessment & Plan Note (Signed)
The patient continues to drink on a regular basis; wine and gin I warned him about risk of recurrence of cancer, other new cancer, dementia and other risks related to alcohol use I recommend he quit immediately. He is only allowed 1 drink for Christmas and Thanksgiving only. I made him promise to quit before I see him back again in 1 year and the patient is determined to discontinue alcohol intake

## 2016-05-18 NOTE — Assessment & Plan Note (Signed)
Clinically, he has no signs of cancer He has not seen ENT specialist since October 2017 I recommend he calls the ENT office to set up a return appointment I will see him once a year for supportive care The patient is warned that chronic alcoholism can cause increased risk of recurrence cancer

## 2016-05-21 ENCOUNTER — Ambulatory Visit: Payer: Medicare Other | Attending: Hematology and Oncology | Admitting: Physical Therapy

## 2016-05-21 DIAGNOSIS — R2689 Other abnormalities of gait and mobility: Secondary | ICD-10-CM | POA: Diagnosis not present

## 2016-05-21 DIAGNOSIS — R293 Abnormal posture: Secondary | ICD-10-CM | POA: Diagnosis not present

## 2016-05-21 DIAGNOSIS — R2681 Unsteadiness on feet: Secondary | ICD-10-CM

## 2016-05-21 NOTE — Therapy (Signed)
[place], [place], [address] Phone: [phone]   Fax:  [phone]  Physical Therapy Evaluation  Patient Details  Name: Dylan Moreno MRN: [id number] Date of Birth: 1940-08-28 Referring Provider: Dr. Heath Lark  Encounter Date: 05/21/2016      PT End of Session - 05/21/16 1630    Visit Number 1   Number of Visits 9   Date for PT Re-Evaluation 06/29/16   PT Start Time 6962   PT Stop Time 1433   PT Time Calculation (min) 48 min   Activity Tolerance Patient tolerated treatment well   Behavior During Therapy [facility] for tasks assessed/performed      Past Medical History:  Diagnosis Date  . ADHD (attention deficit hyperactivity disorder)   . Anxiety   . Arthritis   . 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 Dysuria 07/15/2014  . GERD (gastroesophageal reflux disease)   . History of radiation therapy 04/06/14- 05/25/14   Base of tongue and bilateral neck 70 Gy in 35 fractions to gross disease, 63 Gy in 35 Fractions to high risk nodal echelons, and 56 Gy in 35 fractions to intermediate risk nodal echelons  . Hypercholesterolemia   . Hyperlipemia   . Hypertension   . Kidney stones   . Peyronie's disease   . TIA (transient ischemic attack)   . TIA (transient ischemic attack) 12/29/12   left facial droop  . Wears glasses     Past Surgical History:  Procedure Laterality Date  . COLONOSCOPY    . CYSTOSCOPY W/ URETEROSCOPY W/ LITHOTRIPSY  2011  . 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   Procedure: DUPUYTREN CONTRACTURE RELEASE RIGHT PALM,RING AND SMALL FINGER;  Surgeon: Cammie Sickle., MD;  Location: [place];  Service: Orthopedics;  Laterality: Right;  . HAND SURGERY     right  . SHOULDER ARTHROSCOPY W/ ROTATOR CUFF REPAIR     left  . TONSILLECTOMY      There were no vitals filed for this  visit.       Subjective Assessment - 05/21/16 1347    Subjective "I work out but my balance is not good, and I don't have much energy, and I want to see if I can fix that.  I don't have much of an appetite." Says he works out two times a weeks with a Clinical research associate; he does a lot of resistance work for UEs and LEs.  He has started getting back to walking as he did before his treatment.   Pertinent History Diagnosed 3/16 with cancer on base of tongue. He underwent chemotherapy and radiation and completed that on 05/25/14.  He reports swelling in his neck began after radiation around 06/02/14.  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   Patient Stated Goals I want to get back to normal.   Currently in Pain? No/denies            [facility] PT Assessment - 05/21/16 0001      Assessment   Medical Diagnosis base of tongue cancer diagnosed 03/2014 and treated with chemoradiation   Referring Provider Dr. Heath Lark   Hand Dominance Right   Prior Therapy none     Precautions   Precautions Fall   Precaution Comments cancer  precautions     Restrictions   Weight Bearing Restrictions No     Balance Screen   Has the patient fallen in the past 6 months No   Has the patient had a decrease in activity level because of a fear of falling?  No   Is the patient reluctant to leave their home because of a fear of falling?  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; goes in, but doesn't work  much   Leisure works out with a Clinical research associate 2x/week; trying to do more walking     Posture/Postural Control    Posture/Postural Control Postural limitations   Postural Limitations Rounded Shoulders;Forward head;Increased thoracic kyphosis     ROM / Strength   AROM / PROM / Strength AROM;Strength     AROM   Overall AROM Comments both arms and legs GROSSLY WFL   AROM Assessment Site Cervical   Cervical Flexion WFL   Cervical Extension 30   Cervical - Right Side Bend 20   Cervical - Left Side Bend 15   Cervical - Right Rotation 25%   Cervical - Left Rotation 25%     Strength   Overall Strength Comments Both UEs grossly 5/5 at shoulders and elbows; ankles grossly 5/5; hip extensors 4/5, flexors ~4/5; abductors 4+/5; knee flexors 4+ right, 4 left; quads approx. 4+/5     Ambulation/Gait   Ambulation/Gait Yes   Gait Comments gait appears unsteady with wider base than normal; he uses no assistive device but has a cane if needed     Balance   Balance Assessed Yes     Standardized Balance Assessment   Standardized Balance Assessment Timed Up and Go Test     Timed Up and Go Test   Normal TUG (seconds) 10     Functional Gait  Assessment   Gait assessed  Yes   Gait Level Surface Walks 20 ft in less than 7 sec but greater than 5.5 sec, uses assistive device, slower speed, mild gait deviations, or deviates 6-10 in outside of the 12 in walkway width.  6 seconds   Change in Gait Speed Able to smoothly change walking speed without loss of balance or gait deviation. Deviate no more than 6 in outside of the 12 in walkway width.   Gait with Horizontal Head Turns Performs head turns smoothly with slight change in gait velocity (eg, minor disruption to smooth gait path), deviates 6-10 in outside 12 in walkway width, or uses an assistive device.   Gait with Vertical Head Turns Performs task with slight change in gait velocity (eg, minor disruption to smooth gait path), deviates 6 - 10 in outside 12 in walkway width or uses assistive device   Gait and Pivot Turn Pivot turns safely within 3 sec and stops quickly  with no loss of balance.   Step Over Obstacle Is able to step over 2 stacked shoe boxes taped together (9 in total height) without changing gait speed. No evidence of imbalance.   Gait with Narrow Base of Support Ambulates 4-7 steps.  has difficulty doing heel-toe meticulously   Gait with Eyes Closed Walks 20 ft,  uses assistive device, slower speed, mild gait deviations, deviates 6-10 in outside 12 in walkway width. Ambulates 20 ft in less than 9 sec but greater than 7 sec.  approx. (interrupted)   Ambulating Backwards Walks 20 ft, no assistive devices, good speed, no evidence for imbalance, normal gait   Steps Alternating feet, no rail.   Total Score 24           LYMPHEDEMA/ONCOLOGY QUESTIONNAIRE - 05/21/16 1401      Type   Cancer Type base of tongue cancer     Date Lymphedema/Swelling Started   Date 06/02/14  approx.     Treatment   Past Chemotherapy Treatment Yes   Date 05/25/14  approx.   Past Radiation Treatment Yes   Date 05/25/14     Lymphedema Assessments   Lymphedema Assessments Head and Neck     Head and Neck   4 cm superior to sternal notch around neck 40.2 cm   6 cm superior to sternal notch around neck 42.8 cm   8 cm superior to sternal notch around neck 43.7 cm                                Long Term Clinic Goals - 05/21/16 1640      CC Long Term Goal  #1   Title Patient will be independent in home exercise program for gait and posture   Time 4   Period Weeks   Status New     CC Long Term Goal  #2   Title Patient will be knowledgeable about the importance of LE strengthening in his workouts with the trainer at his club   Time 4   Period Weeks   Status New     CC Long Term Goal  #3   Title Patient will improve functional gait assessment score to at least 27/30 for improved safety   Baseline score of 24/30 on evaluation   Time 4   Period Weeks   Status New     CC Long Term Goal  #4   Title Pt. will be able to  demonstrate erect posture with minimal forward head position for 30 second or longer assessment   Time 4   Period Weeks   Status New            Plan - 05/21/16 1631    Clinical Impression Statement This gentleman is known to this clinic from therapy approx. 2 years ago for neck swelling.  He returns today with referral for balance problems and nearly falling. He works out with a Physiological scientist and reports doing strengthening, but still has mild LE weakness.  His posture is significant for forward head, rounded shoulders, and kyphosis.  His neck still appears mildly lymphedematous.  He shows mild balance impairments with dynamic gait activities such as tandem walking, walking with head turns, and changing speeds with walking.  Eval is moderate complexity today with h/o tongue cancer, ADHD and memory problems, and evolving with worsening balance.   Rehab Potential Good   Clinical Impairments Affecting Rehab Potential some memory deficits reported on this patient   PT Frequency 2x / week   PT Duration 4 weeks   PT Treatment/Interventions ADLs/Self Care Home Management;Gait training;Stair training;Functional mobility training;Therapeutic exercise;Balance training;Neuromuscular re-education;Patient/family education;Manual lymph drainage;Manual techniques;Passive range of motion   PT Next Visit Plan Begin high level balance activities such as tandem stance and tandem gait; single leg  stance; stance with eyes closed; walking with head movements, etc.  Work on posture including neck and upper back flexibility and strengthening.  Include these in HEP.     Consulted and Agree with Plan of Care Patient      Patient will benefit from skilled therapeutic intervention in order to improve the following deficits and impairments:  Decreased balance, Abnormal gait, Increased edema, Decreased strength  Visit Diagnosis: Unsteadiness on feet - Plan: PT plan of care cert/re-cert  Abnormal posture - Plan: PT  plan of care cert/re-cert  Other abnormalities of gait and mobility - Plan: PT plan of care cert/re-cert      G-Codes - 17/61/60 1702    Functional Assessment Tool Used (Outpatient Only) clinical judgement   Functional Limitation Mobility: Walking and moving around   Mobility: Walking and Moving Around Current Status ([id number]) At least 1 percent but less than 20 percent impaired, limited or restricted   Mobility: Walking and Moving Around Goal Status [phone]) At least 1 percent but less than 20 percent impaired, limited or restricted       Problem List Patient Active Problem List   Diagnosis Date Noted  . Solitary pulmonary nodule on lung CT 05/18/2016  . Acquired hypothyroidism 05/18/2016  . Dry mouth 12/22/2014  . Mild cognitive impairment with memory loss 12/22/2014  . Balance disorder 09/22/2014  . 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 Dysuria 07/15/2014  . Dysphagia 06/17/2014  . 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 Cerebral ventriculomegaly 06/08/2014  . Alcohol abuse 06/08/2014  . Dilated pupil 06/04/2014  . Aspiration into airway   . Acute delirium 06/02/2014  . Tongue swelling   . Hyponatremia   . Protein-calorie malnutrition, severe ([place]) 05/26/2014  . Subacute delirium 05/25/2014  . 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 Confusion caused by a drug ([place]) 05/20/2014  . Protein calorie malnutrition ([place]) 05/20/2014  . Insomnia 05/10/2014  . Leukopenia due to antineoplastic chemotherapy ([place]) 04/28/2014  . Mucositis due to chemotherapy 04/22/2014  . Dehydration 04/22/2014  . Weight loss 04/14/2014  . Chemotherapy-induced nausea 04/14/2014  . 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 Malignant neoplasm of base of tongue ([place]) 03/17/2014  . TIA (transient ischemic attack) 12/29/2012  . Hyperlipemia   . ADHD (attention deficit hyperactivity disorder)     SALISBURY,DONNA 05/21/2016, 5:05 PM  [place], [place], [address] Phone: [phone]   Fax:  [phone]  Name: IVO MOGA MRN: [id number] Date of Birth: 26-Aug-1940  Serafina Royals, PT 05/21/16 5:05 PM

## 2016-05-23 ENCOUNTER — Ambulatory Visit: Payer: Medicare Other

## 2016-05-23 DIAGNOSIS — R2689 Other abnormalities of gait and mobility: Secondary | ICD-10-CM

## 2016-05-23 DIAGNOSIS — R293 Abnormal posture: Secondary | ICD-10-CM | POA: Diagnosis not present

## 2016-05-23 DIAGNOSIS — R2681 Unsteadiness on feet: Secondary | ICD-10-CM | POA: Diagnosis not present

## 2016-05-23 NOTE — Patient Instructions (Addendum)
DO THESE IN STANDING LEANING AGAINST WALL (SHOULDERS AND BUTT AGAINST WALL)     / START WALKING!!  Over Head Pull: Narrow and Wide Grip     Cancer Rehab [phone]   On back, knees bent, feet flat, band across thighs, elbows straight but relaxed. Pull hands apart (start). Keeping elbows straight, bring arms up and over head, hands toward floor. Keep pull steady on band. Hold momentarily. Return slowly, keeping pull steady, back to start. Then do same with a wider grip on the band (past shoulder width) Repeat _10__ times. Band color __RED____   Side Pull: Double Arm   On back, knees bent, feet flat. Arms perpendicular to body, shoulder level, elbows straight but relaxed. Pull arms out to sides, elbows straight. Resistance band comes across collarbones, hands toward floor. Hold momentarily. Slowly return to starting position. Repeat _10__ times. Band color _RED____   Sword   On back, knees bent, feet flat, left hand on left hip, right hand above left. Pull right arm DIAGONALLY (hip to shoulder) across chest. Bring right arm along head toward floor. Hold momentarily. Slowly return to starting position. Repeat _10__ times. Do with left arm. Band color _RED_____   Shoulder Rotation: Double Arm   On back, knees bent, feet flat, elbows tucked at sides, bent 90, hands palms up. Pull hands apart and down toward floor, keeping elbows near sides. Hold momentarily. Slowly return to starting position. Repeat _10__ times. Band color __RED____    Chair Sitting    Sit at edge of seat, spine straight, one leg extended. Put a hand on each thigh and bend forward from the hip, keeping spine straight. Allow hand on extended leg to reach toward toes. Support upper body with other arm. Hold _20__ seconds. Repeat _3__ times per session. Do _3-4__ sessions per day.  Copyright  VHI. All rights reserved.   Hip Stretch    Sit on chair, one foot on floor. Bend other leg and place ankle on knee. Keeping  back straight and looking forward, lean forward until stretch is felt. Hold _20__ seconds. Do _3_ repetitions, 3-4 times a day.  Copyright  VHI. All rights reserved.   Quads / HF, Standing    Stand, holding onto chair or wall and grasping one foot with same-side hand. Pull heel toward buttock keeping chest up. Hold _20__ seconds.  Repeat _3__ times per session. Do _3-4__ sessions per day.  Copyright  VHI. All rights reserved.

## 2016-05-23 NOTE — Therapy (Signed)
[place], [place], [address] Phone: [phone]   Fax:  [phone]  Physical Therapy Treatment  Patient Details  Name: Dylan Moreno MRN: [id number] Date of Birth: October 18, 1940 Referring Provider: Dr. Heath Lark  Encounter Date: 05/23/2016      PT End of Session - 05/23/16 1632    Visit Number 2   Number of Visits 9   Date for PT Re-Evaluation 06/29/16   PT Start Time 9417   PT Stop Time 1517   PT Time Calculation (min) 44 min   Activity Tolerance Patient tolerated treatment well   Behavior During Therapy [facility] for tasks assessed/performed      Past Medical History:  Diagnosis Date  . ADHD (attention deficit hyperactivity disorder)   . Anxiety   . Arthritis   . 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 Dysuria 07/15/2014  . GERD (gastroesophageal reflux disease)   . History of radiation therapy 04/06/14- 05/25/14   Base of tongue and bilateral neck 70 Gy in 35 fractions to gross disease, 63 Gy in 35 Fractions to high risk nodal echelons, and 56 Gy in 35 fractions to intermediate risk nodal echelons  . Hypercholesterolemia   . Hyperlipemia   . Hypertension   . Kidney stones   . Peyronie's disease   . TIA (transient ischemic attack)   . TIA (transient ischemic attack) 12/29/12   left facial droop  . Wears glasses     Past Surgical History:  Procedure Laterality Date  . COLONOSCOPY    . CYSTOSCOPY W/ URETEROSCOPY W/ LITHOTRIPSY  2011  . 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   Procedure: DUPUYTREN CONTRACTURE RELEASE RIGHT PALM,RING AND SMALL FINGER;  Surgeon: Cammie Sickle., MD;  Location: [place];  Service: Orthopedics;  Laterality: Right;  . HAND SURGERY     right  . SHOULDER ARTHROSCOPY W/ ROTATOR CUFF REPAIR     left  . TONSILLECTOMY      There were no vitals filed for this  visit.      Subjective Assessment - 05/23/16 1443    Subjective I'm really frustrated with how low my endurance is. I want that better.    Pertinent History Diagnosed 3/16 with cancer on base of tongue. He underwent chemotherapy and radiation and completed that on 05/25/14.  He reports swelling in his neck began after radiation around 06/02/14.  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   Patient Stated Goals I want to get back to normal.   Currently in Pain? No/denies                         [facility] Adult PT Treatment/Exercise - 05/23/16 0001      Neuro Re-ed    Neuro Re-ed Details  In // bars: Heel-toe tandem walking 4 times; crossover rt and Lt 2 times each; seated rest;  toe walking 2 times; high knee marching 4 times; side step squat walking 2 times to Rt side and 1 time to Lt as pt reported feeling tired and needing to stop.      Knee/Hip Exercises: Stretches   Passive Hamstring Stretch 2 reps;20 seconds;Both  Seated edge of chair   Quad Stretch 1 rep;10 seconds;Both  Fingertips on wall   Piriformis Stretch Both;2 reps;20 seconds  Seated edge of chair     Knee/Hip Exercises: Aerobic   Stationary Bike Level 2 x6 minutes     Shoulder Exercises: Standing   Horizontal ABduction Strengthening;Both;10 reps;Theraband   Theraband Level (Shoulder Horizontal ABduction) Level 2 (Red)   External Rotation Strengthening;Both;10 reps;Theraband   Theraband Level (Shoulder External Rotation) Level 2 (Red)   Flexion Strengthening;Both;10 reps;Theraband  Narrow and Wide Grip 10 times each   Theraband Level (Shoulder Flexion) Level 2 (Red)   Other Standing Exercises Bil D2 with red theraband 10 times each with VC for correct technique throughout                PT Education - 05/23/16 1521    Education provided Yes   Education Details Postural strength and hip  flexibility   Person(s) Educated Patient   Methods Explanation;Demonstration;Handout   Comprehension Verbalized understanding;Returned demonstration;Need further instruction                [facility] Goals - 05/21/16 1640      CC Long Term Goal  #1   Title Patient will be independent in home exercise program for gait and posture   Time 4   Period Weeks   Status New     CC Long Term Goal  #2   Title Patient will be knowledgeable about the importance of LE strengthening in his workouts with the trainer at his club   Time 4   Period Weeks   Status New     CC Long Term Goal  #3   Title Patient will improve functional gait assessment score to at least 27/30 for improved safety   Baseline score of 24/30 on evaluation   Time 4   Period Weeks   Status New     CC Long Term Goal  #4   Title Pt. will be able to demonstrate erect posture with minimal forward head position for 30 second or longer assessment   Time 4   Period Weeks   Status New            Plan - 05/23/16 1632    Clinical Impression Statement Pt tolerated session very well today, demonstrating mild fatigue by end of session, mostly in his LE's, per his report. Started him on a HEP today to focus on postural strength, hip flexibilty, and working towards a daily walking routine of 30 mins/day, but starting every other day maybe 15-20 mins and progressing as able. He did require mod VC's to remind him of correct posture while performing scapular series but was able to return correct demonstration.    Rehab Potential Good   Clinical Impairments Affecting Rehab Potential some memory deficits reported on this patient   PT Frequency 2x / week   PT Duration 4 weeks   PT Treatment/Interventions ADLs/Self Care Home Management;Gait training;Stair training;Functional mobility training;Therapeutic exercise;Balance training;Neuromuscular re-education;Patient/family education;Manual lymph drainage;Manual  techniques;Passive range of motion   PT Next Visit Plan Cont high level balance activities in // bars such as tandem stance and tandem gait; single leg stance; stance with eyes closed; walking with head movements, etc.  Cont working on posture including neck and upper back flexibility and strengthening/review scapular series against wall.  Include all instructions in HEP due to memory deficits.     PT Home Exercise Plan Postural strength, hip flexibility, and begin walking routine.    Consulted and Agree with Plan of Care Patient  Patient will benefit from skilled therapeutic intervention in order to improve the following deficits and impairments:  Decreased balance, Abnormal gait, Increased edema, Decreased strength  Visit Diagnosis: Unsteadiness on feet  Abnormal posture  Other abnormalities of gait and mobility     Problem List Patient Active Problem List   Diagnosis Date Noted  . Solitary pulmonary nodule on lung CT 05/18/2016  . Acquired hypothyroidism 05/18/2016  . Dry mouth 12/22/2014  . Mild cognitive impairment with memory loss 12/22/2014  . Balance disorder 09/22/2014  . 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 Dysuria 07/15/2014  . Dysphagia 06/17/2014  . Drug-induced memory loss ([place]) 06/17/2014  . Cerebral ventriculomegaly 06/08/2014  . Alcohol abuse 06/08/2014  . Dilated pupil 06/04/2014  . Aspiration into airway   . Acute delirium 06/02/2014  . Tongue swelling   . Hyponatremia   . Protein-calorie malnutrition, severe ([place]) 05/26/2014  . Subacute delirium 05/25/2014  . 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 Confusion caused by a drug ([place]) 05/20/2014  . Protein calorie malnutrition ([place]) 05/20/2014  . Insomnia 05/10/2014  . Leukopenia due to antineoplastic chemotherapy ([place]) 04/28/2014  . Mucositis due to chemotherapy 04/22/2014  . Dehydration 04/22/2014  . Weight loss 04/14/2014  . Chemotherapy-induced nausea 04/14/2014  . 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 Malignant neoplasm of base of tongue ([place]) 03/17/2014  . TIA (transient ischemic attack) 12/29/2012  . Hyperlipemia   . ADHD (attention deficit hyperactivity disorder)     Otelia Limes, PTA 05/23/2016, 4:39 PM  [place], [place], [address] Phone: [phone]   Fax:  [phone]  Name: Dylan Moreno MRN: [id number] Date of Birth: [birth year]

## 2016-05-30 ENCOUNTER — Ambulatory Visit: Payer: Medicare Other

## 2016-05-30 DIAGNOSIS — R293 Abnormal posture: Secondary | ICD-10-CM | POA: Diagnosis not present

## 2016-05-30 DIAGNOSIS — R2689 Other abnormalities of gait and mobility: Secondary | ICD-10-CM

## 2016-05-30 DIAGNOSIS — R2681 Unsteadiness on feet: Secondary | ICD-10-CM | POA: Diagnosis not present

## 2016-05-30 NOTE — Therapy (Signed)
[place], [place], [address] Phone: [phone]   Fax:  [phone]  Physical Therapy Treatment  Patient Details  Name: Dylan Moreno MRN: [id number] Date of Birth: February 12, 1940 Referring Provider: Dr. Heath Lark  Encounter Date: 05/30/2016      PT End of Session - 05/30/16 1517    Visit Number 3   Number of Visits 9   Date for PT Re-Evaluation 06/29/16   PT Start Time 2505   PT Stop Time 1517   PT Time Calculation (min) 40 min   Activity Tolerance Patient tolerated treatment well   Behavior During Therapy [facility] for tasks assessed/performed      Past Medical History:  Diagnosis Date  . ADHD (attention deficit hyperactivity disorder)   . Anxiety   . Arthritis   . 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 Dysuria 07/15/2014  . GERD (gastroesophageal reflux disease)   . History of radiation therapy 04/06/14- 05/25/14   Base of tongue and bilateral neck 70 Gy in 35 fractions to gross disease, 63 Gy in 35 Fractions to high risk nodal echelons, and 56 Gy in 35 fractions to intermediate risk nodal echelons  . Hypercholesterolemia   . Hyperlipemia   . Hypertension   . Kidney stones   . Peyronie's disease   . TIA (transient ischemic attack)   . TIA (transient ischemic attack) 12/29/12   left facial droop  . Wears glasses     Past Surgical History:  Procedure Laterality Date  . COLONOSCOPY    . CYSTOSCOPY W/ URETEROSCOPY W/ LITHOTRIPSY  2011  . 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   Procedure: DUPUYTREN CONTRACTURE RELEASE RIGHT PALM,RING AND SMALL FINGER;  Surgeon: Cammie Sickle., MD;  Location: [place];  Service: Orthopedics;  Laterality: Right;  . HAND SURGERY     right  . SHOULDER ARTHROSCOPY W/ ROTATOR CUFF REPAIR     left  . TONSILLECTOMY      There were no vitals filed for this  visit.      Subjective Assessment - 05/30/16 1445    Subjective I feel like I'm starting to slowly turn the corner with my endurance improving. I feel like I have more energy since I was here last. I'e started walking daily, but want to increase that. And I can do more during the day.    Pertinent History Diagnosed 3/16 with cancer on base of tongue. He underwent chemotherapy and radiation and completed that on 05/25/14.  He reports swelling in his neck began after radiation around 06/02/14.  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   Patient Stated Goals I want to get back to normal.   Currently in Pain? No/denies                         [facility] Adult PT Treatment/Exercise - 05/30/16 0001      Neuro Re-ed    Neuro Re-ed Details  In // bars: Heel-toe tandem walking 4 times; crossover Rt and Lt 2 times each; seated rest;  toe walking 3x2 times with VC full end ROM; high knee marching 4 times; side step squat walking 2 times to Rt side and 1 time to Lt as pt reported  feeling tired and needing to stop.      Knee/Hip Exercises: Aerobic   Nustep Level 6 x5 minutes     Knee/Hip Exercises: Standing   Hip Flexion Stengthening;Both;2 sets;10 reps;Knee straight  3 lbs each ankle   Hip Abduction Stengthening;Both;2 sets;10 reps;Knee straight  3 lbs each ankle   Hip Extension Stengthening;Left;2 sets;10 reps;Knee straight  3 lbs each ankle     Shoulder Exercises: Standing   Other Standing Exercises Bil UE 3 way raises with 2 lbs standing against wall (shoulders on wall and core engaged, unable to get head on wall) 2 x 10 each into flexion, scaption, and abduction to shoulder height; VC throughout for correct technique/postural reminders, seated rest in between sets                        [place] - 05/21/16 1640      CC Long Term Goal  #1   Title  Patient will be independent in home exercise program for gait and posture   Time 4   Period Weeks   Status New     CC Long Term Goal  #2   Title Patient will be knowledgeable about the importance of LE strengthening in his workouts with the trainer at his club   Time 4   Period Weeks   Status New     CC Long Term Goal  #3   Title Patient will improve functional gait assessment score to at least 27/30 for improved safety   Baseline score of 24/30 on evaluation   Time 4   Period Weeks   Status New     CC Long Term Goal  #4   Title Pt. will be able to demonstrate erect posture with minimal forward head position for 30 second or longer assessment   Time 4   Period Weeks   Status New            Plan - 05/30/16 1517    Clinical Impression Statement Pt continues to tolerate session very well and was able to add weights today as well and pt did well with this reporting feeling good after session. He is doing well progressing himself at home with walking routine and HEP and rpeorts over past week and has started noticing an improvement with his endurance.    Rehab Potential Good   Clinical Impairments Affecting Rehab Potential some memory deficits reported on this patient   PT Frequency 2x / week   PT Duration 4 weeks   PT Treatment/Interventions ADLs/Self Care Home Management;Gait training;Stair training;Functional mobility training;Therapeutic exercise;Balance training;Neuromuscular re-education;Patient/family education;Manual lymph drainage;Manual techniques;Passive range of motion   PT Next Visit Plan Cont high level balance activities in // bars such as tandem stance and tandem gait; single leg stance; stance with eyes closed; walking with head movements, etc.  Cont working on posture including neck and upper back flexibility and strengthening/review scapular series against wall.  Include all instructions in HEP due to memory deficits.     Consulted and Agree with Plan of Care  Patient      Patient will benefit from skilled therapeutic intervention in order to improve the following deficits and impairments:  Decreased balance, Abnormal gait, Increased edema, Decreased strength  Visit Diagnosis: Unsteadiness on feet  Abnormal posture  Other abnormalities of gait and mobility     Problem List Patient Active Problem List   Diagnosis Date Noted  . Solitary pulmonary nodule on lung  CT 05/18/2016  . Acquired hypothyroidism 05/18/2016  . Dry mouth 12/22/2014  . Mild cognitive impairment with memory loss 12/22/2014  . Balance disorder 09/22/2014  . 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 Dysuria 07/15/2014  . Dysphagia 06/17/2014  . 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 Cerebral ventriculomegaly 06/08/2014  . Alcohol abuse 06/08/2014  . Dilated pupil 06/04/2014  . Aspiration into airway   . Acute delirium 06/02/2014  . Tongue swelling   . Hyponatremia   . Protein-calorie malnutrition, severe ([place]) 05/26/2014  . Subacute delirium 05/25/2014  . 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 Confusion caused by a drug ([place]) 05/20/2014  . Protein calorie malnutrition ([place]) 05/20/2014  . Insomnia 05/10/2014  . Leukopenia due to antineoplastic chemotherapy ([place]) 04/28/2014  . Mucositis due to chemotherapy 04/22/2014  . Dehydration 04/22/2014  . Weight loss 04/14/2014  . Chemotherapy-induced nausea 04/14/2014  . 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 Malignant neoplasm of base of tongue ([place]) 03/17/2014  . TIA (transient ischemic attack) 12/29/2012  . Hyperlipemia   . ADHD (attention deficit hyperactivity disorder)     Otelia Limes, PTA 05/30/2016, 3:20 PM  [place], [place], [address] Phone: [phone]   Fax:  [phone]  Name: Dylan Moreno MRN: [id number] Date of Birth: August 14, 1940

## 2016-06-01 ENCOUNTER — Ambulatory Visit: Payer: Medicare Other | Attending: Hematology and Oncology | Admitting: Physical Therapy

## 2016-06-01 ENCOUNTER — Encounter: Payer: Self-pay | Admitting: Physical Therapy

## 2016-06-01 DIAGNOSIS — R2689 Other abnormalities of gait and mobility: Secondary | ICD-10-CM | POA: Insufficient documentation

## 2016-06-01 DIAGNOSIS — R293 Abnormal posture: Secondary | ICD-10-CM | POA: Diagnosis not present

## 2016-06-01 DIAGNOSIS — R2681 Unsteadiness on feet: Secondary | ICD-10-CM

## 2016-06-01 NOTE — Therapy (Signed)
[place], [place], [address] Phone: [phone]   Fax:  [phone]  Physical Therapy Treatment  Patient Details  Name: Dylan Moreno MRN: [id number] Date of Birth: [birth year] Referring Provider: Dr. Heath Lark  Encounter Date: 06/01/2016      PT End of Session - 06/01/16 1218    Visit Number 4   Number of Visits 9   Date for PT Re-Evaluation 06/29/16   PT Start Time 0940  pt arrived late   PT Stop Time 1016   PT Time Calculation (min) 36 min   Activity Tolerance Patient tolerated treatment well   Behavior During Therapy [facility] for tasks assessed/performed      Past Medical History:  Diagnosis Date  . ADHD (attention deficit hyperactivity disorder)   . Anxiety   . Arthritis   . 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 Dysuria 07/15/2014  . GERD (gastroesophageal reflux disease)   . History of radiation therapy 04/06/14- 05/25/14   Base of tongue and bilateral neck 70 Gy in 35 fractions to gross disease, 63 Gy in 35 Fractions to high risk nodal echelons, and 56 Gy in 35 fractions to intermediate risk nodal echelons  . Hypercholesterolemia   . Hyperlipemia   . Hypertension   . Kidney stones   . Peyronie's disease   . TIA (transient ischemic attack)   . TIA (transient ischemic attack) 12/29/12   left facial droop  . Wears glasses     Past Surgical History:  Procedure Laterality Date  . COLONOSCOPY    . CYSTOSCOPY W/ URETEROSCOPY W/ LITHOTRIPSY  2011  . 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   Procedure: DUPUYTREN CONTRACTURE RELEASE RIGHT PALM,RING AND SMALL FINGER;  Surgeon: Cammie Sickle., MD;  Location: [place];  Service: Orthopedics;  Laterality: Right;  . HAND SURGERY     right  . SHOULDER ARTHROSCOPY W/ ROTATOR CUFF REPAIR     left  . TONSILLECTOMY      There were no  vitals filed for this visit.      Subjective Assessment - 06/01/16 0944    Subjective I felt kinda wiped out after last session. I have noticed some improvements since I started therapy. I am still walking. Walking is getting easier.    Pertinent History Diagnosed 3/16 with cancer on base of tongue. He underwent chemotherapy and radiation and completed that on 05/25/14.  He reports swelling in his neck began after radiation around 06/02/14.  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   Patient Stated Goals I want to get back to normal.   Currently in Pain? No/denies   Pain Score 0-No pain                         [facility] Adult PT Treatment/Exercise - 06/01/16 0001      Neuro Re-ed    Neuro Re-ed Details  In // bars: Heel-toe tandem walking 4 times; crossover Rt and Lt 2 times each, more difficult when crossing over with left; seated rest;  toe walking 3x2 times with VC full end ROM; high knee marching 4 times; side step squat walking 2 times to Rt side and Lt sides     Knee/Hip Exercises:  Aerobic   Nustep Level 6 x6 minutes  O2 98, HR 87, pt felt dizzy after standing up                 PT Education - 06/01/16 1222    Education provided Yes   Education Details what different exercises were targeting, need to change position slowly, rest when fatigued   Person(s) Educated Patient   Methods Explanation   Comprehension Verbalized understanding                [facility] Goals - 06/01/16 1218      CC Long Term Goal  #1   Title Patient will be independent in home exercise program for gait and posture   Time 4   Period Weeks   Status On-going     CC Long Term Goal  #2   Title Patient will be knowledgeable about the importance of LE strengthening in his workouts with the trainer at his club   Time 4   Period Weeks   Status On-going     CC Long Term  Goal  #3   Title Patient will improve functional gait assessment score to at least 27/30 for improved safety   Baseline score of 24/30 on evaluation   Time 4   Period Weeks   Status On-going     CC Long Term Goal  #4   Title Pt. will be able to demonstrate erect posture with minimal forward head position for 30 second or longer assessment   Baseline measured today for a Jovi Pak   Time 4   Period Weeks   Status On-going            Plan - 06/01/16 1219    Clinical Impression Statement Pt did great with balance exercises today. He only required 1 hand for support and was able to do high knee marches without holding on to the parallel bars. He then completed 6 minutes on the NuStep. After this he stated he felt a little dizzy and fatigued. Stopped exercises at this point. O2 and HR were good.    Rehab Potential Good   Clinical Impairments Affecting Rehab Potential some memory deficits reported on this patient   PT Frequency 2x / week   PT Duration 4 weeks   PT Treatment/Interventions ADLs/Self Care Home Management;Gait training;Stair training;Functional mobility training;Therapeutic exercise;Balance training;Neuromuscular re-education;Patient/family education;Manual lymph drainage;Manual techniques;Passive range of motion   PT Next Visit Plan Cont high level balance activities in // bars such as tandem stance and tandem gait; single leg stance; stance with eyes closed; walking with head movements, etc.  Cont working on posture including neck and upper back flexibility and strengthening/review scapular series against wall.  Include all instructions in HEP due to memory deficits.     PT Home Exercise Plan Postural strength, hip flexibility, and begin walking routine.    Consulted and Agree with Plan of Care Patient      Patient will benefit from skilled therapeutic intervention in order to improve the following deficits and impairments:  Decreased balance, Abnormal gait, Increased edema,  Decreased strength  Visit Diagnosis: Unsteadiness on feet  Abnormal posture     Problem List Patient Active Problem List   Diagnosis Date Noted  . Solitary pulmonary nodule on lung CT 05/18/2016  . Acquired hypothyroidism 05/18/2016  . Dry mouth 12/22/2014  . Mild cognitive impairment with memory loss 12/22/2014  . Balance disorder 09/22/2014  . 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 Dysuria  07/15/2014  . Dysphagia 06/17/2014  . 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 Cerebral ventriculomegaly 06/08/2014  . Alcohol abuse 06/08/2014  . Dilated pupil 06/04/2014  . Aspiration into airway   . Acute delirium 06/02/2014  . Tongue swelling   . Hyponatremia   . Protein-calorie malnutrition, severe ([place]) 05/26/2014  . Subacute delirium 05/25/2014  . 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 Confusion caused by a drug ([place]) 05/20/2014  . Protein calorie malnutrition ([place]) 05/20/2014  . Insomnia 05/10/2014  . Leukopenia due to antineoplastic chemotherapy ([place]) 04/28/2014  . Mucositis due to chemotherapy 04/22/2014  . Dehydration 04/22/2014  . Weight loss 04/14/2014  . Chemotherapy-induced nausea 04/14/2014  . 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 Malignant neoplasm of base of tongue ([place]) 03/17/2014  . TIA (transient ischemic attack) 12/29/2012  . Hyperlipemia   . ADHD (attention deficit hyperactivity disorder)     Allyson Sabal [facility] 06/01/2016, 12:24 PM  [place], [place], [address] Phone: [phone]   Fax:  [phone]  Name: Dylan Moreno MRN: [id number] Date of Birth: 01/25/1940  Manus Gunning, PT 06/01/16 12:24 PM

## 2016-06-05 ENCOUNTER — Ambulatory Visit: Payer: Medicare Other

## 2016-06-05 DIAGNOSIS — R2681 Unsteadiness on feet: Secondary | ICD-10-CM | POA: Diagnosis not present

## 2016-06-05 DIAGNOSIS — R2689 Other abnormalities of gait and mobility: Secondary | ICD-10-CM | POA: Diagnosis not present

## 2016-06-05 DIAGNOSIS — R293 Abnormal posture: Secondary | ICD-10-CM

## 2016-06-05 NOTE — Therapy (Signed)
[place], [place], [address] Phone: [phone]   Fax:  [phone]  Physical Therapy Treatment  Patient Details  Name: Dylan Moreno MRN: [id number] Date of Birth: 27-Sep-1940 Referring Provider: Dr. Heath Lark  Encounter Date: 06/05/2016      PT End of Session - 06/05/16 1202    Visit Number 5   Number of Visits 9   Date for PT Re-Evaluation 06/29/16   PT Start Time 1107   PT Stop Time 1154   PT Time Calculation (min) 47 min   Activity Tolerance Patient tolerated treatment well   Behavior During Therapy [facility] for tasks assessed/performed      Past Medical History:  Diagnosis Date  . ADHD (attention deficit hyperactivity disorder)   . Anxiety   . Arthritis   . 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 Dysuria 07/15/2014  . GERD (gastroesophageal reflux disease)   . History of radiation therapy 04/06/14- 05/25/14   Base of tongue and bilateral neck 70 Gy in 35 fractions to gross disease, 63 Gy in 35 Fractions to high risk nodal echelons, and 56 Gy in 35 fractions to intermediate risk nodal echelons  . Hypercholesterolemia   . Hyperlipemia   . Hypertension   . Kidney stones   . Peyronie's disease   . TIA (transient ischemic attack)   . TIA (transient ischemic attack) 12/29/12   left facial droop  . Wears glasses     Past Surgical History:  Procedure Laterality Date  . COLONOSCOPY    . CYSTOSCOPY W/ URETEROSCOPY W/ LITHOTRIPSY  2011  . 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   Procedure: DUPUYTREN CONTRACTURE RELEASE RIGHT PALM,RING AND SMALL FINGER;  Surgeon: Cammie Sickle., MD;  Location: [place];  Service: Orthopedics;  Laterality: Right;  . HAND SURGERY     right  . SHOULDER ARTHROSCOPY W/ ROTATOR CUFF REPAIR     left  . TONSILLECTOMY      There were no vitals filed for this  visit.      Subjective Assessment - 06/05/16 1111    Subjective Was just a little tired after last session but not more than usual. I've been walking more and I feel like my endurance is definietly getting better.    Pertinent History Diagnosed 3/16 with cancer on base of tongue. He underwent chemotherapy and radiation and completed that on 05/25/14.  He reports swelling in his neck began after radiation around 06/02/14.  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   Patient Stated Goals I want to get back to normal.   Currently in Pain? No/denies                         [facility] Adult PT Treatment/Exercise - 06/05/16 0001      Neuro Re-ed    Neuro Re-ed Details  In // bars: Heel-toe tandem walking 4 times; retrotandem 2 times crossover Rt and Lt 2 times each, more difficult when crossing over with left; seated rest;  toe walking 3x2 times with VC full end ROM; high knee marching 4 times; side step squat walking 2 times to Rt side and Lt sides. Then standing on Airex : Bil tandem stance for 30 seconds,  squats 10 times seated rest after, then bil SLS practie 2x30 seconds each     Knee/Hip Exercises: Aerobic   Nustep Level 6 x 7 minutes                PT Education - 06/05/16 1200    Education provided Yes   Education Details Pt reports hasn't been doing his postural exercises so reviewed with pt importance of postural exercises so they can help improve his balance as it will allow him to have more erect, safe posture.    Person(s) Educated Patient   Methods Explanation   Comprehension Verbalized understanding                [facility] Goals - 06/01/16 1218      CC Long Term Goal  #1   Title Patient will be independent in home exercise program for gait and posture   Time 4   Period Weeks   Status On-going     CC Long Term Goal  #2   Title Patient  will be knowledgeable about the importance of LE strengthening in his workouts with the trainer at his club   Time 4   Period Weeks   Status On-going     CC Long Term Goal  #3   Title Patient will improve functional gait assessment score to at least 27/30 for improved safety   Baseline score of 24/30 on evaluation   Time 4   Period Weeks   Status On-going     CC Long Term Goal  #4   Title Pt. will be able to demonstrate erect posture with minimal forward head position for 30 second or longer assessment   Baseline measured today for a Jovi Pak   Time 4   Period Weeks   Status On-going            Plan - 06/05/16 1202    Clinical Impression Statement Pt continues to tolerate high level balance activities well. Progressed these today to include standing on unlevel surface (Airex) for some static balance training. He reported this challenging and felt good to do. He has been consistent with walking routine but not postural exercises so discussed this with him today at end of session and importance of resuming these as this will also help his balance to help him have more of an erect posture with gait. He did not feel the need to review them but reports he will begin doing them again, just got lazy and did not want to was his response. No c/o dizziness today with NuStep.    Rehab Potential Good   Clinical Impairments Affecting Rehab Potential some memory deficits reported on this patient   PT Frequency 2x / week   PT Duration 4 weeks   PT Treatment/Interventions ADLs/Self Care Home Management;Gait training;Stair training;Functional mobility training;Therapeutic exercise;Balance training;Neuromuscular re-education;Patient/family education;Manual lymph drainage;Manual techniques;Passive range of motion   PT Next Visit Plan Cont high level balance activities in // bars such as tandem gait; cont tandem stance and single leg stance on Airex; stance with eyes closed; walking with head movements,  etc.  Cont working on posture including neck and upper back flexibility and strengthening/review scapular series against wall.  Include all instructions in HEP due to memory deficits.     PT Home Exercise Plan Postural strength, hip flexibility, and cont walking routine.    Consulted and Agree with Plan of Care Patient      Patient will benefit from skilled  therapeutic intervention in order to improve the following deficits and impairments:  Decreased balance, Abnormal gait, Increased edema, Decreased strength  Visit Diagnosis: Unsteadiness on feet  Abnormal posture  Other abnormalities of gait and mobility     Problem List Patient Active Problem List   Diagnosis Date Noted  . Solitary pulmonary nodule on lung CT 05/18/2016  . Acquired hypothyroidism 05/18/2016  . Dry mouth 12/22/2014  . Mild cognitive impairment with memory loss 12/22/2014  . Balance disorder 09/22/2014  . 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 Dysuria 07/15/2014  . Dysphagia 06/17/2014  . 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 Cerebral ventriculomegaly 06/08/2014  . Alcohol abuse 06/08/2014  . Dilated pupil 06/04/2014  . Aspiration into airway   . Acute delirium 06/02/2014  . Tongue swelling   . Hyponatremia   . Protein-calorie malnutrition, severe ([place]) 05/26/2014  . Subacute delirium 05/25/2014  . 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 Confusion caused by a drug ([place]) 05/20/2014  . Protein calorie malnutrition ([place]) 05/20/2014  . Insomnia 05/10/2014  . Leukopenia due to antineoplastic chemotherapy ([place]) 04/28/2014  . Mucositis due to chemotherapy 04/22/2014  . Dehydration 04/22/2014  . Weight loss 04/14/2014  . Chemotherapy-induced nausea 04/14/2014  . 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 Malignant neoplasm of base of tongue ([place]) 03/17/2014  . TIA (transient ischemic attack) 12/29/2012  . Hyperlipemia   . ADHD (attention deficit hyperactivity disorder)     Otelia Limes, PTA 06/05/2016, 12:08 PM  [place], [place], [address] Phone: [phone]   Fax:  [phone]  Name: Dylan Moreno MRN: [id number] Date of Birth: 1940/06/07

## 2016-06-07 ENCOUNTER — Other Ambulatory Visit: Payer: Self-pay | Admitting: Hematology and Oncology

## 2016-06-07 ENCOUNTER — Telehealth: Payer: Self-pay | Admitting: *Deleted

## 2016-06-07 ENCOUNTER — Encounter (HOSPITAL_COMMUNITY): Payer: Self-pay

## 2016-06-07 ENCOUNTER — Ambulatory Visit (HOSPITAL_COMMUNITY)
Admission: RE | Admit: 2016-06-07 | Discharge: 2016-06-07 | Disposition: A | Discharge status: Home / Self Care | Payer: Medicare Other | Source: Ambulatory Visit | Attending: Hematology and Oncology | Admitting: Hematology and Oncology

## 2016-06-07 DIAGNOSIS — R911 Solitary pulmonary nodule: Secondary | ICD-10-CM

## 2016-06-07 DIAGNOSIS — R918 Other nonspecific abnormal finding of lung field: Secondary | ICD-10-CM | POA: Diagnosis not present

## 2016-06-07 DIAGNOSIS — E039 Hypothyroidism, unspecified: Secondary | ICD-10-CM

## 2016-06-07 DIAGNOSIS — C01 Malignant neoplasm of base of tongue: Secondary | ICD-10-CM

## 2016-06-07 MED ORDER — IOPAMIDOL (ISOVUE-300) INJECTION 61%
75.0000 mL | Freq: Once | INTRAVENOUS | Status: AC | PRN
  Administered 2016-06-07: 75 mL via INTRAVENOUS

## 2016-06-07 NOTE — Telephone Encounter (Signed)
-----   Message from Heath Lark, MD sent at 06/07/2016  3:34 PM EDT ----- Regarding: Ct result Pls call him and let him know CT is ok I will plan to recheck again in 1 year ----- Message ----- From: Interface, Rad Results In Sent: 06/07/2016   3:27 PM To: Heath Lark, MD

## 2016-06-07 NOTE — Telephone Encounter (Signed)
As noted below by Dr. Alvy Bimler, I informed patient of his CT scan results. Also, she will recheck again in one year. Patient verbalized understanding.

## 2016-06-08 ENCOUNTER — Ambulatory Visit: Payer: Medicare Other | Admitting: Physical Therapy

## 2016-06-08 DIAGNOSIS — R2681 Unsteadiness on feet: Secondary | ICD-10-CM

## 2016-06-08 DIAGNOSIS — R2689 Other abnormalities of gait and mobility: Secondary | ICD-10-CM | POA: Diagnosis not present

## 2016-06-08 DIAGNOSIS — R293 Abnormal posture: Secondary | ICD-10-CM

## 2016-06-08 NOTE — Therapy (Signed)
[place], [place], [address] Phone: [phone]   Fax:  [phone]  Physical Therapy Treatment  Patient Details  Name: Dylan Moreno MRN: [id number] Date of Birth: 12-Oct-1940 Referring Provider: Dr. Heath Lark  Encounter Date: 06/08/2016      PT End of Session - 06/08/16 1210    Visit Number 6   Number of Visits 9   Date for PT Re-Evaluation 06/29/16   PT Start Time 1105   PT Stop Time 1155   PT Time Calculation (min) 50 min   Activity Tolerance Patient tolerated treatment well   Behavior During Therapy [facility] for tasks assessed/performed      Past Medical History:  Diagnosis Date  . ADHD (attention deficit hyperactivity disorder)   . Anxiety   . Arthritis   . 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 Dysuria 07/15/2014  . GERD (gastroesophageal reflux disease)   . History of radiation therapy 04/06/14- 05/25/14   Base of tongue and bilateral neck 70 Gy in 35 fractions to gross disease, 63 Gy in 35 Fractions to high risk nodal echelons, and 56 Gy in 35 fractions to intermediate risk nodal echelons  . Hypercholesterolemia   . Hyperlipemia   . Hypertension   . Kidney stones   . Peyronie's disease   . TIA (transient ischemic attack)   . TIA (transient ischemic attack) 12/29/12   left facial droop  . Wears glasses     Past Surgical History:  Procedure Laterality Date  . COLONOSCOPY    . CYSTOSCOPY W/ URETEROSCOPY W/ LITHOTRIPSY  2011  . 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   Procedure: DUPUYTREN CONTRACTURE RELEASE RIGHT PALM,RING AND SMALL FINGER;  Surgeon: Cammie Sickle., MD;  Location: [place];  Service: Orthopedics;  Laterality: Right;  . HAND SURGERY     right  . SHOULDER ARTHROSCOPY W/ ROTATOR CUFF REPAIR     left  . TONSILLECTOMY      There were no vitals filed for this  visit.      Subjective Assessment - 06/08/16 1109    Subjective "I think there's change but I don't know that there's a lot."  I still don't have a lot of energy.   Currently in Pain? No/denies                         [facility] Adult PT Treatment/Exercise - 06/08/16 0001      Neuro Re-ed    Neuro Re-ed Details  In // bars:  Heel-toe tandem walk x 6 lengths forward, then x 2 lengths backwards; then right crossovers and left crossovers x 2 lengths each; then samba walk x 2 lengths each way.       Neck Exercises: Seated   Neck Retraction 20 reps  vs. red Theraband trying to keep other muscles quiet     Knee/Hip Exercises: Standing   Heel Raises Both;1 second  30 reps, 2 sets   Forward Lunges Limitations In // bars, 8 lengths without UE support   Abduction Limitations In // bars: red Theraband around legs, sideways walking in slight squat keeping tension on band x 8 lengths back and forth.   Functional Squat 20 reps;3 sets  in // bars but hands free   Other Standing Knee Exercises pushing weighted sled without added weights approx. 40 feet  x 2  he was too tired to try more than that     Shoulder Exercises: Seated   Horizontal ABduction Strengthening;Both;20 reps;Theraband;10 reps   Theraband Level (Shoulder Horizontal ABduction) Level 2 (Red)     Shoulder Exercises: Standing   Row Strengthening;Both;20 reps;10 reps;Theraband   Theraband Level (Shoulder Row) Level 2 (Red)                        [facility] Goals - 06/01/16 1218      CC Long Term Goal  #1   Title Patient will be independent in home exercise program for gait and posture   Time 4   Period Weeks   Status On-going     CC Long Term Goal  #2   Title Patient will be knowledgeable about the importance of LE strengthening in his workouts with the trainer at his club   Time 4   Period Weeks   Status On-going     CC Long Term Goal  #3   Title Patient will improve functional gait  assessment score to at least 27/30 for improved safety   Baseline score of 24/30 on evaluation   Time 4   Period Weeks   Status On-going     CC Long Term Goal  #4   Title Pt. will be able to demonstrate erect posture with minimal forward head position for 30 second or longer assessment   Baseline measured today for a Jovi Pak   Time 4   Period Weeks   Status On-going            Plan - 06/08/16 1210    Clinical Impression Statement Pt. was tired at end of session and didn't want to try any additional exercise today.  He did work up a sweat with what we did.  Focus today was on strength in legs and postural muscles as well as on balance.   Rehab Potential Good   Clinical Impairments Affecting Rehab Potential some memory deficits reported on this patient   PT Frequency 2x / week   PT Duration 4 weeks   PT Treatment/Interventions ADLs/Self Care Home Management;Gait training;Stair training;Functional mobility training;Therapeutic exercise;Balance training;Neuromuscular re-education;Patient/family education;Manual lymph drainage;Manual techniques;Passive range of motion   PT Next Visit Plan Cont high level balance activities in // bars such as tandem gait; cont tandem stance and single leg stance on Airex; stance with eyes closed; walking with head movements, etc.  Cont working on posture including neck and upper back flexibility and strengthening/review scapular series against wall.  Include all instructions in HEP due to memory deficits.  Also include LE strengthening and endurance work.   PT Home Exercise Plan Postural strength, hip flexibility, and cont walking routine.    Consulted and Agree with Plan of Care Patient      Patient will benefit from skilled therapeutic intervention in order to improve the following deficits and impairments:  Decreased balance, Abnormal gait, Increased edema, Decreased strength  Visit Diagnosis: Unsteadiness on feet  Abnormal posture  Other  abnormalities of gait and mobility     Problem List Patient Active Problem List   Diagnosis Date Noted  . Solitary pulmonary nodule on lung CT 05/18/2016  . Acquired hypothyroidism 05/18/2016  . Dry mouth 12/22/2014  . Mild cognitive impairment with memory loss 12/22/2014  . Balance disorder 09/22/2014  . 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 Dysuria 07/15/2014  . Dysphagia 06/17/2014  . 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 Cerebral ventriculomegaly 06/08/2014  . Alcohol abuse 06/08/2014  . Dilated pupil 06/04/2014  . Aspiration into airway   . Acute delirium 06/02/2014  . Tongue swelling   . Hyponatremia   . Protein-calorie malnutrition, severe ([place]) 05/26/2014  . Subacute delirium 05/25/2014  . 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 Confusion caused by a drug ([place]) 05/20/2014  . Protein calorie malnutrition ([place]) 05/20/2014  . Insomnia 05/10/2014  . Leukopenia due to antineoplastic chemotherapy ([place]) 04/28/2014  . Mucositis due to chemotherapy 04/22/2014  . Dehydration 04/22/2014  . Weight loss 04/14/2014  . Chemotherapy-induced nausea 04/14/2014  . 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 Malignant neoplasm of base of tongue ([place]) 03/17/2014  . TIA (transient ischemic attack) 12/29/2012  . Hyperlipemia   . ADHD (attention deficit hyperactivity disorder)     Jamekia Gannett 06/08/2016, 12:14 PM  [place], [place], [address] Phone: [phone]   Fax:  [phone]  Name: Dylan Moreno MRN: [id number] Date of Birth: [birth year]  Serafina Royals, PT 06/08/16 12:14 PM

## 2016-06-11 ENCOUNTER — Ambulatory Visit: Payer: Medicare Other | Admitting: Physical Therapy

## 2016-06-11 DIAGNOSIS — R2689 Other abnormalities of gait and mobility: Secondary | ICD-10-CM

## 2016-06-11 DIAGNOSIS — R2681 Unsteadiness on feet: Secondary | ICD-10-CM | POA: Diagnosis not present

## 2016-06-11 DIAGNOSIS — R293 Abnormal posture: Secondary | ICD-10-CM | POA: Diagnosis not present

## 2016-06-11 NOTE — Therapy (Signed)
[place], [place], [address] Phone: [phone]   Fax:  [phone]  Physical Therapy Treatment  Patient Details  Name: Dylan Moreno MRN: [id number] Date of Birth: May 18, 1940 Referring Provider: Dr. Heath Lark  Encounter Date: 06/11/2016      PT End of Session - 06/11/16 1712    Visit Number 7   Number of Visits 9   Date for PT Re-Evaluation 06/29/16   PT Start Time 1520   PT Stop Time 1605   PT Time Calculation (min) 45 min   Activity Tolerance Patient tolerated treatment well   Behavior During Therapy [facility] for tasks assessed/performed      Past Medical History:  Diagnosis Date  . ADHD (attention deficit hyperactivity disorder)   . Anxiety   . Arthritis   . 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 Dysuria 07/15/2014  . GERD (gastroesophageal reflux disease)   . History of radiation therapy 04/06/14- 05/25/14   Base of tongue and bilateral neck 70 Gy in 35 fractions to gross disease, 63 Gy in 35 Fractions to high risk nodal echelons, and 56 Gy in 35 fractions to intermediate risk nodal echelons  . Hypercholesterolemia   . Hyperlipemia   . Hypertension   . Kidney stones   . Peyronie's disease   . TIA (transient ischemic attack)   . TIA (transient ischemic attack) 12/29/12   left facial droop  . Wears glasses     Past Surgical History:  Procedure Laterality Date  . COLONOSCOPY    . CYSTOSCOPY W/ URETEROSCOPY W/ LITHOTRIPSY  2011  . 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   Procedure: DUPUYTREN CONTRACTURE RELEASE RIGHT PALM,RING AND SMALL FINGER;  Surgeon: Cammie Sickle., MD;  Location: [place];  Service: Orthopedics;  Laterality: Right;  . HAND SURGERY     right  . SHOULDER ARTHROSCOPY W/ ROTATOR CUFF REPAIR     left  . TONSILLECTOMY      There were no vitals filed for this  visit.      Subjective Assessment - 06/11/16 1438    Subjective Got tired and a little sore, but nothing out of the ordinary after last session. I feel pretty good.  I think it's doing some good.   Currently in Pain? No/denies                         [facility] Adult PT Treatment/Exercise - 06/11/16 0001      Neuro Re-ed    Neuro Re-ed Details  Standing on BOSU ball with back in the corner, worked on maintaining balance on both feet x 4 mins.; single leg stance with feet on floor, back in corner, holding as long as able for many repetitions on both sides.  tandem stance with back in corner just trying to hold balance with each foot in front for a couple of minutes.     Knee/Hip Exercises: Aerobic   Recumbent Bike L2 x 8 mins.     Knee/Hip Exercises: Standing   Heel Raises Both;1 second  30 reps, 3 sets   Forward Lunges Limitations Using treadmill handles for minimal support, 20 reps x 2 sets leading with each foot.   Abduction Limitations In treadmill to use hand rail for support, sidestep and lunge with red Theraband around ankles x 20 each way.  Lateral Step Up Hand Hold: 1;Right;Left;10 reps;20 reps;Step Height: 6"                        [facility] Goals - 06/01/16 1218      CC Long Term Goal  #1   Title Patient will be independent in home exercise program for gait and posture   Time 4   Period Weeks   Status On-going     CC Long Term Goal  #2   Title Patient will be knowledgeable about the importance of LE strengthening in his workouts with the trainer at his club   Time 4   Period Weeks   Status On-going     CC Long Term Goal  #3   Title Patient will improve functional gait assessment score to at least 27/30 for improved safety   Baseline score of 24/30 on evaluation   Time 4   Period Weeks   Status On-going     CC Long Term Goal  #4   Title Pt. will be able to demonstrate erect posture with minimal forward head position for 30  second or longer assessment   Baseline measured today for a Jovi Pak   Time 4   Period Weeks   Status On-going            Plan - 06/11/16 1712    Clinical Impression Statement Today the patient reports he does feel like the therapy is helping.  He didn't note particular soreness after vigorous session last time.  He was able to stand up straighter, but still required cueing for that, today. He found the BOSU ball particularly challenging.   Rehab Potential Good   Clinical Impairments Affecting Rehab Potential some memory deficits reported on this patient   PT Frequency 2x / week   PT Duration 4 weeks   PT Treatment/Interventions ADLs/Self Care Home Management;Gait training;Stair training;Functional mobility training;Therapeutic exercise;Balance training;Neuromuscular re-education;Patient/family education;Manual lymph drainage;Manual techniques;Passive range of motion   PT Next Visit Plan Cont high level balance activities in // bars such as tandem gait; cont tandem stance and single leg stance on Airex; stance with eyes closed; walking with head movements, etc.  Cont working on posture including neck and upper back flexibility and strengthening/review scapular series against wall.  Include all instructions in HEP due to memory deficits.  Also include LE strengthening and endurance work.   PT Home Exercise Plan Postural strength, hip flexibility, and cont walking routine.    Consulted and Agree with Plan of Care Patient      Patient will benefit from skilled therapeutic intervention in order to improve the following deficits and impairments:  Decreased balance, Abnormal gait, Increased edema, Decreased strength  Visit Diagnosis: Unsteadiness on feet  Abnormal posture  Other abnormalities of gait and mobility     Problem List Patient Active Problem List   Diagnosis Date Noted  . Solitary pulmonary nodule on lung CT 05/18/2016  . Acquired hypothyroidism 05/18/2016  . Dry mouth  12/22/2014  . Mild cognitive impairment with memory loss 12/22/2014  . Balance disorder 09/22/2014  . 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 Dysuria 07/15/2014  . Dysphagia 06/17/2014  . 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 Cerebral ventriculomegaly 06/08/2014  . Alcohol abuse 06/08/2014  . Dilated pupil 06/04/2014  . Aspiration into airway   . Acute delirium 06/02/2014  . Tongue swelling   . Hyponatremia   . Protein-calorie malnutrition, severe ([place]) 05/26/2014  . Subacute  delirium 05/25/2014  . 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 Confusion caused by a drug ([place]) 05/20/2014  . Protein calorie malnutrition ([place]) 05/20/2014  . Insomnia 05/10/2014  . Leukopenia due to antineoplastic chemotherapy ([place]) 04/28/2014  . Mucositis due to chemotherapy 04/22/2014  . Dehydration 04/22/2014  . Weight loss 04/14/2014  . Chemotherapy-induced nausea 04/14/2014  . 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 Malignant neoplasm of base of tongue ([place]) 03/17/2014  . TIA (transient ischemic attack) 12/29/2012  . Hyperlipemia   . ADHD (attention deficit hyperactivity disorder)     SALISBUR[place],DONNA 06/11/2016, 5:15 PM  [place], [place], [address] Phone: [phone]   Fax:  [phone]  Name: Dylan Moreno MRN: [id number] Date of Birth: Jun 14, 1940  Serafina Royals, PT 06/11/16 5:15 PM

## 2016-06-13 ENCOUNTER — Ambulatory Visit (INDEPENDENT_AMBULATORY_CARE_PROVIDER_SITE_OTHER): Payer: Medicare Other | Admitting: Neurology

## 2016-06-13 ENCOUNTER — Ambulatory Visit: Payer: Medicare Other | Admitting: Physical Therapy

## 2016-06-13 ENCOUNTER — Encounter: Payer: Self-pay | Admitting: Neurology

## 2016-06-13 VITALS — BP 110/70 | HR 80 | Ht 73.0 in | Wt 165.5 lb

## 2016-06-13 DIAGNOSIS — G621 Alcoholic polyneuropathy: Secondary | ICD-10-CM | POA: Diagnosis not present

## 2016-06-13 DIAGNOSIS — R293 Abnormal posture: Secondary | ICD-10-CM | POA: Diagnosis not present

## 2016-06-13 DIAGNOSIS — G3184 Mild cognitive impairment, so stated: Secondary | ICD-10-CM | POA: Diagnosis not present

## 2016-06-13 DIAGNOSIS — R2681 Unsteadiness on feet: Secondary | ICD-10-CM

## 2016-06-13 DIAGNOSIS — R2689 Other abnormalities of gait and mobility: Secondary | ICD-10-CM | POA: Diagnosis not present

## 2016-06-13 NOTE — Therapy (Signed)
[place], [place], [address] Phone: [phone]   Fax:  [phone]  Physical Therapy Treatment  Patient Details  Name: Dylan Moreno MRN: [id number] Date of Birth: 15-Mar-1940 Referring Provider: Dr. Heath Lark  Encounter Date: 06/13/2016      PT End of Session - 06/13/16 1712    Visit Number 8   Number of Visits 9   Date for PT Re-Evaluation 06/29/16   PT Start Time 4008   PT Stop Time 1429   PT Time Calculation (min) 40 min   Activity Tolerance Patient tolerated treatment well   Behavior During Therapy [facility] for tasks assessed/performed      Past Medical History:  Diagnosis Date  . ADHD (attention deficit hyperactivity disorder)   . Anxiety   . Arthritis   . 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 Dysuria 07/15/2014  . GERD (gastroesophageal reflux disease)   . History of radiation therapy 04/06/14- 05/25/14   Base of tongue and bilateral neck 70 Gy in 35 fractions to gross disease, 63 Gy in 35 Fractions to high risk nodal echelons, and 56 Gy in 35 fractions to intermediate risk nodal echelons  . Hypercholesterolemia   . Hyperlipemia   . Hypertension   . Kidney stones   . Peyronie's disease   . TIA (transient ischemic attack)   . TIA (transient ischemic attack) 12/29/12   left facial droop  . Wears glasses     Past Surgical History:  Procedure Laterality Date  . COLONOSCOPY    . CYSTOSCOPY W/ URETEROSCOPY W/ LITHOTRIPSY  2011  . 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   Procedure: DUPUYTREN CONTRACTURE RELEASE RIGHT PALM,RING AND SMALL FINGER;  Surgeon: Cammie Sickle., MD;  Location: [place];  Service: Orthopedics;  Laterality: Right;  . HAND SURGERY     right  . SHOULDER ARTHROSCOPY W/ ROTATOR CUFF REPAIR     left  . TONSILLECTOMY      There were no vitals filed for this  visit.      Subjective Assessment - 06/13/16 1351    Subjective "I've already worked out today.  I ain't hard-charging."  Everything felt fine after Monday. I think I have a bit more energy.  I'm more responsive quickly--getting up, sitting down, getting back to my natural hyper way of being.   Currently in Pain? No/denies                         [facility] Adult PT Treatment/Exercise - 06/13/16 0001      Neuro Re-ed    Neuro Re-ed Details  Standing on BOSU ball with back in the corner, worked on maintaining balance on both feet x 4 mins.     Knee/Hip Exercises: Aerobic   Elliptical quick start x 2 mins.  couldn't do 3 mins.   Recumbent Bike L3 x 10 mins.     Knee/Hip Exercises: Standing   Abduction Limitations In treadmill to use hand rail for support, sidestep and lunge with red Theraband around ankles x 20 each way.   Lateral Step Up Right;Left;20 reps;Hand Hold: 1;Step Height: 8"     Shoulder Exercises: Standing   Horizontal ABduction Strengthening;Both;10 reps;Theraband  with back against ball against wall   Theraband Level (Shoulder Horizontal ABduction) Level 3 (Green)   Other Standing Exercises back against  ball against wall, active D2 vs. green Theraband x 10 each side                        Long Term Clinic Goals - 06/01/16 1218      CC Long Term Goal  #1   Title Patient will be independent in home exercise program for gait and posture   Time 4   Period Weeks   Status On-going     CC Long Term Goal  #2   Title Patient will be knowledgeable about the importance of LE strengthening in his workouts with the trainer at his club   Time 4   Period Weeks   Status On-going     CC Long Term Goal  #3   Title Patient will improve functional gait assessment score to at least 27/30 for improved safety   Baseline score of 24/30 on evaluation   Time 4   Period Weeks   Status On-going     CC Long Term Goal  #4   Title Pt. will be able to  demonstrate erect posture with minimal forward head position for 30 second or longer assessment   Baseline measured today for a Jovi Pak   Time 4   Period Weeks   Status On-going            Plan - 06/13/16 1712    Clinical Impression Statement Pt. does think he feels more energetic.  In therapy, he does a workout with little rest time and has been able to increase repetitions and resistance.   Rehab Potential Good   Clinical Impairments Affecting Rehab Potential some memory deficits reported on this patient   PT Frequency 2x / week   PT Duration 4 weeks   PT Treatment/Interventions ADLs/Self Care Home Management;Gait training;Stair training;Functional mobility training;Therapeutic exercise;Balance training;Neuromuscular re-education;Patient/family education;Manual lymph drainage;Manual techniques;Passive range of motion   PT Next Visit Plan He will be due for renewal next visit; check goals and discuss with him if he wants to continue.  Cont high level balance activities in // bars such as tandem gait; cont tandem stance and single leg stance on Airex; stance with eyes closed; walking with head movements, etc.  Cont working on posture including neck and upper back flexibility and strengthening/review scapular series against wall.  Include all instructions in HEP due to memory deficits.  Also include LE strengthening and endurance work.   PT Home Exercise Plan Postural strength, hip flexibility, and cont walking routine.    Consulted and Agree with Plan of Care Patient      Patient will benefit from skilled therapeutic intervention in order to improve the following deficits and impairments:  Decreased balance, Abnormal gait, Increased edema, Decreased strength  Visit Diagnosis: Unsteadiness on feet  Abnormal posture  Other abnormalities of gait and mobility     Problem List Patient Active Problem List   Diagnosis Date Noted  . Solitary pulmonary nodule on lung CT 05/18/2016  .  Acquired hypothyroidism 05/18/2016  . Dry mouth 12/22/2014  . Mild cognitive impairment with memory loss 12/22/2014  . Balance disorder 09/22/2014  . 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 Dysuria 07/15/2014  . Dysphagia 06/17/2014  . 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 Cerebral ventriculomegaly 06/08/2014  . Alcohol abuse 06/08/2014  . Dilated pupil 06/04/2014  . Aspiration into airway   . Acute delirium 06/02/2014  . Tongue swelling   . Hyponatremia   . Protein-calorie malnutrition, severe ([place])  05/26/2014  . Subacute delirium 05/25/2014  . 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 Confusion caused by a drug ([place]) 05/20/2014  . Protein calorie malnutrition ([place]) 05/20/2014  . Insomnia 05/10/2014  . Leukopenia due to antineoplastic chemotherapy ([place]) 04/28/2014  . Mucositis due to chemotherapy 04/22/2014  . Dehydration 04/22/2014  . Weight loss 04/14/2014  . Chemotherapy-induced nausea 04/14/2014  . 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 Malignant neoplasm of base of tongue ([place]) 03/17/2014  . TIA (transient ischemic attack) 12/29/2012  . Hyperlipemia   . ADHD (attention deficit hyperactivity disorder)     Mabry Tift 06/13/2016, 5:15 PM  [place], [place], [address] Phone: [phone]   Fax:  [phone]  Name: MIKA ANASTASI MRN: [id number] Date of Birth: [birth year]  Serafina Royals, PT 06/13/16 5:15 PM

## 2016-06-13 NOTE — Patient Instructions (Signed)
Return to clinic in March 2019

## 2016-06-13 NOTE — Progress Notes (Signed)
Follow-up Visit   Date: 06/13/16    Dylan Moreno MRN: [id number] DOB: [birth year]   Interim History: Dylan Moreno is a 76 y.o. right-handed Caucasian male with history of hyperlipidemia, tobacco use, ADD, TIA (12/29/2012), squamous cell carcinoma s/p chemo/radiation (Spring 2016) returning to the clinic for complaints of memory impairment.  The patient was accompanied to the clinic by wife.  History of present illness: He presented to the Emergency Department on 12/29/2012 with acute onset of left facial droop, lasting one hour. He woke up that morning feeling dizzy and lightheaded. His son, who is a Agricultural consultant, noticed his facial droop and brought him to the ED for evaluation. MRI/A of the brain did not show acute stroke. There was a note of ventriculomegaly suggestive of NPH. US carotids, echo, and holter was normal. He was previously taking lipitor 10mg  and was taking aspirin 81mg  daily. He was discharged on aspirin 325mg .  However, upon follow-up visit with me, I switched him to plavix 75mg  as he was already taking aspirin.  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 He was admitted to [facility] 5/45 - 06/03/2014 because of agitation, hallucinations, and delirium which had been ongoing for a week prior to presentation and thought to be due to combination of pain medications.  He also stopped taking Adderal one week prior to symptom onset  While hospitalized, his pain medications were slowly reduced and he started becoming less agitated.  He was discharged to rehab facility on 6/2.  The following day, patient's family noted that his right pupil was larger so was taken to the emergency department where neurology evaluated him. Exam was notable for right pupil 6 mm and left pupil 3 mm, round, reactive to light and accommodation--no APD.  CTA did not demonstrate any vascular abnormalities.   Pupil asymmetry lasted about 2 days.  Over the past week, family has noticed a huge improvement and say that he is almost back to his baseline but continues to have memory problems. 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 Neuropsychological testing with Dr. Valentina Shaggy showed neurocognitive disorder, unspecified, and ADHD.  He was cleared to drive by occupational therapy.   UPDATE 02/06/2016:  Patient is here with his wife for 6 month follow-up.  Since his last visit, his wife has noticed greater difficulty using his phone and remote control, such as remembering how to record things or use applications. He quickly forgets short-term events/conversations, such as what he had for dinner.  There has been no hallucinations or changes in behavior.  He tends to be irritable at night, which he wife attributes to his drinking.  Despite my recommendation to abstain from alcohol, he continues to consume 2-3 drinks nightly.  He has difficulty reading a novels, but is still able to keep up with newspaper. He reports to having one fall due to a rug being on the floor and wife feels that he drags his left leg sometimes.  He is walking unassisted.  He denies any incontinence.    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  He has not been compliant with his medications for ADHD.  He reports cutting back his alcohol intake, but wife disagrees with him.  He states that he  only drinks 2 glasses of wine nightly and has stopped hard liquor.  They have many questions regarding the prognosis and management options.  UPDATE 06/13/2016:  He is here for follow-up and feels that his memory is stable.  He is compliant with his medications and has noticed that he feels better because of this.  There have been no interval falls, hospitalizations, or illnesses.  He is walking daily and  started physical therapy.  His wife states that he continues to drink 3-4 oz of gin nightly.  He disagrees with his wife about his alcohol consumption and gets easily irritated when this is mentioned.   Wife states that he misplaces items all the time and there is a slow and gradual decline in memory, but he is still able to do the same level of work as before.   Medications:  Current Outpatient Prescriptions on File Prior to Visit  Medication Sig Dispense Refill  . amphetamine-dextroamphetamine (ADDERALL) 20 MG tablet Take 20 mg by mouth daily. Reported on 12/22/2014    . atorvastatin (LIPITOR) 20 MG tablet Place 1 tablet (20 mg total) into feeding tube daily at 6 PM. (Patient taking differently: Take 20 mg by mouth daily at 6 PM. ) 30 tablet 0  . clopidogrel (PLAVIX) 75 MG tablet Place 1 tablet (75 mg total) into feeding tube daily. (Patient taking differently: Take 75 mg by mouth daily. ) 30 tablet 0  . [employer] BISACODYL 5 MG EC tablet See admin instructions.  0  . donepezil (ARICEPT) 10 MG tablet Take half tablet daily x 1 month, then increase to 1 tablet daily. 90 tablet 3  . GAVILYTE-N WITH FLAVOR PACK 420 g solution See admin instructions.  0  . levothyroxine (SYNTHROID, LEVOTHROID) 75 MCG tablet Place 1 tablet (75 mcg total) into feeding tube daily before breakfast. (Patient taking differently: Take 75 mcg by mouth daily before breakfast. ) 30 tablet 0  . sodium fluoride (PREVIDENT 5000 PLUS) 1.1 % CREA dental cream Apply to tooth brush. Brush teeth for 2 minutes. Spit out excess-DO NOT swallow. Repeat nightly. (Patient not taking: Reported on 05/21/2016) 1 Tube prn  . tamsulosin (FLOMAX) 0.4 MG CAPS capsule Take 0.4 mg by mouth daily.  3  . thiamine 100 MG tablet Take 100 mg by mouth daily.      No current facility-administered medications on file prior to visit.     Allergies: No Known Allergies   Review of Systems:  CONSTITUTIONAL: No fevers, chills, night sweats, or weight loss.     EYES: No visual changes or eye pain ENT: No hearing changes.  No history of nose bleeds.   RESPIRATORY: No cough, wheezing and shortness of breath.   CARDIOVASCULAR: Negative for chest pain, and palpitations.   GI: Negative for abdominal discomfort, blood in stools or black stools.  No recent change in bowel habits.   GU:  No history of incontinence.   MUSCLOSKELETAL: No history of joint pain or swelling.  No myalgias.   SKIN: Negative for lesions, rash, and itching.   ENDOCRINE: Negative for cold or heat intolerance, polydipsia or goiter.   PSYCH:  No depression or anxiety symptoms.   NEURO: As Above.   Vital Signs:  BP 110/70   Pulse 80   Ht 6\' 1"  (1.854 m)   Wt 165 lb 8 oz (75.1 kg)   SpO2 97%   BMI 21.84 kg/m   Neurological Exam: MENTAL STATUS:  Alert, awake, and easily engages in conversation. Well groomed and  dressed. Oriented x5. Speech is not dysarthric, but there is intermittent stuttering of speech (old) and he has scattered thought process and easily distractible and irritable.  CRANIAL NERVES: Pupils round and reactive to light.  Normal conjugate, extra-ocular eye movements in all directions of gaze.  No ptosis. Face is symmetric. Palate elevates symmetrically.  Tongue is midline.  MOTOR:  Motor strength is 5/5 in all extremities, except 4+/5 left toe extensors and 5-/5 left dorsiflexion.  COORDINATION/GAIT:  Gait appears stable, unassisted.  Data: US carotids 12/30/2012: Findings suggest 1-39% internal carotid artery stenosis bilaterally. Vertebral arteries are patent with antegrade flow.   MRI/A brain 12/29/2012:  1. Ventricular prominence is disproportionate to the degree of atrophy, suggesting normal pressure hydrocephalus.  2. No other acute intracranial abnormality. Specifically, there is no evidence for acute or subacute infarct.  3. Mild distal small vessel disease is evident on the MRA without significant proximal stenosis, aneurysm, or branch vessel  occlusion.   48-hr holter:  NSR, rare PVCs  CT head 06/04/2014: Ventricular prominence similar to prior MR suggesting normal pressure hydrocephalus. No intracranial hemorrhage. No CT evidence of large acute infarct. No intracranial mass or abnormal enhancement.  CTA head and neck 06/04/2014:   Calcified plaque with mild to slightly moderate narrowing cavernous segment internal carotid artery bilaterally. Mild narrowing and irregularity M1 segment left middle cerebral artery. Mild narrowing distal left vertebral artery.  MRI lumbar spine wo contrast 09/30/2014: 1. Minimal left foraminal narrowing at L4-5 and L5-S1 secondary to leftward disc protrusions and facet hypertrophy. 2. Leftward disc protrusion at L3-4 without significant stenosis.  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  Lab Results  Component Value Date   TSH 3.858 05/18/2016   Lab Results  Component Value Date   VITAMINB12 758 05/27/2014   . IMPRESSION: 1.  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    - Continue donepezil 10mg  daily  - Continue staying active, he is walking daily  - Strongly encouraged him to reduce to alcohol intake as this is contributing to his cognition and neuropathy  - CT head showing ventricular prominence, follow clinically for signs of NPH, currently gait is stable and there is no incontinence  2. Alcoholic peripheral neuropathy causing sensory ataxia, again, alcohol cessation strongly encouraged.  Fall precautions  discussed   3. 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  Return to clinic in 9 months  The duration of this appointment visit was 15 minutes of face-to-face time with the patient.  Greater than 50% of this time was spent in counseling, explanation of diagnosis, planning of further management, and coordination of care.   Thank you for allowing me to participate in patient's care.  If I can answer any additional questions, I would be pleased to do so.    Sincerely,    Bradly Sangiovanni K. Posey Pronto, DO

## 2016-06-14 DIAGNOSIS — E7800 Pure hypercholesterolemia, unspecified: Secondary | ICD-10-CM | POA: Diagnosis not present

## 2016-06-14 DIAGNOSIS — E039 Hypothyroidism, unspecified: Secondary | ICD-10-CM | POA: Diagnosis not present

## 2016-06-14 DIAGNOSIS — R7301 Impaired fasting glucose: Secondary | ICD-10-CM | POA: Diagnosis not present

## 2016-06-14 DIAGNOSIS — G459 Transient cerebral ischemic attack, unspecified: Secondary | ICD-10-CM | POA: Diagnosis not present

## 2016-06-14 DIAGNOSIS — F908 Attention-deficit hyperactivity disorder, other type: Secondary | ICD-10-CM | POA: Diagnosis not present

## 2016-06-14 DIAGNOSIS — R413 Other amnesia: Secondary | ICD-10-CM | POA: Diagnosis not present

## 2016-06-18 ENCOUNTER — Ambulatory Visit: Payer: Medicare Other | Admitting: Physical Therapy

## 2016-06-22 ENCOUNTER — Encounter: Payer: Medicare Other | Admitting: Physical Therapy

## 2016-06-26 ENCOUNTER — Ambulatory Visit: Payer: Medicare Other

## 2016-06-26 DIAGNOSIS — R2681 Unsteadiness on feet: Secondary | ICD-10-CM

## 2016-06-26 DIAGNOSIS — R293 Abnormal posture: Secondary | ICD-10-CM

## 2016-06-26 DIAGNOSIS — R2689 Other abnormalities of gait and mobility: Secondary | ICD-10-CM

## 2016-06-26 NOTE — Therapy (Signed)
[place], [place], [address] Phone: [phone]   Fax:  [phone]  Physical Therapy Treatment  Patient Details  Name: Dylan Moreno MRN: [id number] Date of Birth: 10-04-1940 Referring Provider: Dr. Heath Lark  Encounter Date: 06/26/2016      PT End of Session - 06/26/16 1015    Visit Number 9   Number of Visits 9   Date for PT Re-Evaluation 06/29/16   PT Start Time 0935   PT Stop Time 1015   PT Time Calculation (min) 40 min   Activity Tolerance Patient tolerated treatment well   Behavior During Therapy [facility] for tasks assessed/performed      Past Medical History:  Diagnosis Date  . ADHD (attention deficit hyperactivity disorder)   . Anxiety   . Arthritis   . Cancer of base of tongue ([place]) 03/05/14   squamous cell carcinoma  . Dysuria 07/15/2014  . GERD (gastroesophageal reflux disease)   . History of radiation therapy 04/06/14- 05/25/14   Base of tongue and bilateral neck 70 Gy in 35 fractions to gross disease, 63 Gy in 35 Fractions to high risk nodal echelons, and 56 Gy in 35 fractions to intermediate risk nodal echelons  . Hypercholesterolemia   . Hyperlipemia   . Hypertension   . Kidney stones   . Peyronie's disease   . TIA (transient ischemic attack)   . TIA (transient ischemic attack) 12/29/12   left facial droop  . Wears glasses     Past Surgical History:  Procedure Laterality Date  . COLONOSCOPY    . CYSTOSCOPY W/ URETEROSCOPY W/ LITHOTRIPSY  2011  . 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   Procedure: DUPUYTREN CONTRACTURE RELEASE RIGHT PALM,RING AND SMALL FINGER;  Surgeon: Cammie Sickle., MD;  Location: [facility];  Service: Orthopedics;  Laterality: Right;  . HAND SURGERY     right  . SHOULDER ARTHROSCOPY W/ ROTATOR CUFF REPAIR     left  . TONSILLECTOMY      There were no vitals filed for this  visit.      Subjective Assessment - 06/26/16 0943    Subjective I've been working out with my trainer 3x/week and I'm back to walking 3x/week for about 45 mins getting my HR up. I'm feeling alot better/stronger, I think I'm ready to stop coming.    Pertinent History Diagnosed 3/16 with cancer on base of tongue. He underwent chemotherapy and radiation and completed that on 05/25/14.  He reports swelling in his neck began after radiation around 06/02/14.  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   Patient Stated Goals I want to get back to normal.   Currently in Pain? No/denies            [facility] PT Assessment - 06/26/16 0001      Standardized Balance Assessment   Standardized Balance Assessment Timed Up and Go Test     Timed Up and Go Test   Normal TUG (seconds) 6     Functional Gait  Assessment   Gait assessed  --                     [facility] Adult PT Treatment/Exercise - 06/26/16 0001      Knee/Hip Exercises: Aerobic   Nustep Level 6  x 7 minutes     Shoulder Exercises: Standing   Other Standing Exercises Back against the wall and instructing pt throughout in importance of correct posture while doing these (scapular series with red theraband) either in supine or standing against wall with correct/erect posture                        Long Term Clinic Goals - 06/01/16 1218      CC Long Term Goal  #1   Title Patient will be independent in home exercise program for gait and posture   Time 4   Period Weeks   Status On-going     CC Long Term Goal  #2   Title Patient will be knowledgeable about the importance of LE strengthening in his workouts with the trainer at his club   Time 4   Period Weeks   Status On-going     CC Long Term Goal  #3   Title Patient will improve functional gait assessment score to at least 27/30 for improved safety   Baseline  score of 24/30 on evaluation   Time 4   Period Weeks   Status On-going     CC Long Term Goal  #4   Title Pt. will be able to demonstrate erect posture with minimal forward head position for 30 second or longer assessment   Baseline measured today for a Jovi Pak   Time 4   Period Weeks   Status On-going            Plan - 06/26/16 1018    Clinical Impression Statement Pt would like to continue therapy 2x/wk as he feels this has been very beneficial to his recovery overall. Did not get to complete dynamic gait index as pt had alot of questions regarding posture and we spent time reviewing importance of scapular series either in supine or standing against wall, NOT with back unsupported. His TUG score has imporved from 10 to 6 seconds with no AD.    Rehab Potential Good   Clinical Impairments Affecting Rehab Potential some memory deficits reported on this patient   PT Frequency 2x / week   PT Duration 4 weeks   PT Treatment/Interventions ADLs/Self Care Home Management;Gait training;Stair training;Functional mobility training;Therapeutic exercise;Balance training;Neuromuscular re-education;Patient/family education;Manual lymph drainage;Manual techniques;Passive range of motion   PT Next Visit Plan He will be due for renewal next visit; perofrm gait index for goal assess and discuss with him if he wants to continue.  Cont high level balance activities in // bars such as tandem gait; cont tandem stance and single leg stance on [employer]; stance with eyes closed; walking with head movements, etc.  Cont working on posture including neck and upper back flexibility and strengthening/review scapular series against wall.  Include all instructions in HEP due to memory deficits.  Also include LE strengthening and endurance work.   Consulted and Agree with Plan of Care Patient      Patient will benefit from skilled therapeutic intervention in order to improve the following deficits and impairments:   Decreased balance, Abnormal gait, Increased edema, Decreased strength  Visit Diagnosis: Unsteadiness on feet  Abnormal posture  Other abnormalities of gait and mobility     Problem List Patient Active Problem List   Diagnosis Date Noted  . Solitary pulmonary nodule on lung CT 05/18/2016  . Acquired hypothyroidism 05/18/2016  . Dry mouth 12/22/2014  . Mild cognitive impairment with memory loss 12/22/2014  .  Balance disorder 09/22/2014  . 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 Dysuria 07/15/2014  . Dysphagia 06/17/2014  . Drug-induced memory loss ([place]) 06/17/2014  . Cerebral ventriculomegaly 06/08/2014  . Alcohol abuse 06/08/2014  . Dilated pupil 06/04/2014  . Aspiration into airway   . Acute delirium 06/02/2014  . Tongue swelling   . Hyponatremia   . Protein-calorie malnutrition, severe ([place]) 05/26/2014  . Subacute delirium 05/25/2014  . 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 Confusion caused by a drug ([place]) 05/20/2014  . Protein calorie malnutrition ([place]) 05/20/2014  . Insomnia 05/10/2014  . Leukopenia due to antineoplastic chemotherapy ([place]) 04/28/2014  . Mucositis due to chemotherapy 04/22/2014  . Dehydration 04/22/2014  . Weight loss 04/14/2014  . Chemotherapy-induced nausea 04/14/2014  . 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 Malignant neoplasm of base of tongue ([place]) 03/17/2014  . TIA (transient ischemic attack) 12/29/2012  . Hyperlipemia   . ADHD (attention deficit hyperactivity disorder)     Otelia Limes, PTA 06/26/2016, 10:26 AM  [place], [place], [address] Phone: [phone]   Fax:  [phone]  Name: SPARSH CALLENS MRN: [id number] Date of Birth: 12/08/1940

## 2016-06-28 ENCOUNTER — Ambulatory Visit: Payer: Medicare Other

## 2016-06-28 DIAGNOSIS — R2681 Unsteadiness on feet: Secondary | ICD-10-CM | POA: Diagnosis not present

## 2016-06-28 DIAGNOSIS — R293 Abnormal posture: Secondary | ICD-10-CM

## 2016-06-28 DIAGNOSIS — R2689 Other abnormalities of gait and mobility: Secondary | ICD-10-CM

## 2016-06-28 NOTE — Therapy (Signed)
[place], [place], [address] Phone: [phone]   Fax:  [phone]  Physical Therapy Treatment  Patient Details  Name: Dylan Moreno MRN: [id number] Date of Birth: 1940-08-03 Referring Provider: Dr. Heath Lark  Encounter Date: 06/28/2016      PT End of Session - 06/28/16 0935    Visit Number 10   Number of Visits 17   Date for PT Re-Evaluation 07/26/16   PT Start Time 0853   PT Stop Time 0933   PT Time Calculation (min) 40 min   Activity Tolerance Patient tolerated treatment well   Behavior During Therapy [facility] for tasks assessed/performed      Past Medical History:  Diagnosis Date  . ADHD (attention deficit hyperactivity disorder)   . Anxiety   . Arthritis   . 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 Dysuria 07/15/2014  . GERD (gastroesophageal reflux disease)   . History of radiation therapy 04/06/14- 05/25/14   Base of tongue and bilateral neck 70 Gy in 35 fractions to gross disease, 63 Gy in 35 Fractions to high risk nodal echelons, and 56 Gy in 35 fractions to intermediate risk nodal echelons  . Hypercholesterolemia   . Hyperlipemia   . Hypertension   . Kidney stones   . Peyronie's disease   . TIA (transient ischemic attack)   . TIA (transient ischemic attack) 12/29/12   left facial droop  . Wears glasses     Past Surgical History:  Procedure Laterality Date  . COLONOSCOPY    . CYSTOSCOPY W/ URETEROSCOPY W/ LITHOTRIPSY  2011  . 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   Procedure: DUPUYTREN CONTRACTURE RELEASE RIGHT PALM,RING AND SMALL FINGER;  Surgeon: Cammie Sickle., MD;  Location: [facility];  Service: Orthopedics;  Laterality: Right;  . HAND SURGERY     right  . SHOULDER ARTHROSCOPY W/ ROTATOR CUFF REPAIR     left  . TONSILLECTOMY      There were no vitals filed for  this visit.      Subjective Assessment - 06/28/16 0859    Subjective Nothing new since I was here last.    Pertinent History Diagnosed 3/16 with cancer on base of tongue. He underwent chemotherapy and radiation and completed that on 05/25/14.  He reports swelling in his neck began after radiation around 06/02/14.  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   Patient Stated Goals I want to get back to normal.   Currently in Pain? No/denies            [facility] PT Assessment - 06/28/16 0001      Functional Gait  Assessment   Gait Level Surface Walks 20 ft in less than 5.5 sec, no assistive devices, good speed, no evidence for imbalance, normal gait pattern, deviates no more than 6 in outside of the 12 in walkway width.  5 seconds   Change in Gait Speed Able to smoothly change walking speed without loss of balance or gait deviation. Deviate no more than 6 in outside of the 12 in walkway width.   Gait with Horizontal Head Turns Performs head turns smoothly with slight change in gait velocity (eg, minor disruption to smooth gait path), deviates 6-10 in outside 12 in walkway  width, or uses an assistive device.   Gait with Vertical Head Turns Performs head turns with no change in gait. Deviates no more than 6 in outside 12 in walkway width.   Gait and Pivot Turn Pivot turns safely within 3 sec and stops quickly with no loss of balance.   Step Over Obstacle Is able to step over 2 stacked shoe boxes taped together (9 in total height) without changing gait speed. No evidence of imbalance.   Gait with Narrow Base of Support Ambulates 7-9 steps.  Able to do 10 steps after multiple trials, avg 7-9   Gait with Eyes Closed Walks 20 ft, no assistive devices, good speed, no evidence of imbalance, normal gait pattern, deviates no more than 6 in outside 12 in walkway width. Ambulates 20 ft in less than 7 sec.  6  seconds   Ambulating Backwards Walks 20 ft, no assistive devices, good speed, no evidence for imbalance, normal gait   Steps Alternating feet, no rail.   Total Score 28                     [facility] Adult PT Treatment/Exercise - 06/28/16 0001      Neuro Re-ed    Neuro Re-ed Details  In // bars: Heel-toe 4 times, then retro heel-toe 6 times attempting no HHA throughout; grapevine 2 times each way, high knee marching 4 times, retro high knee march 2 times; squats 10 times, calf raises 20 times; SLS on green oval for contralateral LE 3 way raises with 2 lbs on ankle doing bil LE's; stepping over cones multiple trials until able to perform with no HHA.     Knee/Hip Exercises: Aerobic   Nustep Level 6 x 7 minutes, 72 steps/minute                        [facility] Goals - 06/28/16 0916      CC Long Term Goal  #1   Title Patient will be independent in home exercise program for gait and posture   Baseline Pt reports doing his HEP but requires remineders to be consistent with this-06/28/16   Status Partially Met     CC Long Term Goal  #2   Title Patient will be knowledgeable about the importance of LE strengthening in his workouts with the trainer at his club   Status Achieved     CC Long Term Goal  #3   Title Patient will improve functional gait assessment score to at least 27/30 for improved safety   Baseline score of 24/30 on evaluation; scored 28/30 on 06/28/16   Status Achieved     CC Long Term Goal  #4   Title Pt. will be able to demonstrate erect posture with minimal forward head position for 30 second or longer assessment   Baseline Pt able to hold head up with cuing but still struggles with doing so without cuing-06/28/16    Status Partially Met     CC Long Term Goal  #5   Title Patient will report he is able to stand and/or walk for >/= 1 hour to tolerate traveling some.   Time 4   Period Weeks   Status New            Plan - 06/28/16 0941     Clinical Impression Statement Pt did show good progress today with his gait index score improving from 24 to 28 meeting that goal. His  erect posture is imporivng though he still requires constant VC to demonstrate this with exercises/activities. He feels he has noticed improvement with his endurance/stamina but would like to continue to work on this as he still limited with his ADLs at this point.    Rehab Potential Good   Clinical Impairments Affecting Rehab Potential some memory deficits reported on this patient   PT Frequency 2x / week   PT Duration 4 weeks   PT Treatment/Interventions ADLs/Self Care Home Management;Gait training;Stair training;Functional mobility training;Therapeutic exercise;Balance training;Neuromuscular re-education;Patient/family education;Manual lymph drainage;Manual techniques;Passive range of motion   PT Next Visit Plan Renewal and Gcode done this visit. Cont high level balance activities in // bars such as tandem gait; cont tandem stance and single leg stance on Airex; stance with eyes closed; walking with head movements, etc.  Cont working on posture including neck and upper back flexibility and strengthening/review scapular series against wall.  Include all instructions in HEP due to memory deficits.  Also include LE strengthening and endurance work.   Consulted and Agree with Plan of Care Patient      Patient will benefit from skilled therapeutic intervention in order to improve the following deficits and impairments:  Decreased balance, Abnormal gait, Increased edema, Decreased strength  Visit Diagnosis: Unsteadiness on feet - Plan: PT plan of care cert/re-cert  Abnormal posture - Plan: PT plan of care cert/re-cert  Other abnormalities of gait and mobility - Plan: PT plan of care cert/re-cert       G-Codes - 07-10-2016 1205    Functional Assessment Tool Used (Outpatient Only) clinical judgement   Functional Limitation Mobility: Walking and moving around    Mobility: Walking and Moving Around Current Status (I7124) At least 1 percent but less than 20 percent impaired, limited or restricted   Mobility: Walking and Moving Around Goal Status [phone]) At least 1 percent but less than 20 percent impaired, limited or restricted    Annia Friendly, PT 2016/07/10 12:10 PM   Problem List Patient Active Problem List   Diagnosis Date Noted  . Solitary pulmonary nodule on lung CT 05/18/2016  . Acquired hypothyroidism 05/18/2016  . Dry mouth 12/22/2014  . Mild cognitive impairment with memory loss 12/22/2014  . Balance disorder 09/22/2014  . 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 Dysuria 07/15/2014  . Dysphagia 06/17/2014  . 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 Cerebral ventriculomegaly 06/08/2014  . Alcohol abuse 06/08/2014  . Dilated pupil 06/04/2014  . Aspiration into airway   . Acute delirium 06/02/2014  . Tongue swelling   . Hyponatremia   . Protein-calorie malnutrition, severe ([place]) 05/26/2014  . Subacute delirium 05/25/2014  . 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 Confusion caused by a drug ([place]) 05/20/2014  . Protein calorie malnutrition ([place]) 05/20/2014  . Insomnia 05/10/2014  . Leukopenia due to antineoplastic chemotherapy ([place]) 04/28/2014  . Mucositis due to chemotherapy 04/22/2014  . Dehydration 04/22/2014  . Weight loss 04/14/2014  . Chemotherapy-induced nausea 04/14/2014  . 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 Malignant neoplasm of base of tongue ([place]) 03/17/2014  . TIA (transient ischemic attack) 12/29/2012  . Hyperlipemia   . ADHD (attention deficit hyperactivity disorder)     Annia Friendly, PT 07/10/2016, 12:10 PM  [place], [place], [address] Phone: [phone]   Fax:  [phone]  Name: ALDWIN MICALIZZI MRN: [id number] Date of Birth: [birth year]

## 2016-07-03 ENCOUNTER — Ambulatory Visit: Payer: Medicare Other | Attending: Hematology and Oncology

## 2016-07-03 DIAGNOSIS — R2681 Unsteadiness on feet: Secondary | ICD-10-CM | POA: Diagnosis not present

## 2016-07-03 DIAGNOSIS — R2689 Other abnormalities of gait and mobility: Secondary | ICD-10-CM | POA: Diagnosis not present

## 2016-07-03 DIAGNOSIS — R293 Abnormal posture: Secondary | ICD-10-CM | POA: Diagnosis not present

## 2016-07-03 NOTE — Patient Instructions (Signed)
Can also do in standing with back, shoulders, head(as much as able) against the wall.  Over Head Pull: Narrow and Wide Grip   Cancer Rehab [phone]   On back, knees bent, feet flat, band across thighs, elbows straight but relaxed. Pull hands apart (start). Keeping elbows straight, bring arms up and over head, hands toward floor. Keep pull steady on band. Hold momentarily. Return slowly, keeping pull steady, back to start. Then do same with a wider grip on the band (past shoulder width) Repeat _10-20__ times. Band color __red____   Side Pull: Double Arm   On back, knees bent, feet flat. Arms perpendicular to body, shoulder level, elbows straight but relaxed. Pull arms out to sides, elbows straight. Resistance band comes across collarbones, hands toward floor. Hold momentarily. Slowly return to starting position. Repeat _10-20__ times. Band color _red____   Sword   On back, knees bent, feet flat, left hand on left hip, right hand above left. Pull right arm DIAGONALLY (hip to shoulder) across chest. Bring right arm along head toward floor. Hold momentarily. Slowly return to starting position. Repeat _10-20__ times. Do with left arm. Band color _red_____   Shoulder Rotation: Double Arm   On back, knees bent, feet flat, elbows tucked at sides, bent 90, hands palms up. Pull hands apart and down toward floor, keeping elbows near sides. Hold momentarily. Slowly return to starting position. Repeat _10-20__ times. Band color __red____

## 2016-07-03 NOTE — Therapy (Signed)
[place], [place], [address] Phone: [phone]   Fax:  [phone]  Physical Therapy Treatment  Patient Details  Name: Dylan Moreno MRN: [id number] Date of Birth: 07/01/1940 Referring Provider: Dr. Heath Lark  Encounter Date: 07/03/2016      PT End of Session - 07/03/16 0928    Visit Number 11   Number of Visits 17   Date for PT Re-Evaluation 07/26/16   PT Start Time 0851   PT Stop Time 0932   PT Time Calculation (min) 41 min   Activity Tolerance Patient tolerated treatment well   Behavior During Therapy [facility] for tasks assessed/performed      Past Medical History:  Diagnosis Date  . ADHD (attention deficit hyperactivity disorder)   . Anxiety   . Arthritis   . 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 Dysuria 07/15/2014  . GERD (gastroesophageal reflux disease)   . History of radiation therapy 04/06/14- 05/25/14   Base of tongue and bilateral neck 70 Gy in 35 fractions to gross disease, 63 Gy in 35 Fractions to high risk nodal echelons, and 56 Gy in 35 fractions to intermediate risk nodal echelons  . Hypercholesterolemia   . Hyperlipemia   . Hypertension   . Kidney stones   . Peyronie's disease   . TIA (transient ischemic attack)   . TIA (transient ischemic attack) 12/29/12   left facial droop  . Wears glasses     Past Surgical History:  Procedure Laterality Date  . COLONOSCOPY    . CYSTOSCOPY W/ URETEROSCOPY W/ LITHOTRIPSY  2011  . 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   Procedure: DUPUYTREN CONTRACTURE RELEASE RIGHT PALM,RING AND SMALL FINGER;  Surgeon: Cammie Sickle., MD;  Location: [facility];  Service: Orthopedics;  Laterality: Right;  . HAND SURGERY     right  . SHOULDER ARTHROSCOPY W/ ROTATOR CUFF REPAIR     left  . TONSILLECTOMY      There were no vitals filed for this  visit.      Subjective Assessment - 07/03/16 0858    Subjective I feel like my head contro/posture has some improved but I want it to be better.  Maybe I can get another printout of those exercises (scapular series) so I can have one at home and the office.   Pertinent History Diagnosed 3/16 with cancer on base of tongue. He underwent chemotherapy and radiation and completed that on 05/25/14.  He reports swelling in his neck began after radiation around 06/02/14.  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   Patient Stated Goals I want to get back to normal.   Currently in Pain? No/denies                         [facility] Adult PT Treatment/Exercise - 07/03/16 0001      Neuro Re-ed    Neuro Re-ed Details  In // bars: Tandem walking 4 times, retro walking 2 times, grapevine 2 times each direction, toe walking 2 times, slow, high knee marching 2 times then seated rest. Required mod VC to keep erect posture and for slow controlled movements throughout. Then walking in peds gym with CGA on pts belt: Change pace walking,  then same with Rt/Lt head turns, up/down looking, then incorporated all 4 with VC for quick pace; also walked along incline of mat 4 times with VC required to pick feet up. Then back to // bars on Airex: Bil SLS practice x1 min each; then bil tandem stance practice x1 min each.      Knee/Hip Exercises: Aerobic   Nustep Level 6 x 7 minutes, 72 steps/minute   Other Aerobic UBE after balance activites at Level 2 bkwds 3 mins/ fwd 2 mins with VC throughout to hold head erect.     Shoulder Exercises: Standing   Horizontal ABduction Strengthening;Both;10 reps;Theraband  Against wall   Theraband Level (Shoulder Horizontal ABduction) Level 2 (Red)   External Rotation Strengthening;Both;10 reps;Theraband  Against wall   Theraband Level (Shoulder External Rotation) Level 2 (Red)    Flexion Strengthening;Both;10 reps;Theraband  Narrow and Wide Grip against wall   Theraband Level (Shoulder Flexion) Level 2 (Red)   Other Standing Exercises Back against wall for bil D2 with red theraband 10 times each                PT Education - 07/03/16 0905    Education provided Yes   Education Details Reviewed scapular series with red theraband standing against wall   Person(s) Educated Patient   Methods Explanation;Demonstration;Handout;Verbal cues   Comprehension Verbalized understanding;Returned demonstration                [facility] Goals - 06/28/16 [phone]      CC Long Term Goal  #1   Title Patient will be independent in home exercise program for gait and posture   Baseline Pt reports doing his HEP but requires remineders to be consistent with this-06/28/16   Status Partially Met     CC Long Term Goal  #2   Title Patient will be knowledgeable about the importance of LE strengthening in his workouts with the trainer at his club   Status Achieved     CC Long Term Goal  #3   Title Patient will improve functional gait assessment score to at least 27/30 for improved safety   Baseline score of 24/30 on evaluation; scored 28/30 on 06/28/16   Status Achieved     CC Long Term Goal  #4   Title Pt. will be able to demonstrate erect posture with minimal forward head position for 30 second or longer assessment   Baseline Pt able to hold head up with cuing but still struggles with doing so without cuing-06/28/16    Status Partially Met     CC Long Term Goal  #5   Title Patient will report he is able to stand and/or walk for >/= 1 hour to tolerate traveling some.   Time 4   Period Weeks   Status New            Plan - 07/03/16 0929    Clinical Impression Statement Pt tolerated progression of exercises well today and reported feeling challenged by theses, though he did require more VC to keep head erect today as he was challenged more.    Rehab  Potential Good   Clinical Impairments Affecting Rehab Potential some memory deficits reported on this patient   PT Frequency 2x / week   PT Duration 4 weeks   PT Treatment/Interventions ADLs/Self Care Home Management;Gait training;Stair training;Functional mobility training;Therapeutic exercise;Balance training;Neuromuscular re-education;Patient/family education;Manual lymph drainage;Manual techniques;Passive range of motion   PT Next Visit Plan Cont high level balance activities incorporating  endurance as well to help pt progress toweards goal of increased endurance.       Patient will benefit from skilled therapeutic intervention in order to improve the following deficits and impairments:  Decreased balance, Abnormal gait, Increased edema, Decreased strength  Visit Diagnosis: Unsteadiness on feet  Abnormal posture  Other abnormalities of gait and mobility     Problem List Patient Active Problem List   Diagnosis Date Noted  . Solitary pulmonary nodule on lung CT 05/18/2016  . Acquired hypothyroidism 05/18/2016  . Dry mouth 12/22/2014  . Mild cognitive impairment with memory loss 12/22/2014  . Balance disorder 09/22/2014  . 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 Dysuria 07/15/2014  . Dysphagia 06/17/2014  . Drug-induced memory loss ([place]) 06/17/2014  . Cerebral ventriculomegaly 06/08/2014  . Alcohol abuse 06/08/2014  . Dilated pupil 06/04/2014  . Aspiration into airway   . Acute delirium 06/02/2014  . Tongue swelling   . Hyponatremia   . Protein-calorie malnutrition, severe ([place]) 05/26/2014  . Subacute delirium 05/25/2014  . 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 Confusion caused by a drug ([place]) 05/20/2014  . Protein calorie malnutrition ([place]) 05/20/2014  . Insomnia 05/10/2014  . Leukopenia due to antineoplastic chemotherapy ([place]) 04/28/2014  . Mucositis due to chemotherapy 04/22/2014  . Dehydration 04/22/2014  . Weight loss 04/14/2014  . Chemotherapy-induced nausea  04/14/2014  . 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 Malignant neoplasm of base of tongue ([place]) 03/17/2014  . TIA (transient ischemic attack) 12/29/2012  . Hyperlipemia   . ADHD (attention deficit hyperactivity disorder)     Otelia Limes, PTA 07/03/2016, 9:35 AM  [place], [place], [address] Phone: [phone]   Fax:  [phone]  Name: Dylan Moreno MRN: [id number] Date of Birth: 07-Jul-1940

## 2016-07-05 ENCOUNTER — Ambulatory Visit: Payer: Medicare Other

## 2016-07-05 DIAGNOSIS — R2689 Other abnormalities of gait and mobility: Secondary | ICD-10-CM | POA: Diagnosis not present

## 2016-07-05 DIAGNOSIS — R293 Abnormal posture: Secondary | ICD-10-CM

## 2016-07-05 DIAGNOSIS — R2681 Unsteadiness on feet: Secondary | ICD-10-CM | POA: Diagnosis not present

## 2016-07-05 NOTE — Therapy (Signed)
[place], [place], [address] Phone: [phone]   Fax:  [phone]  Physical Therapy Treatment  Patient Details  Name: Dylan Moreno MRN: [id number] Date of Birth: Sep 13, 1940 Referring Provider: Dr. Heath Lark  Encounter Date: 07/05/2016      PT End of Session - 07/05/16 1101    Visit Number 12   Number of Visits 17   Date for PT Re-Evaluation 07/26/16   PT Start Time 1024   PT Stop Time 1105   PT Time Calculation (min) 41 min   Activity Tolerance Patient tolerated treatment well   Behavior During Therapy [facility] for tasks assessed/performed      Past Medical History:  Diagnosis Date  . ADHD (attention deficit hyperactivity disorder)   . Anxiety   . Arthritis   . 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 Dysuria 07/15/2014  . GERD (gastroesophageal reflux disease)   . History of radiation therapy 04/06/14- 05/25/14   Base of tongue and bilateral neck 70 Gy in 35 fractions to gross disease, 63 Gy in 35 Fractions to high risk nodal echelons, and 56 Gy in 35 fractions to intermediate risk nodal echelons  . Hypercholesterolemia   . Hyperlipemia   . Hypertension   . Kidney stones   . Peyronie's disease   . TIA (transient ischemic attack)   . TIA (transient ischemic attack) 12/29/12   left facial droop  . Wears glasses     Past Surgical History:  Procedure Laterality Date  . COLONOSCOPY    . CYSTOSCOPY W/ URETEROSCOPY W/ LITHOTRIPSY  2011  . 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   Procedure: DUPUYTREN CONTRACTURE RELEASE RIGHT PALM,RING AND SMALL FINGER;  Surgeon: Cammie Sickle., MD;  Location: [place];  Service: Orthopedics;  Laterality: Right;  . HAND SURGERY     right  . SHOULDER ARTHROSCOPY W/ ROTATOR CUFF REPAIR     left  . TONSILLECTOMY      There were no vitals filed for this  visit.      Subjective Assessment - 07/05/16 1043    Subjective I'm feeling pretty good today. I notice I'm being more aware of my posture/head control lately.   Pertinent History Diagnosed 3/16 with cancer on base of tongue. He underwent chemotherapy and radiation and completed that on 05/25/14.  He reports swelling in his neck began after radiation around 06/02/14.  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   Patient Stated Goals I want to get back to normal.   Currently in Pain? No/denies                         [facility] Adult PT Treatment/Exercise - 07/05/16 0001      Neuro Re-ed    Neuro Re-ed Details  At // bars for +1 HHA for all today (were outside // bars): Standing on balance pad for 3 way LE raises with 2 lbs on ankle while working on SLS on opposite LE 2 sets of 10 with mod VC throughout for head control but pt is doing better with this today; standing in bil tandem stance, then narrow BOS with eyes closed (only had 2 LOB during) for 1 minute each letting go as able;  heel raises on Airex no HHA 20 times.; then along mat in peds gym for gait with multi directional head turns being called out by therapist 4 times, walking along incling (edge of black mat) with VC for correct heel-toe pattern; then stepping over 3 coned obstacle 6 times all with CGA by therapist on pts belt throughout gait activities.      Knee/Hip Exercises: Aerobic   Nustep Level 6 x 7 minutes   Other Aerobic UBE after balance activites at Level 2.5 bkwds 3 mins/ fwd 2 mins with VC throughout to hold head erect.                        [facility] Goals - 06/28/16 [phone]      CC Long Term Goal  #1   Title Patient will be independent in home exercise program for gait and posture   Baseline Pt reports doing his HEP but requires remineders to be consistent with this-06/28/16   Status  Partially Met     CC Long Term Goal  #2   Title Patient will be knowledgeable about the importance of LE strengthening in his workouts with the trainer at his club   Status Achieved     CC Long Term Goal  #3   Title Patient will improve functional gait assessment score to at least 27/30 for improved safety   Baseline score of 24/30 on evaluation; scored 28/30 on 06/28/16   Status Achieved     CC Long Term Goal  #4   Title Pt. will be able to demonstrate erect posture with minimal forward head position for 30 second or longer assessment   Baseline Pt able to hold head up with cuing but still struggles with doing so without cuing-06/28/16    Status Partially Met     CC Long Term Goal  #5   Title Patient will report he is able to stand and/or walk for >/= 1 hour to tolerate traveling some.   Time 4   Period Weeks   Status New            Plan - 07/05/16 1102    Clinical Impression Statement Pt continued with tolerating high level static and dynamic balance activities well. He required some less VC for correct posture/head control and he reports noticing he is finding himslef more aware of this as well during day.     Rehab Potential Good   Clinical Impairments Affecting Rehab Potential some memory deficits reported on this patient   PT Frequency 2x / week   PT Duration 4 weeks   PT Treatment/Interventions ADLs/Self Care Home Management;Gait training;Stair training;Functional mobility training;Therapeutic exercise;Balance training;Neuromuscular re-education;Patient/family education;Manual lymph drainage;Manual techniques;Passive range of motion   PT Next Visit Plan Cont high level balance activities incorporating endurance as well to help pt progress toweards goal of increased endurance.    Consulted and Agree with Plan of Care Patient      Patient will benefit from skilled therapeutic intervention in order to improve the following deficits and impairments:  Decreased balance,  Abnormal gait, Increased edema, Decreased strength  Visit Diagnosis: Unsteadiness on feet  Abnormal posture  Other abnormalities of gait and mobility     Problem List Patient Active Problem List   Diagnosis Date Noted  . Solitary pulmonary nodule on lung CT 05/18/2016  . Acquired hypothyroidism 05/18/2016  . Dry mouth 12/22/2014  . Mild cognitive impairment with memory loss 12/22/2014  . Balance disorder  09/22/2014  . 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 Dysuria 07/15/2014  . Dysphagia 06/17/2014  . 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 Cerebral ventriculomegaly 06/08/2014  . Alcohol abuse 06/08/2014  . Dilated pupil 06/04/2014  . Aspiration into airway   . Acute delirium 06/02/2014  . Tongue swelling   . Hyponatremia   . Protein-calorie malnutrition, severe ([place]) 05/26/2014  . Subacute delirium 05/25/2014  . 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 Confusion caused by a drug ([place]) 05/20/2014  . Protein calorie malnutrition ([place]) 05/20/2014  . Insomnia 05/10/2014  . Leukopenia due to antineoplastic chemotherapy ([place]) 04/28/2014  . Mucositis due to chemotherapy 04/22/2014  . Dehydration 04/22/2014  . Weight loss 04/14/2014  . Chemotherapy-induced nausea 04/14/2014  . 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 Malignant neoplasm of base of tongue ([place]) 03/17/2014  . TIA (transient ischemic attack) 12/29/2012  . Hyperlipemia   . ADHD (attention deficit hyperactivity disorder)     Otelia Limes, PTA 07/05/2016, 11:08 AM  [place], [place], [address] Phone: [phone]   Fax:  [phone]  Name: Dylan Moreno MRN: [id number] Date of Birth: 10/18/1940

## 2016-07-09 ENCOUNTER — Ambulatory Visit: Payer: Medicare Other

## 2016-07-09 DIAGNOSIS — R2681 Unsteadiness on feet: Secondary | ICD-10-CM | POA: Diagnosis not present

## 2016-07-09 DIAGNOSIS — R293 Abnormal posture: Secondary | ICD-10-CM | POA: Diagnosis not present

## 2016-07-09 DIAGNOSIS — R2689 Other abnormalities of gait and mobility: Secondary | ICD-10-CM | POA: Diagnosis not present

## 2016-07-09 NOTE — Patient Instructions (Signed)
Pectoralis Stretch: Supine (Towel Roll)    Lie with rolled towel under spine, letting shoulders relax toward floor with pillow under head.  Lie _2-3__ minutes. Do _1-2__ times per day. Then add sliding arms out to sides while keeping them on the floor until stretch felt in chest, hold for 20 seconds, repeat 5 times keeping arms on floor.   Cancer Rehab [phone]

## 2016-07-09 NOTE — Therapy (Signed)
[place], [place], [address] Phone: [phone]   Fax:  [phone]  Physical Therapy Treatment  Patient Details  Name: Dylan Moreno MRN: [id number] Date of Birth: [birth year] Referring Provider: Dr. Heath Lark  Encounter Date: 07/09/2016      PT End of Session - 07/09/16 1020    Visit Number 13   Number of Visits 17   Date for PT Re-Evaluation 07/26/16   PT Start Time 0935   PT Stop Time 1020   PT Time Calculation (min) 45 min   Activity Tolerance Patient tolerated treatment well   Behavior During Therapy [facility] for tasks assessed/performed      Past Medical History:  Diagnosis Date  . ADHD (attention deficit hyperactivity disorder)   . Anxiety   . Arthritis   . 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 Dysuria 07/15/2014  . GERD (gastroesophageal reflux disease)   . History of radiation therapy 04/06/14- 05/25/14   Base of tongue and bilateral neck 70 Gy in 35 fractions to gross disease, 63 Gy in 35 Fractions to high risk nodal echelons, and 56 Gy in 35 fractions to intermediate risk nodal echelons  . Hypercholesterolemia   . Hyperlipemia   . Hypertension   . Kidney stones   . Peyronie's disease   . TIA (transient ischemic attack)   . TIA (transient ischemic attack) 12/29/12   left facial droop  . Wears glasses     Past Surgical History:  Procedure Laterality Date  . COLONOSCOPY    . CYSTOSCOPY W/ URETEROSCOPY W/ LITHOTRIPSY  2011  . 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   Procedure: DUPUYTREN CONTRACTURE RELEASE RIGHT PALM,RING AND SMALL FINGER;  Surgeon: Cammie Sickle., MD;  Location: [place];  Service: Orthopedics;  Laterality: Right;  . HAND SURGERY     right  . SHOULDER ARTHROSCOPY W/ ROTATOR CUFF REPAIR     left  . TONSILLECTOMY      There were no vitals filed for this  visit.      Subjective Assessment - 07/09/16 0949    Subjective I was able to play gold this weekend for the first time in awhile. I stopped after 10 holes just because I was meeting friends for lunch, my endurance was great, I was surprised at how good. And I'd say my balance was at about 80% with walking on the uneven ground. Overall I was pleased with how well I did and felt.    Pertinent History Diagnosed 3/16 with cancer on base of tongue. He underwent chemotherapy and radiation and completed that on 05/25/14.  He reports swelling in his neck began after radiation around 06/02/14.  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   Patient Stated Goals I want to get back to normal.   Currently in Pain? No/denies                         [facility] Adult PT Treatment/Exercise - 07/09/16 0001      Neuro Re-ed    Neuro Re-ed Details  In // bars: Heel-toe walking 4 times, then retro heel-toe walking 4 times, grapevine 2 times to each side all with 0-1 HHA, pt showing great progress with this;  Standing on Airex with 3 lbs on each ankle for bil 3 way hip raises with VC throughout for erect posture, able to do most with +1 HHA 2 x 10 times each.      Knee/Hip Exercises: Aerobic   Nustep Level 7 x 9 minutes     Shoulder Exercises: Supine   Horizontal ABduction AROM;Both;10 reps  On foam roll   Horizontal ABduction Limitations Then with alternating UE's holding 1 lb 10 times each side always keeping one arm at chest level.    Flexion Strengthening;Right;Left;15 reps;Weights  On foam roll, alternating UE's   Shoulder Flexion Weight (lbs) 1   Flexion Limitations VC for slow, controlled pacing throughout   Other Supine Exercises Supine on foam roll for bil scaption (in a "V") 15 times with VC for slow, controlled pace throughout                PT Education - 07/09/16 1019     Education provided Yes   Education Details Supine over towel roll   Person(s) Educated Patient   Methods Explanation;Demonstration;Handout   Comprehension Verbalized understanding;Returned demonstration;Need further instruction                [facility] Goals - 06/28/16 9150      CC Long Term Goal  #1   Title Patient will be independent in home exercise program for gait and posture   Baseline Pt reports doing his HEP but requires remineders to be consistent with this-06/28/16   Status Partially Met     CC Long Term Goal  #2   Title Patient will be knowledgeable about the importance of LE strengthening in his workouts with the trainer at his club   Status Achieved     CC Long Term Goal  #3   Title Patient will improve functional gait assessment score to at least 27/30 for improved safety   Baseline score of 24/30 on evaluation; scored 28/30 on 06/28/16   Status Achieved     CC Long Term Goal  #4   Title Pt. will be able to demonstrate erect posture with minimal forward head position for 30 second or longer assessment   Baseline Pt able to hold head up with cuing but still struggles with doing so without cuing-06/28/16    Status Partially Met     CC Long Term Goal  #5   Title Patient will report he is able to stand and/or walk for >/= 1 hour to tolerate traveling some.   Time 4   Period Weeks   Status New            Plan - 07/09/16 1021    Clinical Impression Statement Pt continues to do well with higher level balance activites and progressed with increased weights which he reported feeling the challenge and benefit of. Also added postural exercises today and added these to his HEP.     Rehab Potential Good   Clinical Impairments Affecting Rehab Potential some memory deficits reported on this patient   PT Frequency 2x / week   PT Duration 4 weeks   PT Treatment/Interventions ADLs/Self Care Home Management;Gait training;Stair training;Functional mobility  training;Therapeutic exercise;Balance training;Neuromuscular re-education;Patient/family education;Manual lymph drainage;Manual techniques;Passive range of motion   PT Next Visit Plan Cont high level balance activities incorporating endurance as well to help pt progress toweards goal of increased endurance. Assess how pt felt after postural exercises today.    Consulted and Agree with Plan of Care Patient  Patient will benefit from skilled therapeutic intervention in order to improve the following deficits and impairments:  Decreased balance, Abnormal gait, Increased edema, Decreased strength  Visit Diagnosis: Unsteadiness on feet  Abnormal posture  Other abnormalities of gait and mobility     Problem List Patient Active Problem List   Diagnosis Date Noted  . Solitary pulmonary nodule on lung CT 05/18/2016  . Acquired hypothyroidism 05/18/2016  . Dry mouth 12/22/2014  . Mild cognitive impairment with memory loss 12/22/2014  . Balance disorder 09/22/2014  . 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 Dysuria 07/15/2014  . Dysphagia 06/17/2014  . Drug-induced memory loss ([place]) 06/17/2014  . Cerebral ventriculomegaly 06/08/2014  . Alcohol abuse 06/08/2014  . Dilated pupil 06/04/2014  . Aspiration into airway   . Acute delirium 06/02/2014  . Tongue swelling   . Hyponatremia   . Protein-calorie malnutrition, severe ([place]) 05/26/2014  . Subacute delirium 05/25/2014  . 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 Confusion caused by a drug ([place]) 05/20/2014  . Protein calorie malnutrition ([place]) 05/20/2014  . Insomnia 05/10/2014  . Leukopenia due to antineoplastic chemotherapy ([place]) 04/28/2014  . Mucositis due to chemotherapy 04/22/2014  . Dehydration 04/22/2014  . Weight loss 04/14/2014  . Chemotherapy-induced nausea 04/14/2014  . 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 Malignant neoplasm of base of tongue ([place]) 03/17/2014  . TIA (transient ischemic attack) 12/29/2012  .  Hyperlipemia   . ADHD (attention deficit hyperactivity disorder)     Otelia Limes, PTA 07/09/2016, 10:23 AM  [place], [place], [address] Phone: [phone]   Fax:  [phone]  Name: GIANG HEMME MRN: [id number] Date of Birth: 1940-08-27

## 2016-07-11 ENCOUNTER — Ambulatory Visit: Payer: Medicare Other

## 2016-07-11 DIAGNOSIS — R2689 Other abnormalities of gait and mobility: Secondary | ICD-10-CM

## 2016-07-11 DIAGNOSIS — R293 Abnormal posture: Secondary | ICD-10-CM | POA: Diagnosis not present

## 2016-07-11 DIAGNOSIS — R2681 Unsteadiness on feet: Secondary | ICD-10-CM | POA: Diagnosis not present

## 2016-07-11 NOTE — Therapy (Signed)
[place], [place], [address] Phone: [phone]   Fax:  [phone]  Physical Therapy Treatment  Patient Details  Name: Dylan Moreno MRN: [id number] Date of Birth: September 20, 1940 Referring Provider: Dr. Heath Lark  Encounter Date: 07/11/2016      PT End of Session - 07/11/16 0925    Visit Number 14   Number of Visits 17   Date for PT Re-Evaluation 07/26/16   PT Start Time 0851   PT Stop Time 0929   PT Time Calculation (min) 38 min   Activity Tolerance Patient tolerated treatment well   Behavior During Therapy [facility] for tasks assessed/performed      Past Medical History:  Diagnosis Date  . ADHD (attention deficit hyperactivity disorder)   . Anxiety   . Arthritis   . 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 Dysuria 07/15/2014  . GERD (gastroesophageal reflux disease)   . History of radiation therapy 04/06/14- 05/25/14   Base of tongue and bilateral neck 70 Gy in 35 fractions to gross disease, 63 Gy in 35 Fractions to high risk nodal echelons, and 56 Gy in 35 fractions to intermediate risk nodal echelons  . Hypercholesterolemia   . Hyperlipemia   . Hypertension   . Kidney stones   . Peyronie's disease   . TIA (transient ischemic attack)   . TIA (transient ischemic attack) 12/29/12   left facial droop  . Wears glasses     Past Surgical History:  Procedure Laterality Date  . COLONOSCOPY    . CYSTOSCOPY W/ URETEROSCOPY W/ LITHOTRIPSY  2011  . 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   Procedure: DUPUYTREN CONTRACTURE RELEASE RIGHT PALM,RING AND SMALL FINGER;  Surgeon: Cammie Sickle., MD;  Location: [place];  Service: Orthopedics;  Laterality: Right;  . HAND SURGERY     right  . SHOULDER ARTHROSCOPY W/ ROTATOR CUFF REPAIR     left  . TONSILLECTOMY      There were no vitals filed for  this visit.      Subjective Assessment - 07/11/16 0906    Subjective Nothing new since I was here last.    Pertinent History Diagnosed 3/16 with cancer on base of tongue. He underwent chemotherapy and radiation and completed that on 05/25/14.  He reports swelling in his neck began after radiation around 06/02/14.  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   Patient Stated Goals I want to get back to normal.   Currently in Pain? No/denies                         [facility] Adult PT Treatment/Exercise - 07/11/16 0001      Neuro Re-ed    Neuro Re-ed Details  In // bars: Heel-toe walking 4 times, then retro heel-toe walking 4 times (pt conts to require VC for erect posture but is improving with this), grapevine 2 times to each side all with 0-1 HHA, pt showing great progress with this; Standing on Airex with 3 lbs on each ankle for bil 3 way hip raises with VC throughout for erect posture, able to do most with +1 HHA 2 x 10 times each     Knee/Hip Exercises: Aerobic   Nustep  Level 7 x 7 minutes     Knee/Hip Exercises: Standing   Walking with Sports Cord Resisted Walking for balance using FreeMotion machine all 4 ways: 20 lbs for bil sidestepping and backwards walking, 13 lbs with front all with mod A holding either sportscord or pts belt throughout.     Shoulder Exercises: Supine   Horizontal ABduction AROM;Both;10 reps  On foam roll   Horizontal ABduction Limitations Then with alternating UE's holding 1 lb 15 times each side always keeping one arm at chest level.    Flexion Strengthening;Right;Left;15 reps;Weights  On foam roll, alternating UE's   Shoulder Flexion Weight (lbs) 1   Flexion Limitations VC for slow, controlled pacing throughout   Other Supine Exercises Supine on foam roll for bil scaption (in a "V") 15 times with VC for slow, controlled pace throughout                         [facility] Goals - 06/28/16 0916      CC Long Term Goal  #1   Title Patient will be independent in home exercise program for gait and posture   Baseline Pt reports doing his HEP but requires remineders to be consistent with this-06/28/16   Status Partially Met     CC Long Term Goal  #2   Title Patient will be knowledgeable about the importance of LE strengthening in his workouts with the trainer at his club   Status Achieved     CC Long Term Goal  #3   Title Patient will improve functional gait assessment score to at least 27/30 for improved safety   Baseline score of 24/30 on evaluation; scored 28/30 on 06/28/16   Status Achieved     CC Long Term Goal  #4   Title Pt. will be able to demonstrate erect posture with minimal forward head position for 30 second or longer assessment   Baseline Pt able to hold head up with cuing but still struggles with doing so without cuing-06/28/16    Status Partially Met     CC Long Term Goal  #5   Title Patient will report he is able to stand and/or walk for >/= 1 hour to tolerate traveling some.   Time 4   Period Weeks   Status New            Plan - 07/11/16 0930    Clinical Impression Statement Pt was very challenged by resisted walking today but reported liking this activity and noting benefit of it. Continued with postural strength on foam roll and pt seemed to have even more difficulty with this today even with continued VC to keep abdominals engaged. Pt will benefit from continued focus on bil LE strength and balance activities as he is noting progress from this with his ADLs as reported earlier this week.    Rehab Potential Good   Clinical Impairments Affecting Rehab Potential some memory deficits reported on this patient   PT Frequency 2x / week   PT Duration 4 weeks   PT Treatment/Interventions ADLs/Self Care Home Management;Gait training;Stair training;Functional mobility training;Therapeutic  exercise;Balance training;Neuromuscular re-education;Patient/family education;Manual lymph drainage;Manual techniques;Passive range of motion   PT Next Visit Plan Cont high level balance activities, including resisted walking, incorporating endurance as well to help pt progress toweards goal of increased endurance. Cont postural stngth on foam roll as well.    Consulted and Agree with Plan of Care Patient  Patient will benefit from skilled therapeutic intervention in order to improve the following deficits and impairments:  Decreased balance, Abnormal gait, Increased edema, Decreased strength  Visit Diagnosis: Unsteadiness on feet  Abnormal posture  Other abnormalities of gait and mobility     Problem List Patient Active Problem List   Diagnosis Date Noted  . Solitary pulmonary nodule on lung CT 05/18/2016  . Acquired hypothyroidism 05/18/2016  . Dry mouth 12/22/2014  . Mild cognitive impairment with memory loss 12/22/2014  . Balance disorder 09/22/2014  . 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 Dysuria 07/15/2014  . Dysphagia 06/17/2014  . 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 Cerebral ventriculomegaly 06/08/2014  . Alcohol abuse 06/08/2014  . Dilated pupil 06/04/2014  . Aspiration into airway   . Acute delirium 06/02/2014  . Tongue swelling   . Hyponatremia   . Protein-calorie malnutrition, severe ([place]) 05/26/2014  . Subacute delirium 05/25/2014  . 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 Confusion caused by a drug ([place]) 05/20/2014  . Protein calorie malnutrition ([place]) 05/20/2014  . Insomnia 05/10/2014  . Leukopenia due to antineoplastic chemotherapy ([place]) 04/28/2014  . Mucositis due to chemotherapy 04/22/2014  . Dehydration 04/22/2014  . Weight loss 04/14/2014  . Chemotherapy-induced nausea 04/14/2014  . 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 Malignant neoplasm of base of tongue ([place]) 03/17/2014  . TIA (transient ischemic attack) 12/29/2012  .  Hyperlipemia   . ADHD (attention deficit hyperactivity disorder)     Otelia Limes, PTA 07/11/2016, 9:35 AM  [place], [place], [address] Phone: [phone]   Fax:  [phone]  Name: Dylan Moreno MRN: [id number] Date of Birth: 09-25-1940

## 2016-07-17 ENCOUNTER — Ambulatory Visit: Payer: Medicare Other | Admitting: Physical Therapy

## 2016-07-17 DIAGNOSIS — R2681 Unsteadiness on feet: Secondary | ICD-10-CM

## 2016-07-17 DIAGNOSIS — R2689 Other abnormalities of gait and mobility: Secondary | ICD-10-CM | POA: Diagnosis not present

## 2016-07-17 DIAGNOSIS — R293 Abnormal posture: Secondary | ICD-10-CM | POA: Diagnosis not present

## 2016-07-17 NOTE — Therapy (Signed)
[place], [place], [address] Phone: [phone]   Fax:  [phone]  Physical Therapy Treatment  Patient Details  Name: Dylan Moreno MRN: [id number] Date of Birth: [birth year] Referring Provider: Dr. Heath Lark  Encounter Date: 07/17/2016      PT End of Session - 07/17/16 1731    Visit Number 15   Number of Visits 17   Date for PT Re-Evaluation 07/26/16   PT Start Time 1351   PT Stop Time 1432   PT Time Calculation (min) 41 min   Activity Tolerance Patient tolerated treatment well   Behavior During Therapy [facility] for tasks assessed/performed      Past Medical History:  Diagnosis Date  . ADHD (attention deficit hyperactivity disorder)   . Anxiety   . Arthritis   . 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 Dysuria 07/15/2014  . GERD (gastroesophageal reflux disease)   . History of radiation therapy 04/06/14- 05/25/14   Base of tongue and bilateral neck 70 Gy in 35 fractions to gross disease, 63 Gy in 35 Fractions to high risk nodal echelons, and 56 Gy in 35 fractions to intermediate risk nodal echelons  . Hypercholesterolemia   . Hyperlipemia   . Hypertension   . Kidney stones   . Peyronie's disease   . TIA (transient ischemic attack)   . TIA (transient ischemic attack) 12/29/12   left facial droop  . Wears glasses     Past Surgical History:  Procedure Laterality Date  . COLONOSCOPY    . CYSTOSCOPY W/ URETEROSCOPY W/ LITHOTRIPSY  2011  . 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   Procedure: DUPUYTREN CONTRACTURE RELEASE RIGHT PALM,RING AND SMALL FINGER;  Surgeon: Cammie Sickle., MD;  Location: [facility];  Service: Orthopedics;  Laterality: Right;  . HAND SURGERY     right  . SHOULDER ARTHROSCOPY W/ ROTATOR CUFF REPAIR     left  . TONSILLECTOMY      There were no vitals filed for  this visit.      Subjective Assessment - 07/17/16 1352    Subjective "This is working good.  Plus still working out with the trainer.  I'm not as energetic as I was, but I will be."                         [facility] Adult PT Treatment/Exercise - 07/17/16 0001      Neuro Re-ed    Neuro Re-ed Details  IN // bars: heel-toe walk forward and back x 3 each way with one hand on bars; grapevine 2x each way with 0-1 hand on bars during this, occasionally caught one foot on the other.     Neck Exercises: Standing   Other Standing Exercises with back against recumbent bike seatback, neck extension vs. green Theraband x 20     Knee/Hip Exercises: Aerobic   Nustep Level 7 x 7 minutes     Knee/Hip Exercises: Standing   Walking with Sports Cord Resisted Walking for balance using FreeMotion machine all 4 ways: 20 lbs for bil sidestepping and backwards walking x 5 each way, 13 lbs x 2 then 20 lbs. x 3 with front all with mod A holding either sportscord or pts belt throughout.     Shoulder Exercises: Standing   Row Strengthening;Both;20 reps;Theraband  with back at  recumbent bike seat back for stability   Theraband Level (Shoulder Row) Level 3 (Green)   Other Standing Exercises standing with back against wall, 2 lb. weights in each hand, raise arms up x 10; then same with 4 lb. weight in each hand x 10 x 2; focus on keeping shoulders, arms, and head close to wall   Other Standing Exercises with back against wall, 3-way arm raises with 3 lb. weights x 10 each way;                         [facility] Goals - 06/28/16 [phone]      CC Long Term Goal  #1   Title Patient will be independent in home exercise program for gait and posture   Baseline Pt reports doing his HEP but requires remineders to be consistent with this-06/28/16   Status Partially Met     CC Long Term Goal  #2   Title Patient will be knowledgeable about the importance of LE strengthening in his workouts  with the trainer at his club   Status Achieved     CC Long Term Goal  #3   Title Patient will improve functional gait assessment score to at least 27/30 for improved safety   Baseline score of 24/30 on evaluation; scored 28/30 on 06/28/16   Status Achieved     CC Long Term Goal  #4   Title Pt. will be able to demonstrate erect posture with minimal forward head position for 30 second or longer assessment   Baseline Pt able to hold head up with cuing but still struggles with doing so without cuing-06/28/16    Status Partially Met     CC Long Term Goal  #5   Title Patient will report he is able to stand and/or walk for >/= 1 hour to tolerate traveling some.   Time 4   Period Weeks   Status New            Plan - 07/17/16 1731    Clinical Impression Statement Patient is doing well with therapy and is noting improvement as a result. He will benefit from continued work on improved posture, strength, balance, and endurance.   Clinical Impairments Affecting Rehab Potential some memory deficits reported on this patient   PT Frequency 2x / week   PT Duration 4 weeks   PT Treatment/Interventions ADLs/Self Care Home Management;Gait training;Stair training;Functional mobility training;Therapeutic exercise;Balance training;Neuromuscular re-education;Patient/family education;Manual lymph drainage;Manual techniques;Passive range of motion   PT Next Visit Plan Cont high level balance activities, including resisted walking, incorporating endurance as well to help pt progress toweards goal of increased endurance. Cont postural strength on foam roll as well.    PT Home Exercise Plan Postural strength, hip flexibility, and cont walking routine.    Consulted and Agree with Plan of Care Patient      Patient will benefit from skilled therapeutic intervention in order to improve the following deficits and impairments:     Visit Diagnosis: Unsteadiness on feet  Abnormal posture  Other abnormalities of  gait and mobility     Problem List Patient Active Problem List   Diagnosis Date Noted  . Solitary pulmonary nodule on lung CT 05/18/2016  . Acquired hypothyroidism 05/18/2016  . Dry mouth 12/22/2014  . Mild cognitive impairment with memory loss 12/22/2014  . Balance disorder 09/22/2014  . 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 Dysuria 07/15/2014  . Dysphagia 06/17/2014  . 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 Cerebral ventriculomegaly 06/08/2014  . Alcohol abuse 06/08/2014  . Dilated pupil 06/04/2014  . Aspiration into airway   . Acute delirium 06/02/2014  . Tongue swelling   . Hyponatremia   . Protein-calorie malnutrition, severe ([place]) 05/26/2014  . Subacute delirium 05/25/2014  . 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 Confusion caused by a drug ([place]) 05/20/2014  . Protein calorie malnutrition ([place]) 05/20/2014  . Insomnia 05/10/2014  . Leukopenia due to antineoplastic chemotherapy ([place]) 04/28/2014  . Mucositis due to chemotherapy 04/22/2014  . Dehydration 04/22/2014  . Weight loss 04/14/2014  . Chemotherapy-induced nausea 04/14/2014  . 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 Malignant neoplasm of base of tongue ([place]) 03/17/2014  . TIA (transient ischemic attack) 12/29/2012  . Hyperlipemia   . ADHD (attention deficit hyperactivity disorder)     Michaelyn Wall 07/17/2016, 5:33 PM  [place], [place], [address] Phone: [phone]   Fax:  [phone]  Name: Dylan Moreno MRN: [id number] Date of Birth: 05-29-1940  Serafina Royals, PT 07/17/16 5:33 PM

## 2016-07-18 ENCOUNTER — Ambulatory Visit: Payer: Medicare Other | Admitting: Physical Therapy

## 2016-07-18 DIAGNOSIS — R2681 Unsteadiness on feet: Secondary | ICD-10-CM

## 2016-07-18 DIAGNOSIS — R293 Abnormal posture: Secondary | ICD-10-CM | POA: Diagnosis not present

## 2016-07-18 DIAGNOSIS — R2689 Other abnormalities of gait and mobility: Secondary | ICD-10-CM

## 2016-07-18 NOTE — Therapy (Signed)
[place], [place], [address] Phone: [phone]   Fax:  [phone]  Physical Therapy Treatment  Patient Details  Name: Dylan Moreno MRN: [id number] Date of Birth: 1940/11/26 Referring Provider: Dr. Heath Lark  Encounter Date: 07/18/2016      PT End of Session - 07/18/16 1213    Visit Number [place]   Number of Visits 17   Date for PT Re-Evaluation 07/26/16   PT Start Time 0845   PT Stop Time 0932   PT Time Calculation (min) 47 min   Activity Tolerance Patient tolerated treatment well   Behavior During Therapy [facility] for tasks assessed/performed      Past Medical History:  Diagnosis Date  . ADHD (attention deficit hyperactivity disorder)   . Anxiety   . Arthritis   . 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 Dysuria 07/15/2014  . GERD (gastroesophageal reflux disease)   . History of radiation therapy 04/06/14- 05/25/14   Base of tongue and bilateral neck 70 Gy in 35 fractions to gross disease, 63 Gy in 35 Fractions to high risk nodal echelons, and 56 Gy in 35 fractions to intermediate risk nodal echelons  . Hypercholesterolemia   . Hyperlipemia   . Hypertension   . Kidney stones   . Peyronie's disease   . TIA (transient ischemic attack)   . TIA (transient ischemic attack) 12/29/12   left facial droop  . Wears glasses     Past Surgical History:  Procedure Laterality Date  . COLONOSCOPY    . CYSTOSCOPY W/ URETEROSCOPY W/ LITHOTRIPSY  2011  . 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   Procedure: DUPUYTREN CONTRACTURE RELEASE RIGHT PALM,RING AND SMALL FINGER;  Surgeon: Cammie Sickle., MD;  Location: [facility];  Service: Orthopedics;  Laterality: Right;  . HAND SURGERY     right  . SHOULDER ARTHROSCOPY W/ ROTATOR CUFF REPAIR     left  . TONSILLECTOMY      There were no vitals filed  for this visit.      Subjective Assessment - 07/18/16 0846    Subjective "I want to get more intellectually involved with my treatments."  "I just don't have the energy I used to have. The treatment knocked me more than I thought it would." I want to have the energy to travel.                         [facility] Adult PT Treatment/Exercise - 07/18/16 0001      Self-Care   Self-Care Other Self-Care Comments   Other Self-Care Comments  Discussed adding more walking to his routine in order to try to increase energy more; also to try short trips to see how he would do with travel, which is a goal for him.     Knee/Hip Exercises: Aerobic   Nustep Level 7 x 7 minutes     Knee/Hip Exercises: Standing   Heel Raises Both;20 reps;1 second;2 sets  holding 15 lb. kettlebell, standing in corner   Functional Squat 3 sets;10 seconds  holding 15 lb. kettlebell, back in corner   Walking with Sports Cord Resisted Walking for balance using FreeMotion machine all 4 ways: 20 lbs for bil sidestepping and 27 lbs. for backwards walking x 5 each way, 27 lbs. x 5 with front all with mod  A holding either sportscord or pts belt throughout.   Other Standing Knee Exercises sidestep with squat 20 feet each way     Shoulder Exercises: Standing   Other Standing Exercises upright rows, 15 lb. kettlebell x 10 x 3   Other Standing Exercises robber's pose with back toward wall, 5 lb. weight in each hand, raise arms up x 10 x 3                PT Education - 07/18/16 1213    Education provided Yes   Education Details about adding more walking to his routine   Person(s) Educated Patient   Methods Explanation   Comprehension Verbalized understanding                [facility] Goals - 07/18/16 1216      CC Long Term Goal  #1   Title Patient will be independent in home exercise program for gait and posture   Status Partially Met     CC Long Term Goal  #2   Title Patient will be  knowledgeable about the importance of LE strengthening in his workouts with the trainer at his club   Status Achieved     CC Long Term Goal  #3   Title Patient will improve functional gait assessment score to at least 27/30 for improved safety   Status Achieved     CC Long Term Goal  #4   Title Pt. will be able to demonstrate erect posture with minimal forward head position for 30 second or longer assessment   Status Partially Met     CC Long Term Goal  #5   Title Patient will report he is able to stand and/or walk for >/= 1 hour to tolerate traveling some.   Status On-going            Plan - 07/18/16 1214    Clinical Impression Statement Pt. was challenged by the workout today, and really worked up a sweat.  He increased resistance on resisted walking, and what he did was a big challenge for him. He is still impatient about progress not happening fast enough, so we talked about his adding walking to his routine, at least on days he doesn't come to therapy or work with his trainer, and about trying a short trip to see how he does with travel, a goal of his.   Rehab Potential Good   Clinical Impairments Affecting Rehab Potential some memory deficits reported on this patient   PT Frequency 2x / week   PT Duration 4 weeks   PT Treatment/Interventions ADLs/Self Care Home Management;Gait training;Stair training;Functional mobility training;Therapeutic exercise;Balance training;Neuromuscular re-education;Patient/family education;Manual lymph drainage;Manual techniques;Passive range of motion   PT Next Visit Plan Will need renewal soon. Cont high level balance activities, including resisted walking, incorporating endurance as well to help pt progress toweards goal of increased endurance. Cont postural strength on foam roll as well.    PT Home Exercise Plan Postural strength, hip flexibility, and cont walking routine.    Consulted and Agree with Plan of Care Patient      Patient will  benefit from skilled therapeutic intervention in order to improve the following deficits and impairments:  Decreased balance, Abnormal gait, Increased edema, Decreased strength  Visit Diagnosis: Unsteadiness on feet  Abnormal posture  Other abnormalities of gait and mobility     Problem List Patient Active Problem List   Diagnosis Date Noted  . Solitary pulmonary nodule on lung  CT 05/18/2016  . Acquired hypothyroidism 05/18/2016  . Dry mouth 12/22/2014  . Mild cognitive impairment with memory loss 12/22/2014  . Balance disorder 09/22/2014  . 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 Dysuria 07/15/2014  . Dysphagia 06/17/2014  . Drug-induced memory loss ([place]) 06/17/2014  . Cerebral ventriculomegaly 06/08/2014  . Alcohol abuse 06/08/2014  . Dilated pupil 06/04/2014  . Aspiration into airway   . Acute delirium 06/02/2014  . Tongue swelling   . Hyponatremia   . Protein-calorie malnutrition, severe ([place]) 05/26/2014  . Subacute delirium 05/25/2014  . 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 Confusion caused by a drug ([place]) 05/20/2014  . Protein calorie malnutrition ([place]) 05/20/2014  . Insomnia 05/10/2014  . Leukopenia due to antineoplastic chemotherapy ([place]) 04/28/2014  . Mucositis due to chemotherapy 04/22/2014  . Dehydration 04/22/2014  . Weight loss 04/14/2014  . Chemotherapy-induced nausea 04/14/2014  . 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 Malignant neoplasm of base of tongue ([place]) 03/17/2014  . TIA (transient ischemic attack) 12/29/2012  . Hyperlipemia   . ADHD (attention deficit hyperactivity disorder)     Indiya Izquierdo 07/18/2016, 12:18 PM  [place], [place], [address] Phone: [phone]   Fax:  [phone]  Name: Dylan Moreno MRN: [id number] Date of Birth: 31-May-1940  Serafina Royals, PT 07/18/16 12:18 PM

## 2016-07-24 ENCOUNTER — Ambulatory Visit: Payer: Medicare Other

## 2016-07-24 DIAGNOSIS — R2681 Unsteadiness on feet: Secondary | ICD-10-CM | POA: Diagnosis not present

## 2016-07-24 DIAGNOSIS — R293 Abnormal posture: Secondary | ICD-10-CM

## 2016-07-24 DIAGNOSIS — R2689 Other abnormalities of gait and mobility: Secondary | ICD-10-CM | POA: Diagnosis not present

## 2016-07-24 NOTE — Therapy (Signed)
[place], [place], [address] Phone: [phone]   Fax:  [phone]  Physical Therapy Treatment  Patient Details  Name: Dylan Moreno MRN: [id number] Date of Birth: 1940-07-25 Referring Provider: Dr. Heath Lark  Encounter Date: 07/24/2016      PT End of Session - 07/24/16 1019    Visit Number 17  kx   Number of Visits 17   Date for PT Re-Evaluation 07/26/16   PT Start Time 0936  Pt running a few mins late today   PT Stop Time 1019   PT Time Calculation (min) 43 min   Activity Tolerance Patient tolerated treatment well   Behavior During Therapy [facility] for tasks assessed/performed      Past Medical History:  Diagnosis Date  . ADHD (attention deficit hyperactivity disorder)   . Anxiety   . Arthritis   . 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 Dysuria 07/15/2014  . GERD (gastroesophageal reflux disease)   . History of radiation therapy 04/06/14- 05/25/14   Base of tongue and bilateral neck 70 Gy in 35 fractions to gross disease, 63 Gy in 35 Fractions to high risk nodal echelons, and 56 Gy in 35 fractions to intermediate risk nodal echelons  . Hypercholesterolemia   . Hyperlipemia   . Hypertension   . Kidney stones   . Peyronie's disease   . TIA (transient ischemic attack)   . TIA (transient ischemic attack) 12/29/12   left facial droop  . Wears glasses     Past Surgical History:  Procedure Laterality Date  . COLONOSCOPY    . CYSTOSCOPY W/ URETEROSCOPY W/ LITHOTRIPSY  2011  . 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   Procedure: DUPUYTREN CONTRACTURE RELEASE RIGHT PALM,RING AND SMALL FINGER;  Surgeon: Cammie Sickle., MD;  Location: [facility];  Service: Orthopedics;  Laterality: Right;  . HAND SURGERY     right  . SHOULDER ARTHROSCOPY W/ ROTATOR CUFF REPAIR     left  . TONSILLECTOMY       There were no vitals filed for this visit.      Subjective Assessment - 07/24/16 0942    Subjective I felt good after last visit, she worked me good, but it was a good workout.   Pertinent History Diagnosed 3/16 with cancer on base of tongue. He underwent chemotherapy and radiation and completed that on 05/25/14.  He reports swelling in his neck began after radiation around 06/02/14.  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   Patient Stated Goals I want to get back to normal.   Currently in Pain? No/denies                         [facility] Adult PT Treatment/Exercise - 07/24/16 0001      Knee/Hip Exercises: Aerobic   Nustep Level 7 x 5 minutes  Pt ran late to appt so shortened time on this today     Knee/Hip Exercises: Standing   Heel Raises Both;20 reps;1 second;2 sets  holding 15 lb kettlebell, back against wall   Functional Squat 3 sets;10 reps;Other (comment)  Holding 15 lb kettlebell, back against wall   Walking with Sports Cord Resisted Walking for balance using FreeMotion machine all  4 ways: 20 lbs for bil sidestepping and 20 lbs. for all directions x 5 each way, with mod A holding either sportscord or pts belt throughout.   Other Standing Knee Exercises sidestep with squat 20 feet 2 times each way     Shoulder Exercises: Standing   Other Standing Exercises upright rows, 15 lb. kettlebell x 10 x 3 with back against wall   Other Standing Exercises robber's pose with back toward wall, 5 lb. weight in each hand, raise arms up x 10 x 3                        [facility] Go[facility] - 07/18/16 1216      CC Long Term Goal  #1   Title Patient will be independent in home exercise program for gait and posture   Status Partially Met     CC Long Term Goal  #2   Title Patient will be knowledgeable about the importance of LE strengthening in his  workouts with the trainer at his club   Status Achieved     CC Long Term Goal  #3   Title Patient will improve functional gait assessment score to at least 27/30 for improved safety   Status Achieved     CC Long Term Goal  #4   Title Pt. will be able to demonstrate erect posture with minimal forward head position for 30 second or longer assessment   Status Partially Met     CC Long Term Goal  #5   Title Patient will report he is able to stand and/or walk for >/= 1 hour to tolerate traveling some.   Status On-going            Plan - 07/24/16 1020    Clinical Impression Statement Pt continued to feel challenged from workout today but reported feeling good after. Would like to renew when time comes and continue towards goal of increased endurance.    Rehab Potential Good   Clinical Impairments Affecting Rehab Potential some memory deficits reported on this patient   PT Frequency 2x / week   PT Duration 4 weeks   PT Treatment/Interventions ADLs/Self Care Home Management;Gait training;Stair training;Functional mobility training;Therapeutic exercise;Balance training;Neuromuscular re-education;Patient/family education;Manual lymph drainage;Manual techniques;Passive range of motion   PT Next Visit Plan Will need renewal soon. Cont high level balance activities, including resisted walking, incorporating endurance as well to help pt progress toweards goal of increased endurance. Cont postural strength on foam roll as well.    Consulted and Agree with Plan of Care Patient      Patient will benefit from skilled therapeutic intervention in order to improve the following deficits and impairments:  Decreased balance, Abnormal gait, Increased edema, Decreased strength  Visit Diagnosis: Unsteadiness on feet  Abnormal posture  Other abnormalities of gait and mobility     Problem List Patient Active Problem List   Diagnosis Date Noted  . Solitary pulmonary nodule on lung CT 05/18/2016   . Acquired hypothyroidism 05/18/2016  . Dry mouth 12/22/2014  . Mild cognitive impairment with memory loss 12/22/2014  . Balance disorder 09/22/2014  . 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 Dysuria 07/15/2014  . Dysphagia 06/17/2014  . Drug-induced memory loss ([place]) 06/17/2014  . Cerebral ventriculomegaly 06/08/2014  . Alcohol abuse 06/08/2014  . Dilated pupil 06/04/2014  . Aspiration into airway   . Acute delirium 06/02/2014  . Tongue swelling   . Hyponatremia   . Protein-calorie malnutrition,  severe ([place]) 05/26/2014  . Subacute delirium 05/25/2014  . 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 Confusion caused by a drug ([place]) 05/20/2014  . Protein calorie malnutrition ([place]) 05/20/2014  . Insomnia 05/10/2014  . Leukopenia due to antineoplastic chemotherapy ([place]) 04/28/2014  . Mucositis due to chemotherapy 04/22/2014  . Dehydration 04/22/2014  . Weight loss 04/14/2014  . Chemotherapy-induced nausea 04/14/2014  . 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 Malignant neoplasm of base of tongue ([place]) 03/17/2014  . TIA (transient ischemic attack) 12/29/2012  . Hyperlipemia   . ADHD (attention deficit hyperactivity disorder)     Otelia Limes, PTA 07/24/2016, 10:22 AM  [place], [place], [address] Phone: [phone]   Fax:  [phone]  Name: Dylan Moreno MRN: [id number] Date of Birth: 11-18-1940

## 2016-07-26 ENCOUNTER — Ambulatory Visit: Payer: Medicare Other

## 2016-07-30 ENCOUNTER — Ambulatory Visit: Payer: Medicare Other | Admitting: Physical Therapy

## 2016-07-30 ENCOUNTER — Encounter: Payer: Self-pay | Admitting: Physical Therapy

## 2016-07-30 DIAGNOSIS — R2681 Unsteadiness on feet: Secondary | ICD-10-CM | POA: Diagnosis not present

## 2016-07-30 DIAGNOSIS — R2689 Other abnormalities of gait and mobility: Secondary | ICD-10-CM | POA: Diagnosis not present

## 2016-07-30 DIAGNOSIS — R293 Abnormal posture: Secondary | ICD-10-CM

## 2016-07-30 NOTE — Therapy (Signed)
[place], [place], [address] Phone: [phone]   Fax:  [phone]  Physical Therapy Treatment  Patient Details  Name: Dylan Moreno MRN: [id number] Date of Birth: 08-Aug-1940 Referring Provider: Dr. Heath Lark  Encounter Date: 07/30/2016      PT End of Session - 07/30/16 0958    Visit Number 18   Number of Visits 17   Date for PT Re-Evaluation 07/26/16   PT Start Time 7989  pt arrived late   PT Stop Time 0930   PT Time Calculation (min) 35 min   Activity Tolerance Patient tolerated treatment well   Behavior During Therapy [facility] for tasks assessed/performed      Past Medical History:  Diagnosis Date  . ADHD (attention deficit hyperactivity disorder)   . Anxiety   . Arthritis   . 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 Dysuria 07/15/2014  . GERD (gastroesophageal reflux disease)   . History of radiation therapy 04/06/14- 05/25/14   Base of tongue and bilateral neck 70 Gy in 35 fractions to gross disease, 63 Gy in 35 Fractions to high risk nodal echelons, and 56 Gy in 35 fractions to intermediate risk nodal echelons  . Hypercholesterolemia   . Hyperlipemia   . Hypertension   . Kidney stones   . Peyronie's disease   . TIA (transient ischemic attack)   . TIA (transient ischemic attack) 12/29/12   left facial droop  . Wears glasses     Past Surgical History:  Procedure Laterality Date  . COLONOSCOPY    . CYSTOSCOPY W/ URETEROSCOPY W/ LITHOTRIPSY  2011  . 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   Procedure: DUPUYTREN CONTRACTURE RELEASE RIGHT PALM,RING AND SMALL FINGER;  Surgeon: Cammie Sickle., MD;  Location: [place];  Service: Orthopedics;  Laterality: Right;  . HAND SURGERY     right  . SHOULDER ARTHROSCOPY W/ ROTATOR CUFF REPAIR     left  . TONSILLECTOMY      There were no  vitals filed for this visit.      Subjective Assessment - 07/30/16 0857    Subjective I felt good after last session. I was not sore. I feel a little fatigued after my sessions. I want to get my energy back to where it was before cancer. I love to play golf and it is hard for me to finish the game. When I get up from sitting I may be unsteady.    Pertinent History Diagnosed 3/16 with cancer on base of tongue. He underwent chemotherapy and radiation and completed that on 05/25/14.  He reports swelling in his neck began after radiation around 06/02/14.  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   Patient Stated Goals I want to get back to normal.   Currently in Pain? No/denies   Pain Score 0-No pain                         [facility] Adult PT Treatment/Exercise - 07/30/16 0001      Self-Care   Other Self-Care Comments  Discussed discharge with pt. Pt has met all goals for therapy. He states he is now able to walk for an hour with no problem and can play  18 holes of golf with some fatigue. Educated pt about different exercises to continue to perform with his trainer. Encouraged pt to go to the gym 3x/wk instead of 2. Wrote down several eercises for pt to continue with his trainer including elliptical and cardio as well as high level balance. Educated pt that endurance will take time to build back up but to gradually increase his activity a little more everyday. Pt very receptive and understanding. He is motivated to continue to work on endurance with his trainer and is agreeable to d/c.                         [facility] Goals - 07/30/16 0901      CC Long Term Goal  #1   Title Patient will be independent in home exercise program for gait and posture   Baseline Pt reports doing his HEP but requires remineders to be consistent with this-06/28/16, 07/30/16- pt reports he  is performing his HEP and is working with his trainer consistently   Time 4   Period Weeks   Status Achieved     CC Long Term Goal  #2   Title Patient will be knowledgeable about the importance of LE strengthening in his workouts with the trainer at his club   Time 4   Period Weeks   Status Achieved     CC Long Term Goal  #3   Title Patient will improve functional gait assessment score to at least 27/30 for improved safety   Baseline score of 24/30 on evaluation; scored 28/30 on 06/28/16   Time 4   Period Weeks   Status Achieved     CC Long Term Goal  #4   Title Pt. will be able to demonstrate erect posture with minimal forward head position for 30 second or longer assessment   Baseline Pt able to hold head up with cuing but still struggles with doing so without cuing-06/28/16, 07/30/16- pt still demonstrating forward head while standing and is able to maintain correct posture with cueing but tends to loose this posture while walking.    Time 4   Period Weeks   Status Partially Met     CC Long Term Goal  #5   Title Patient will report he is able to stand and/or walk for >/= 1 hour to tolerate traveling some.   Baseline 07/30/16- pt states he is now able to walk for over an hour   Time 4   Period Weeks   Status Achieved            Plan - 07/30/16 1002    Clinical Impression Statement Assessed pt's progress towards goals in therapy today. Pt has now met all goals for therapy. He states he can now walk for an hour or more without any problem. He is able to play 18 holes of golf for some fatigue at the end. He is going to a trainer 2x/ wk and was educated to increase this to 3x/wk. He was educated to continue to work with the trainer on endurance training including cardio, weights and balance exercises. Pt states understand all of this and is agreeable to discharge because he has met his goals.    Rehab Potential Good   Clinical Impairments Affecting Rehab Potential some memory  deficits reported on this patient   PT Frequency 2x / week   PT Duration 4 weeks   PT Treatment/Interventions ADLs/Self Care Home Management;Gait training;Stair  training;Functional mobility training;Therapeutic exercise;Balance training;Neuromuscular re-education;Patient/family education;Manual lymph drainage;Manual techniques;Passive range of motion   PT Next Visit Plan dc this visit   PT Home Exercise Plan Postural strength, hip flexibility, and cont walking routine.    Consulted and Agree with Plan of Care Patient      Patient will benefit from skilled therapeutic intervention in order to improve the following deficits and impairments:  Decreased balance, Abnormal gait, Increased edema, Decreased strength  Visit Diagnosis: Unsteadiness on feet  Abnormal posture       G-Codes - 08-21-16 1000    Functional Assessment Tool Used (Outpatient Only) clinical judgement   Functional Limitation Mobility: Walking and moving around   Mobility: Walking and Moving Around Goal Status [phone]) At least 1 percent but less than 20 percent impaired, limited or restricted   Mobility: Walking and Moving Around Discharge Status [phone]) At least 1 percent but less than 20 percent impaired, limited or restricted      Problem List Patient Active Problem List   Diagnosis Date Noted  . Solitary pulmonary nodule on lung CT 05/18/2016  . Acquired hypothyroidism 05/18/2016  . Dry mouth 12/22/2014  . Mild cognitive impairment with memory loss 12/22/2014  . Balance disorder 09/22/2014  . 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 Dysuria 07/15/2014  . Dysphagia 06/17/2014  . Drug-induced memory loss ([place]) 06/17/2014  . Cerebral ventriculomegaly 06/08/2014  . Alcohol abuse 06/08/2014  . Dilated pupil 06/04/2014  . Aspiration into airway   . Acute delirium 06/02/2014  . Tongue swelling   . Hyponatremia   . Protein-calorie malnutrition, severe ([place]) 05/26/2014  . Subacute delirium 05/25/2014  . Anemia in  neoplastic disease 05/20/2014  . Confusion caused by a drug ([place]) 05/20/2014  . Protein calorie malnutrition ([place]) 05/20/2014  . Insomnia 05/10/2014  . Leukopenia due to antineoplastic chemotherapy ([place]) 04/28/2014  . Mucositis due to chemotherapy 04/22/2014  . Dehydration 04/22/2014  . Weight loss 04/14/2014  . Chemotherapy-induced nausea 04/14/2014  . 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 Malignant neoplasm of base of tongue ([place]) 03/17/2014  . TIA (transient ischemic attack) 12/29/2012  . Hyperlipemia   . ADHD (attention deficit hyperactivity disorder)     Allyson Sabal [facility] August 21, 2016, 12:11 PM  [place], [place], [address] Phone: [phone]   Fax:  [phone]  Name: JOHNSON ARIZOLA MRN: [id number] Date of Birth: [birth year]  PHYSICAL THERAPY DISCHARGE SUMMARY  Visits from Start of Care: 18 Current functional level related to goals / functional outcomes: See above   Remaining deficits: Pt still demonstrates some forward head but is able to self correct with cueing    Education / Equipment: HEP Plan: Patient agrees to discharge.  Patient goals were met. Patient is being discharged due to meeting the stated rehab goals.  ?????      Allyson Sabal [place], [place] 2016-08-21 12:11 PM

## 2016-08-01 ENCOUNTER — Encounter: Payer: Self-pay | Admitting: Physical Therapy

## 2016-09-07 DIAGNOSIS — R5383 Other fatigue: Secondary | ICD-10-CM | POA: Diagnosis not present

## 2016-09-07 DIAGNOSIS — E875 Hyperkalemia: Secondary | ICD-10-CM | POA: Diagnosis not present

## 2016-09-07 DIAGNOSIS — R55 Syncope and collapse: Secondary | ICD-10-CM | POA: Diagnosis not present

## 2016-09-12 DIAGNOSIS — Z23 Encounter for immunization: Secondary | ICD-10-CM | POA: Diagnosis not present

## 2016-09-13 DIAGNOSIS — E875 Hyperkalemia: Secondary | ICD-10-CM | POA: Diagnosis not present

## 2016-09-17 DIAGNOSIS — R55 Syncope and collapse: Secondary | ICD-10-CM | POA: Diagnosis not present

## 2016-09-19 IMAGING — XA IR PERC PLACEMENT GASTROSTOMY
1 series · 6 of 6 positions shown · non-contrast
Comparison: none

CLINICAL DATA: Base of tongue malignancy, tonsillar carcinoma,
feeding tube prior to head and neck radiation

[Series 300: sp percut gastrostomy tube insert  w/flu · 6 of 6 slices shown]
[im 1/6]
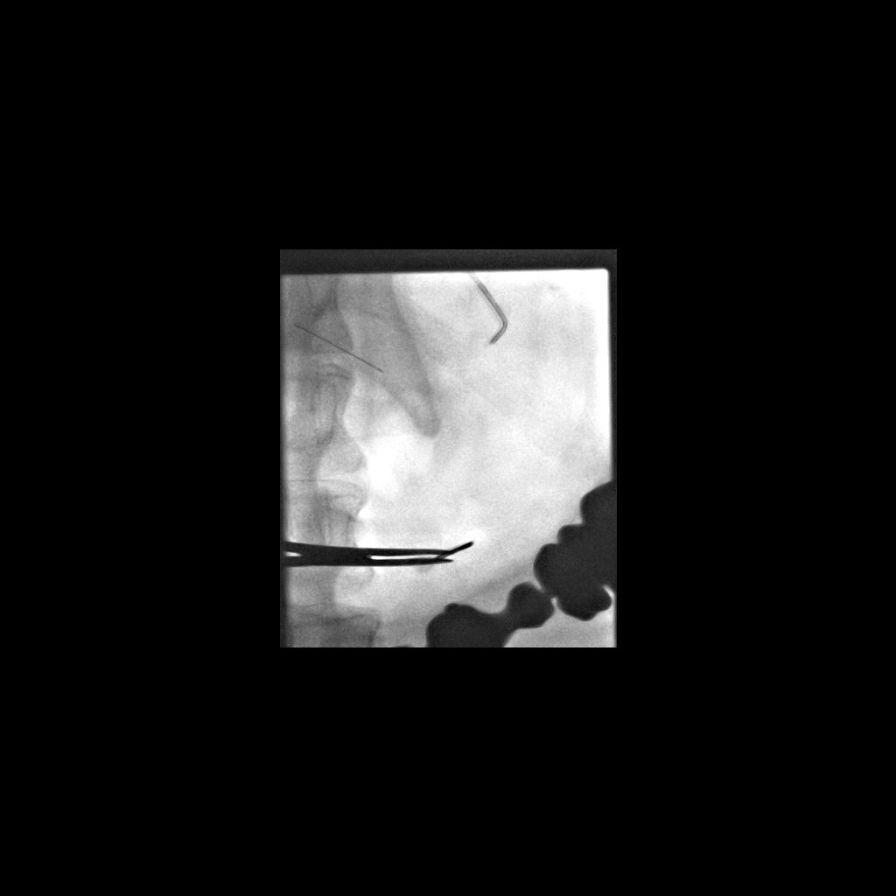
[im 2/6]
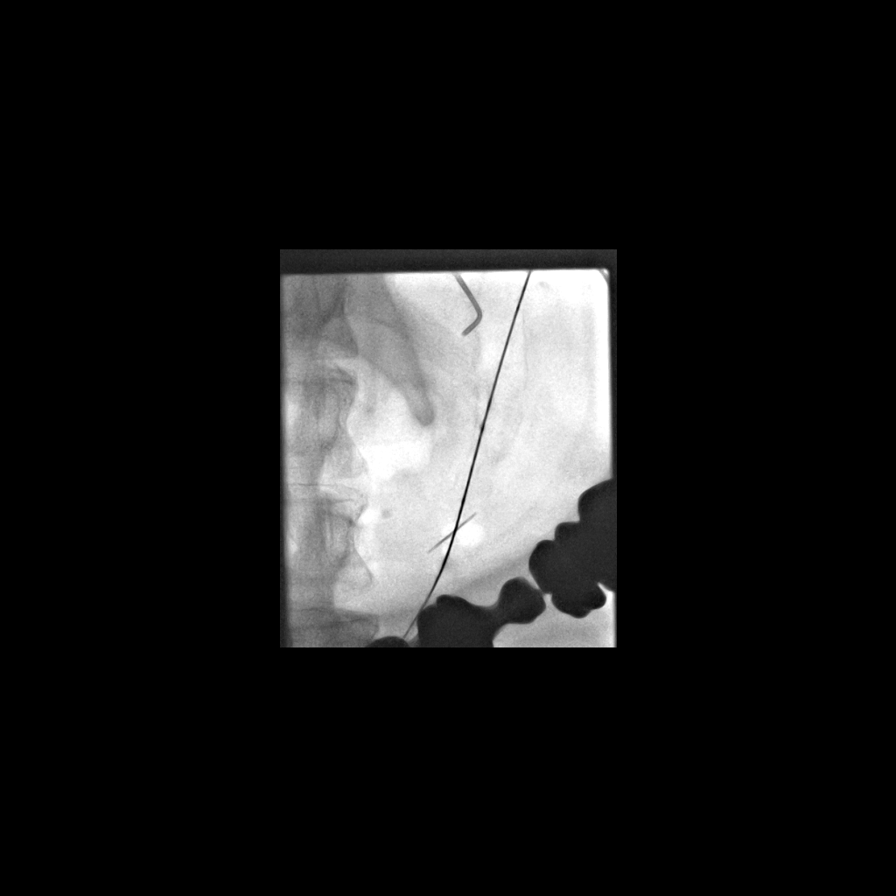
[im 3/6]
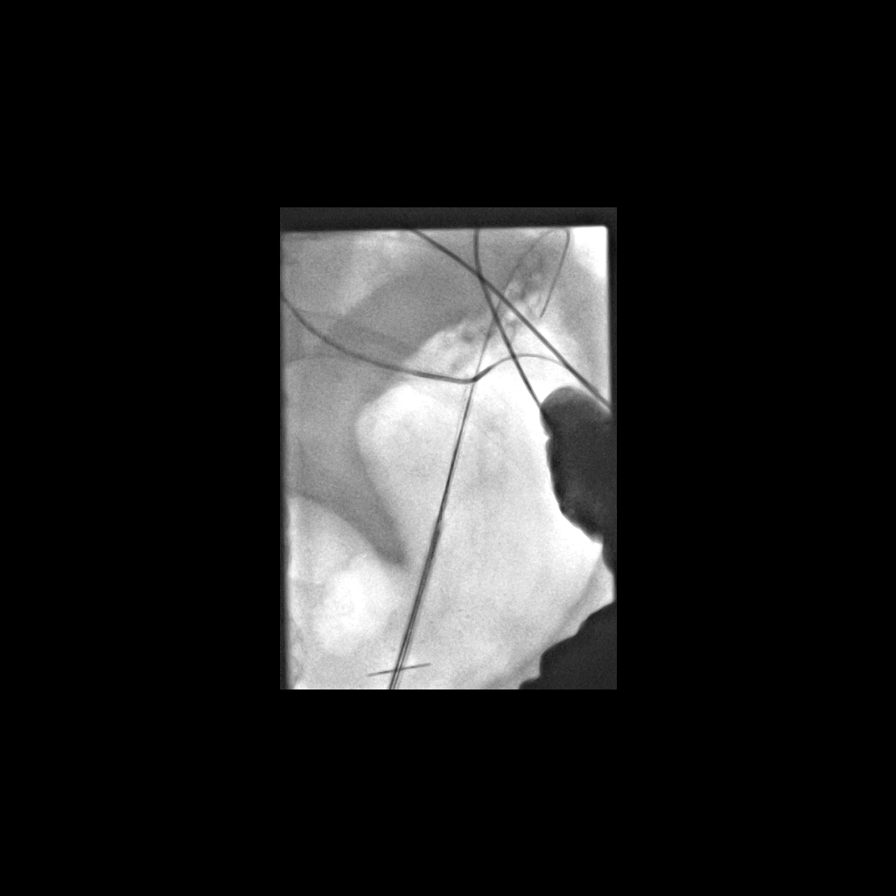
[im 4/6]
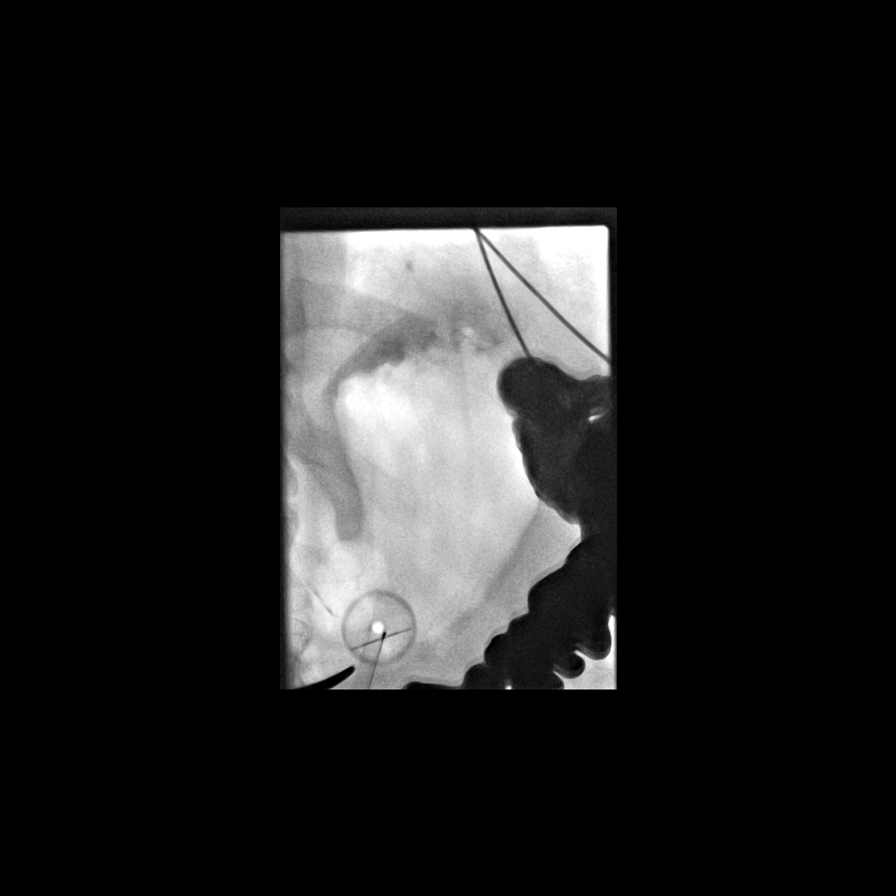
[im 5/6]
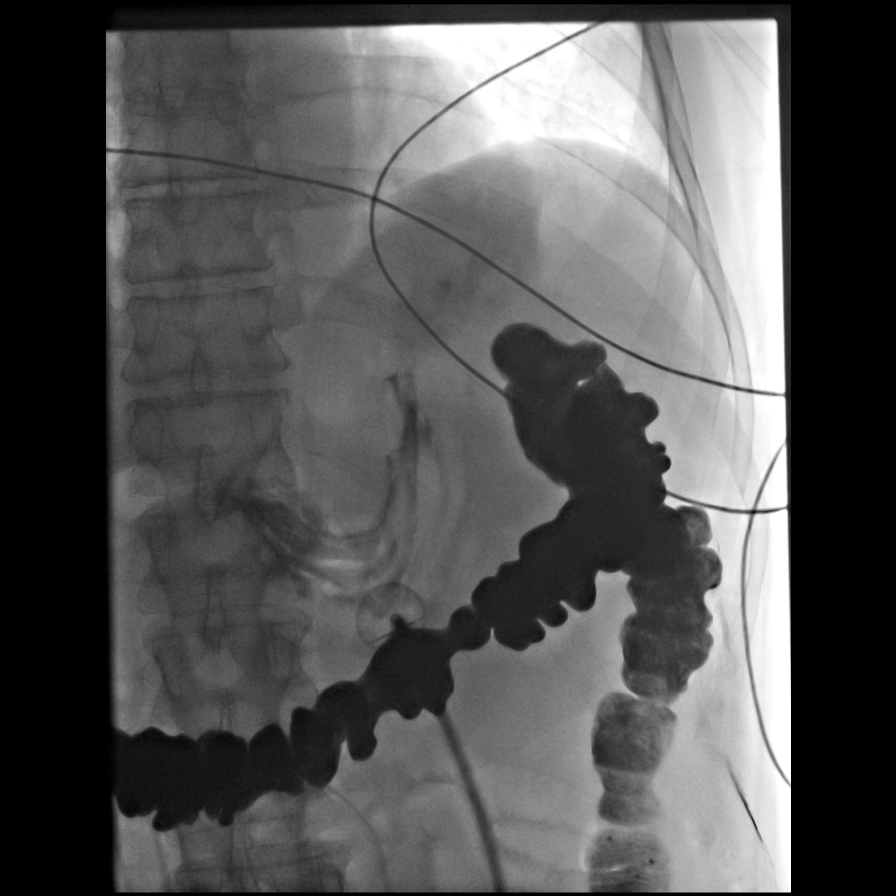
[im 6/6]
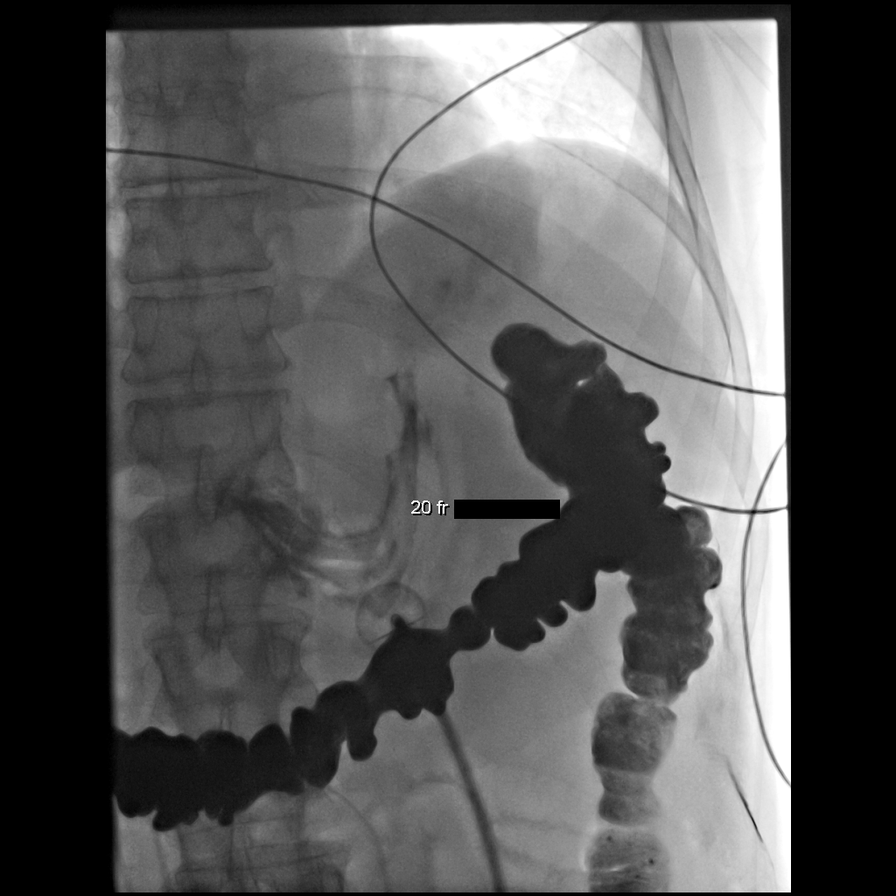

[6 of 6 positions shown; findings below may reference images not displayed]

EXAM:
FLUOROSCOPIC 20 FRENCH PULL-THROUGH GASTROSTOMY

Date:  [DATE] [DATE]

Radiologist:  Simeone, Kimberly

Guidance:  FLUOROSCOPIC

FLUOROSCOPY TIME:  54 second

MEDICATIONS AND MEDICAL HISTORY:
2 g and Sethadministered within 1 hour of the procedure,2 mg Versed,
50 mcg fentanyl

ANESTHESIA/SEDATION:
15 minutes

CONTRAST:  5 cc Omnipaque 300

COMPLICATIONS:
None immediate

PROCEDURE:
Informed consent was obtained from the patient following explanation
of the procedure, risks, benefits and alternatives. The patient
understands, agrees and consents for the procedure. All questions
were addressed. A time out was performed.

Maximal barrier sterile technique utilized including caps, mask,
sterile gowns, sterile gloves, large sterile drape, hand hygiene,
and betadine prep.

The left upper quadrant was sterilely prepped and draped. An oral
gastric catheter was inserted into the stomach under fluoroscopy.
The existing nasogastric feeding tube was removed. Air was injected
into the stomach for insufflation and visualization under
fluoroscopy. The air distended stomach was confirmed beneath the
anterior abdominal wall in the frontal and lateral projections.
Under sterile conditions and local anesthesia, a 17 gauge trocar
needle was utilized to access the stomach percutaneously beneath the
left subcostal margin. Needle position was confirmed within the
stomach under biplane fluoroscopy. Contrast injection confirmed
position also. A single T tack was deployed for gastropexy. Over an
Amplatz guide wire, a 9-French sheath was inserted into the stomach.
A snare device was utilized to capture the oral gastric catheter.
The snare device was pulled retrograde from the stomach up the
esophagus and out the oropharynx. The 20-French pull-through
gastrostomy was connected to the snare device and pulled antegrade
through the oropharynx down the esophagus into the stomach and then
through the percutaneous tract external to the patient. The
gastrostomy was assembled externally. Contrast injection confirms
position in the stomach. Images were obtained for documentation. The
patient tolerated procedure well. No immediate complication.
IMPRESSION: Fluoroscopic insertion of a 20-French "pull-through" gastrostomy.

## 2016-09-19 IMAGING — XA IR FLUORO GUIDE CV LINE*R*
1 series · 1 of 1 positions shown · non-contrast
Comparison: none

CLINICAL DATA: Base of tongue neoplasm, access for chemotherapy

[Series 300: line placements · 1 of 1 slices shown]
[im 1/1]
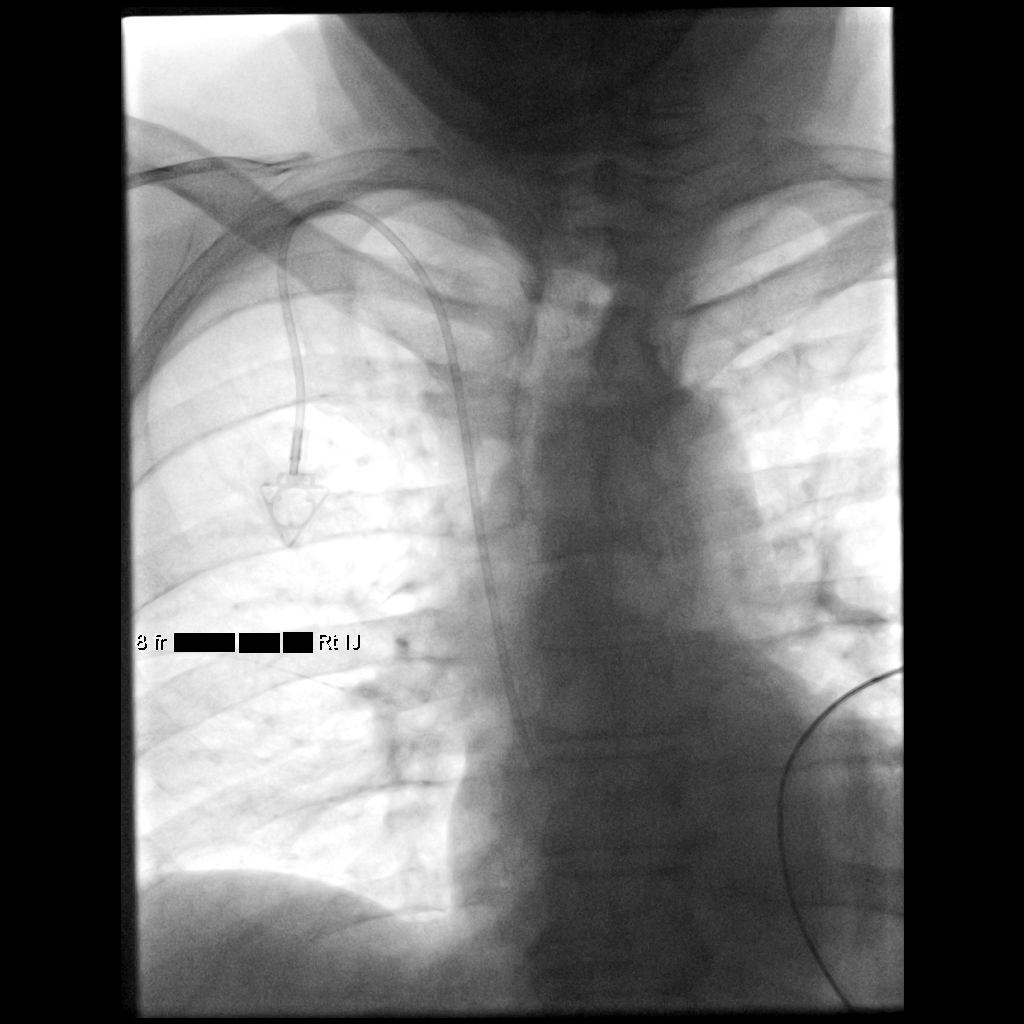

[1 of 1 positions shown; findings below may reference images not displayed]

EXAM:
RIGHT INTERNAL JUGULAR SINGLE LUMEN POWER PORT CATHETER INSERTION

Date:  [DATE] [DATE]

Radiologist:  Speights, Rohan

Guidance:  Ultrasound and fluoroscopic

FLUOROSCOPY TIME:  24 seconds

MEDICATIONS AND MEDICAL HISTORY:
2 g Ancefadministered within 1 hour of the procedure.3 mg Versed,
95mcg fentanyl

ANESTHESIA/SEDATION:
27 minutes

CONTRAST:  None.

COMPLICATIONS:
None immediate

PROCEDURE:
Informed consent was obtained from the patient following explanation
of the procedure, risks, benefits and alternatives. The patient
understands, agrees and consents for the procedure. All questions
were addressed. A time out was performed.

Maximal barrier sterile technique utilized including caps, mask,
sterile gowns, sterile gloves, large sterile drape, hand hygiene,
and 2% chlorhexidine scrub.

Under sterile conditions and local anesthesia, right internal
jugular micropuncture venous access was performed. Access was
performed with ultrasound. Images were obtained for documentation. A
guide wire was inserted followed by a transitional dilator. This
allowed insertion of a guide wire and catheter into the IVC.
Measurements were obtained from the SVC / RA junction back to the
right IJ venotomy site. In the right infraclavicular chest, a
subcutaneous pocket was created over the second anterior rib. This
was done under sterile conditions and local anesthesia. 1% lidocaine
with epinephrine was utilized for this. A 2.5 cm incision was made
in the skin. Blunt dissection was performed to create a subcutaneous
pocket over the right pectoralis major muscle. The pocket was
flushed with saline vigorously. There was adequate hemostasis. The
port catheter was assembled and checked for leakage. The port
catheter was secured in the pocket with two retention sutures. The
tubing was tunneled subcutaneously to the right venotomy site and
inserted into the SVC/RA junction through a valved peel-away sheath.
Position was confirmed with fluoroscopy. Images were obtained for
documentation. The patient tolerated the procedure well. No
immediate complications. Incisions were closed in a two layer
fashion with 4 - 0 Vicryl suture. Dermabond was applied to the skin.
The port catheter was accessed, blood was aspirated followed by
saline and heparin flushes. Needle was removed. A dry sterile
dressing was applied.
IMPRESSION: Ultrasound and fluoroscopically guided right internal jugular single
lumen power port catheter insertion. Tip in the SVC/RA junction.
Catheter ready for use.

## 2016-09-28 DIAGNOSIS — R269 Unspecified abnormalities of gait and mobility: Secondary | ICD-10-CM | POA: Diagnosis not present

## 2016-09-28 DIAGNOSIS — M6281 Muscle weakness (generalized): Secondary | ICD-10-CM | POA: Diagnosis not present

## 2016-10-05 DIAGNOSIS — R269 Unspecified abnormalities of gait and mobility: Secondary | ICD-10-CM | POA: Diagnosis not present

## 2016-10-05 DIAGNOSIS — M6281 Muscle weakness (generalized): Secondary | ICD-10-CM | POA: Diagnosis not present

## 2016-10-09 DIAGNOSIS — R269 Unspecified abnormalities of gait and mobility: Secondary | ICD-10-CM | POA: Diagnosis not present

## 2016-10-09 DIAGNOSIS — M6281 Muscle weakness (generalized): Secondary | ICD-10-CM | POA: Diagnosis not present

## 2016-10-12 DIAGNOSIS — R269 Unspecified abnormalities of gait and mobility: Secondary | ICD-10-CM | POA: Diagnosis not present

## 2016-10-12 DIAGNOSIS — M6281 Muscle weakness (generalized): Secondary | ICD-10-CM | POA: Diagnosis not present

## 2016-10-16 DIAGNOSIS — M6281 Muscle weakness (generalized): Secondary | ICD-10-CM | POA: Diagnosis not present

## 2016-10-16 DIAGNOSIS — R269 Unspecified abnormalities of gait and mobility: Secondary | ICD-10-CM | POA: Diagnosis not present

## 2016-10-19 DIAGNOSIS — M6281 Muscle weakness (generalized): Secondary | ICD-10-CM | POA: Diagnosis not present

## 2016-10-19 DIAGNOSIS — R269 Unspecified abnormalities of gait and mobility: Secondary | ICD-10-CM | POA: Diagnosis not present

## 2016-10-23 DIAGNOSIS — R269 Unspecified abnormalities of gait and mobility: Secondary | ICD-10-CM | POA: Diagnosis not present

## 2016-10-23 DIAGNOSIS — M6281 Muscle weakness (generalized): Secondary | ICD-10-CM | POA: Diagnosis not present

## 2016-10-25 DIAGNOSIS — R269 Unspecified abnormalities of gait and mobility: Secondary | ICD-10-CM | POA: Diagnosis not present

## 2016-10-25 DIAGNOSIS — M6281 Muscle weakness (generalized): Secondary | ICD-10-CM | POA: Diagnosis not present

## 2016-10-30 DIAGNOSIS — R269 Unspecified abnormalities of gait and mobility: Secondary | ICD-10-CM | POA: Diagnosis not present

## 2016-10-30 DIAGNOSIS — M6281 Muscle weakness (generalized): Secondary | ICD-10-CM | POA: Diagnosis not present

## 2016-11-02 DIAGNOSIS — M6281 Muscle weakness (generalized): Secondary | ICD-10-CM | POA: Diagnosis not present

## 2016-11-02 DIAGNOSIS — R269 Unspecified abnormalities of gait and mobility: Secondary | ICD-10-CM | POA: Diagnosis not present

## 2016-11-06 DIAGNOSIS — R269 Unspecified abnormalities of gait and mobility: Secondary | ICD-10-CM | POA: Diagnosis not present

## 2016-11-06 DIAGNOSIS — M6281 Muscle weakness (generalized): Secondary | ICD-10-CM | POA: Diagnosis not present

## 2016-11-09 DIAGNOSIS — M6281 Muscle weakness (generalized): Secondary | ICD-10-CM | POA: Diagnosis not present

## 2016-11-09 DIAGNOSIS — R269 Unspecified abnormalities of gait and mobility: Secondary | ICD-10-CM | POA: Diagnosis not present

## 2016-11-13 DIAGNOSIS — R269 Unspecified abnormalities of gait and mobility: Secondary | ICD-10-CM | POA: Diagnosis not present

## 2016-11-13 DIAGNOSIS — M6281 Muscle weakness (generalized): Secondary | ICD-10-CM | POA: Diagnosis not present

## 2016-11-16 DIAGNOSIS — R269 Unspecified abnormalities of gait and mobility: Secondary | ICD-10-CM | POA: Diagnosis not present

## 2016-11-16 DIAGNOSIS — M6281 Muscle weakness (generalized): Secondary | ICD-10-CM | POA: Diagnosis not present

## 2016-11-20 DIAGNOSIS — R269 Unspecified abnormalities of gait and mobility: Secondary | ICD-10-CM | POA: Diagnosis not present

## 2016-11-20 DIAGNOSIS — M6281 Muscle weakness (generalized): Secondary | ICD-10-CM | POA: Diagnosis not present

## 2016-11-27 DIAGNOSIS — R269 Unspecified abnormalities of gait and mobility: Secondary | ICD-10-CM | POA: Diagnosis not present

## 2016-11-27 DIAGNOSIS — M6281 Muscle weakness (generalized): Secondary | ICD-10-CM | POA: Diagnosis not present

## 2016-12-05 DIAGNOSIS — R269 Unspecified abnormalities of gait and mobility: Secondary | ICD-10-CM | POA: Diagnosis not present

## 2016-12-05 DIAGNOSIS — M6281 Muscle weakness (generalized): Secondary | ICD-10-CM | POA: Diagnosis not present

## 2016-12-06 ENCOUNTER — Telehealth: Payer: Self-pay | Admitting: Neurology

## 2016-12-06 NOTE — Telephone Encounter (Signed)
Patient's wife called very concerned about her husband. She said he is consuming a lot of alcohol and not remember doing it. He is also has been very verbally abusive to her. He was recently just this year diagnosed with Dementia. Please Call. Thanks

## 2016-12-07 ENCOUNTER — Telehealth: Payer: Self-pay | Admitting: Neurology

## 2016-12-07 DIAGNOSIS — R269 Unspecified abnormalities of gait and mobility: Secondary | ICD-10-CM | POA: Diagnosis not present

## 2016-12-07 DIAGNOSIS — M6281 Muscle weakness (generalized): Secondary | ICD-10-CM | POA: Diagnosis not present

## 2016-12-07 NOTE — Telephone Encounter (Signed)
See previous note

## 2016-12-07 NOTE — Telephone Encounter (Signed)
Patient's wife given all information.  She would like for him to be put on our waiting list.

## 2016-12-07 NOTE — Telephone Encounter (Signed)
OK to place on wait list, as I do not have any immediate openings.  Would recommend that she take him to see Dr. Delfina Redwood to address alcohol-related issues and evaluate for other causes of worsening memory (such as UTI) and we can address dementia.

## 2016-12-07 NOTE — Telephone Encounter (Signed)
Left message for patients wife to call me back.

## 2016-12-07 NOTE — Telephone Encounter (Signed)
Dylan Moreno wants him to be seen.

## 2016-12-07 NOTE — Telephone Encounter (Signed)
Patient's wife lmom returning your call. She said she will be in a meeting so may not be able to answer if you call back. She said if you would leave any information for her where she may can bring him in to see Dr. Posey Pronto. Thanks

## 2016-12-11 DIAGNOSIS — R269 Unspecified abnormalities of gait and mobility: Secondary | ICD-10-CM | POA: Diagnosis not present

## 2016-12-11 DIAGNOSIS — M6281 Muscle weakness (generalized): Secondary | ICD-10-CM | POA: Diagnosis not present

## 2016-12-14 DIAGNOSIS — R269 Unspecified abnormalities of gait and mobility: Secondary | ICD-10-CM | POA: Diagnosis not present

## 2016-12-14 DIAGNOSIS — M6281 Muscle weakness (generalized): Secondary | ICD-10-CM | POA: Diagnosis not present

## 2016-12-18 DIAGNOSIS — M6281 Muscle weakness (generalized): Secondary | ICD-10-CM | POA: Diagnosis not present

## 2016-12-18 DIAGNOSIS — R269 Unspecified abnormalities of gait and mobility: Secondary | ICD-10-CM | POA: Diagnosis not present

## 2016-12-21 DIAGNOSIS — M6281 Muscle weakness (generalized): Secondary | ICD-10-CM | POA: Diagnosis not present

## 2016-12-21 DIAGNOSIS — R269 Unspecified abnormalities of gait and mobility: Secondary | ICD-10-CM | POA: Diagnosis not present

## 2016-12-24 DIAGNOSIS — R269 Unspecified abnormalities of gait and mobility: Secondary | ICD-10-CM | POA: Diagnosis not present

## 2016-12-24 DIAGNOSIS — M6281 Muscle weakness (generalized): Secondary | ICD-10-CM | POA: Diagnosis not present

## 2017-02-14 ENCOUNTER — Other Ambulatory Visit: Payer: Self-pay | Admitting: *Deleted

## 2017-02-14 MED ORDER — DONEPEZIL HCL 10 MG PO TABS: ORAL_TABLET | ORAL | 3 refills | Status: DC

## 2017-03-06 ENCOUNTER — Ambulatory Visit (INDEPENDENT_AMBULATORY_CARE_PROVIDER_SITE_OTHER): Payer: Medicare Other | Admitting: Neurology

## 2017-03-06 ENCOUNTER — Other Ambulatory Visit: Payer: Medicare Other

## 2017-03-06 VITALS — BP 106/60 | HR 82 | Ht 74.0 in | Wt 182.0 lb

## 2017-03-06 DIAGNOSIS — G621 Alcoholic polyneuropathy: Secondary | ICD-10-CM | POA: Diagnosis not present

## 2017-03-06 DIAGNOSIS — F0280 Dementia in other diseases classified elsewhere without behavioral disturbance: Secondary | ICD-10-CM

## 2017-03-06 DIAGNOSIS — C01 Malignant neoplasm of base of tongue: Secondary | ICD-10-CM | POA: Diagnosis not present

## 2017-03-06 DIAGNOSIS — G3184 Mild cognitive impairment, so stated: Secondary | ICD-10-CM | POA: Diagnosis not present

## 2017-03-06 NOTE — Patient Instructions (Addendum)
Strongly encourage you to stop drinking alcohol  Check labs. Your provider has requested that you have labwork completed today. Please go to [facility] ([id number]) on the second floor of this building before leaving the office today. You do not need to check in. If you are not called within 15 minutes please check with the front desk.   Start physical therapy for balance training  Return to clinic in 9 months

## 2017-03-06 NOTE — Progress Notes (Signed)
Follow-up Visit   Date: 03/06/17    [name] MRN: [id number] DOB: 1940-12-30   Interim History: Dylan Moreno is a 77 y.o. right-handed Caucasian male with history of hyperlipidemia, tobacco use, ADD, TIA (12/29/2012), squamous cell carcinoma s/p chemo/radiation (Spring 2016) returning to the clinic for complaints of memory impairment.  The patient was accompanied to the clinic by wife.  History of present illness: He presented to the Emergency Department on 12/29/2012 with acute onset of left facial droop, lasting one hour. MRI/A of the brain did not show acute stroke. There was a note of ventriculomegaly suggestive of NPH. US carotids, echo, and holter was normal. He was previously taking lipitor 10mg  and was taking aspirin 81mg  daily. He was discharged on aspirin 325mg .  However, upon follow-up visit with me, I switched him to plavix 75mg  as he was already taking aspirin.  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 Neuropsychological testing with Dr. Valentina Shaggy showed neurocognitive disorder, unspecified, and ADHD.  He was cleared to drive by occupational therapy.   In 2018, he started having greater difficulty using his phone and remote control, as well as increased forgetfulness with events/conversations.  He tends to be irritable at night, which he wife attributes to his drinking.  Despite my recommendation to abstain from alcohol, he continues to consume 2-3 drinks nightly.  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  He has not been compliant with his medications for ADHD.   His wife states that he continues to drink 3-4 oz of gin nightly.  He disagrees with his wife about his alcohol consumption and gets easily irritated  when this is mentioned.     UPDATE 03/06/2017:  He is here for follow-up visit.  He moved in independent living at [facility] in December 2018.  He has enjoyed living there and has more social engagements.  Over the past few months, he has complained of imbalance and "fuzzy thinking". He sometimes forgets details of a conversation.  He manages his own medications, continues to drive, and prepares his meals.  Wife and business partner manage his financial affairs. Overall, there has not been a marked change in his cognition.  They are more concerned about his balance.  He has suffered a few falls, mostly in the setting of alcohol use.  Wife and business partner feel that he continues to drink too much, whereas patient states he only drinks 1.5oz of gin nightly.  Family feel that he is in denial with his alcohol abuse.  Patient is willing to try to quit.     Medications:  Current Outpatient Medications on File Prior to Visit  Medication Sig Dispense Refill  . amphetamine-dextroamphetamine (ADDERALL) 20 MG tablet Take 20 mg by mouth daily. Reported on 12/22/2014    . atorvastatin (LIPITOR) 20 MG tablet Place 1 tablet (20 mg total) into feeding tube daily at 6 PM. (Patient taking differently: Take 20 mg by mouth daily at 6 PM. ) 30 tablet 0  . clopidogrel (PLAVIX) 75 MG tablet Place 1 tablet (75 mg total) into feeding tube daily. (Patient taking differently: Take 75 mg by mouth daily. ) 30 tablet 0  . donepezil (ARICEPT) 10 MG tablet 1 tablet daily. 90 tablet 3  .  levothyroxine (SYNTHROID, LEVOTHROID) 75 MCG tablet Place 1 tablet (75 mcg total) into feeding tube daily before breakfast. (Patient taking differently: Take 75 mcg by mouth daily before breakfast. ) 30 tablet 0  . sodium fluoride (PREVIDENT 5000 PLUS) 1.1 % CREA dental cream Apply to tooth brush. Brush teeth for 2 minutes. Spit out excess-DO NOT swallow. Repeat nightly. 1 Tube prn  . tamsulosin (FLOMAX) 0.4 MG CAPS capsule Take 0.4 mg by mouth  daily.  3  . thiamine 100 MG tablet Take 100 mg by mouth daily.      No current facility-administered medications on file prior to visit.     Allergies: No Known Allergies   Review of Systems:  CONSTITUTIONAL: No fevers, chills, night sweats, or weight loss.   EYES: No visual changes or eye pain ENT: No hearing changes.  No history of nose bleeds.   RESPIRATORY: No cough, wheezing and shortness of breath.   CARDIOVASCULAR: Negative for chest pain, and palpitations.   GI: Negative for abdominal discomfort, blood in stools or black stools.  No recent change in bowel habits.   GU:  No history of incontinence.   MUSCLOSKELETAL: No history of joint pain or swelling.  No myalgias.   SKIN: Negative for lesions, rash, and itching.   ENDOCRINE: Negative for cold or heat intolerance, polydipsia or goiter.   PSYCH:  No depression or anxiety symptoms.   NEURO: As Above.   Vital Signs:  BP 106/60   Pulse 82   Ht 6\' 2"  (1.88 m)   Wt 182 lb (82.6 kg)   SpO2 97%   BMI 23.37 kg/m   Neurological Exam: MENTAL STATUS:  Alert, awake, and easily engages in conversation. Well groomed and dressed. Oriented x5. Speech is not dysarthric, but there is intermittent stuttering of speech (old)  CRANIAL NERVES: Pupils round and reactive to light.  Normal conjugate, extra-ocular eye movements in all directions of gaze.  No ptosis. Face is symmetric. Palate elevates symmetrically.  Tongue is midline.  MOTOR:  Motor strength is 5/5 in all extremities, except 4+/5 left toe extensors and 5-/5 left dorsiflexion.  REFLEXES:  2+/4 throughout and absent at the Achilles.  SENSORY:  Vibration is mildly reduced at the ankles.  Rhomberg sign is positive.   COORDINATION/GAIT:  Gait appears stable, unassisted.  Data: US carotids 12/30/2012: Findings suggest 1-39% internal carotid artery stenosis bilaterally. Vertebral arteries are patent with antegrade flow.   MRI/A brain 12/29/2012:  1. Ventricular  prominence is disproportionate to the degree of atrophy, suggesting normal pressure hydrocephalus.  2. No other acute intracranial abnormality. Specifically, there is no evidence for acute or subacute infarct.  3. Mild distal small vessel disease is evident on the MRA without significant proximal stenosis, aneurysm, or branch vessel occlusion.   48-hr holter:  NSR, rare PVCs  CT head 06/04/2014: Ventricular prominence similar to prior MR suggesting normal pressure hydrocephalus. No intracranial hemorrhage. No CT evidence of large acute infarct. No intracranial mass or abnormal enhancement.  CTA head and neck 06/04/2014:   Calcified plaque with mild to slightly moderate narrowing cavernous segment internal carotid artery bilaterally. Mild narrowing and irregularity M1 segment left middle cerebral artery. Mild narrowing distal left vertebral artery.  MRI lumbar spine wo contrast 09/30/2014: 1. Minimal left foraminal narrowing at L4-5 and L5-S1 secondary to leftward disc protrusions and facet hypertrophy. 2. Leftward disc protrusion at L3-4 without significant stenosis.  Neuropsychological testing 12/03/2014:  Unspecified mild neurocognitive disorder, Attention Deficit Hyperactivity disorder by history  Neuropsychological  testing 02/16/2016:  Mild dementia (Alzheimer's disease versus alcohol related dementia) superimposed on longstanding ADHD. Marland Kitchen IMPRESSION: 1.  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  Alcoholic peripheral neuropathy causing sensory ataxia  - Start physical therapy for gait training  - Check vitamin B12, MMA, vitamin B1, folate  3.  Alcohol abuse contributing to #1 and #2  - Patient counseled at length about alcohol cessation   4.  Ventricular prominence on CT head which as been stable since 2014.  No  clinical signs of NPH - although he had dementia, gait does not appear magnetic and he denies incontinence.  Will continue to follow and if there are progressive changes to his gait, will consider evaluation for NPH.   5. 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  Return to clinic in 9 months  Greater than 50% of this 30 minute visit was spent in counseling, explanation of diagnosis, planning of further management, and coordination of care.   Thank you for allowing me to participate in patient's care.  If I can answer any additional questions, I would be pleased to do so.    Sincerely,    Kasia Trego K. Posey Pronto, DO

## 2017-03-07 ENCOUNTER — Encounter: Payer: Self-pay | Admitting: Neurology

## 2017-03-10 LAB — METHYLMALONIC ACID, SERUM: Methylmalonic Acid, Quant: 144 nmol/L (ref 87–318)

## 2017-03-10 LAB — VITAMIN B12: VITAMIN B 12: 407 pg/mL (ref 200–1100)

## 2017-03-10 LAB — VITAMIN B1: Vitamin B1 (Thiamine): 50 nmol/L — ABNORMAL HIGH (ref 8–30)

## 2017-03-10 LAB — FOLATE: FOLATE: 11.2 ng/mL

## 2017-03-11 ENCOUNTER — Telehealth: Payer: Self-pay | Admitting: *Deleted

## 2017-03-11 NOTE — Telephone Encounter (Signed)
-----   Message from Alda Berthold, DO sent at 03/11/2017  9:07 AM EDT ----- Please call patient's wife and let her know his labs are normal - patient requested to inform her. Thanks.

## 2017-03-11 NOTE — Telephone Encounter (Signed)
Left message notifying patient's wife.

## 2017-03-12 DIAGNOSIS — R278 Other lack of coordination: Secondary | ICD-10-CM | POA: Diagnosis not present

## 2017-03-12 DIAGNOSIS — Z9181 History of falling: Secondary | ICD-10-CM | POA: Diagnosis not present

## 2017-03-12 DIAGNOSIS — R41841 Cognitive communication deficit: Secondary | ICD-10-CM | POA: Diagnosis not present

## 2017-03-12 DIAGNOSIS — R2681 Unsteadiness on feet: Secondary | ICD-10-CM | POA: Diagnosis not present

## 2017-03-13 DIAGNOSIS — R278 Other lack of coordination: Secondary | ICD-10-CM | POA: Diagnosis not present

## 2017-03-13 DIAGNOSIS — R2681 Unsteadiness on feet: Secondary | ICD-10-CM | POA: Diagnosis not present

## 2017-03-13 DIAGNOSIS — R41841 Cognitive communication deficit: Secondary | ICD-10-CM | POA: Diagnosis not present

## 2017-03-13 DIAGNOSIS — Z9181 History of falling: Secondary | ICD-10-CM | POA: Diagnosis not present

## 2017-03-15 DIAGNOSIS — R278 Other lack of coordination: Secondary | ICD-10-CM | POA: Diagnosis not present

## 2017-03-15 DIAGNOSIS — R2681 Unsteadiness on feet: Secondary | ICD-10-CM | POA: Diagnosis not present

## 2017-03-15 DIAGNOSIS — Z9181 History of falling: Secondary | ICD-10-CM | POA: Diagnosis not present

## 2017-03-15 DIAGNOSIS — R41841 Cognitive communication deficit: Secondary | ICD-10-CM | POA: Diagnosis not present

## 2017-03-19 DIAGNOSIS — R41841 Cognitive communication deficit: Secondary | ICD-10-CM | POA: Diagnosis not present

## 2017-03-19 DIAGNOSIS — R278 Other lack of coordination: Secondary | ICD-10-CM | POA: Diagnosis not present

## 2017-03-19 DIAGNOSIS — R2681 Unsteadiness on feet: Secondary | ICD-10-CM | POA: Diagnosis not present

## 2017-03-19 DIAGNOSIS — Z9181 History of falling: Secondary | ICD-10-CM | POA: Diagnosis not present

## 2017-03-21 DIAGNOSIS — R2681 Unsteadiness on feet: Secondary | ICD-10-CM | POA: Diagnosis not present

## 2017-03-21 DIAGNOSIS — R41841 Cognitive communication deficit: Secondary | ICD-10-CM | POA: Diagnosis not present

## 2017-03-21 DIAGNOSIS — Z9181 History of falling: Secondary | ICD-10-CM | POA: Diagnosis not present

## 2017-03-21 DIAGNOSIS — R278 Other lack of coordination: Secondary | ICD-10-CM | POA: Diagnosis not present

## 2017-03-26 DIAGNOSIS — Z9181 History of falling: Secondary | ICD-10-CM | POA: Diagnosis not present

## 2017-03-26 DIAGNOSIS — R278 Other lack of coordination: Secondary | ICD-10-CM | POA: Diagnosis not present

## 2017-03-26 DIAGNOSIS — R41841 Cognitive communication deficit: Secondary | ICD-10-CM | POA: Diagnosis not present

## 2017-03-26 DIAGNOSIS — R2681 Unsteadiness on feet: Secondary | ICD-10-CM | POA: Diagnosis not present

## 2017-03-28 DIAGNOSIS — R278 Other lack of coordination: Secondary | ICD-10-CM | POA: Diagnosis not present

## 2017-03-28 DIAGNOSIS — R41841 Cognitive communication deficit: Secondary | ICD-10-CM | POA: Diagnosis not present

## 2017-03-28 DIAGNOSIS — Z9181 History of falling: Secondary | ICD-10-CM | POA: Diagnosis not present

## 2017-03-28 DIAGNOSIS — R2681 Unsteadiness on feet: Secondary | ICD-10-CM | POA: Diagnosis not present

## 2017-04-02 DIAGNOSIS — R41841 Cognitive communication deficit: Secondary | ICD-10-CM | POA: Diagnosis not present

## 2017-04-02 DIAGNOSIS — R278 Other lack of coordination: Secondary | ICD-10-CM | POA: Diagnosis not present

## 2017-04-02 DIAGNOSIS — Z9181 History of falling: Secondary | ICD-10-CM | POA: Diagnosis not present

## 2017-04-02 DIAGNOSIS — R2681 Unsteadiness on feet: Secondary | ICD-10-CM | POA: Diagnosis not present

## 2017-04-04 DIAGNOSIS — R2681 Unsteadiness on feet: Secondary | ICD-10-CM | POA: Diagnosis not present

## 2017-04-04 DIAGNOSIS — Z9181 History of falling: Secondary | ICD-10-CM | POA: Diagnosis not present

## 2017-04-04 DIAGNOSIS — R278 Other lack of coordination: Secondary | ICD-10-CM | POA: Diagnosis not present

## 2017-04-04 DIAGNOSIS — R41841 Cognitive communication deficit: Secondary | ICD-10-CM | POA: Diagnosis not present

## 2017-04-05 DIAGNOSIS — R41841 Cognitive communication deficit: Secondary | ICD-10-CM | POA: Diagnosis not present

## 2017-04-05 DIAGNOSIS — Z9181 History of falling: Secondary | ICD-10-CM | POA: Diagnosis not present

## 2017-04-05 DIAGNOSIS — R2681 Unsteadiness on feet: Secondary | ICD-10-CM | POA: Diagnosis not present

## 2017-04-05 DIAGNOSIS — R278 Other lack of coordination: Secondary | ICD-10-CM | POA: Diagnosis not present

## 2017-04-09 DIAGNOSIS — R41841 Cognitive communication deficit: Secondary | ICD-10-CM | POA: Diagnosis not present

## 2017-04-09 DIAGNOSIS — R278 Other lack of coordination: Secondary | ICD-10-CM | POA: Diagnosis not present

## 2017-04-09 DIAGNOSIS — Z9181 History of falling: Secondary | ICD-10-CM | POA: Diagnosis not present

## 2017-04-09 DIAGNOSIS — R2681 Unsteadiness on feet: Secondary | ICD-10-CM | POA: Diagnosis not present

## 2017-04-29 DIAGNOSIS — N401 Enlarged prostate with lower urinary tract symptoms: Secondary | ICD-10-CM | POA: Diagnosis not present

## 2017-04-29 DIAGNOSIS — F908 Attention-deficit hyperactivity disorder, other type: Secondary | ICD-10-CM | POA: Diagnosis not present

## 2017-04-29 DIAGNOSIS — C760 Malignant neoplasm of head, face and neck: Secondary | ICD-10-CM | POA: Diagnosis not present

## 2017-04-29 DIAGNOSIS — Z1389 Encounter for screening for other disorder: Secondary | ICD-10-CM | POA: Diagnosis not present

## 2017-04-29 DIAGNOSIS — Z0000 Encounter for general adult medical examination without abnormal findings: Secondary | ICD-10-CM | POA: Diagnosis not present

## 2017-04-29 DIAGNOSIS — R413 Other amnesia: Secondary | ICD-10-CM | POA: Diagnosis not present

## 2017-04-29 DIAGNOSIS — G459 Transient cerebral ischemic attack, unspecified: Secondary | ICD-10-CM | POA: Diagnosis not present

## 2017-04-29 DIAGNOSIS — E039 Hypothyroidism, unspecified: Secondary | ICD-10-CM | POA: Diagnosis not present

## 2017-04-29 DIAGNOSIS — E7800 Pure hypercholesterolemia, unspecified: Secondary | ICD-10-CM | POA: Diagnosis not present

## 2017-05-09 ENCOUNTER — Telehealth: Payer: Self-pay | Admitting: Hematology and Oncology

## 2017-05-09 NOTE — Telephone Encounter (Signed)
Called regarding 5/15

## 2017-05-15 ENCOUNTER — Encounter (HOSPITAL_COMMUNITY): Payer: Self-pay

## 2017-05-15 ENCOUNTER — Ambulatory Visit (HOSPITAL_COMMUNITY)
Admission: RE | Admit: 2017-05-15 | Discharge: 2017-05-15 | Disposition: A | Discharge status: Home / Self Care | Payer: Medicare Other | Source: Ambulatory Visit | Attending: Hematology and Oncology | Admitting: Hematology and Oncology

## 2017-05-15 ENCOUNTER — Inpatient Hospital Stay: Payer: Medicare Other | Attending: Hematology and Oncology

## 2017-05-15 DIAGNOSIS — Z9221 Personal history of antineoplastic chemotherapy: Secondary | ICD-10-CM | POA: Insufficient documentation

## 2017-05-15 DIAGNOSIS — R911 Solitary pulmonary nodule: Secondary | ICD-10-CM | POA: Insufficient documentation

## 2017-05-15 DIAGNOSIS — J439 Emphysema, unspecified: Secondary | ICD-10-CM | POA: Insufficient documentation

## 2017-05-15 DIAGNOSIS — Z923 Personal history of irradiation: Secondary | ICD-10-CM | POA: Insufficient documentation

## 2017-05-15 DIAGNOSIS — I2510 Atherosclerotic heart disease of native coronary artery without angina pectoris: Secondary | ICD-10-CM | POA: Insufficient documentation

## 2017-05-15 DIAGNOSIS — I700 Atherosclerosis of aorta: Secondary | ICD-10-CM | POA: Diagnosis not present

## 2017-05-15 DIAGNOSIS — F1010 Alcohol abuse, uncomplicated: Secondary | ICD-10-CM | POA: Insufficient documentation

## 2017-05-15 DIAGNOSIS — E039 Hypothyroidism, unspecified: Secondary | ICD-10-CM

## 2017-05-15 DIAGNOSIS — Z85810 Personal history of malignant neoplasm of tongue: Secondary | ICD-10-CM | POA: Insufficient documentation

## 2017-05-15 DIAGNOSIS — Z79899 Other long term (current) drug therapy: Secondary | ICD-10-CM | POA: Diagnosis not present

## 2017-05-15 DIAGNOSIS — C01 Malignant neoplasm of base of tongue: Secondary | ICD-10-CM | POA: Insufficient documentation

## 2017-05-15 DIAGNOSIS — R918 Other nonspecific abnormal finding of lung field: Secondary | ICD-10-CM | POA: Diagnosis not present

## 2017-05-15 LAB — COMPREHENSIVE METABOLIC PANEL
ALK PHOS: 83 U/L (ref 40–150)
ALT: 21 U/L (ref 0–55)
AST: 23 U/L (ref 5–34)
Albumin: 4.4 g/dL (ref 3.5–5.0)
Anion gap: 6 (ref 3–11)
BILIRUBIN TOTAL: 1.1 mg/dL (ref 0.2–1.2)
BUN: 22 mg/dL (ref 7–26)
CALCIUM: 9.9 mg/dL (ref 8.4–10.4)
CO2: 25 mmol/L (ref 22–29)
CREATININE: 1.24 mg/dL (ref 0.70–1.30)
Chloride: 109 mmol/L (ref 98–109)
GFR calc Af Amer: >60 mL/min (ref >60)
GFR, EST NON AFRICAN AMERICAN: 55 mL/min — AB (ref >60)
Glucose, Bld: 91 mg/dL (ref 70–140)
POTASSIUM: 4.5 mmol/L (ref 3.5–5.1)
Sodium: 140 mmol/L (ref 136–145)
TOTAL PROTEIN: 7.6 g/dL (ref 6.4–8.3)

## 2017-05-15 LAB — CBC WITH DIFFERENTIAL/PLATELET
Basophils Absolute: 0 10*3/uL (ref 0.0–0.1)
Basophils Relative: 1 %
EOS PCT: 1 %
Eosinophils Absolute: 0.1 10*3/uL (ref 0.0–0.5)
HCT: 47.1 % (ref 38.4–49.9)
Hemoglobin: 15.8 g/dL (ref 13.0–17.1)
Lymphocytes Relative: 12 %
Lymphs Abs: 0.7 10*3/uL — ABNORMAL LOW (ref 0.9–3.3)
MCH: 30.6 pg (ref 27.2–33.4)
MCHC: 33.5 g/dL (ref 32.0–36.0)
MCV: 91.2 fL (ref 79.3–98.0)
Monocytes Absolute: 0.6 10*3/uL (ref 0.1–0.9)
Monocytes Relative: 11 %
NEUTROS ABS: 4.6 10*3/uL (ref 1.5–6.5)
Neutrophils Relative %: 75 %
Platelets: 194 10*3/uL (ref 140–400)
RBC: 5.16 MIL/uL (ref 4.20–5.82)
RDW: 13 % (ref 11.0–14.6)
WBC: 6.1 10*3/uL (ref 4.0–10.3)

## 2017-05-15 LAB — TSH: TSH: 6.559 u[IU]/mL — ABNORMAL HIGH (ref 0.320–4.118)

## 2017-05-15 MED ORDER — IOHEXOL 300 MG/ML  SOLN
75.0000 mL | Freq: Once | INTRAMUSCULAR | Status: AC | PRN
  Administered 2017-05-15: 75 mL via INTRAVENOUS

## 2017-05-17 ENCOUNTER — Telehealth: Payer: Self-pay | Admitting: Hematology and Oncology

## 2017-05-17 ENCOUNTER — Inpatient Hospital Stay: Payer: Medicare Other | Admitting: Hematology and Oncology

## 2017-05-17 ENCOUNTER — Other Ambulatory Visit: Payer: Self-pay

## 2017-05-17 NOTE — Telephone Encounter (Signed)
Appointment rescheduled per patient request 5/17

## 2017-05-20 ENCOUNTER — Encounter: Payer: Self-pay | Admitting: Hematology and Oncology

## 2017-05-20 ENCOUNTER — Inpatient Hospital Stay (HOSPITAL_BASED_OUTPATIENT_CLINIC_OR_DEPARTMENT_OTHER): Payer: Medicare Other | Admitting: Hematology and Oncology

## 2017-05-20 ENCOUNTER — Telehealth: Payer: Self-pay | Admitting: Hematology and Oncology

## 2017-05-20 DIAGNOSIS — Z923 Personal history of irradiation: Secondary | ICD-10-CM | POA: Diagnosis not present

## 2017-05-20 DIAGNOSIS — Z79899 Other long term (current) drug therapy: Secondary | ICD-10-CM

## 2017-05-20 DIAGNOSIS — Z85810 Personal history of malignant neoplasm of tongue: Secondary | ICD-10-CM | POA: Diagnosis not present

## 2017-05-20 DIAGNOSIS — R911 Solitary pulmonary nodule: Secondary | ICD-10-CM

## 2017-05-20 DIAGNOSIS — Z9221 Personal history of antineoplastic chemotherapy: Secondary | ICD-10-CM

## 2017-05-20 DIAGNOSIS — E039 Hypothyroidism, unspecified: Secondary | ICD-10-CM

## 2017-05-20 DIAGNOSIS — F1010 Alcohol abuse, uncomplicated: Secondary | ICD-10-CM

## 2017-05-20 DIAGNOSIS — C01 Malignant neoplasm of base of tongue: Secondary | ICD-10-CM

## 2017-05-20 NOTE — Assessment & Plan Note (Signed)
He continues to drink regularly We have discussed many times about risk factors for cancer recurrence but the patient did not seem to have remembered our past conversations I recommend he stops drinking altogether

## 2017-05-20 NOTE — Assessment & Plan Note (Signed)
The lung nodule is stable, likely benign He does not need repeat imaging study in the future

## 2017-05-20 NOTE — Assessment & Plan Note (Signed)
Recent CT imaging of the chest showrf no evidence of cancer recurrence He has not been to see ENT physician for some time despite numerous discussions in the past The patient continues to drink on a regular basis We discussed risk factors for cancer recurrence Clinically, he does not have signs of cancer recurrence today I will see him back once a year for follow-up

## 2017-05-20 NOTE — Telephone Encounter (Signed)
Patient decline avs and calendar °

## 2017-05-20 NOTE — Progress Notes (Signed)
[facility] OFFICE PROGRESS NOTE  Patient Care Team: Seward Carol, MD as PCP - General (Internal Medicine) Eppie Gibson, MD as Attending Physician (Radiation Oncology) Heath Lark, MD as Consulting Physician (Hematology and Oncology) Karie Mainland, RD as Dietitian (Nutrition)  ASSESSMENT & PLAN:  Malignant neoplasm of base of tongue Recent CT imaging of the chest showrf no evidence of cancer recurrence He has not been to see ENT physician for some time despite numerous discussions in the past The patient continues to drink on a regular basis We discussed risk factors for cancer recurrence Clinically, he does not have signs of cancer recurrence today I will see him back once a year for follow-up  Alcohol abuse He continues to drink regularly We have discussed many times about risk factors for cancer recurrence but the patient did not seem to have remembered our past conversations I recommend he stops drinking altogether  Acquired hypothyroidism Recent TSH is elevated The patient complained of fatigue I recommend return appointment to see his primary care doctor for medication adjustment  Solitary pulmonary nodule on lung CT The lung nodule is stable, likely benign He does not need repeat imaging study in the future   No orders of the defined types were placed in this encounter.   INTERVAL HISTORY: Please see below for problem oriented charting. He returns with his caregiver for further follow-up The patient has recently been moved to an assisted living facility due to memory impairment He continues to drink on a regular basis He denies fall He stated he is taking his thyroid medication as prescribed Denies recent swallowing difficulties No new dental issue No tongue masses or abnormal lumps in his neck He has not seen ENT physician for some time  SUMMARY OF ONCOLOGIC HISTORY:   Malignant neoplasm of base of tongue ([name])   03/03/2014 Imaging    Ct  neck elsewhere showed base of tongue mass crossing midline, bilateral LN enlargement, great >4 cm      03/05/2014 Procedure    He has FNA biopsy of LN      03/05/2014 Pathology Results    NZA [id number] biopsy confirmed squmaous cell carcinoma HPV positive      03/18/2014 Imaging    PET/Ct scan showed tongue mass, bilateral LN      04/02/2014 Procedure    He has placement of feeding tube and port      04/06/2014 - 05/25/2014 Radiation Therapy    Rec'd RT to base of tongue and bilateral neck:  70 Gy in 35 fractions to gross disease, 63 Gy in 35 fractions to high risk nodal echelons, and 56 Gy in 35 fractions to intermediate risk nodal echelons.       04/07/2014 - 05/05/2014 Chemotherapy    He received 2 doses of high dose cisplatin. He could not received a third dose due to profound side effects.      04/28/2014 Adverse Reaction    Cycle 2 chemotherapy is delayed due to neutropenia      05/25/2014 - 06/03/2014 Hospital Admission    He was admitted to the hospital for management of acute delirium.      07/19/2014 Imaging    PET/CT, restaging:  Resolution of tongue base lesion; significant decrease inbilateral cervical lymphadenopathy.  No definite evidence of metastatic disease.      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      12/21/2014 Imaging    Repeat Ct scan showed no evidence of cancer      02/02/2015 Procedure    Port-a-cath and PEG removed.      05/23/2015 Imaging    1. Lung-RADS Category 2, benign appearance or behavior. Continue annual screening with low-dose chest CT without contrast in 12 months. 2. Three-vessel coronary artery calcification      06/07/2016 Imaging    Scattered right lung nodules measuring up to 5 mm, unchanged, likely benign      05/15/2017 Imaging     1. Ongoing stability of bilateral pulmonary nodules, primarily felt to represent subpleural lymph nodes. This ongoing stability is most consistent with a benign etiology. 2. No acute process or evidence of metastatic disease in the chest. 3. Aortic atherosclerosis (ICD10-I70.0), coronary artery atherosclerosis and emphysema (ICD10-J43.9). 4. Aortic valvular calcifications. Consider echocardiography to evaluate for valvular dysfunction.       REVIEW OF SYSTEMS:   Constitutional: Denies fevers, chills or abnormal weight loss Eyes: Denies blurriness of vision Ears, nose, mouth, throat, and face: Denies mucositis or sore throat Respiratory: Denies cough, dyspnea or wheezes Cardiovascular: Denies palpitation, chest discomfort or lower extremity swelling Gastrointestinal:  Denies nausea, heartburn or change in bowel habits Skin: Denies abnormal skin rashes Lymphatics: Denies new lymphadenopathy or easy bruising Neurological:Denies numbness, tingling or new weaknesses Behavioral/Psych: Mood is stable, no new changes  All other systems were reviewed with the patient and are negative.  I have reviewed the past medical history, past surgical history, social history and family history with the patient and they are unchanged from previous note.  ALLERGIES:  has No Known Allergies.  MEDICATIONS:  Current Outpatient Medications  Medication Sig Dispense Refill  . amphetamine-dextroamphetamine (ADDERALL) 20 MG tablet Take 20 mg by mouth daily. Reported on 12/22/2014    . atorvastatin (LIPITOR) 20 MG tablet Place 1 tablet (20 mg total) into feeding tube daily at 6 PM. (Patient taking differently: Take 20 mg by mouth daily at 6 PM. ) 30 tablet 0  . clopidogrel (PLAVIX) 75 MG tablet Place 1 tablet (75 mg total) into feeding tube daily. (Patient taking differently: Take 75 mg by mouth daily. ) 30 tablet 0  . donepezil (ARICEPT) 10 MG tablet 1 tablet daily. 90 tablet 3  . levothyroxine (SYNTHROID,  LEVOTHROID) 75 MCG tablet Place 1 tablet (75 mcg total) into feeding tube daily before breakfast. (Patient taking differently: Take 75 mcg by mouth daily before breakfast. ) 30 tablet 0  . sodium fluoride (PREVIDENT 5000 PLUS) 1.1 % CREA dental cream Apply to tooth brush. Brush teeth for 2 minutes. Spit out excess-DO NOT swallow. Repeat nightly. 1 Tube prn  . tamsulosin (FLOMAX) 0.4 MG CAPS capsule Take 0.4 mg by mouth daily.  3  . thiamine 100 MG tablet Take 100 mg by mouth daily.      No current facility-administered medications for this visit.     PHYSICAL EXAMINATION: ECOG PERFORMANCE STATUS: 1 - Symptomatic but completely ambulatory  Vitals:   05/20/17 1212  BP: (!) 161/88  Pulse: 74  Resp: 18  Temp: 97.8 F (36.6 C)  SpO2: 96%   Filed Weights   05/20/17 1212  Weight: 178 lb 6.4 oz (80.9 kg)    GENERAL:alert, no distress and comfortable SKIN: skin color, texture, turgor are normal, no rashes or significant lesions EYES: normal, Conjunctiva are pink and  non-injected, sclera clear OROPHARYNX:no exudate, no erythema and lips, buccal mucosa, and tongue normal  NECK: supple, thyroid normal size, non-tender, without nodularity LYMPH:  no palpable lymphadenopathy in the cervical, axillary or inguinal LUNGS: clear to auscultation and percussion with normal breathing effort HEART: regular rate & rhythm and no murmurs and no lower extremity edema ABDOMEN:abdomen soft, non-tender and normal bowel sounds Musculoskeletal:no cyanosis of digits and no clubbing  NEURO: alert & oriented x 3 with fluent speech, no focal motor/sensory deficits  LABORATORY DATA:  I have reviewed the data as listed    Component Value Date/Time   NA 140 05/15/2017 1338   NA 141 05/18/2016 1413   K 4.5 05/15/2017 1338   K 4.1 05/18/2016 1413   CL 109 05/15/2017 1338   CO2 25 05/15/2017 1338   CO2 24 05/18/2016 1413   GLUCOSE 91 05/15/2017 1338   GLUCOSE 105 05/18/2016 1413   BUN 22 05/15/2017 1338    BUN 19.9 05/18/2016 1413   CREATININE 1.24 05/15/2017 1338   CREATININE 1.0 05/18/2016 1413   CALCIUM 9.9 05/15/2017 1338   CALCIUM 9.9 05/18/2016 1413   PROT 7.6 05/15/2017 1338   PROT 6.9 05/18/2016 1413   ALBUMIN 4.4 05/15/2017 1338   ALBUMIN 4.0 05/18/2016 1413   AST 23 05/15/2017 1338   AST 23 05/18/2016 1413   ALT 21 05/15/2017 1338   ALT 23 05/18/2016 1413   ALKPHOS 83 05/15/2017 1338   ALKPHOS 97 05/18/2016 1413   BILITOT 1.1 05/15/2017 1338   BILITOT 1.26 (H) 05/18/2016 1413   GFRNONAA 55 (L) 05/15/2017 1338   GFRAA >60 05/15/2017 1338    No results found for: SPEP, UPEP  Lab Results  Component Value Date   WBC 6.1 05/15/2017   NEUTROABS 4.6 05/15/2017   HGB 15.8 05/15/2017   HCT 47.1 05/15/2017   MCV 91.2 05/15/2017   PLT 194 05/15/2017      Chemistry      Component Value Date/Time   NA 140 05/15/2017 1338   NA 141 05/18/2016 1413   K 4.5 05/15/2017 1338   K 4.1 05/18/2016 1413   CL 109 05/15/2017 1338   CO2 25 05/15/2017 1338   CO2 24 05/18/2016 1413   BUN 22 05/15/2017 1338   BUN 19.9 05/18/2016 1413   CREATININE 1.24 05/15/2017 1338   CREATININE 1.0 05/18/2016 1413      Component Value Date/Time   CALCIUM 9.9 05/15/2017 1338   CALCIUM 9.9 05/18/2016 1413   ALKPHOS 83 05/15/2017 1338   ALKPHOS 97 05/18/2016 1413   AST 23 05/15/2017 1338   AST 23 05/18/2016 1413   ALT 21 05/15/2017 1338   ALT 23 05/18/2016 1413   BILITOT 1.1 05/15/2017 1338   BILITOT 1.26 (H) 05/18/2016 1413       RADIOGRAPHIC STUDIES: I have personally reviewed the radiological images as listed and agreed with the findings in the report. Ct Chest W Contrast  Result Date: 05/15/2017 CLINICAL DATA:  Lung cancer diagnosed in 2016 with radiation and chemotherapy completed. Follow-up of pulmonary nodule. EXAM: CT CHEST WITH CONTRAST TECHNIQUE: Multidetector CT imaging of the chest was performed during intravenous contrast administration. CONTRAST:  24mL OMNIPAQUE IOHEXOL 300  MG/ML  SOLN COMPARISON:  06/07/2016 FINDINGS: Cardiovascular: Aortic and branch vessel atherosclerosis. Normal heart size, without pericardial effusion. Multivessel coronary artery atherosclerosis. No central pulmonary embolism, on this non-dedicated study. Aortic valve calcifications. Mediastinum/Nodes: No supraclavicular adenopathy. Small middle mediastinal nodes are similar. No mediastinal or hilar adenopathy. Lungs/Pleura: No  pleural fluid.  Moderate centrilobular emphysema. Scattered primarily subpleural pulmonary nodules are similar in size and distribution on the prior. Example lateral right lower lobe along the right major fissure at 6 mm on image 85/5. No new or enlarging pulmonary nodules identified. Suspect radiation fibrosis within the anterior lung apices bilaterally. Upper Abdomen: Normal imaged portions of the liver, spleen, pancreas, adrenal glands, kidneys. The stomach is intimately associated with the anterior abdominal wall, with overlying subcutaneous soft tissue thickening. Likely the site of prior gastrostomy tube. Example image 176/2. Musculoskeletal: Lower cervical spondylosis. IMPRESSION: 1. Ongoing stability of bilateral pulmonary nodules, primarily felt to represent subpleural lymph nodes. This ongoing stability is most consistent with a benign etiology. 2.  No acute process or evidence of metastatic disease in the chest. 3. Aortic atherosclerosis (ICD10-I70.0), coronary artery atherosclerosis and emphysema (ICD10-J43.9). 4. Aortic valvular calcifications. Consider echocardiography to evaluate for valvular dysfunction. Electronically Signed   By: Abigail Miyamoto M.D.   On: 05/15/2017 17:13    All questions were answered. The patient knows to call the clinic with any problems, questions or concerns. No barriers to learning was detected.  I spent 15 minutes counseling the patient face to face. The total time spent in the appointment was 20 minutes and more than 50% was on counseling and  review of test results  Heath Lark, MD 05/20/2017 3:44 PM

## 2017-05-20 NOTE — Assessment & Plan Note (Signed)
Recent TSH is elevated The patient complained of fatigue I recommend return appointment to see his primary care doctor for medication adjustment

## 2017-06-04 DIAGNOSIS — H6123 Impacted cerumen, bilateral: Secondary | ICD-10-CM | POA: Insufficient documentation

## 2017-06-04 DIAGNOSIS — Z7289 Other problems related to lifestyle: Secondary | ICD-10-CM | POA: Diagnosis not present

## 2017-06-04 DIAGNOSIS — Z85810 Personal history of malignant neoplasm of tongue: Secondary | ICD-10-CM | POA: Diagnosis not present

## 2017-06-04 DIAGNOSIS — E039 Hypothyroidism, unspecified: Secondary | ICD-10-CM | POA: Diagnosis not present

## 2017-06-04 DIAGNOSIS — H6121 Impacted cerumen, right ear: Secondary | ICD-10-CM | POA: Diagnosis not present

## 2017-06-04 DIAGNOSIS — L578 Other skin changes due to chronic exposure to nonionizing radiation: Secondary | ICD-10-CM | POA: Diagnosis not present

## 2017-06-04 DIAGNOSIS — F17290 Nicotine dependence, other tobacco product, uncomplicated: Secondary | ICD-10-CM | POA: Diagnosis not present

## 2017-07-16 DIAGNOSIS — E039 Hypothyroidism, unspecified: Secondary | ICD-10-CM | POA: Diagnosis not present

## 2017-08-13 DIAGNOSIS — E039 Hypothyroidism, unspecified: Secondary | ICD-10-CM | POA: Diagnosis not present

## 2017-09-30 DIAGNOSIS — E039 Hypothyroidism, unspecified: Secondary | ICD-10-CM | POA: Diagnosis not present

## 2017-10-24 DIAGNOSIS — Z23 Encounter for immunization: Secondary | ICD-10-CM | POA: Diagnosis not present

## 2017-10-29 DIAGNOSIS — R5383 Other fatigue: Secondary | ICD-10-CM | POA: Diagnosis not present

## 2017-10-29 DIAGNOSIS — C760 Malignant neoplasm of head, face and neck: Secondary | ICD-10-CM | POA: Diagnosis not present

## 2017-10-29 DIAGNOSIS — E7800 Pure hypercholesterolemia, unspecified: Secondary | ICD-10-CM | POA: Diagnosis not present

## 2017-10-29 DIAGNOSIS — E039 Hypothyroidism, unspecified: Secondary | ICD-10-CM | POA: Diagnosis not present

## 2017-10-29 DIAGNOSIS — F908 Attention-deficit hyperactivity disorder, other type: Secondary | ICD-10-CM | POA: Diagnosis not present

## 2017-10-29 DIAGNOSIS — G459 Transient cerebral ischemic attack, unspecified: Secondary | ICD-10-CM | POA: Diagnosis not present

## 2017-10-29 DIAGNOSIS — R413 Other amnesia: Secondary | ICD-10-CM | POA: Diagnosis not present

## 2017-12-09 ENCOUNTER — Ambulatory Visit (INDEPENDENT_AMBULATORY_CARE_PROVIDER_SITE_OTHER): Payer: Medicare Other | Admitting: Neurology

## 2017-12-09 ENCOUNTER — Encounter: Payer: Self-pay | Admitting: Neurology

## 2017-12-09 VITALS — BP 140/100 | HR 76 | Ht 74.0 in | Wt 181.5 lb

## 2017-12-09 DIAGNOSIS — F0390 Unspecified dementia without behavioral disturbance: Secondary | ICD-10-CM | POA: Diagnosis not present

## 2017-12-09 DIAGNOSIS — G621 Alcoholic polyneuropathy: Secondary | ICD-10-CM | POA: Diagnosis not present

## 2017-12-09 DIAGNOSIS — H9313 Tinnitus, bilateral: Secondary | ICD-10-CM | POA: Diagnosis not present

## 2017-12-09 DIAGNOSIS — F03A0 Unspecified dementia, mild, without behavioral disturbance, psychotic disturbance, mood disturbance, and anxiety: Secondary | ICD-10-CM

## 2017-12-09 NOTE — Progress Notes (Signed)
Follow-up Visit   Date: 12/09/17    Dylan Moreno MRN: [id number] DOB: 11/25/1940   Interim History: Dylan Moreno is a 77 y.o. right-handed Caucasian male with history of hyperlipidemia, tobacco use, ADD, TIA (12/29/2012), squamous cell carcinoma s/p chemo/radiation (Spring 2016) returning to the clinic for complaints of memory impairment.  The patient was accompanied to the clinic by wife.  History of present illness: He presented to the Emergency Department on 12/29/2012 with acute onset of left facial droop, lasting one hour. MRI/A of the brain did not show acute stroke. There was a note of ventriculomegaly suggestive of NPH. US carotids, echo, and holter was normal. He was previously taking lipitor 10mg  and was taking aspirin 81mg  daily. He was discharged on aspirin 325mg .  However, upon follow-up visit with me, I switched him to plavix 75mg  as he was already taking aspirin.  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 Neuropsychological testing with Dr. Valentina Shaggy showed neurocognitive disorder, unspecified, and ADHD.  He was cleared to drive by occupational therapy.   In 2018, he started having greater difficulty using his phone and remote control, as well as increased forgetfulness with events/conversations.  He tends to be irritable at night, which he wife attributes to his drinking.  Despite my recommendation to abstain from alcohol, he continues to consume 2-3 drinks nightly.  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  He has not been compliant with his medications for ADHD.   His wife states that he continues to drink 3-4 oz of gin nightly.  He disagrees with his wife about his alcohol consumption and gets easily irritated  when this is mentioned.    He moved in independent living at [facility] in December 2018.    UPDATE 12/09/2017:  He is here for follow-up visit.  He continues to live alone at [facility] independent living.  His wife states that he can be more forgetful and sometimes forgets to takes his medications.  No behavior changes.  He continues to engage socially with residents there.  He continues to drive and prepare his own meals.  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  He suffered one mechanical fall after loosing balance in the grass, but did not have any injuries.  He continues to drink alcohol nightly.   He has been more fatigued lately and Dr. Delfina Redwood has been adjusting his thyroid medication.  He also complaints of bilateral ringing in the ears.  No dizziness or vertigo.   Medications:  Current Outpatient Medications on File Prior to Visit  Medication Sig Dispense Refill  . amphetamine-dextroamphetamine (ADDERALL) 20 MG tablet Take 20 mg by mouth daily. Reported on 12/22/2014    . atorvastatin (LIPITOR) 20 MG tablet Place 1 tablet (20 mg total) into feeding tube daily at 6 PM. (Patient taking differently: Take 20 mg by mouth daily at 6 PM. ) 30 tablet 0  . clopidogrel (PLAVIX) 75 MG tablet Place 1 tablet (75 mg total) into feeding tube daily. (Patient taking differently: Take 75 mg by mouth daily. ) 30 tablet 0  . donepezil (ARICEPT) 10 MG tablet 1 tablet daily. 90 tablet 3  . levothyroxine (SYNTHROID, LEVOTHROID) 112 MCG tablet Take 112 mcg by mouth daily.  3  .  sodium fluoride (PREVIDENT 5000 PLUS) 1.1 % CREA dental cream Apply to tooth brush. Brush teeth for 2 minutes. Spit out excess-DO NOT swallow. Repeat nightly. 1 Tube prn  . tamsulosin (FLOMAX) 0.4 MG CAPS capsule Take 0.4 mg by mouth daily.  3  . thiamine 100 MG tablet Take 100 mg by mouth daily.      No current facility-administered medications on file prior to visit.     Allergies: No Known Allergies   Review of Systems:  CONSTITUTIONAL:  No fevers, chills, night sweats, or weight loss.   EYES: No visual changes or eye pain ENT: No hearing changes.  No history of nose bleeds.  +ringing in the ears RESPIRATORY: No cough, wheezing and shortness of breath.   CARDIOVASCULAR: Negative for chest pain, and palpitations.   GI: Negative for abdominal discomfort, blood in stools or black stools.  No recent change in bowel habits.   GU:  No history of incontinence.   MUSCLOSKELETAL: No history of joint pain or swelling.  No myalgias.   SKIN: Negative for lesions, rash, and itching.   ENDOCRINE: Negative for cold or heat intolerance, polydipsia or goiter.   PSYCH:  No depression or anxiety symptoms.   NEURO: As Above.   Vital Signs:  BP (!) 140/100   Pulse 76   Ht 6\' 2"  (1.88 m)   Wt 181 lb 8 oz (82.3 kg)   SpO2 97%   BMI 23.30 kg/m   General Medical Exam:   General:  Well appearing, comfortable  Eyes/ENT: see cranial nerve examination.   Neck: No masses appreciated.  Full range of motion without tenderness.  No carotid bruits. Respiratory:  Clear to auscultation, good air entry bilaterally.   Cardiac:  Regular rate and rhythm, no murmur.   Ext:  No edema  Neurological Exam: MENTAL STATUS:  Alert, awake, and easily engages in conversation. Well groomed and dressed. Oriented x5. Speech is not dysarthric, but there is intermittent stuttering of speech (old)  CRANIAL NERVES:  Pupils round and reactive to light.  Normal conjugate, extra-ocular eye movements in all directions of gaze.  No ptosis. Face is symmetric. Palate elevates symmetrically.  Tongue is midline.  MOTOR:  Motor strength is 5/5 in all extremities, except distally with toe extension 4/5.  REFLEXES: Reflexes are 2+/4 in the arms, 1+/4 patella jerks, and absent in the ankles.  SENSORY:  Vibration is mildly reduced at the ankles.  Rhomberg sign is positive.   COORDINATION/GAIT:  Gait appears stable, unassisted.  Data: US carotids 12/30/2012: Findings  suggest 1-39% internal carotid artery stenosis bilaterally. Vertebral arteries are patent with antegrade flow.   MRI/A brain 12/29/2012:  1. Ventricular prominence is disproportionate to the degree of atrophy, suggesting normal pressure hydrocephalus.  2. No other acute intracranial abnormality. Specifically, there is no evidence for acute or subacute infarct.  3. Mild distal small vessel disease is evident on the MRA without significant proximal stenosis, aneurysm, or branch vessel occlusion.   48-hr holter:  NSR, rare PVCs  CT head 06/04/2014: Ventricular prominence similar to prior MR suggesting normal pressure hydrocephalus. No intracranial hemorrhage. No CT evidence of large acute infarct. No intracranial mass or abnormal enhancement.  CTA head and neck 06/04/2014:   Calcified plaque with mild to slightly moderate narrowing cavernous segment internal carotid artery bilaterally. Mild narrowing and irregularity M1 segment left middle cerebral artery. Mild narrowing distal left vertebral artery.  MRI lumbar spine wo contrast 09/30/2014: 1. Minimal left foraminal narrowing at L4-5 and L5-S1  secondary to leftward disc protrusions and facet hypertrophy. 2. Leftward disc protrusion at L3-4 without significant stenosis.  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  Lab Results  Component Value Date   TSH 6.559 (H) 05/15/2017   Lab Results  Component Value Date   VITAMINB12 407 03/06/2017     IMPRESSION: 1.  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  Overall there is mild progression, he needs assistance with managing finances and medications which his wife does. Housecleaning is provided by independent living facility.   I will keep him on donepezil 10mg  daily.   He was encouraged to use pillbox with alarm to optimize medication compliance  2.  Alcoholic peripheral neuropathy causing sensory ataxia, stable.  Counseled on alcohol cessation.  Always use a cane when on uneven ground.  3.  Tinnitus - follow-up with ENT  4. .  Ventricular prominence on CT head which as been stable since 2014.  No clinical signs of NPH - although he had dementia, gait does not appear magnetic and he does not have incontinence.  Gait has been stable.   5. 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  No recurrent neurological events.  Continue plavix 75mg  daily, statin therapy, and BP medication for secondary prevention.  Return to clinic in 1 year  Thank you for allowing me to participate in patient's care.  If I can answer any additional questions, I would be pleased to do so.    Sincerely,    Saliou Barnier K. Posey Pronto, DO

## 2017-12-09 NOTE — Patient Instructions (Signed)
Start using a pillbox with alarm to optimize medication compliance  Always use a cane whenever walking on uneven ground  Return to clinic in 1 year

## 2018-02-05 ENCOUNTER — Other Ambulatory Visit: Payer: Self-pay | Admitting: Neurology

## 2018-04-16 DIAGNOSIS — R413 Other amnesia: Secondary | ICD-10-CM | POA: Diagnosis not present

## 2018-04-16 DIAGNOSIS — W19XXXA Unspecified fall, initial encounter: Secondary | ICD-10-CM | POA: Diagnosis not present

## 2018-04-16 DIAGNOSIS — I10 Essential (primary) hypertension: Secondary | ICD-10-CM | POA: Diagnosis not present

## 2018-04-16 DIAGNOSIS — G459 Transient cerebral ischemic attack, unspecified: Secondary | ICD-10-CM | POA: Diagnosis not present

## 2018-04-16 DIAGNOSIS — R2681 Unsteadiness on feet: Secondary | ICD-10-CM | POA: Diagnosis not present

## 2018-04-16 DIAGNOSIS — F901 Attention-deficit hyperactivity disorder, predominantly hyperactive type: Secondary | ICD-10-CM | POA: Diagnosis not present

## 2018-04-16 DIAGNOSIS — F419 Anxiety disorder, unspecified: Secondary | ICD-10-CM | POA: Diagnosis not present

## 2018-04-17 ENCOUNTER — Ambulatory Visit
Admission: RE | Admit: 2018-04-17 | Discharge: 2018-04-17 | Disposition: A | Discharge status: Home / Self Care | Payer: Medicare Other | Source: Ambulatory Visit | Attending: Internal Medicine | Admitting: Internal Medicine

## 2018-04-17 ENCOUNTER — Other Ambulatory Visit: Payer: Self-pay

## 2018-04-17 ENCOUNTER — Other Ambulatory Visit: Payer: Self-pay | Admitting: Internal Medicine

## 2018-04-17 DIAGNOSIS — C760 Malignant neoplasm of head, face and neck: Secondary | ICD-10-CM

## 2018-04-17 DIAGNOSIS — R42 Dizziness and giddiness: Secondary | ICD-10-CM | POA: Diagnosis not present

## 2018-05-07 DIAGNOSIS — R2681 Unsteadiness on feet: Secondary | ICD-10-CM | POA: Diagnosis not present

## 2018-05-07 DIAGNOSIS — R278 Other lack of coordination: Secondary | ICD-10-CM | POA: Diagnosis not present

## 2018-05-07 DIAGNOSIS — M6281 Muscle weakness (generalized): Secondary | ICD-10-CM | POA: Diagnosis not present

## 2018-05-07 DIAGNOSIS — R262 Difficulty in walking, not elsewhere classified: Secondary | ICD-10-CM | POA: Diagnosis not present

## 2018-05-08 DIAGNOSIS — M6281 Muscle weakness (generalized): Secondary | ICD-10-CM | POA: Diagnosis not present

## 2018-05-08 DIAGNOSIS — R278 Other lack of coordination: Secondary | ICD-10-CM | POA: Diagnosis not present

## 2018-05-08 DIAGNOSIS — R2681 Unsteadiness on feet: Secondary | ICD-10-CM | POA: Diagnosis not present

## 2018-05-08 DIAGNOSIS — R262 Difficulty in walking, not elsewhere classified: Secondary | ICD-10-CM | POA: Diagnosis not present

## 2018-05-09 DIAGNOSIS — R278 Other lack of coordination: Secondary | ICD-10-CM | POA: Diagnosis not present

## 2018-05-09 DIAGNOSIS — M6281 Muscle weakness (generalized): Secondary | ICD-10-CM | POA: Diagnosis not present

## 2018-05-09 DIAGNOSIS — R262 Difficulty in walking, not elsewhere classified: Secondary | ICD-10-CM | POA: Diagnosis not present

## 2018-05-09 DIAGNOSIS — R2681 Unsteadiness on feet: Secondary | ICD-10-CM | POA: Diagnosis not present

## 2018-05-12 DIAGNOSIS — R2681 Unsteadiness on feet: Secondary | ICD-10-CM | POA: Diagnosis not present

## 2018-05-12 DIAGNOSIS — M6281 Muscle weakness (generalized): Secondary | ICD-10-CM | POA: Diagnosis not present

## 2018-05-12 DIAGNOSIS — R278 Other lack of coordination: Secondary | ICD-10-CM | POA: Diagnosis not present

## 2018-05-12 DIAGNOSIS — R262 Difficulty in walking, not elsewhere classified: Secondary | ICD-10-CM | POA: Diagnosis not present

## 2018-05-14 DIAGNOSIS — R2681 Unsteadiness on feet: Secondary | ICD-10-CM | POA: Diagnosis not present

## 2018-05-14 DIAGNOSIS — R278 Other lack of coordination: Secondary | ICD-10-CM | POA: Diagnosis not present

## 2018-05-14 DIAGNOSIS — R262 Difficulty in walking, not elsewhere classified: Secondary | ICD-10-CM | POA: Diagnosis not present

## 2018-05-14 DIAGNOSIS — M6281 Muscle weakness (generalized): Secondary | ICD-10-CM | POA: Diagnosis not present

## 2018-05-16 DIAGNOSIS — M6281 Muscle weakness (generalized): Secondary | ICD-10-CM | POA: Diagnosis not present

## 2018-05-16 DIAGNOSIS — R2681 Unsteadiness on feet: Secondary | ICD-10-CM | POA: Diagnosis not present

## 2018-05-16 DIAGNOSIS — R278 Other lack of coordination: Secondary | ICD-10-CM | POA: Diagnosis not present

## 2018-05-16 DIAGNOSIS — R262 Difficulty in walking, not elsewhere classified: Secondary | ICD-10-CM | POA: Diagnosis not present

## 2018-05-19 DIAGNOSIS — M6281 Muscle weakness (generalized): Secondary | ICD-10-CM | POA: Diagnosis not present

## 2018-05-19 DIAGNOSIS — R2681 Unsteadiness on feet: Secondary | ICD-10-CM | POA: Diagnosis not present

## 2018-05-19 DIAGNOSIS — R278 Other lack of coordination: Secondary | ICD-10-CM | POA: Diagnosis not present

## 2018-05-19 DIAGNOSIS — R262 Difficulty in walking, not elsewhere classified: Secondary | ICD-10-CM | POA: Diagnosis not present

## 2018-05-21 DIAGNOSIS — R2681 Unsteadiness on feet: Secondary | ICD-10-CM | POA: Diagnosis not present

## 2018-05-21 DIAGNOSIS — R262 Difficulty in walking, not elsewhere classified: Secondary | ICD-10-CM | POA: Diagnosis not present

## 2018-05-21 DIAGNOSIS — M6281 Muscle weakness (generalized): Secondary | ICD-10-CM | POA: Diagnosis not present

## 2018-05-21 DIAGNOSIS — R278 Other lack of coordination: Secondary | ICD-10-CM | POA: Diagnosis not present

## 2018-05-22 ENCOUNTER — Inpatient Hospital Stay: Payer: Medicare Other | Attending: Hematology and Oncology | Admitting: Hematology and Oncology

## 2018-05-22 ENCOUNTER — Other Ambulatory Visit: Payer: Self-pay

## 2018-05-22 DIAGNOSIS — I2510 Atherosclerotic heart disease of native coronary artery without angina pectoris: Secondary | ICD-10-CM | POA: Diagnosis not present

## 2018-05-22 DIAGNOSIS — Z79899 Other long term (current) drug therapy: Secondary | ICD-10-CM | POA: Insufficient documentation

## 2018-05-22 DIAGNOSIS — Z9221 Personal history of antineoplastic chemotherapy: Secondary | ICD-10-CM | POA: Diagnosis not present

## 2018-05-22 DIAGNOSIS — C01 Malignant neoplasm of base of tongue: Secondary | ICD-10-CM | POA: Diagnosis present

## 2018-05-22 DIAGNOSIS — I700 Atherosclerosis of aorta: Secondary | ICD-10-CM | POA: Insufficient documentation

## 2018-05-22 DIAGNOSIS — Z923 Personal history of irradiation: Secondary | ICD-10-CM | POA: Insufficient documentation

## 2018-05-22 DIAGNOSIS — J439 Emphysema, unspecified: Secondary | ICD-10-CM | POA: Insufficient documentation

## 2018-05-22 DIAGNOSIS — G3184 Mild cognitive impairment, so stated: Secondary | ICD-10-CM | POA: Diagnosis not present

## 2018-05-22 DIAGNOSIS — E039 Hypothyroidism, unspecified: Secondary | ICD-10-CM | POA: Diagnosis not present

## 2018-05-22 DIAGNOSIS — F1010 Alcohol abuse, uncomplicated: Secondary | ICD-10-CM | POA: Diagnosis not present

## 2018-05-23 ENCOUNTER — Encounter: Payer: Self-pay | Admitting: Hematology and Oncology

## 2018-05-23 DIAGNOSIS — Z1389 Encounter for screening for other disorder: Secondary | ICD-10-CM | POA: Diagnosis not present

## 2018-05-23 DIAGNOSIS — M6281 Muscle weakness (generalized): Secondary | ICD-10-CM | POA: Diagnosis not present

## 2018-05-23 DIAGNOSIS — Z0000 Encounter for general adult medical examination without abnormal findings: Secondary | ICD-10-CM | POA: Diagnosis not present

## 2018-05-23 DIAGNOSIS — R2681 Unsteadiness on feet: Secondary | ICD-10-CM | POA: Diagnosis not present

## 2018-05-23 DIAGNOSIS — R278 Other lack of coordination: Secondary | ICD-10-CM | POA: Diagnosis not present

## 2018-05-23 DIAGNOSIS — R262 Difficulty in walking, not elsewhere classified: Secondary | ICD-10-CM | POA: Diagnosis not present

## 2018-05-23 NOTE — Progress Notes (Signed)
[facility] OFFICE PROGRESS NOTE  Patient Care Team: Seward Carol, MD as PCP - General (Internal Medicine) Eppie Gibson, MD as Attending Physician (Radiation Oncology) Heath Lark, MD as Consulting Physician (Hematology and Oncology) Karie Mainland, RD as Dietitian (Nutrition)  ASSESSMENT & PLAN:  Malignant neoplasm of base of tongue He has no signs of cancer recurrence I recommend follow-up with ENT in 6 months I will see him next year for further follow-up  Alcohol abuse He continues to have alcohol abuse I have discussed this with his wife extensively over the telephone I reminded the patient the importance of alcohol cessation  Acquired hypothyroidism I reminded the patient and his wife the importance of getting his TSH monitored by his primary care doctor  Mild cognitive impairment with memory loss The patient is diagnosed with dementia His chronic alcohol intake is unlikely going to help him in the long run He had recent fall and I told his wife to monitor his status in the independent living facility.   No orders of the defined types were placed in this encounter.   INTERVAL HISTORY: Please see below for problem oriented charting. He returns for further follow-up He had recent fall He remains forgetful He is currently in an independent living facility I collaborated the history with his wife The patient continues to drink on a regular basis in the facility He denies smoking No recent dysphagia or new lump in his neck No recent dental issue  SUMMARY OF ONCOLOGIC HISTORY:   Malignant neoplasm of base of tongue ([place])   03/03/2014 Imaging    Ct neck elsewhere showed base of tongue mass crossing midline, bilateral LN enlargement, great >4 cm    03/05/2014 Procedure    He has FNA biopsy of LN    03/05/2014 Pathology Results    NZA 16-471 biopsy confirmed squmaous cell carcinoma HPV positive    03/18/2014 Imaging    PET/Ct scan showed tongue mass,  bilateral LN    04/02/2014 Procedure    He has placement of feeding tube and port    04/06/2014 - 05/25/2014 Radiation Therapy    Rec'd RT to base of tongue and bilateral neck:  70 Gy in 35 fractions to gross disease, 63 Gy in 35 fractions to high risk nodal echelons, and 56 Gy in 35 fractions to intermediate risk nodal echelons.     04/07/2014 - 05/05/2014 Chemotherapy    He received 2 doses of high dose cisplatin. He could not received a third dose due to profound side effects.    04/28/2014 Adverse Reaction    Cycle 2 chemotherapy is delayed due to neutropenia    05/25/2014 - 06/03/2014 Hospital Admission    He was admitted to the hospital for management of acute delirium.    07/19/2014 Imaging    PET/CT, restaging:  Resolution of tongue base lesion; significant decrease inbilateral cervical lymphadenopathy.  No definite evidence of metastatic disease.    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    12/21/2014 Imaging    Repeat Ct scan showed no evidence of cancer    02/02/2015 Procedure    Port-a-cath and PEG removed.    05/23/2015 Imaging    1. Lung-RADS Category 2, benign appearance or behavior. Continue annual screening with low-dose chest CT without  contrast in 12 months. 2. Three-vessel coronary artery calcification    06/07/2016 Imaging    Scattered right lung nodules measuring up to 5 mm, unchanged, likely benign    05/15/2017 Imaging    1. Ongoing stability of bilateral pulmonary nodules, primarily felt to represent subpleural lymph nodes. This ongoing stability is most consistent with a benign etiology. 2. No acute process or evidence of metastatic disease in the chest. 3. Aortic atherosclerosis (ICD10-I70.0), coronary artery atherosclerosis and emphysema (ICD10-J43.9). 4. Aortic  valvular calcifications. Consider echocardiography to evaluate for valvular dysfunction.     REVIEW OF SYSTEMS:   Constitutional: Denies fevers, chills or abnormal weight loss Eyes: Denies blurriness of vision Ears, nose, mouth, throat, and face: Denies mucositis or sore throat Respiratory: Denies cough, dyspnea or wheezes Cardiovascular: Denies palpitation, chest discomfort or lower extremity swelling Gastrointestinal:  Denies nausea, heartburn or change in bowel habits Skin: Denies abnormal skin rashes Lymphatics: Denies new lymphadenopathy or easy bruising Neurological:Denies numbness, tingling or new weaknesses Behavioral/Psych: Mood is stable, no new changes  All other systems were reviewed with the patient and are negative.  I have reviewed the past medical history, past surgical history, social history and family history with the patient and they are unchanged from previous note.  ALLERGIES:  has No Known Allergies.  MEDICATIONS:  Current Outpatient Medications  Medication Sig Dispense Refill  . amphetamine-dextroamphetamine (ADDERALL) 20 MG tablet Take 20 mg by mouth daily. Reported on 12/22/2014    . atorvastatin (LIPITOR) 20 MG tablet Place 1 tablet (20 mg total) into feeding tube daily at 6 PM. (Patient taking differently: Take 20 mg by mouth daily at 6 PM. ) 30 tablet 0  . clopidogrel (PLAVIX) 75 MG tablet Place 1 tablet (75 mg total) into feeding tube daily. (Patient taking differently: Take 75 mg by mouth daily. ) 30 tablet 0  . donepezil (ARICEPT) 10 MG tablet TAKE 1 TABLET BY MOUTH EVERY DAY 90 tablet 3  . levothyroxine (SYNTHROID, LEVOTHROID) 112 MCG tablet Take 112 mcg by mouth daily.  3  . sodium fluoride (PREVIDENT 5000 PLUS) 1.1 % CREA dental cream Apply to tooth brush. Brush teeth for 2 minutes. Spit out excess-DO NOT swallow. Repeat nightly. 1 Tube prn  . tamsulosin (FLOMAX) 0.4 MG CAPS capsule Take 0.4 mg by mouth daily.  3  . thiamine 100 MG tablet Take 100 mg  by mouth daily.      No current facility-administered medications for this visit.     PHYSICAL EXAMINATION: ECOG PERFORMANCE STATUS: 1 - Symptomatic but completely ambulatory  Vitals:   05/22/18 1255  BP: (!) 158/88  Pulse: 78  Resp: 18  Temp: 98.5 F (36.9 C)  SpO2: 98%   Filed Weights   05/22/18 1255  Weight: 173 lb 8 oz (78.7 kg)    GENERAL:alert, no distress and comfortable SKIN: skin color, texture, turgor are normal, no rashes or significant lesions.  Noted bruising from his fall EYES: normal, Conjunctiva are pink and non-injected, sclera clear OROPHARYNX:no exudate, no erythema and lips, buccal mucosa, and tongue normal  NECK: supple, thyroid normal size, non-tender, without nodularity LYMPH:  no palpable lymphadenopathy in the cervical, axillary or inguinal LUNGS: clear to auscultation and percussion with normal breathing effort HEART: regular rate & rhythm and no murmurs and no lower extremity edema ABDOMEN:abdomen soft, non-tender and normal bowel sounds Musculoskeletal:no cyanosis of digits and no clubbing  NEURO: alert & oriented x 3 with fluent speech, no focal motor/sensory deficits  LABORATORY DATA:  I have reviewed the data as listed    Component Value Date/Time   NA 140 05/15/2017 1338   NA 141 05/18/2016 1413   K 4.5 05/15/2017 1338   K 4.1 05/18/2016 1413   CL 109 05/15/2017 1338   CO2 25 05/15/2017 1338   CO2 24 05/18/2016 1413   GLUCOSE 91 05/15/2017 1338   GLUCOSE 105 05/18/2016 1413   BUN 22 05/15/2017 1338   BUN 19.9 05/18/2016 1413   CREATININE 1.24 05/15/2017 1338   CREATININE 1.0 05/18/2016 1413   CALCIUM 9.9 05/15/2017 1338   CALCIUM 9.9 05/18/2016 1413   PROT 7.6 05/15/2017 1338   PROT 6.9 05/18/2016 1413   ALBUMIN 4.4 05/15/2017 1338   ALBUMIN 4.0 05/18/2016 1413   AST 23 05/15/2017 1338   AST 23 05/18/2016 1413   ALT 21 05/15/2017 1338   ALT 23 05/18/2016 1413   ALKPHOS 83 05/15/2017 1338   ALKPHOS 97 05/18/2016 1413    BILITOT 1.1 05/15/2017 1338   BILITOT 1.26 (H) 05/18/2016 1413   GFRNONAA 55 (L) 05/15/2017 1338   GFRAA >60 05/15/2017 1338    No results found for: SPEP, UPEP  Lab Results  Component Value Date   WBC 6.1 05/15/2017   NEUTROABS 4.6 05/15/2017   HGB 15.8 05/15/2017   HCT 47.1 05/15/2017   MCV 91.2 05/15/2017   PLT 194 05/15/2017      Chemistry      Component Value Date/Time   NA 140 05/15/2017 1338   NA 141 05/18/2016 1413   K 4.5 05/15/2017 1338   K 4.1 05/18/2016 1413   CL 109 05/15/2017 1338   CO2 25 05/15/2017 1338   CO2 24 05/18/2016 1413   BUN 22 05/15/2017 1338   BUN 19.9 05/18/2016 1413   CREATININE 1.24 05/15/2017 1338   CREATININE 1.0 05/18/2016 1413      Component Value Date/Time   CALCIUM 9.9 05/15/2017 1338   CALCIUM 9.9 05/18/2016 1413   ALKPHOS 83 05/15/2017 1338   ALKPHOS 97 05/18/2016 1413   AST 23 05/15/2017 1338   AST 23 05/18/2016 1413   ALT 21 05/15/2017 1338   ALT 23 05/18/2016 1413   BILITOT 1.1 05/15/2017 1338   BILITOT 1.26 (H) 05/18/2016 1413      All questions were answered. The patient knows to call the clinic with any problems, questions or concerns. No barriers to learning was detected.  I spent 15 minutes counseling the patient face to face. The total time spent in the appointment was 20 minutes and more than 50% was on counseling and review of test results  Heath Lark, MD 05/23/2018 11:06 AM

## 2018-05-23 NOTE — Assessment & Plan Note (Signed)
I reminded the patient and his wife the importance of getting his TSH monitored by his primary care doctor

## 2018-05-23 NOTE — Assessment & Plan Note (Signed)
He has no signs of cancer recurrence I recommend follow-up with ENT in 6 months I will see him next year for further follow-up

## 2018-05-23 NOTE — Assessment & Plan Note (Signed)
He continues to have alcohol abuse I have discussed this with his wife extensively over the telephone I reminded the patient the importance of alcohol cessation

## 2018-05-23 NOTE — Assessment & Plan Note (Signed)
The patient is diagnosed with dementia His chronic alcohol intake is unlikely going to help him in the long run He had recent fall and I told his wife to monitor his status in the independent living facility.

## 2018-05-28 DIAGNOSIS — M6281 Muscle weakness (generalized): Secondary | ICD-10-CM | POA: Diagnosis not present

## 2018-05-28 DIAGNOSIS — R2681 Unsteadiness on feet: Secondary | ICD-10-CM | POA: Diagnosis not present

## 2018-05-28 DIAGNOSIS — R278 Other lack of coordination: Secondary | ICD-10-CM | POA: Diagnosis not present

## 2018-05-28 DIAGNOSIS — R262 Difficulty in walking, not elsewhere classified: Secondary | ICD-10-CM | POA: Diagnosis not present

## 2018-05-29 DIAGNOSIS — R2681 Unsteadiness on feet: Secondary | ICD-10-CM | POA: Diagnosis not present

## 2018-05-29 DIAGNOSIS — R262 Difficulty in walking, not elsewhere classified: Secondary | ICD-10-CM | POA: Diagnosis not present

## 2018-05-29 DIAGNOSIS — M6281 Muscle weakness (generalized): Secondary | ICD-10-CM | POA: Diagnosis not present

## 2018-05-29 DIAGNOSIS — R278 Other lack of coordination: Secondary | ICD-10-CM | POA: Diagnosis not present

## 2018-05-30 DIAGNOSIS — R278 Other lack of coordination: Secondary | ICD-10-CM | POA: Diagnosis not present

## 2018-05-30 DIAGNOSIS — R262 Difficulty in walking, not elsewhere classified: Secondary | ICD-10-CM | POA: Diagnosis not present

## 2018-05-30 DIAGNOSIS — R2681 Unsteadiness on feet: Secondary | ICD-10-CM | POA: Diagnosis not present

## 2018-05-30 DIAGNOSIS — M6281 Muscle weakness (generalized): Secondary | ICD-10-CM | POA: Diagnosis not present

## 2018-06-02 DIAGNOSIS — R278 Other lack of coordination: Secondary | ICD-10-CM | POA: Diagnosis not present

## 2018-06-02 DIAGNOSIS — R262 Difficulty in walking, not elsewhere classified: Secondary | ICD-10-CM | POA: Diagnosis not present

## 2018-06-02 DIAGNOSIS — M6281 Muscle weakness (generalized): Secondary | ICD-10-CM | POA: Diagnosis not present

## 2018-06-02 DIAGNOSIS — R2681 Unsteadiness on feet: Secondary | ICD-10-CM | POA: Diagnosis not present

## 2018-06-04 DIAGNOSIS — R2681 Unsteadiness on feet: Secondary | ICD-10-CM | POA: Diagnosis not present

## 2018-06-04 DIAGNOSIS — M6281 Muscle weakness (generalized): Secondary | ICD-10-CM | POA: Diagnosis not present

## 2018-06-04 DIAGNOSIS — R278 Other lack of coordination: Secondary | ICD-10-CM | POA: Diagnosis not present

## 2018-06-04 DIAGNOSIS — R262 Difficulty in walking, not elsewhere classified: Secondary | ICD-10-CM | POA: Diagnosis not present

## 2018-06-06 DIAGNOSIS — R278 Other lack of coordination: Secondary | ICD-10-CM | POA: Diagnosis not present

## 2018-06-06 DIAGNOSIS — R262 Difficulty in walking, not elsewhere classified: Secondary | ICD-10-CM | POA: Diagnosis not present

## 2018-06-06 DIAGNOSIS — R2681 Unsteadiness on feet: Secondary | ICD-10-CM | POA: Diagnosis not present

## 2018-06-06 DIAGNOSIS — M6281 Muscle weakness (generalized): Secondary | ICD-10-CM | POA: Diagnosis not present

## 2018-06-10 DIAGNOSIS — R278 Other lack of coordination: Secondary | ICD-10-CM | POA: Diagnosis not present

## 2018-06-10 DIAGNOSIS — M6281 Muscle weakness (generalized): Secondary | ICD-10-CM | POA: Diagnosis not present

## 2018-06-10 DIAGNOSIS — R2681 Unsteadiness on feet: Secondary | ICD-10-CM | POA: Diagnosis not present

## 2018-06-10 DIAGNOSIS — R262 Difficulty in walking, not elsewhere classified: Secondary | ICD-10-CM | POA: Diagnosis not present

## 2018-06-12 DIAGNOSIS — R262 Difficulty in walking, not elsewhere classified: Secondary | ICD-10-CM | POA: Diagnosis not present

## 2018-06-12 DIAGNOSIS — R278 Other lack of coordination: Secondary | ICD-10-CM | POA: Diagnosis not present

## 2018-06-12 DIAGNOSIS — M6281 Muscle weakness (generalized): Secondary | ICD-10-CM | POA: Diagnosis not present

## 2018-06-12 DIAGNOSIS — R2681 Unsteadiness on feet: Secondary | ICD-10-CM | POA: Diagnosis not present

## 2018-06-16 DIAGNOSIS — M6281 Muscle weakness (generalized): Secondary | ICD-10-CM | POA: Diagnosis not present

## 2018-06-16 DIAGNOSIS — R2681 Unsteadiness on feet: Secondary | ICD-10-CM | POA: Diagnosis not present

## 2018-06-16 DIAGNOSIS — R278 Other lack of coordination: Secondary | ICD-10-CM | POA: Diagnosis not present

## 2018-06-16 DIAGNOSIS — R262 Difficulty in walking, not elsewhere classified: Secondary | ICD-10-CM | POA: Diagnosis not present

## 2018-06-18 DIAGNOSIS — R278 Other lack of coordination: Secondary | ICD-10-CM | POA: Diagnosis not present

## 2018-06-18 DIAGNOSIS — R262 Difficulty in walking, not elsewhere classified: Secondary | ICD-10-CM | POA: Diagnosis not present

## 2018-06-18 DIAGNOSIS — M6281 Muscle weakness (generalized): Secondary | ICD-10-CM | POA: Diagnosis not present

## 2018-06-18 DIAGNOSIS — R2681 Unsteadiness on feet: Secondary | ICD-10-CM | POA: Diagnosis not present

## 2018-06-24 DIAGNOSIS — R278 Other lack of coordination: Secondary | ICD-10-CM | POA: Diagnosis not present

## 2018-06-24 DIAGNOSIS — M6281 Muscle weakness (generalized): Secondary | ICD-10-CM | POA: Diagnosis not present

## 2018-06-24 DIAGNOSIS — R262 Difficulty in walking, not elsewhere classified: Secondary | ICD-10-CM | POA: Diagnosis not present

## 2018-06-24 DIAGNOSIS — R2681 Unsteadiness on feet: Secondary | ICD-10-CM | POA: Diagnosis not present

## 2018-07-03 DIAGNOSIS — W19XXXA Unspecified fall, initial encounter: Secondary | ICD-10-CM | POA: Diagnosis not present

## 2018-07-03 DIAGNOSIS — S0081XA Abrasion of other part of head, initial encounter: Secondary | ICD-10-CM | POA: Diagnosis not present

## 2018-07-03 DIAGNOSIS — R5381 Other malaise: Secondary | ICD-10-CM | POA: Diagnosis not present

## 2018-08-06 DIAGNOSIS — Z79899 Other long term (current) drug therapy: Secondary | ICD-10-CM | POA: Diagnosis not present

## 2018-08-06 DIAGNOSIS — R251 Tremor, unspecified: Secondary | ICD-10-CM | POA: Diagnosis not present

## 2018-08-06 DIAGNOSIS — E539 Vitamin B deficiency, unspecified: Secondary | ICD-10-CM | POA: Diagnosis not present

## 2018-08-07 DIAGNOSIS — G621 Alcoholic polyneuropathy: Secondary | ICD-10-CM | POA: Diagnosis not present

## 2018-08-07 DIAGNOSIS — M6281 Muscle weakness (generalized): Secondary | ICD-10-CM | POA: Diagnosis not present

## 2018-08-07 DIAGNOSIS — R260 Ataxic gait: Secondary | ICD-10-CM | POA: Diagnosis not present

## 2018-08-07 DIAGNOSIS — R2681 Unsteadiness on feet: Secondary | ICD-10-CM | POA: Diagnosis not present

## 2018-08-11 DIAGNOSIS — R2681 Unsteadiness on feet: Secondary | ICD-10-CM | POA: Diagnosis not present

## 2018-08-11 DIAGNOSIS — R260 Ataxic gait: Secondary | ICD-10-CM | POA: Diagnosis not present

## 2018-08-11 DIAGNOSIS — M6281 Muscle weakness (generalized): Secondary | ICD-10-CM | POA: Diagnosis not present

## 2018-08-11 DIAGNOSIS — G621 Alcoholic polyneuropathy: Secondary | ICD-10-CM | POA: Diagnosis not present

## 2018-08-13 DIAGNOSIS — R2681 Unsteadiness on feet: Secondary | ICD-10-CM | POA: Diagnosis not present

## 2018-08-13 DIAGNOSIS — R260 Ataxic gait: Secondary | ICD-10-CM | POA: Diagnosis not present

## 2018-08-13 DIAGNOSIS — G621 Alcoholic polyneuropathy: Secondary | ICD-10-CM | POA: Diagnosis not present

## 2018-08-13 DIAGNOSIS — M6281 Muscle weakness (generalized): Secondary | ICD-10-CM | POA: Diagnosis not present

## 2018-08-15 DIAGNOSIS — R2681 Unsteadiness on feet: Secondary | ICD-10-CM | POA: Diagnosis not present

## 2018-08-15 DIAGNOSIS — M6281 Muscle weakness (generalized): Secondary | ICD-10-CM | POA: Diagnosis not present

## 2018-08-15 DIAGNOSIS — G621 Alcoholic polyneuropathy: Secondary | ICD-10-CM | POA: Diagnosis not present

## 2018-08-15 DIAGNOSIS — R260 Ataxic gait: Secondary | ICD-10-CM | POA: Diagnosis not present

## 2018-08-18 DIAGNOSIS — R2681 Unsteadiness on feet: Secondary | ICD-10-CM | POA: Diagnosis not present

## 2018-08-18 DIAGNOSIS — M6281 Muscle weakness (generalized): Secondary | ICD-10-CM | POA: Diagnosis not present

## 2018-08-18 DIAGNOSIS — G621 Alcoholic polyneuropathy: Secondary | ICD-10-CM | POA: Diagnosis not present

## 2018-08-18 DIAGNOSIS — R260 Ataxic gait: Secondary | ICD-10-CM | POA: Diagnosis not present

## 2018-08-20 DIAGNOSIS — R2681 Unsteadiness on feet: Secondary | ICD-10-CM | POA: Diagnosis not present

## 2018-08-20 DIAGNOSIS — E7800 Pure hypercholesterolemia, unspecified: Secondary | ICD-10-CM | POA: Diagnosis not present

## 2018-08-20 DIAGNOSIS — G621 Alcoholic polyneuropathy: Secondary | ICD-10-CM | POA: Diagnosis not present

## 2018-08-20 DIAGNOSIS — R251 Tremor, unspecified: Secondary | ICD-10-CM | POA: Diagnosis not present

## 2018-08-20 DIAGNOSIS — M6281 Muscle weakness (generalized): Secondary | ICD-10-CM | POA: Diagnosis not present

## 2018-08-20 DIAGNOSIS — R260 Ataxic gait: Secondary | ICD-10-CM | POA: Diagnosis not present

## 2018-08-20 DIAGNOSIS — F901 Attention-deficit hyperactivity disorder, predominantly hyperactive type: Secondary | ICD-10-CM | POA: Diagnosis not present

## 2018-08-20 DIAGNOSIS — I10 Essential (primary) hypertension: Secondary | ICD-10-CM | POA: Diagnosis not present

## 2018-08-22 DIAGNOSIS — R260 Ataxic gait: Secondary | ICD-10-CM | POA: Diagnosis not present

## 2018-08-22 DIAGNOSIS — R2681 Unsteadiness on feet: Secondary | ICD-10-CM | POA: Diagnosis not present

## 2018-08-22 DIAGNOSIS — M6281 Muscle weakness (generalized): Secondary | ICD-10-CM | POA: Diagnosis not present

## 2018-08-22 DIAGNOSIS — G621 Alcoholic polyneuropathy: Secondary | ICD-10-CM | POA: Diagnosis not present

## 2018-08-25 DIAGNOSIS — G621 Alcoholic polyneuropathy: Secondary | ICD-10-CM | POA: Diagnosis not present

## 2018-08-25 DIAGNOSIS — R2681 Unsteadiness on feet: Secondary | ICD-10-CM | POA: Diagnosis not present

## 2018-08-25 DIAGNOSIS — Z20828 Contact with and (suspected) exposure to other viral communicable diseases: Secondary | ICD-10-CM | POA: Diagnosis not present

## 2018-08-25 DIAGNOSIS — R260 Ataxic gait: Secondary | ICD-10-CM | POA: Diagnosis not present

## 2018-08-25 DIAGNOSIS — M6281 Muscle weakness (generalized): Secondary | ICD-10-CM | POA: Diagnosis not present

## 2018-08-27 DIAGNOSIS — M6281 Muscle weakness (generalized): Secondary | ICD-10-CM | POA: Diagnosis not present

## 2018-08-27 DIAGNOSIS — G621 Alcoholic polyneuropathy: Secondary | ICD-10-CM | POA: Diagnosis not present

## 2018-08-27 DIAGNOSIS — R2681 Unsteadiness on feet: Secondary | ICD-10-CM | POA: Diagnosis not present

## 2018-08-27 DIAGNOSIS — R260 Ataxic gait: Secondary | ICD-10-CM | POA: Diagnosis not present

## 2018-08-28 DIAGNOSIS — E781 Pure hyperglyceridemia: Secondary | ICD-10-CM | POA: Diagnosis not present

## 2018-08-28 DIAGNOSIS — N401 Enlarged prostate with lower urinary tract symptoms: Secondary | ICD-10-CM | POA: Diagnosis not present

## 2018-08-28 DIAGNOSIS — G459 Transient cerebral ischemic attack, unspecified: Secondary | ICD-10-CM | POA: Diagnosis not present

## 2018-08-28 DIAGNOSIS — E7800 Pure hypercholesterolemia, unspecified: Secondary | ICD-10-CM | POA: Diagnosis not present

## 2018-08-28 DIAGNOSIS — I10 Essential (primary) hypertension: Secondary | ICD-10-CM | POA: Diagnosis not present

## 2018-08-28 DIAGNOSIS — E039 Hypothyroidism, unspecified: Secondary | ICD-10-CM | POA: Diagnosis not present

## 2018-08-28 DIAGNOSIS — E782 Mixed hyperlipidemia: Secondary | ICD-10-CM | POA: Diagnosis not present

## 2018-08-29 DIAGNOSIS — G621 Alcoholic polyneuropathy: Secondary | ICD-10-CM | POA: Diagnosis not present

## 2018-08-29 DIAGNOSIS — R2681 Unsteadiness on feet: Secondary | ICD-10-CM | POA: Diagnosis not present

## 2018-08-29 DIAGNOSIS — M6281 Muscle weakness (generalized): Secondary | ICD-10-CM | POA: Diagnosis not present

## 2018-08-29 DIAGNOSIS — R260 Ataxic gait: Secondary | ICD-10-CM | POA: Diagnosis not present

## 2018-09-01 DIAGNOSIS — R260 Ataxic gait: Secondary | ICD-10-CM | POA: Diagnosis not present

## 2018-09-01 DIAGNOSIS — M6281 Muscle weakness (generalized): Secondary | ICD-10-CM | POA: Diagnosis not present

## 2018-09-01 DIAGNOSIS — R2681 Unsteadiness on feet: Secondary | ICD-10-CM | POA: Diagnosis not present

## 2018-09-01 DIAGNOSIS — G621 Alcoholic polyneuropathy: Secondary | ICD-10-CM | POA: Diagnosis not present

## 2018-09-03 DIAGNOSIS — R2681 Unsteadiness on feet: Secondary | ICD-10-CM | POA: Diagnosis not present

## 2018-09-03 DIAGNOSIS — R260 Ataxic gait: Secondary | ICD-10-CM | POA: Diagnosis not present

## 2018-09-03 DIAGNOSIS — M6281 Muscle weakness (generalized): Secondary | ICD-10-CM | POA: Diagnosis not present

## 2018-09-03 DIAGNOSIS — R296 Repeated falls: Secondary | ICD-10-CM | POA: Diagnosis not present

## 2018-09-05 DIAGNOSIS — R260 Ataxic gait: Secondary | ICD-10-CM | POA: Diagnosis not present

## 2018-09-05 DIAGNOSIS — R2681 Unsteadiness on feet: Secondary | ICD-10-CM | POA: Diagnosis not present

## 2018-09-05 DIAGNOSIS — R296 Repeated falls: Secondary | ICD-10-CM | POA: Diagnosis not present

## 2018-09-05 DIAGNOSIS — M6281 Muscle weakness (generalized): Secondary | ICD-10-CM | POA: Diagnosis not present

## 2018-09-10 DIAGNOSIS — R296 Repeated falls: Secondary | ICD-10-CM | POA: Diagnosis not present

## 2018-09-10 DIAGNOSIS — M6281 Muscle weakness (generalized): Secondary | ICD-10-CM | POA: Diagnosis not present

## 2018-09-10 DIAGNOSIS — R260 Ataxic gait: Secondary | ICD-10-CM | POA: Diagnosis not present

## 2018-09-10 DIAGNOSIS — R2681 Unsteadiness on feet: Secondary | ICD-10-CM | POA: Diagnosis not present

## 2018-09-12 DIAGNOSIS — R296 Repeated falls: Secondary | ICD-10-CM | POA: Diagnosis not present

## 2018-09-12 DIAGNOSIS — M6281 Muscle weakness (generalized): Secondary | ICD-10-CM | POA: Diagnosis not present

## 2018-09-12 DIAGNOSIS — R260 Ataxic gait: Secondary | ICD-10-CM | POA: Diagnosis not present

## 2018-09-12 DIAGNOSIS — R2681 Unsteadiness on feet: Secondary | ICD-10-CM | POA: Diagnosis not present

## 2018-09-15 DIAGNOSIS — R2681 Unsteadiness on feet: Secondary | ICD-10-CM | POA: Diagnosis not present

## 2018-09-15 DIAGNOSIS — M6281 Muscle weakness (generalized): Secondary | ICD-10-CM | POA: Diagnosis not present

## 2018-09-15 DIAGNOSIS — R296 Repeated falls: Secondary | ICD-10-CM | POA: Diagnosis not present

## 2018-09-15 DIAGNOSIS — R260 Ataxic gait: Secondary | ICD-10-CM | POA: Diagnosis not present

## 2018-09-17 DIAGNOSIS — M6281 Muscle weakness (generalized): Secondary | ICD-10-CM | POA: Diagnosis not present

## 2018-09-17 DIAGNOSIS — R2681 Unsteadiness on feet: Secondary | ICD-10-CM | POA: Diagnosis not present

## 2018-09-17 DIAGNOSIS — R296 Repeated falls: Secondary | ICD-10-CM | POA: Diagnosis not present

## 2018-09-17 DIAGNOSIS — R260 Ataxic gait: Secondary | ICD-10-CM | POA: Diagnosis not present

## 2018-09-22 DIAGNOSIS — M6281 Muscle weakness (generalized): Secondary | ICD-10-CM | POA: Diagnosis not present

## 2018-09-22 DIAGNOSIS — R296 Repeated falls: Secondary | ICD-10-CM | POA: Diagnosis not present

## 2018-09-22 DIAGNOSIS — R2681 Unsteadiness on feet: Secondary | ICD-10-CM | POA: Diagnosis not present

## 2018-09-22 DIAGNOSIS — R260 Ataxic gait: Secondary | ICD-10-CM | POA: Diagnosis not present

## 2018-09-24 DIAGNOSIS — R260 Ataxic gait: Secondary | ICD-10-CM | POA: Diagnosis not present

## 2018-09-24 DIAGNOSIS — R296 Repeated falls: Secondary | ICD-10-CM | POA: Diagnosis not present

## 2018-09-24 DIAGNOSIS — R2681 Unsteadiness on feet: Secondary | ICD-10-CM | POA: Diagnosis not present

## 2018-09-24 DIAGNOSIS — M6281 Muscle weakness (generalized): Secondary | ICD-10-CM | POA: Diagnosis not present

## 2018-10-01 DIAGNOSIS — N401 Enlarged prostate with lower urinary tract symptoms: Secondary | ICD-10-CM | POA: Diagnosis not present

## 2018-10-01 DIAGNOSIS — G459 Transient cerebral ischemic attack, unspecified: Secondary | ICD-10-CM | POA: Diagnosis not present

## 2018-10-01 DIAGNOSIS — E781 Pure hyperglyceridemia: Secondary | ICD-10-CM | POA: Diagnosis not present

## 2018-10-01 DIAGNOSIS — E782 Mixed hyperlipidemia: Secondary | ICD-10-CM | POA: Diagnosis not present

## 2018-10-01 DIAGNOSIS — E039 Hypothyroidism, unspecified: Secondary | ICD-10-CM | POA: Diagnosis not present

## 2018-10-01 DIAGNOSIS — I10 Essential (primary) hypertension: Secondary | ICD-10-CM | POA: Diagnosis not present

## 2018-10-01 DIAGNOSIS — E7800 Pure hypercholesterolemia, unspecified: Secondary | ICD-10-CM | POA: Diagnosis not present

## 2018-10-06 DIAGNOSIS — R260 Ataxic gait: Secondary | ICD-10-CM | POA: Diagnosis not present

## 2018-10-06 DIAGNOSIS — R296 Repeated falls: Secondary | ICD-10-CM | POA: Diagnosis not present

## 2018-10-06 DIAGNOSIS — R2681 Unsteadiness on feet: Secondary | ICD-10-CM | POA: Diagnosis not present

## 2018-10-06 DIAGNOSIS — M6281 Muscle weakness (generalized): Secondary | ICD-10-CM | POA: Diagnosis not present

## 2018-10-08 DIAGNOSIS — R296 Repeated falls: Secondary | ICD-10-CM | POA: Diagnosis not present

## 2018-10-08 DIAGNOSIS — M6281 Muscle weakness (generalized): Secondary | ICD-10-CM | POA: Diagnosis not present

## 2018-10-08 DIAGNOSIS — R260 Ataxic gait: Secondary | ICD-10-CM | POA: Diagnosis not present

## 2018-10-08 DIAGNOSIS — R2681 Unsteadiness on feet: Secondary | ICD-10-CM | POA: Diagnosis not present

## 2018-10-13 DIAGNOSIS — R2681 Unsteadiness on feet: Secondary | ICD-10-CM | POA: Diagnosis not present

## 2018-10-13 DIAGNOSIS — M6281 Muscle weakness (generalized): Secondary | ICD-10-CM | POA: Diagnosis not present

## 2018-10-13 DIAGNOSIS — R296 Repeated falls: Secondary | ICD-10-CM | POA: Diagnosis not present

## 2018-10-13 DIAGNOSIS — R260 Ataxic gait: Secondary | ICD-10-CM | POA: Diagnosis not present

## 2018-10-15 DIAGNOSIS — R2681 Unsteadiness on feet: Secondary | ICD-10-CM | POA: Diagnosis not present

## 2018-10-15 DIAGNOSIS — R260 Ataxic gait: Secondary | ICD-10-CM | POA: Diagnosis not present

## 2018-10-15 DIAGNOSIS — R296 Repeated falls: Secondary | ICD-10-CM | POA: Diagnosis not present

## 2018-10-15 DIAGNOSIS — M6281 Muscle weakness (generalized): Secondary | ICD-10-CM | POA: Diagnosis not present

## 2018-10-17 DIAGNOSIS — R260 Ataxic gait: Secondary | ICD-10-CM | POA: Diagnosis not present

## 2018-10-17 DIAGNOSIS — R2681 Unsteadiness on feet: Secondary | ICD-10-CM | POA: Diagnosis not present

## 2018-10-17 DIAGNOSIS — R296 Repeated falls: Secondary | ICD-10-CM | POA: Diagnosis not present

## 2018-10-17 DIAGNOSIS — M6281 Muscle weakness (generalized): Secondary | ICD-10-CM | POA: Diagnosis not present

## 2018-10-20 DIAGNOSIS — R260 Ataxic gait: Secondary | ICD-10-CM | POA: Diagnosis not present

## 2018-10-20 DIAGNOSIS — R296 Repeated falls: Secondary | ICD-10-CM | POA: Diagnosis not present

## 2018-10-20 DIAGNOSIS — R2681 Unsteadiness on feet: Secondary | ICD-10-CM | POA: Diagnosis not present

## 2018-10-20 DIAGNOSIS — M6281 Muscle weakness (generalized): Secondary | ICD-10-CM | POA: Diagnosis not present

## 2018-10-22 ENCOUNTER — Telehealth: Payer: Self-pay | Admitting: Neurology

## 2018-10-22 NOTE — Telephone Encounter (Signed)
Patient's wife called and scheduled a follow up appointment with Dr. Posey Pronto on 10/27/18 at 10:30 AM.   She's like Dr. Posey Pronto to see the patient's legs at the visit stating he has become more feeble and has shakier legs than before. She said he had another fall last night. Patient continues to live at [facility].  Patient's wife also wants Dr. Posey Pronto to be aware the patient is continuing to drive although his family and friends feel it is unsafe to do so. She's like this addressed at the visit as well.  Per patient's wife, no need to call her back prior to the appointment. FYI only.

## 2018-10-23 DIAGNOSIS — R260 Ataxic gait: Secondary | ICD-10-CM | POA: Diagnosis not present

## 2018-10-23 DIAGNOSIS — R2681 Unsteadiness on feet: Secondary | ICD-10-CM | POA: Diagnosis not present

## 2018-10-23 DIAGNOSIS — M6281 Muscle weakness (generalized): Secondary | ICD-10-CM | POA: Diagnosis not present

## 2018-10-23 DIAGNOSIS — R296 Repeated falls: Secondary | ICD-10-CM | POA: Diagnosis not present

## 2018-10-27 ENCOUNTER — Encounter: Payer: Self-pay | Admitting: Neurology

## 2018-10-27 ENCOUNTER — Ambulatory Visit: Payer: Medicare Other | Admitting: Neurology

## 2018-10-27 DIAGNOSIS — Z029 Encounter for administrative examinations, unspecified: Secondary | ICD-10-CM

## 2018-10-27 NOTE — Progress Notes (Deleted)
Follow-up Visit   Date: 10/27/18    Dylan Moreno MRN: [id number] DOB: [birth year]   Interim History: Dylan Moreno is a 78 y.o. right-handed Caucasian male with history of hyperlipidemia, tobacco use, ADD, TIA (12/29/2012), squamous cell carcinoma s/p chemo/radiation (Spring 2016) returning to the clinic for complaints of memory impairment.  The patient was accompanied to the clinic by wife.  History of present illness: He presented to the Emergency Department on 12/29/2012 with acute onset of left facial droop, lasting one hour. MRI/A of the brain did not show acute stroke. There was a note of ventriculomegaly suggestive of NPH. US carotids, echo, and holter was normal. He was previously taking lipitor 10mg  and was taking aspirin 81mg  daily. He was discharged on aspirin 325mg .  However, upon follow-up visit with me, I switched him to plavix 75mg  as he was already taking aspirin.  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 Neuropsychological testing with Dr. Valentina Shaggy showed neurocognitive disorder, unspecified, and ADHD.  He was cleared to drive by occupational therapy.   In 2018, he started having greater difficulty using his phone and remote control, as well as increased forgetfulness with events/conversations.  He tends to be irritable at night, which he wife attributes to his drinking.  Despite my recommendation to abstain from alcohol, he continues to consume 2-3 drinks nightly.  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  He has not been compliant with his medications for ADHD.   His wife states that he continues to drink 3-4 oz of gin nightly.  He disagrees with his wife about his alcohol consumption and gets easily irritated  when this is mentioned.    He moved in independent living at [facility] in December 2018.    UPDATE 12/09/2017:  He is here for follow-up visit.  He continues to live alone at [facility] independent living.  His wife states that he can be more forgetful and sometimes forgets to takes his medications.  No behavior changes.  He continues to engage socially with residents there.  He continues to drive and prepare his own meals.  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  He suffered one mechanical fall after loosing balance in the grass, but did not have any injuries.  He continues to drink alcohol nightly.   He has been more fatigued lately and Dr. Delfina Redwood has been adjusting his thyroid medication.  He also complaints of bilateral ringing in the ears.  No dizziness or vertigo.   UPDATE 10/27/2018:  ***  Medications:  Current Outpatient Medications on File Prior to Visit  Medication Sig Dispense Refill  . amphetamine-dextroamphetamine (ADDERALL) 20 MG tablet Take 20 mg by mouth daily. Reported on 12/22/2014    . atorvastatin (LIPITOR) 20 MG tablet Place 1 tablet (20 mg total) into feeding tube daily at 6 PM. (Patient taking differently: Take 20 mg by mouth daily at 6 PM. ) 30 tablet 0  . clopidogrel (PLAVIX) 75 MG tablet Place 1 tablet (75 mg total) into feeding tube daily. (Patient taking differently: Take 75 mg by mouth daily. ) 30 tablet 0  . donepezil (ARICEPT) 10 MG tablet TAKE 1 TABLET BY MOUTH EVERY DAY 90 tablet 3  . levothyroxine (SYNTHROID, LEVOTHROID) 112 MCG tablet Take  112 mcg by mouth daily.  3  . sodium fluoride (PREVIDENT 5000 PLUS) 1.1 % CREA dental cream Apply to tooth brush. Brush teeth for 2 minutes. Spit out excess-DO NOT swallow. Repeat nightly. 1 Tube prn  . tamsulosin (FLOMAX) 0.4 MG CAPS capsule Take 0.4 mg by mouth daily.  3  . thiamine 100 MG tablet Take 100 mg by mouth daily.      No current facility-administered medications on file prior to visit.     Allergies: No Known Allergies    Review of Systems:  CONSTITUTIONAL: No fevers, chills, night sweats, or weight loss.   EYES: No visual changes or eye pain ENT: No hearing changes.  No history of nose bleeds.  +ringing in the ears RESPIRATORY: No cough, wheezing and shortness of breath.   CARDIOVASCULAR: Negative for chest pain, and palpitations.   GI: Negative for abdominal discomfort, blood in stools or black stools.  No recent change in bowel habits.   GU:  No history of incontinence.   MUSCLOSKELETAL: No history of joint pain or swelling.  No myalgias.   SKIN: Negative for lesions, rash, and itching.   ENDOCRINE: Negative for cold or heat intolerance, polydipsia or goiter.   PSYCH:  No depression or anxiety symptoms.   NEURO: As Above.   Vital Signs:  There were no vitals taken for this visit.  General Medical Exam:   General:  Well appearing, comfortable  Eyes/ENT: see cranial nerve examination.   Neck: No masses appreciated.  Full range of motion without tenderness.  No carotid bruits. Respiratory:  Clear to auscultation, good air entry bilaterally.   Cardiac:  Regular rate and rhythm, no murmur.   Ext:  No edema  Neurological Exam: MENTAL STATUS:  Alert, awake, and easily engages in conversation. Well groomed and dressed. Oriented x5. Speech is not dysarthric, but there is intermittent stuttering of speech (old)  CRANIAL NERVES:  Pupils round and reactive to light.  Normal conjugate, extra-ocular eye movements in all directions of gaze.  No ptosis. Face is symmetric. Palate elevates symmetrically.  Tongue is midline.  MOTOR:  Motor strength is 5/5 in all extremities, except distally with toe extension 4/5.  REFLEXES: Reflexes are 2+/4 in the arms, 1+/4 patella jerks, and absent in the ankles.  SENSORY:  Vibration is mildly reduced at the ankles.  Rhomberg sign is positive.   COORDINATION/GAIT:  Gait appears stable, unassisted.  Data: US carotids 12/30/2012: Findings suggest 1-39% internal  carotid artery stenosis bilaterally. Vertebral arteries are patent with antegrade flow.   MRI/A brain 12/29/2012:  1. Ventricular prominence is disproportionate to the degree of atrophy, suggesting normal pressure hydrocephalus.  2. No other acute intracranial abnormality. Specifically, there is no evidence for acute or subacute infarct.  3. Mild distal small vessel disease is evident on the MRA without significant proximal stenosis, aneurysm, or branch vessel occlusion.   48-hr holter:  NSR, rare PVCs  CT head 06/04/2014: Ventricular prominence similar to prior MR suggesting normal pressure hydrocephalus. No intracranial hemorrhage. No CT evidence of large acute infarct. No intracranial mass or abnormal enhancement.  CTA head and neck 06/04/2014:   Calcified plaque with mild to slightly moderate narrowing cavernous segment internal carotid artery bilaterally. Mild narrowing and irregularity M1 segment left middle cerebral artery. Mild narrowing distal left vertebral artery.  MRI lumbar spine wo contrast 09/30/2014: 1. Minimal left foraminal narrowing at L4-5 and L5-S1 secondary to leftward disc protrusions and facet hypertrophy. 2. Leftward disc protrusion at L3-4 without significant  stenosis.  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  Lab Results  Component Value Date   TSH 6.559 (H) 05/15/2017   Lab Results  Component Value Date   VITAMINB12 407 03/06/2017     IMPRESSION: 1.  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  Overall there is mild progression, he needs assistance with managing finances and medications which his wife does. Housecleaning is provided by independent living facility.  I will keep him on  donepezil 10mg  daily.   He was encouraged to use pillbox with alarm to optimize medication compliance  2.  Alcoholic peripheral neuropathy causing sensory ataxia, stable.  Counseled on alcohol cessation.  Always use a cane when on uneven ground.  3.  Tinnitus - follow-up with ENT  4. .  Ventricular prominence on CT head which as been stable since 2014.  No clinical signs of NPH - although he had dementia, gait does not appear magnetic and he does not have incontinence.  Gait has been stable.   5. 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  No recurrent neurological events.  Continue plavix 75mg  daily, statin therapy, and BP medication for secondary prevention.  ***  Thank you for allowing me to participate in patient's care.  If I can answer any additional questions, I would be pleased to do so.    Sincerely,    Nitesh Pitstick K. Posey Pronto, DO

## 2018-10-29 DIAGNOSIS — R2681 Unsteadiness on feet: Secondary | ICD-10-CM | POA: Diagnosis not present

## 2018-10-29 DIAGNOSIS — R296 Repeated falls: Secondary | ICD-10-CM | POA: Diagnosis not present

## 2018-10-29 DIAGNOSIS — R260 Ataxic gait: Secondary | ICD-10-CM | POA: Diagnosis not present

## 2018-10-29 DIAGNOSIS — M6281 Muscle weakness (generalized): Secondary | ICD-10-CM | POA: Diagnosis not present

## 2018-10-31 DIAGNOSIS — R296 Repeated falls: Secondary | ICD-10-CM | POA: Diagnosis not present

## 2018-10-31 DIAGNOSIS — M6281 Muscle weakness (generalized): Secondary | ICD-10-CM | POA: Diagnosis not present

## 2018-10-31 DIAGNOSIS — R260 Ataxic gait: Secondary | ICD-10-CM | POA: Diagnosis not present

## 2018-10-31 DIAGNOSIS — R2681 Unsteadiness on feet: Secondary | ICD-10-CM | POA: Diagnosis not present

## 2018-11-05 DIAGNOSIS — R2681 Unsteadiness on feet: Secondary | ICD-10-CM | POA: Diagnosis not present

## 2018-11-05 DIAGNOSIS — R296 Repeated falls: Secondary | ICD-10-CM | POA: Diagnosis not present

## 2018-11-05 DIAGNOSIS — R260 Ataxic gait: Secondary | ICD-10-CM | POA: Diagnosis not present

## 2018-11-05 DIAGNOSIS — N3941 Urge incontinence: Secondary | ICD-10-CM | POA: Diagnosis not present

## 2018-11-05 DIAGNOSIS — M6281 Muscle weakness (generalized): Secondary | ICD-10-CM | POA: Diagnosis not present

## 2018-11-07 DIAGNOSIS — R296 Repeated falls: Secondary | ICD-10-CM | POA: Diagnosis not present

## 2018-11-07 DIAGNOSIS — R260 Ataxic gait: Secondary | ICD-10-CM | POA: Diagnosis not present

## 2018-11-07 DIAGNOSIS — M6281 Muscle weakness (generalized): Secondary | ICD-10-CM | POA: Diagnosis not present

## 2018-11-07 DIAGNOSIS — R2681 Unsteadiness on feet: Secondary | ICD-10-CM | POA: Diagnosis not present

## 2018-11-10 DIAGNOSIS — R260 Ataxic gait: Secondary | ICD-10-CM | POA: Diagnosis not present

## 2018-11-10 DIAGNOSIS — R2681 Unsteadiness on feet: Secondary | ICD-10-CM | POA: Diagnosis not present

## 2018-11-10 DIAGNOSIS — M6281 Muscle weakness (generalized): Secondary | ICD-10-CM | POA: Diagnosis not present

## 2018-11-10 DIAGNOSIS — R296 Repeated falls: Secondary | ICD-10-CM | POA: Diagnosis not present

## 2018-11-13 DIAGNOSIS — R2681 Unsteadiness on feet: Secondary | ICD-10-CM | POA: Diagnosis not present

## 2018-11-13 DIAGNOSIS — R260 Ataxic gait: Secondary | ICD-10-CM | POA: Diagnosis not present

## 2018-11-13 DIAGNOSIS — M6281 Muscle weakness (generalized): Secondary | ICD-10-CM | POA: Diagnosis not present

## 2018-11-13 DIAGNOSIS — R296 Repeated falls: Secondary | ICD-10-CM | POA: Diagnosis not present

## 2018-11-17 DIAGNOSIS — M6281 Muscle weakness (generalized): Secondary | ICD-10-CM | POA: Diagnosis not present

## 2018-11-17 DIAGNOSIS — R260 Ataxic gait: Secondary | ICD-10-CM | POA: Diagnosis not present

## 2018-11-17 DIAGNOSIS — R2681 Unsteadiness on feet: Secondary | ICD-10-CM | POA: Diagnosis not present

## 2018-11-17 DIAGNOSIS — R296 Repeated falls: Secondary | ICD-10-CM | POA: Diagnosis not present

## 2018-11-18 ENCOUNTER — Encounter: Payer: Self-pay | Admitting: Neurology

## 2018-11-19 ENCOUNTER — Telehealth (INDEPENDENT_AMBULATORY_CARE_PROVIDER_SITE_OTHER): Payer: Medicare Other | Admitting: Neurology

## 2018-11-19 ENCOUNTER — Other Ambulatory Visit: Payer: Self-pay

## 2018-11-19 VITALS — Ht 74.0 in | Wt 175.0 lb

## 2018-11-19 DIAGNOSIS — R269 Unspecified abnormalities of gait and mobility: Secondary | ICD-10-CM | POA: Diagnosis not present

## 2018-11-19 DIAGNOSIS — G621 Alcoholic polyneuropathy: Secondary | ICD-10-CM

## 2018-11-19 DIAGNOSIS — F0280 Dementia in other diseases classified elsewhere without behavioral disturbance: Secondary | ICD-10-CM

## 2018-11-19 NOTE — Progress Notes (Signed)
    Virtual Visit via Telephone Note The purpose of this virtual visit is to provide medical care while limiting exposure to the novel coronavirus.    Consent was obtained for phone visit:  Yes.   Answered questions that patient had about telehealth interaction:  Yes.   I discussed the limitations, risks, security and privacy concerns of performing an evaluation and management service by telephone. I also discussed with the patient that there may be a patient responsible charge related to this service. The patient expressed understanding and agreed to proceed.  Pt location: Home Physician Location: office Name of referring provider:  Seward Carol, MD I connected with .Dylan Moreno at patients initiation/request on 11/19/2018 at  8:50 AM EST by telephone and verified that I am speaking with the correct person using two identifiers.  His wife and former business partner were present. Pt MRN:  [id number] Pt DOB:  [birth year]   History of Present Illness: This is a 78 year-old man returning with complaints of worsening gait and memory.  Over the past 3 months, his gait has become more unsteady, stating that he feels that his legs are shaky and weak.  There is no associated leg or back pain.  Because of this, his family has moved him from a cottage to an interior room which is closer to the dining room, but remains in independent living at [facility].  He has fallen about 3 times over the past year. Also over the past three month, he has noticed new bladder incontinence.  Family is very concerned about his safety with driving and have advised him against this, but patient is reluctant to give up driving. Memory also has been steadily declining.  Wife fills his pillbox and nurse administers medications. He goes to the dining hall for meals.  Assessment and Plan:   Gait abnormality, worsening.  Prior imaging has shown enlarged ventricles, but he has been asymptomatic until now.  With his new complaints  of gait change and bladder incontinence, NPH needs to be considered.  I will bring him into the office for formal neuromuscular exam and determine whether he needs large volume LP.  He has known history of alcohol peripheral neuropathy and prior exam has shown sensory ataxia and it's possible this is progressing.  Exam will be telling.   Memory impairment, likely worsening dementia.  Prior neuropsychological testing has indicated mild dementia (AD vs alcohol-related).  This may be multifactorial and related to alcohol, mood disorder (ADHD), and possibly NPH.  Based on his exam in the office, I may consider repeat neuropsychological testing to assess for interval change.    Follow Up Instructions:   I discussed the assessment and treatment plan with the patient. The patient was provided an opportunity to ask questions and all were answered. The patient agreed with the plan and demonstrated an understanding of the instructions.   The patient was advised to call back or seek an in-person evaluation if the symptoms worsen or if the condition fails to improve as anticipated.  Return to clinic for evaluation  Total Time spent in visit with the patient was:  23 min, of which 100% of the time was spent in counseling and/or coordinating care.   Pt understands and agrees with the plan of care outlined.     Dylan Berthold, DO

## 2018-11-20 DIAGNOSIS — M6281 Muscle weakness (generalized): Secondary | ICD-10-CM | POA: Diagnosis not present

## 2018-11-20 DIAGNOSIS — R296 Repeated falls: Secondary | ICD-10-CM | POA: Diagnosis not present

## 2018-11-20 DIAGNOSIS — R2681 Unsteadiness on feet: Secondary | ICD-10-CM | POA: Diagnosis not present

## 2018-11-20 DIAGNOSIS — R260 Ataxic gait: Secondary | ICD-10-CM | POA: Diagnosis not present

## 2018-11-21 ENCOUNTER — Ambulatory Visit (INDEPENDENT_AMBULATORY_CARE_PROVIDER_SITE_OTHER): Payer: Medicare Other | Admitting: Neurology

## 2018-11-21 ENCOUNTER — Encounter: Payer: Self-pay | Admitting: Neurology

## 2018-11-21 ENCOUNTER — Other Ambulatory Visit: Payer: Self-pay

## 2018-11-21 VITALS — BP 148/88 | HR 77 | Ht 73.5 in | Wt 170.0 lb

## 2018-11-21 DIAGNOSIS — R269 Unspecified abnormalities of gait and mobility: Secondary | ICD-10-CM | POA: Diagnosis not present

## 2018-11-21 DIAGNOSIS — G621 Alcoholic polyneuropathy: Secondary | ICD-10-CM | POA: Diagnosis not present

## 2018-11-21 DIAGNOSIS — G919 Hydrocephalus, unspecified: Secondary | ICD-10-CM

## 2018-11-21 DIAGNOSIS — F0280 Dementia in other diseases classified elsewhere without behavioral disturbance: Secondary | ICD-10-CM

## 2018-11-21 NOTE — Progress Notes (Signed)
Follow-up Visit   Date: 11/21/18    Dylan Moreno MRN: [id number] DOB: 1940/11/03   Interim History: Dylan Moreno is a 78 y.o. right-handed Caucasian male with history of hyperlipidemia, tobacco use, ADD, TIA (12/29/2012), squamous cell carcinoma s/p chemo/radiation (Spring 2016) returning to the clinic for complaints of gait change and worsening memory.  The patient was accompanied to the clinic by wife.  History of present illness: He presented to the Emergency Department on 12/29/2012 with acute onset of left facial droop, lasting one hour. MRI/A of the brain did not show acute stroke. There was a note of ventriculomegaly.  US carotids, echo, and holter was normal. He was previously taking lipitor 10mg  and was taking aspirin 81mg  daily. He was discharged on aspirin 325mg .  However, upon follow-up visit with me, I switched him to plavix 75mg  as he was already taking aspirin.  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 Neuropsychological testing with Dr. Valentina Shaggy showed neurocognitive disorder, unspecified, and ADHD.  He was cleared to drive by occupational therapy.   In 2018, he started having greater difficulty using his phone and remote control, as well as increased forgetfulness with events/conversations.  He tends to be irritable at night, which he wife attributes to his drinking.  Despite my recommendation to abstain from alcohol, he continues to consume 2-3 drinks nightly.  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  He has not been compliant with his medications for ADHD.   He moved in independent living at [facility] in December 2018.    UPDATE 11/21/2018:   Over the past 3 months, his gait has become more unsteady and  his leg get shaky.  He has fallen several times.  Wife has noticed that steps are much smaller and he has started to use a walker.  His memory is also getting worse, she has increase caregiver support at home for medication administration and household chores. Family feels that he should not longer drive, but patient disagrees.  They moved him into unit within the main building at [place] due to his gait change.  He has been having intermittent bladder incontinence for the past 3 months which is new. He continues to drink scotch about 1.5 - 2 oz nightly.    Medications:  Current Outpatient Medications on File Prior to Visit  Medication Sig Dispense Refill  . atorvastatin (LIPITOR) 20 MG tablet Place 1 tablet (20 mg total) into feeding tube daily at 6 PM. (Patient taking differently: Take 20 mg by mouth daily at 6 PM. ) 30 tablet 0  . atorvastatin (LIPITOR) 40 MG tablet Take 40 mg by mouth daily.    . clopidogrel (PLAVIX) 75 MG tablet Place 1 tablet (75 mg total) into feeding tube daily. (Patient taking differently: Take 75 mg by mouth daily. ) 30 tablet 0  . donepezil (ARICEPT) 10 MG tablet TAKE 1 TABLET BY MOUTH EVERY DAY 90 tablet 3  . levothyroxine (SYNTHROID, LEVOTHROID) 112 MCG tablet Take 112 mcg by mouth daily.  3  . losartan (COZAAR) 25 MG tablet Take 25 mg by mouth daily.    . tamsulosin (FLOMAX) 0.4 MG CAPS capsule Take 0.4 mg by mouth daily.  3  . thiamine 100 MG tablet Take  100 mg by mouth daily.      No current facility-administered medications on file prior to visit.     Allergies: No Known Allergies   Review of Systems:  CONSTITUTIONAL: No fevers, chills, night sweats, or weight loss.   EYES: No visual changes or eye pain ENT: No hearing changes.  No history of nose bleeds.  +ringing in the ears RESPIRATORY: No cough, wheezing and shortness of breath.   CARDIOVASCULAR: Negative for chest pain, and palpitations.   GI: Negative for abdominal discomfort, blood in stools or  black stools.  No recent change in bowel habits.   GU:  +history of incontinence.   MUSCLOSKELETAL: No history of joint pain or swelling.  No myalgias.   SKIN: Negative for lesions, rash, and itching.   ENDOCRINE: Negative for cold or heat intolerance, polydipsia or goiter.   PSYCH:  No depression or anxiety symptoms.   NEURO: As Above.   Vital Signs:  BP (!) 148/88   Pulse 77   Ht 6' 1.5" (1.867 m)   Wt 170 lb (77.1 kg)   SpO2 98%   BMI 22.12 kg/m   Neurological Exam: MENTAL STATUS:  Alert, awake, and easily engages in conversation. Well groomed and dressed. Oriented x5. Speech is not dysarthric, but there is intermittent stuttering of speech (old)  CRANIAL NERVES:  Pupils round and reactive to light.  Normal conjugate, extra-ocular eye movements in all directions of gaze.  No ptosis. Face is symmetric. Palate elevates symmetrically.  Tongue is midline.  MOTOR:  Motor strength is 5/5 in all extremities, except distally with toe extension 4/5.  REFLEXES: Reflexes are 2+/4 in the arms, 2+/4 patella jerks, and absent in the ankles.  SENSORY:  Vibration is mildly reduced at the ankles.  Rhomberg sign is positive.   COORDINATION/GAIT:  Significant change in gait from prior visit.  He is walking with a rollator and has shuffling pattern, with wide-based stance.  He has difficulty with turns and legs appear to tremble.   Data: US carotids 12/30/2012: Findings suggest 1-39% internal carotid artery stenosis bilaterally. Vertebral arteries are patent with antegrade flow.   MRI/A brain 12/29/2012:  1. Ventricular prominence is disproportionate to the degree of atrophy, suggesting normal pressure hydrocephalus.  2. No other acute intracranial abnormality. Specifically, there is no evidence for acute or subacute infarct.  3. Mild distal small vessel disease is evident on the MRA without significant proximal stenosis, aneurysm, or branch vessel occlusion.   48-hr holter:  NSR, rare PVCs   CT head 06/04/2014: Ventricular prominence similar to prior MR suggesting normal pressure hydrocephalus. No intracranial hemorrhage. No CT evidence of large acute infarct. No intracranial mass or abnormal enhancement.  CTA head and neck 06/04/2014:   Calcified plaque with mild to slightly moderate narrowing cavernous segment internal carotid artery bilaterally. Mild narrowing and irregularity M1 segment left middle cerebral artery. Mild narrowing distal left vertebral artery.  MRI lumbar spine wo contrast 09/30/2014: 1. Minimal left foraminal narrowing at L4-5 and L5-S1 secondary to leftward disc protrusions and facet hypertrophy. 2. Leftward disc protrusion at L3-4 without significant stenosis.  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  Lab Results  Component Value Date   TSH 6.559 (H) 05/15/2017   Lab Results  Component Value Date   VITAMINB12 407 03/06/2017     IMPRESSION: 1.  Gait abnormality ?NPH.  Prior imaging shows enlarged ventricles, but he was asymptomatic until recently.  With his gait change, worsening dementia, and bladder incontinence, I will evaluate him for NPH with large volume tap and PT assessment. MRI brain wo contrast will be performed to be sure there is no other intracranial process causing these recent changes.   2.  Dementia, worsening.  Prior neuropsychological testing from 2018 showed mild dementia contributed by AD, history of alcohol use, and mood disorder. Repeat neuropsychological testing to assess for interval change He requires assistance with IADLs and has caregiver at home to assist.  I do not recommend that he drive due to cognitive deterioration and proprioceptive loss in the feet  3.  Alcoholic peripheral  neuropathy causing sensory ataxia.  Patient educated on daily foot inspection, fall prevention, and safety precautions around the home.  4.  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  Continue plavix 75mg  daily, statin therapy, and BP medication for secondary prevention.  Further recommendations pending results.  Greater than 50% of this 25 minute visit was spent in counseling, explanation of diagnosis, planning of further management, and coordination of care.   Thank you for allowing me to participate in patient's care.  If I can answer any additional questions, I would be pleased to do so.    Sincerely,     K. Posey Pronto, DO

## 2018-11-21 NOTE — Patient Instructions (Addendum)
MRI brain without contrast. My office will contact you to explain these results and determine when to get the spinal tap. We have sent a referral to [facility] for your MRI and they will call you directly to schedule your appointment. They are located at [place]. If you need to contact them directly please call [phone].  Formal neurocognitive testing

## 2018-11-25 DIAGNOSIS — R260 Ataxic gait: Secondary | ICD-10-CM | POA: Diagnosis not present

## 2018-11-25 DIAGNOSIS — R296 Repeated falls: Secondary | ICD-10-CM | POA: Diagnosis not present

## 2018-11-25 DIAGNOSIS — R2681 Unsteadiness on feet: Secondary | ICD-10-CM | POA: Diagnosis not present

## 2018-11-25 DIAGNOSIS — M6281 Muscle weakness (generalized): Secondary | ICD-10-CM | POA: Diagnosis not present

## 2018-11-28 DIAGNOSIS — R296 Repeated falls: Secondary | ICD-10-CM | POA: Diagnosis not present

## 2018-11-28 DIAGNOSIS — R260 Ataxic gait: Secondary | ICD-10-CM | POA: Diagnosis not present

## 2018-11-28 DIAGNOSIS — M6281 Muscle weakness (generalized): Secondary | ICD-10-CM | POA: Diagnosis not present

## 2018-11-28 DIAGNOSIS — R2681 Unsteadiness on feet: Secondary | ICD-10-CM | POA: Diagnosis not present

## 2018-12-08 ENCOUNTER — Ambulatory Visit: Payer: Self-pay | Admitting: Neurology

## 2018-12-08 ENCOUNTER — Telehealth: Payer: Self-pay | Admitting: Neurology

## 2018-12-08 NOTE — Telephone Encounter (Signed)
Patient [name] would like a letter stating that he can not drive any longer please

## 2018-12-08 NOTE — Telephone Encounter (Signed)
Can you dictate letter for this patient.

## 2018-12-09 ENCOUNTER — Encounter: Payer: Self-pay | Admitting: Neurology

## 2018-12-09 NOTE — Telephone Encounter (Signed)
Letter ready.

## 2018-12-09 NOTE — Telephone Encounter (Signed)
Letter mailed out 12/09/2018

## 2018-12-14 ENCOUNTER — Other Ambulatory Visit: Payer: Self-pay

## 2018-12-14 ENCOUNTER — Ambulatory Visit
Admission: RE | Admit: 2018-12-14 | Discharge: 2018-12-14 | Disposition: A | Discharge status: Home / Self Care | Payer: Medicare Other | Source: Ambulatory Visit | Attending: Neurology | Admitting: Neurology

## 2018-12-14 DIAGNOSIS — F0280 Dementia in other diseases classified elsewhere without behavioral disturbance: Secondary | ICD-10-CM

## 2018-12-15 ENCOUNTER — Other Ambulatory Visit: Payer: Self-pay

## 2018-12-15 DIAGNOSIS — G912 (Idiopathic) normal pressure hydrocephalus: Secondary | ICD-10-CM

## 2018-12-17 ENCOUNTER — Telehealth: Payer: Self-pay | Admitting: Neurology

## 2018-12-17 NOTE — Telephone Encounter (Signed)
Spoke with patients wife and questions already answered by scheduling.

## 2018-12-17 NOTE — Telephone Encounter (Signed)
Wife left a vm about having some questions regarding the patient's appt Friday morning for the lumbar puncture. She was wanting to know if he can eat before the appt and where is was located. She thought it was [facility] but she got a text saying [facility]. Thanks!

## 2018-12-18 ENCOUNTER — Other Ambulatory Visit: Payer: Self-pay | Admitting: Physician Assistant

## 2018-12-19 ENCOUNTER — Ambulatory Visit (HOSPITAL_COMMUNITY): Admission: RE | Admit: 2018-12-19 | Payer: Medicare Other | Source: Ambulatory Visit

## 2018-12-19 ENCOUNTER — Ambulatory Visit (HOSPITAL_COMMUNITY): Payer: Medicare Other

## 2018-12-22 DIAGNOSIS — Z1159 Encounter for screening for other viral diseases: Secondary | ICD-10-CM | POA: Diagnosis not present

## 2018-12-22 DIAGNOSIS — Z20828 Contact with and (suspected) exposure to other viral communicable diseases: Secondary | ICD-10-CM | POA: Diagnosis not present

## 2018-12-23 ENCOUNTER — Other Ambulatory Visit: Payer: Self-pay

## 2018-12-23 ENCOUNTER — Ambulatory Visit (HOSPITAL_COMMUNITY)
Admission: RE | Admit: 2018-12-23 | Discharge: 2018-12-23 | Disposition: A | Discharge status: Home / Self Care | Payer: Medicare Other | Source: Ambulatory Visit | Attending: Neurology | Admitting: Neurology

## 2018-12-23 ENCOUNTER — Ambulatory Visit (HOSPITAL_COMMUNITY): Payer: Medicare Other

## 2018-12-23 DIAGNOSIS — G912 (Idiopathic) normal pressure hydrocephalus: Secondary | ICD-10-CM | POA: Insufficient documentation

## 2018-12-23 LAB — CSF CELL COUNT WITH DIFFERENTIAL
RBC Count, CSF: 0 /mm3
Tube #: 3
WBC, CSF: 0 /mm3 (ref 0–5)

## 2018-12-23 LAB — GLUCOSE, CSF: Glucose, CSF: 56 mg/dL (ref 40–70)

## 2018-12-23 LAB — PROTEIN, CSF: Total  Protein, CSF: 44 mg/dL (ref 15–45)

## 2018-12-23 MED ORDER — LIDOCAINE HCL (PF) 1 % IJ SOLN
5.0000 mL | Freq: Once | INTRAMUSCULAR | Status: AC
  Administered 2018-12-23: 5 mL via INTRADERMAL

## 2018-12-23 NOTE — Discharge Instructions (Signed)
Lumbar Puncture, Care After °This sheet gives you information about how to care for yourself after your procedure. Your health care provider may also give you more specific instructions. If you have problems or questions, contact your health care provider. °What can I expect after the procedure? °After the procedure, it is common to have: °· Mild discomfort or pain at the puncture site. °· A mild headache that is relieved with pain medicines. °Follow these instructions at home: °Activity ° °· Lie down flat or rest for as long as directed by your health care provider. °· Return to your normal activities as told by your health care provider. Ask your health care provider what activities are safe for you. °· Avoid lifting anything heavier than 10 lb (4.5 kg) for at least 12 hours after the procedure. °· Do not drive for 24 hours if you were given a medicine to help you relax (sedative) during your procedure. °· Do not drive or use heavy machinery while taking prescription pain medicine. °Puncture site care °· Remove or change your bandage (dressing) as told by your health care provider. °· Check your puncture area every day for signs of infection. Check for: °? More pain. °? Redness or swelling. °? Fluid or blood leaking from the puncture site. °? Warmth. °? Pus or a bad smell. °General instructions °· Take over-the-counter and prescription medicines only as told by your health care provider. °· Drink enough fluids to keep your urine clear or pale yellow. Your health care provider may recommend drinking caffeine to prevent a headache. °· Keep all follow-up visits as told by your health care provider. This is important. °Contact a health care provider if: °· You have fever or chills. °· You have nausea or vomiting. °· You have a headache that lasts for more than 2 days or does not get better with medicine. °Get help right away if: °· You develop any of the following in your  legs: °? Weakness. °? Numbness. °? Tingling. °· You are unable to control when you urinate or have a bowel movement (incontinence). °· You have signs of infection around your puncture site, such as: °? More pain. °? Redness or swelling. °? Fluid or blood leakage. °? Warmth. °? Pus or a bad smell. °· You are dizzy or you feel like you might faint. °· You have a severe headache, especially when you sit or stand. °Summary °· A lumbar puncture is a procedure in which a small needle is inserted into the lower back to remove fluid that surrounds the brain and spinal cord. °· After this procedure, it is common to have a headache and pain around the needle insertion area. °· Lying flat, staying hydrated, and drinking caffeine can help prevent headaches. °· Monitor your needle insertion site for signs of infection, including warmth, fluid, or more pain. °· Get help right away if you develop leg weakness, leg numbness, incontinence, or severe headaches. °This information is not intended to replace advice given to you by your health care provider. Make sure you discuss any questions you have with your health care provider. °Document Released: 12/23/2012 Document Revised: 02/01/2016 Document Reviewed: 02/01/2016 °Elsevier Patient Education © 2020 [employer] ° °

## 2018-12-23 NOTE — Progress Notes (Signed)
Instructions called to wife. Voices understanding of all instructions.  Wife to pick up at 1530.

## 2018-12-23 NOTE — Procedures (Signed)
Diagnostic and therapeutic lumbar puncture performed at L2/3. 30 mL CSF removed without complication. Details dictated in Radiology report.

## 2018-12-29 ENCOUNTER — Other Ambulatory Visit: Payer: Self-pay | Admitting: Neurology

## 2018-12-30 ENCOUNTER — Telehealth: Payer: Self-pay | Admitting: Neurology

## 2018-12-30 NOTE — Telephone Encounter (Signed)
Wife is calling in about the procedure patient recently had regarding a shunt. She was wanting to speak to a nurse regarding this. Ask questions about how this will help him/ what changes can she be looking for. Thanks!

## 2018-12-30 NOTE — Telephone Encounter (Signed)
Please inform wife that the test was performed to see if fluid removal improved his walking which is why he was evaluated by PT before and after the procedure.  Their notes indicate that his balance actually was worse after the lumbar puncture, making NPH (normal pressure hydrocephalus - excess fluid) to be less likely.  She does not need to monitor for any changes.  Please set up VV on 1/7 or 1/14 to discuss further.  Thanks.

## 2018-12-30 NOTE — Telephone Encounter (Signed)
Left message for patient wife to call back to discuss results and set up appointment.

## 2018-12-30 NOTE — Telephone Encounter (Signed)
Patient returned call and is now scheduled for 01/08/2019 at 10:50 AM with Dr. Posey Pronto to discuss results.

## 2019-01-05 DIAGNOSIS — Z1159 Encounter for screening for other viral diseases: Secondary | ICD-10-CM | POA: Diagnosis not present

## 2019-01-05 DIAGNOSIS — Z20828 Contact with and (suspected) exposure to other viral communicable diseases: Secondary | ICD-10-CM | POA: Diagnosis not present

## 2019-01-07 ENCOUNTER — Telehealth: Payer: Self-pay

## 2019-01-07 NOTE — Telephone Encounter (Signed)
Spoke with pt to up date medication he stated I needed to talk to his wife, tried to call her no answer voice mail left for her to call back.

## 2019-01-08 ENCOUNTER — Telehealth: Payer: Self-pay

## 2019-01-08 ENCOUNTER — Other Ambulatory Visit: Payer: Self-pay

## 2019-01-08 ENCOUNTER — Telehealth (INDEPENDENT_AMBULATORY_CARE_PROVIDER_SITE_OTHER): Payer: Medicare Other | Admitting: Neurology

## 2019-01-08 ENCOUNTER — Encounter: Payer: Self-pay | Admitting: Neurology

## 2019-01-08 VITALS — Ht 74.0 in | Wt 169.0 lb

## 2019-01-08 DIAGNOSIS — R270 Ataxia, unspecified: Secondary | ICD-10-CM | POA: Diagnosis not present

## 2019-01-08 DIAGNOSIS — I69392 Facial weakness following cerebral infarction: Secondary | ICD-10-CM

## 2019-01-08 DIAGNOSIS — F0280 Dementia in other diseases classified elsewhere without behavioral disturbance: Secondary | ICD-10-CM

## 2019-01-08 DIAGNOSIS — Z712 Person consulting for explanation of examination or test findings: Secondary | ICD-10-CM

## 2019-01-08 DIAGNOSIS — Z20828 Contact with and (suspected) exposure to other viral communicable diseases: Secondary | ICD-10-CM | POA: Diagnosis not present

## 2019-01-08 DIAGNOSIS — F0390 Unspecified dementia without behavioral disturbance: Secondary | ICD-10-CM | POA: Diagnosis not present

## 2019-01-08 DIAGNOSIS — G621 Alcoholic polyneuropathy: Secondary | ICD-10-CM

## 2019-01-08 DIAGNOSIS — Z7902 Long term (current) use of antithrombotics/antiplatelets: Secondary | ICD-10-CM

## 2019-01-08 DIAGNOSIS — R269 Unspecified abnormalities of gait and mobility: Secondary | ICD-10-CM

## 2019-01-08 DIAGNOSIS — Z1159 Encounter for screening for other viral diseases: Secondary | ICD-10-CM | POA: Diagnosis not present

## 2019-01-08 DIAGNOSIS — G20 Parkinson's disease: Secondary | ICD-10-CM

## 2019-01-08 MED ORDER — CARBIDOPA-LEVODOPA 25-100 MG PO TABS: ORAL_TABLET | ORAL | 2 refills | Status: DC

## 2019-01-08 NOTE — Progress Notes (Signed)
Virtual Visit via Video Note The purpose of this virtual visit is to provide medical care while limiting exposure to the novel coronavirus.    Consent was obtained for video visit:  Yes.   Answered questions that patient had about telehealth interaction:  Yes.   I discussed the limitations, risks, security and privacy concerns of performing an evaluation and management service by telemedicine. I also discussed with the patient that there may be a patient responsible charge related to this service. The patient expressed understanding and agreed to proceed.  Pt location: Home Physician Location: office Name of referring provider:  Seward Carol, MD I connected with Dylan Moreno at patients initiation/request on 01/08/2019 at 10:50 AM EST by video enabled telemedicine application and verified that I am speaking with the correct person using two identifiers. Pt MRN:  [id number] Pt DOB:  1940-01-26 Video Participants:  Dylan Moreno;  Wife Dylan Moreno), business partner Dylan Moreno)   History of Present Illness: This is a 79 y.o. male returning for follow-up of to discuss the results of his lumbar puncture testing for gait abnormalities.  He underwent large-volume lumbar puncture with physical therapy assessment which did not find evidence of normal pressure hydrocephalus. He continues to have gait instability, shuffling pattern, and is dependent on using his walker.  He also has mild tremor of the hands.     Observations/Objective:   Vitals:   01/08/19 0953  Weight: 169 lb (76.7 kg)  Height: 6\' 2"  (1.88 m)   Patient is awake, alert, and appears comfortable.  Easily engages in conversation and answers questions appropriately.  No ptosis.  Face is symmetric.  Speech is not dysarthric.  Antigravity in all extremities.  No pronator drift.  Bilateral resting hand tremor.  Gait appears stooped, shuffling, and assisted with a walker   Assessment and Plan:  1.  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  Subtle parkinsonian features of resting tremor and gait changes warrants trial of sinemet.  Start sinemet 25/100 half tab twice daily x 1 week, then increase to 1 tab BID.  Side effects discussed Fall precautions discussed, always use walker.  2.  Dementia without behavior change, worsening.  He is able to perform basic ADLs such as bathing, dressing, and feeding.  He does require assistance with medication hand finance management.  I have advised him not to drive and letter was provided, per wife's request.  Prior neuropsychological testing from 2018 showed mild dementia contributed by Alzheimer's disease, history of alcohol use, and mood disorder.  He is scheduled to have repeat neuropsychological testing later this month.   MRI brain was independently reviewed from December 2020 which shows ventriculomegaly.  He did not have a large-volume lumbar puncture with physical therapy assessment, which did not support the diagnosis of normal pressure hydrocephalus.    3.  Alcoholic peripheral neuropathy with sensory ataxia.  He was advised to try and cut back on alcohol consumption, which he reports he has. Patient educated on daily foot inspection, fall prevention, and safety precautions around the home.  4.  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  Continue plavix 75mg  daily, statin therapy, and BP medication for secondary prevention.   Follow Up Instructions:   I discussed the assessment and treatment plan with the patient. The patient was provided an opportunity to ask questions and all were answered. The patient agreed with the plan and demonstrated an understanding of the instructions.   The patient was advised to call  back or seek an in-person evaluation if the symptoms worsen or if the condition fails to improve as anticipated.  Follow-up in 3 months   Alda Berthold, DO

## 2019-01-08 NOTE — Telephone Encounter (Signed)
Pt wife called no answer voice mail left for her to call back to go over medication

## 2019-01-12 DIAGNOSIS — Z20828 Contact with and (suspected) exposure to other viral communicable diseases: Secondary | ICD-10-CM | POA: Diagnosis not present

## 2019-01-12 DIAGNOSIS — Z1159 Encounter for screening for other viral diseases: Secondary | ICD-10-CM | POA: Diagnosis not present

## 2019-01-15 DIAGNOSIS — Z1159 Encounter for screening for other viral diseases: Secondary | ICD-10-CM | POA: Diagnosis not present

## 2019-01-15 DIAGNOSIS — Z20828 Contact with and (suspected) exposure to other viral communicable diseases: Secondary | ICD-10-CM | POA: Diagnosis not present

## 2019-01-19 DIAGNOSIS — N401 Enlarged prostate with lower urinary tract symptoms: Secondary | ICD-10-CM | POA: Diagnosis not present

## 2019-01-19 DIAGNOSIS — E039 Hypothyroidism, unspecified: Secondary | ICD-10-CM | POA: Diagnosis not present

## 2019-01-19 DIAGNOSIS — E781 Pure hyperglyceridemia: Secondary | ICD-10-CM | POA: Diagnosis not present

## 2019-01-19 DIAGNOSIS — E7800 Pure hypercholesterolemia, unspecified: Secondary | ICD-10-CM | POA: Diagnosis not present

## 2019-01-19 DIAGNOSIS — E782 Mixed hyperlipidemia: Secondary | ICD-10-CM | POA: Diagnosis not present

## 2019-01-19 DIAGNOSIS — Z1159 Encounter for screening for other viral diseases: Secondary | ICD-10-CM | POA: Diagnosis not present

## 2019-01-19 DIAGNOSIS — I10 Essential (primary) hypertension: Secondary | ICD-10-CM | POA: Diagnosis not present

## 2019-01-19 DIAGNOSIS — Z20828 Contact with and (suspected) exposure to other viral communicable diseases: Secondary | ICD-10-CM | POA: Diagnosis not present

## 2019-01-19 DIAGNOSIS — G459 Transient cerebral ischemic attack, unspecified: Secondary | ICD-10-CM | POA: Diagnosis not present

## 2019-01-19 DIAGNOSIS — F0390 Unspecified dementia without behavioral disturbance: Secondary | ICD-10-CM | POA: Diagnosis not present

## 2019-01-20 ENCOUNTER — Telehealth: Payer: Self-pay | Admitting: Neurology

## 2019-01-20 DIAGNOSIS — G20 Parkinson's disease: Secondary | ICD-10-CM

## 2019-01-20 DIAGNOSIS — G621 Alcoholic polyneuropathy: Secondary | ICD-10-CM

## 2019-01-20 DIAGNOSIS — R269 Unspecified abnormalities of gait and mobility: Secondary | ICD-10-CM

## 2019-01-20 NOTE — Telephone Encounter (Signed)
Davis from [employer] called regarding patient's wife calling to request more visits? They are needing a new order faxed over for him to re start PT. The fax number is [phone]. Thank you

## 2019-01-21 ENCOUNTER — Encounter: Payer: Self-pay | Admitting: Psychology

## 2019-01-21 ENCOUNTER — Other Ambulatory Visit: Payer: Self-pay

## 2019-01-21 ENCOUNTER — Ambulatory Visit (INDEPENDENT_AMBULATORY_CARE_PROVIDER_SITE_OTHER): Payer: Medicare Other | Admitting: Psychology

## 2019-01-21 ENCOUNTER — Ambulatory Visit: Payer: Medicare Other | Admitting: Psychology

## 2019-01-21 DIAGNOSIS — F1010 Alcohol abuse, uncomplicated: Secondary | ICD-10-CM | POA: Diagnosis not present

## 2019-01-21 DIAGNOSIS — R413 Other amnesia: Secondary | ICD-10-CM

## 2019-01-21 DIAGNOSIS — F0150 Vascular dementia without behavioral disturbance: Secondary | ICD-10-CM | POA: Diagnosis not present

## 2019-01-21 DIAGNOSIS — F0390 Unspecified dementia without behavioral disturbance: Secondary | ICD-10-CM

## 2019-01-21 NOTE — Telephone Encounter (Signed)
Please advise on below  

## 2019-01-21 NOTE — Progress Notes (Signed)
   Psychometrician Note   Dylan Moreno completed 100 minutes of neuropsychological testing with technician, Milana Kidney, B.S., under the supervision of Dr. Christia Reading, Ph.D., licensed psychologist. The patient did not appear overtly distressed by the testing session, per behavioral observation or via self-report to the technician. Rest breaks were offered.    In considering the patient's current level of functioning, level of presumed impairment, nature of symptoms, emotional and behavioral responses during the interview, level of literacy, and observed level of motivation/effort, a battery of tests was selected and communicated to the psychometrician.   Communication between the psychologist and technician was ongoing throughout the testing session and changes were made as deemed necessary based on patient performance on testing, technician observations and additional pertinent factors such as those listed above.   Dylan Moreno will return within approximately two weeks for an interactive feedback session with Dr. Melvyn Novas at which time his test performances, clinical impressions, and treatment recommendations will be reviewed in detail. The patient understands he can contact our office should he require our assistance before this time.  100 minutes were spent face-to-face with patient administering standardized tests, 30 minutes were spent scoring by the technician, and another 15 minutes were spent reviewing scored testing protocols by Dr. Melvyn Novas. [CPT [id number], [id number]  This note reflects time spent with the psychometrician and does not include test scores or any clinical interpretations made by Dr. Melvyn Novas. The full report will follow in a separate note.

## 2019-01-21 NOTE — Telephone Encounter (Signed)
Please fax PT order for gait training. Thanks.

## 2019-01-21 NOTE — Telephone Encounter (Signed)
faxed

## 2019-01-21 NOTE — Progress Notes (Addendum)
NEUROPSYCHOLOGICAL EVALUATION [place]. [place] Department of Neurology  Reason for Referral:   SEENA FACE is a 79 y.o. Caucasian male referred by Narda Amber, D.O., to characterize his current cognitive functioning and assist with diagnostic clarity and treatment planning in the context of subjective cognitive dysfunction, prior diagnosis of a mild dementia concerning for Alzheimer's disease, and potential cognitive decline.  Assessment and Plan:   Clinical Impression(s): Mr. Mcparland pattern of performance is suggestive of profound impairments surrounding retrieval and consolidation aspects of both verbal and visual memory. Additional weaknesses were seen across response inhibition and phonemic fluency, with further performance variability across cognitive flexibility and visuospatial functioning. Relative to his prior evaluation in 2018, mild performance declines were seen across retrieval/consolidation aspects of memory, as well as a task assessing motor-based spatial manipulation. Improvements were noted across semantic fluency and a single task assessing visuomotor cognitive flexibility. Other domains exhibited relatively stable performances.   Regarding activities of daily living (ADLs), Mr. Hubers wife acknowledged ongoing difficulties completing day-to-day activities independently, including him requiring assistance in managing medications and personal finances. He also has been told previously by Dr. Posey Pronto to stop driving due to safety concerns. This, coupled with evidence for significant cognitive dysfunction in cognitive domains described above, suggests that he continues to meet criteria for a Major Neurocognitive Disorder (formerly "dementia") at the present time.  The etiology of Mr. Whichard's clinical presentation is likely multifactorial in nature. Amnestic performances with minimal to no evidence for information consolidation is consistent with  and certainly concerning for Alzheimer's disease. However, his stability across confrontation naming and improvement across semantic fluency tests are inconsistent with what would typically be seen in this condition. Alzheimer's disease alone would also not explain his pronounced gait abnormalities and mild tremors. Recent neuroimaging revealed increased ventriculomegaly, suggestive of normal pressure hydrocephalus. Gait abnormalities, balance concerns, and prior report of incontinence are consistent with this presentation. However, personality changes were not noted, insight to his cognitive deficits was poor, and cognitive deficits appear more advanced than what would be expected. Additionally, recent large volume lumbar puncture was not said to improve his presentation. Chronic alcohol abuse can also create cognitive difficulties and risk development of an alcohol-related dementia. However, there is no mention of a prior thiamine deficiency or episode of Wernicke's encephalopathy, and memory deficits appear more pronounced than what would be expected from an alcohol-related dementia alone. Continued medical monitoring will be important moving forward.    Recommendations: A repeat neuropsychological evaluation in 12-18 months (or sooner if functional decline is noted) is recommended to assess the trajectory of future cognitive decline should it occur. This will also aid in future efforts towards improved diagnostic clarity.  Mr. 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 Somero noted pouring himself 2-3oz each day, but not always consuming it all. Reducing alcohol intake is highly recommended in order to reduce the risk of future cognitive decline, as well as falls or other bodily injury.  Mr. Mclean wife expressed a desire for additional PT services, noting that she needed to request these additional sessions. If not already done, I  think that continued PT services to improve Mr. Morganti's gait and lower-body strength would be a good idea.   It will be important for Mr. Lininger to have another person with him when in situations where he may need to process information, weigh the pros and cons of different  options, and make decisions, in order to ensure that he fully understands and recalls all information to be considered.  Important information should be provided in written format in all instances. This should be placed in highly visible and often frequented locations around his home.  To address problems with fluctuating attention, he may wish to consider:   -Avoiding external distractions when needing to concentrate   -Limiting exposure to fast paced environments with multiple sensory demands   -Writing down complicated information and using checklists   -Attempting and completing one task at a time (i.e., no multi-tasking)   -Verbalizing aloud each step of a task to maintain focus   -Reducing the amount of information considered at one time  Review of Records:   Mr. Bisping completed a comprehensive neuropsychological evaluation Kandis Nab, Psy.D.) on 02/16/2016. Results of this evaluation demonstrated significant deficits in memory, as well as mild impairment in semantic retrieval. These results demonstrate appreciable decline in memory function relative to his previous evaluation with Dr. Valentina Shaggy in 2016. He did demonstrate improvement on tasks of inhibitory control, attentional set shifting, set maintenance and mental flexibility, which suggests that Adderall is helping with these areas of functioning. In addition to memory impairments on testing, Ms. Kiraly exhibited functional decline with regard to work and instrumental ADLs. As such, diagnostic criteria for a dementia syndrome are met. His cognitive profile was said to be concerning for Alzheimer's disease. However, given his history of daily alcohol use,  an alcohol related dementia remained on the differential.  Mr. Boody was seen by [facility] Narda Amber, D.O.) on 01/08/2019 for follow-up to discuss the results of a recent lumbar puncture. Large-volume lumbar puncture was said to not support a diagnosis of normal pressure hydrocephalus. 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 Per a note by Dr. Posey Pronto on 11/21/2018, intermittent incontinence was also reported, ongoing for the past 3 months. He also reported a mild tremor in his hands. Regarding ADLs, Mr. Campanelli is able to complete basic ADLs independently. However, he does require assistance with medication and financial management. Dr. Posey Pronto also advised him to no longer drive.   Brain MRI on 12/30/2012 suggested that ventriclar prominence is disproportionate to the degree of atrophy, suggesting normal pressure hydrocephalus. Brain MRI on 12/15/2018 revealed diffuse ventricular prominence out of proportion for degree of cortical sulcation, suggesting normal pressure hydrocephalus. Ventriculomegaly had increased compared to his 2014 MRI.  Past Medical History:  Diagnosis Date  . Acute delirium 06/02/2014  . ADHD (attention deficit hyperactivity disorder)   . Alcohol abuse 06/08/2014  . Arthritis   . 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 Cerebral ventriculomegaly 06/08/2014  . Dysphagia 06/17/2014  . Dysuria 07/15/2014  . GERD (gastroesophageal reflux disease)   . History of radiation therapy 04/06/14- 05/25/14   Base of tongue and bilateral neck 70 Gy in 35 fractions to gross disease, 63 Gy in 35 Fractions to high risk nodal echelons, and 56 Gy in 35 fractions to intermediate risk nodal echelons  . Hypercholesterolemia   . Hyperlipemia   . Hypertension   . Hyponatremia   . Kidney stones   . Leukopenia due to antineoplastic chemotherapy  04/28/2014  . 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  . Mild cognitive impairment with memory loss 12/22/2014  . 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 Peyronie's disease   .  Subacute delirium 05/25/2014  . TIA (transient ischemic attack) 12/29/12   left facial droop    Past Surgical History:  Procedure Laterality Date  . COLONOSCOPY    . CYSTOSCOPY W/ URETEROSCOPY W/ LITHOTRIPSY  2011  . 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   Procedure: DUPUYTREN CONTRACTURE RELEASE RIGHT PALM,RING AND SMALL FINGER;  Surgeon: Cammie Sickle., MD;  Location: [facility];  Service: Orthopedics;  Laterality: Right;  . HAND SURGERY     right  . SHOULDER ARTHROSCOPY W/ ROTATOR CUFF REPAIR     left  . TONSILLECTOMY      Family History  Problem Relation Age of Onset  . Heart attack Mother        Deceased, 78  . Alcohol abuse Mother   . Hypothyroidism Father        Deceased, 60  . Melanoma Brother   . Alcohol abuse Brother   . Healthy Son   . Cancer Paternal Uncle        ?lung cancer     Current Outpatient Medications:  .  amphetamine-dextroamphetamine (ADDERALL XR) 5 MG 24 hr capsule, Take 5 mg by mouth daily., Disp: , Rfl:  .  atorvastatin (LIPITOR) 40 MG tablet, Take 40 mg by mouth daily., Disp: , Rfl:  .  carbidopa-levodopa (SINEMET IR) 25-100 MG tablet, Take half tablet 30-min prior to breakfast and dinner for one week, then increase to 1 tablet 30-min prior to breakfast and dinner thereafter., Disp: 60 tablet, Rfl: 2 .  clopidogrel (PLAVIX) 75 MG tablet, Place 1 tablet (75 mg total) into feeding tube daily. (Patient taking differently: Take 75 mg by mouth daily. ), Disp: 30 tablet, Rfl: 0 .  donepezil (ARICEPT) 10 MG tablet, TAKE 1 TABLET BY MOUTH EVERY DAY, Disp: 90 tablet, Rfl: 3 .  levothyroxine (SYNTHROID, LEVOTHROID) 112 MCG tablet, Take 112 mcg by mouth daily., Disp: , Rfl: 3 .  losartan  (COZAAR) 25 MG tablet, Take 25 mg by mouth daily., Disp: , Rfl:  .  tamsulosin (FLOMAX) 0.4 MG CAPS capsule, Take 0.4 mg by mouth daily., Disp: , Rfl: 3 .  thiamine 250 MG tablet, Take 250 mg by mouth daily. , Disp: , Rfl:   Clinical Interview:   Cognitive Symptoms: Decreased short-term memory: Denied. However, his wife reported Mr. Musil having trouble remembering the details of conversations and asking repetitive questions. She also noted that he seemed to not remember previous falls, as well as trouble operating his television remote control. Memory difficulties were said to be present for the past few years, but have appeared more pronounced over the past year.  Decreased long-term memory: Denied. Decreased attention/concentration: Denied. Reduced processing speed: Denied. Difficulties with executive functions: Endorsed. Mr. Cardell reported that he has "never been that organized." His wife noted that he used to display excellent organizational skills, but that these abilities have declined over recent years. She also noted personality changes in that Mr. Brundidge appears more easily frustrated or irritable. Difficulties with indecisiveness, impulsivity, or using good judgment were denied.  Difficulties with emotion regulation: Denied. Difficulties with receptive language: Denied. Difficulties with word finding: Denied. Decreased visuoperceptual ability: Denied.  Difficulties completing ADLs: Endorsed. Mr. Moist currently resides in the [facility]. His wife reported that she and the nurses at [facility] work together to organize and ensure that Mr. Gastineau is taking his medications as intended. His wife also manages their personal finances. He  has not been driving since Dr. Posey Pronto advised him not to do so given ongoing safety concerns.   Additional Medical History: History of traumatic brain injury/concussion: Denied. History of stroke: Denied. However, records suggest  the presence of a cryptogenic TIA manifesting with a left facial droop, occurring on 12/29/2012.  History of seizure activity: Denied. History of known exposure to toxins: Denied. Symptoms of chronic pain: Denied. Experience of frequent headaches/migraines: Denied. Frequent instances of dizziness/vertigo: Denied.  Sensory changes: Mr. Siefring wears glasses with positive effect. He denied hearing difficulties despite medical records suggesting mild to moderate hearing loss. Other sensory changes/difficulties (e.g., taste or smell) were denied.  Balance/coordination difficulties: Endorsed. He reported pronounced balance difficulties and is largely reliant on his walker for ambulation. He noted ongoing balance instability, as well as weakness in his legs. His wife also noted tremulous behaviors in his legs at times. Per Dr. 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 His wife reported a history of several falls, with none leading to significant injury. Mr. Eschbach appeared largely amnestic to these falls.  Other motor difficulties: Endorsed. Mild tremulous behavior has said to involve both his upper and lower extremities (lower worse than upper).  Sleep History: Estimated hours obtained each night: 8 hours. Difficulties falling asleep: Denied. Difficulties staying asleep: Denied. Feels rested and refreshed upon awakening: Endorsed.  History of snoring: Endorsed. History of waking up gasping for air: Denied. Witnessed breath cessation while asleep: Denied.  History of vivid dreaming: Denied. Excessive movement while asleep: Denied. Instances of acting out his dreams: Denied.  Psychiatric/Behavioral Health History: Depression: Denied. Mr. Ratterman described his current mood as "good" and denied a history of depression or other mental health concerns. His wife did report mild levels of frustration given him being less active and perhaps a bit more  isolated given the ongoing COVID-19 pandemic. Mr. Kibbe did acknowledge feeling "less content" in this regard. Current or remote suicidal ideation, intent, or plan was denied. Anxiety: Denied. Mania: Denied. Trauma History: Denied. Visual/auditory hallucinations: Denied. Delusional thoughts: Denied.  Tobacco: Denied. Mr. Fontenot reported quitting in the past. Alcohol: He reported consuming on average 2-3oz of scotch each day. This was said to have decreased from 4-5oz per day. His wife expressed concerns surrounding his alcohol intake, stating that his business partner will bring him around 1/2 gallon of scotch every 5 days. Medical records suggest a history of alcohol abuse. Mr. Earwood denied his alcohol intake ever leading to psychosocial or legal difficulties, nor the need for formalized treatment. Recreational drugs: Denied. He reported a remote history of frequent marijuana usage.  Caffeine: 1 cup of coffee in the morning.   Academic/Vocational History: Highest level of educational attainment: 12 years. Mr. Moone graduated from high school and completed an additional 6 months of college. 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 Foreign language was noted as a relative weakness. History of developmental delay: Denied. History of grade repetition: Denied. Enrollment in special education courses: Denied. History of diagnosed specific learning disability: Denied. History of ADHD: Endorsed. Per his previous neuropsychological report, Mr. Terral has a lifelong history of ADHD symptoms and was formally diagnosed by a psychiatrist about 15-20 years ago. He has been treated with Adderall since that time.  Employment: Retired. 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   Evaluation Results:   Behavioral Observations: Mr. Lortie was accompanied by his wife, arrived to  his appointment on time, and  was appropriately dressed and groomed. He relied on a rolling walker to ambulate and was initially pushed by his wife. However, when walking to the examination room, he exhibited a slowed, unsteady, shuffling gait. Very mild tremulous behaviors were noted in his upper extremities; however, this was only seen after Mr. Roznowski held out his hands to demonstrate these symptoms. His affect was generally relaxed and positive, but did range appropriately given the subject being discussed during the clinical interview or the task at hand during testing procedures. Spontaneous speech was fluent and word finding difficulties were not observed during the clinical interview or testing procedures. Sustained attention was appropriate throughout. Thought processes were coherent, organized, and normal in content. Task engagement was adequate. Persistence was variable and poorer surrounding memory measures. Overall, Mr. Ruark was cooperative with the clinical interview and subsequent testing procedures.   Adequacy of Effort: The validity of neuropsychological testing is limited by the extent to which the individual being tested may be assumed to have exerted adequate effort during testing. Mr. Merfeld expressed his intention to perform to the best of his abilities and exhibited adequate task engagement and persistence. Scores across stand-alone and embedded performance validity measures were variable. However, his below expectation score came on a memory measure and likely reflects true cognitive dysfunction rather than poor engagement of attempts to perform poorly. As such, the results of the current evaluation are believed to be a valid representation of Mr. Macquarrie's current cognitive functioning.  Test Results: Mr. Dray was disoriented at the time of the current evaluation. He stated his incorrect age ("22"). He also was unable to state the current year ("2018"), month ("December"), date, day of the week, or current  location ("[place]").  Intellectual abilities based upon educational and vocational attainment were estimated to be in the average range. Premorbid abilities were estimated to be within the average range based upon a single-word reading test.   Processing speed was below average to average. Basic attention was well above average. More complex attention (e.g., working memory) was average. Executive functioning was variable. Response inhibition represented a weakness, while cognitive flexibility ranged from the well below average to average ranges. Abstract reasoning was within normal limits.  Assessed receptive language abilities were below average. Mr. Tiegs did not exhibit any difficulties comprehending task instructions, but did struggle with complex instructions with increasing verbal working memory demands. Assessed expressive language (e.g., verbal fluency and confrontation naming) was variable. Phonemic fluency was below average and semantic fluency was below average to average. Confrontation naming was within normal limits.   Assessed visuospatial/visuoconstructional abilities were below average to average.    Learning (i.e., encoding) of novel verbal and visual information was below average to average. Spontaneous delayed recall (i.e., retrieval) of previously learned information was exceptionally low and Mr. Axon was wholly amnestic across all 3 memory measures. Performance across recognition tasks was likewise exceptionally low, suggesting no evidence for information consolidation.   Results of emotional screening instruments suggested that recent symptoms of generalized anxiety were in the minimal range, while symptoms of depression were within normal limits. A screening instrument assessing recent sleep quality suggested the presence of minimal sleep dysfunction.  Tables of Scores:   Note: This summary of test scores accompanies the interpretive report and should not be considered  in isolation without reference to the appropriate sections in the text. Descriptors are based on appropriate normative data and may be adjusted based on clinical judgment. The terms "impaired" and "  within normal limits (WNL)" are used when a more specific level of functioning cannot be determined. Descriptors refer to the current evaluation only.        Effort Testing:    [facility]   February 2018 Current    Dot Counting Test: --- --- --- Within Expectation  WAIS-IV Reliable Digit Span: --- --- --- Within Expectation  CVLT-III Forced Choice Recognition: --- --- --- Below Expectation        Orientation:       Raw Score Raw Score Percentile   NAB Orientation, Form 1 --- 19/29 --- ---        Intellectual Functioning:             Standard Score Standard Score Percentile   Test of Premorbid Functioning: 96 97 42 Average        Memory:            Wechsler Memory Scale (WMS-IV):                       Raw Score (Scaled Score) Raw Score (Scaled Score) Percentile     Logical Memory I 19/53 (6) 20/53 (7) 16 Below Average    Logical Memory II 1/39 (2) 0/39 (1) <1 Exceptionally Low    Logical Memory Recognition 15/23 10/23 <2 Exceptionally Low        [place] Verbal Learning Test (CVLT-III) Brief Form: Raw Score Raw Score (Scaled/Standard Score) Percentile     Total Trials 1-4 28/36 23/36 (89) 23 Below Average    Short-Delay Free Recall 3/9 0/9 (1) <1 Exceptionally Low    Long-Delay Free Recall 0/9 0/9 (1) <1 Exceptionally Low    Long-Delay Cued Recall 0/9 0/9 (1) <1 Exceptionally Low      Recognition Hits 7/9 1/9 (1) <1 Exceptionally Low      False Positive Errors 2 1 (9) 37 Average         Raw Score Raw Score Percentile   RBANS Figure Copy: 20/20 17/20 37 Average  RBANS Figure Recall: 0/20 0/20 <1 Exceptionally Low        Attention/Executive Function:            Trail Making Test (TMT): Raw Score Raw Score (T Score) Percentile     Part A 49 secs.,  0 errors 49 secs., 0 errors  (43) 25 Average    Part B 145 secs.,  3 errors 102 secs.,  0 errors (53) 62 Average         Scaled Score Scaled Score Percentile   WAIS-IV Coding: 7 6 9  Below Average         Scaled Score Scaled Score Percentile   WAIS-IV Digit Span: --- 10 50 Average    Forward 17 15 95 Well Above Average    Backward 10 8 25  Average    Sequencing --- 8 25 Average         Scaled Score Scaled Score Percentile   WAIS-IV Similarities: 9 9 37 Average        D-KEFS Color-Word Interference Test: Raw Score Raw Score (Scaled Score) Percentile     Color Naming --- 41 secs. (7) 16 Below Average    Word Reading --- 27 secs. (9) 37 Average    Inhibition --- 96 secs. (6) 9 Below Average      Total Errors --- 6 errors (7) 16 Below Average      Inhibition/Switching --- 160 secs. (1) <1 Exceptionally Low  Total Errors --- 21 errors (1) <1 Exceptionally Low        D-KEFS Verbal Fluency Test: Raw Score Raw Score (Scaled Score) Percentile     Letter Total Correct --- 20 (6) 9 Below Average    Category Total Correct --- 26 (7) 16 Below Average    Category Switching Total Correct --- 8 (5) 5 Well Below Average    Category Switching Accuracy --- 7 (6) 9 Below Average      Total Set Loss Errors --- 1 (11) 63 Average      Total Repetition Errors --- 3 (10) 50 Average        Language:            Verbal Fluency Test: Raw Score Raw Score (Z-Score) Percentile     Phonemic Fluency (FAS) 28 20 (-1.25) 11 Below Average    Animal Fluency 6 16 (-0.09) 47 Average  *Based on [employer] Older Normative Studies (MOANS)            NAB Language Module, Form 1: T Score T Score Percentile     Auditory Comprehension --- 40 16 Below Average    Naming --- 31/31 (60) 84 Above Average        Visuospatial/Visuoconstruction:       Raw Score Raw Score Percentile   Clock Drawing: WNL 9/10 --- Within Normal Limits        NAB Spatial Module, Form 1: T Score T Score Percentile     Visual Discrimination --- 37 9 Below Average          Scaled Score Scaled Score Percentile   WAIS-IV Block Design: 9 6 9  Below Average  WAIS-IV Matrix Reasoning: 8 8 25  Average        Mood and Personality:       Raw Score Raw Score Percentile   Geriatric Depression Scale: --- 0 --- Within Normal Limits  Geriatric Anxiety Scale: --- 8 --- Minimal    Somatic --- 5 --- Minimal    Cognitive --- 2 --- Minimal    Affective --- 1 --- Minimal        Additional Questionnaires:       Raw Score Raw Score Percentile   PROMIS Sleep Disturbance Questionnaire: --- 12 --- None to Slight   Informed Consent and Coding/Compliance:   Mr. Vana was provided with a verbal description of the nature and purpose of the present neuropsychological evaluation. Also reviewed were the foreseeable risks and/or discomforts and benefits of the procedure, limits of confidentiality, and mandatory reporting requirements of this provider. The patient was given the opportunity to ask questions and receive answers about the evaluation. Oral consent to participate was provided by the patient.   This evaluation was conducted by Christia Reading, Ph.D., licensed clinical neuropsychologist. Mr. Matsuo completed a 30-minute clinical interview, billed as one unit [phone], and 145 minutes of cognitive testing and scoring, billed as one unit [phone] and four additional units [phone]. Psychometrist Milana Kidney, B.S., assisted Dr. Melvyn Novas with test administration and scoring procedures. As a separate and discrete service, Dr. Melvyn Novas spent a total of 180 minutes in interpretation and report writing billed as one unit [phone] and three units [phone].

## 2019-01-22 ENCOUNTER — Encounter: Payer: Self-pay | Admitting: Psychology

## 2019-01-22 DIAGNOSIS — G20 Parkinson's disease: Secondary | ICD-10-CM | POA: Diagnosis not present

## 2019-01-22 DIAGNOSIS — F0390 Unspecified dementia without behavioral disturbance: Secondary | ICD-10-CM | POA: Insufficient documentation

## 2019-01-22 DIAGNOSIS — F0150 Vascular dementia without behavioral disturbance: Secondary | ICD-10-CM

## 2019-01-22 DIAGNOSIS — R260 Ataxic gait: Secondary | ICD-10-CM | POA: Diagnosis not present

## 2019-01-22 DIAGNOSIS — R278 Other lack of coordination: Secondary | ICD-10-CM | POA: Diagnosis not present

## 2019-01-22 DIAGNOSIS — Z23 Encounter for immunization: Secondary | ICD-10-CM | POA: Diagnosis not present

## 2019-01-22 DIAGNOSIS — R2681 Unsteadiness on feet: Secondary | ICD-10-CM | POA: Diagnosis not present

## 2019-01-22 HISTORY — DX: Unspecified dementia, unspecified severity, without behavioral disturbance, psychotic disturbance, mood disturbance, and anxiety: F03.90

## 2019-01-22 HISTORY — DX: Vascular dementia without behavioral disturbance: F01.50

## 2019-01-23 ENCOUNTER — Telehealth: Payer: Self-pay | Admitting: Neurology

## 2019-01-23 DIAGNOSIS — R278 Other lack of coordination: Secondary | ICD-10-CM | POA: Diagnosis not present

## 2019-01-23 DIAGNOSIS — G20 Parkinson's disease: Secondary | ICD-10-CM | POA: Diagnosis not present

## 2019-01-23 DIAGNOSIS — R260 Ataxic gait: Secondary | ICD-10-CM | POA: Diagnosis not present

## 2019-01-23 DIAGNOSIS — R2681 Unsteadiness on feet: Secondary | ICD-10-CM | POA: Diagnosis not present

## 2019-01-23 NOTE — Telephone Encounter (Signed)
Last office note faxed.

## 2019-01-23 NOTE — Telephone Encounter (Signed)
Rosana Hoes (PT) [facility] called and needs this patients most recent note faxed to [phone]. Thank you

## 2019-01-26 DIAGNOSIS — Z20828 Contact with and (suspected) exposure to other viral communicable diseases: Secondary | ICD-10-CM | POA: Diagnosis not present

## 2019-01-26 DIAGNOSIS — R278 Other lack of coordination: Secondary | ICD-10-CM | POA: Diagnosis not present

## 2019-01-26 DIAGNOSIS — Z1159 Encounter for screening for other viral diseases: Secondary | ICD-10-CM | POA: Diagnosis not present

## 2019-01-26 DIAGNOSIS — G20 Parkinson's disease: Secondary | ICD-10-CM | POA: Diagnosis not present

## 2019-01-26 DIAGNOSIS — R2681 Unsteadiness on feet: Secondary | ICD-10-CM | POA: Diagnosis not present

## 2019-01-26 DIAGNOSIS — R260 Ataxic gait: Secondary | ICD-10-CM | POA: Diagnosis not present

## 2019-01-27 DIAGNOSIS — R2681 Unsteadiness on feet: Secondary | ICD-10-CM | POA: Diagnosis not present

## 2019-01-27 DIAGNOSIS — R278 Other lack of coordination: Secondary | ICD-10-CM | POA: Diagnosis not present

## 2019-01-27 DIAGNOSIS — R260 Ataxic gait: Secondary | ICD-10-CM | POA: Diagnosis not present

## 2019-01-27 DIAGNOSIS — G20 Parkinson's disease: Secondary | ICD-10-CM | POA: Diagnosis not present

## 2019-01-28 DIAGNOSIS — G20 Parkinson's disease: Secondary | ICD-10-CM | POA: Diagnosis not present

## 2019-01-28 DIAGNOSIS — R278 Other lack of coordination: Secondary | ICD-10-CM | POA: Diagnosis not present

## 2019-01-28 DIAGNOSIS — R260 Ataxic gait: Secondary | ICD-10-CM | POA: Diagnosis not present

## 2019-01-28 DIAGNOSIS — R2681 Unsteadiness on feet: Secondary | ICD-10-CM | POA: Diagnosis not present

## 2019-01-30 DIAGNOSIS — R260 Ataxic gait: Secondary | ICD-10-CM | POA: Diagnosis not present

## 2019-01-30 DIAGNOSIS — R278 Other lack of coordination: Secondary | ICD-10-CM | POA: Diagnosis not present

## 2019-01-30 DIAGNOSIS — G20 Parkinson's disease: Secondary | ICD-10-CM | POA: Diagnosis not present

## 2019-01-30 DIAGNOSIS — R2681 Unsteadiness on feet: Secondary | ICD-10-CM | POA: Diagnosis not present

## 2019-02-02 DIAGNOSIS — R270 Ataxia, unspecified: Secondary | ICD-10-CM | POA: Diagnosis not present

## 2019-02-02 DIAGNOSIS — F0390 Unspecified dementia without behavioral disturbance: Secondary | ICD-10-CM | POA: Diagnosis not present

## 2019-02-02 DIAGNOSIS — Z789 Other specified health status: Secondary | ICD-10-CM | POA: Diagnosis not present

## 2019-02-02 DIAGNOSIS — G20 Parkinson's disease: Secondary | ICD-10-CM | POA: Diagnosis not present

## 2019-02-03 DIAGNOSIS — R260 Ataxic gait: Secondary | ICD-10-CM | POA: Diagnosis not present

## 2019-02-03 DIAGNOSIS — G20 Parkinson's disease: Secondary | ICD-10-CM | POA: Diagnosis not present

## 2019-02-03 DIAGNOSIS — R2681 Unsteadiness on feet: Secondary | ICD-10-CM | POA: Diagnosis not present

## 2019-02-03 DIAGNOSIS — R278 Other lack of coordination: Secondary | ICD-10-CM | POA: Diagnosis not present

## 2019-02-04 DIAGNOSIS — R278 Other lack of coordination: Secondary | ICD-10-CM | POA: Diagnosis not present

## 2019-02-04 DIAGNOSIS — R260 Ataxic gait: Secondary | ICD-10-CM | POA: Diagnosis not present

## 2019-02-04 DIAGNOSIS — R2681 Unsteadiness on feet: Secondary | ICD-10-CM | POA: Diagnosis not present

## 2019-02-04 DIAGNOSIS — G20 Parkinson's disease: Secondary | ICD-10-CM | POA: Diagnosis not present

## 2019-02-05 DIAGNOSIS — R2681 Unsteadiness on feet: Secondary | ICD-10-CM | POA: Diagnosis not present

## 2019-02-05 DIAGNOSIS — R260 Ataxic gait: Secondary | ICD-10-CM | POA: Diagnosis not present

## 2019-02-05 DIAGNOSIS — R278 Other lack of coordination: Secondary | ICD-10-CM | POA: Diagnosis not present

## 2019-02-05 DIAGNOSIS — G20 Parkinson's disease: Secondary | ICD-10-CM | POA: Diagnosis not present

## 2019-02-06 DIAGNOSIS — R278 Other lack of coordination: Secondary | ICD-10-CM | POA: Diagnosis not present

## 2019-02-06 DIAGNOSIS — R2681 Unsteadiness on feet: Secondary | ICD-10-CM | POA: Diagnosis not present

## 2019-02-06 DIAGNOSIS — R260 Ataxic gait: Secondary | ICD-10-CM | POA: Diagnosis not present

## 2019-02-06 DIAGNOSIS — G20 Parkinson's disease: Secondary | ICD-10-CM | POA: Diagnosis not present

## 2019-02-09 ENCOUNTER — Ambulatory Visit (INDEPENDENT_AMBULATORY_CARE_PROVIDER_SITE_OTHER): Payer: Medicare Other | Admitting: Psychology

## 2019-02-09 ENCOUNTER — Encounter: Payer: Self-pay | Admitting: Psychology

## 2019-02-09 ENCOUNTER — Other Ambulatory Visit: Payer: Self-pay

## 2019-02-09 DIAGNOSIS — F0150 Vascular dementia without behavioral disturbance: Secondary | ICD-10-CM

## 2019-02-09 DIAGNOSIS — Z20828 Contact with and (suspected) exposure to other viral communicable diseases: Secondary | ICD-10-CM | POA: Diagnosis not present

## 2019-02-09 DIAGNOSIS — R278 Other lack of coordination: Secondary | ICD-10-CM | POA: Diagnosis not present

## 2019-02-09 DIAGNOSIS — R260 Ataxic gait: Secondary | ICD-10-CM | POA: Diagnosis not present

## 2019-02-09 DIAGNOSIS — F0390 Unspecified dementia without behavioral disturbance: Secondary | ICD-10-CM

## 2019-02-09 DIAGNOSIS — G20 Parkinson's disease: Secondary | ICD-10-CM | POA: Diagnosis not present

## 2019-02-09 DIAGNOSIS — Z1159 Encounter for screening for other viral diseases: Secondary | ICD-10-CM | POA: Diagnosis not present

## 2019-02-09 DIAGNOSIS — R2681 Unsteadiness on feet: Secondary | ICD-10-CM | POA: Diagnosis not present

## 2019-02-09 NOTE — Progress Notes (Signed)
   Neuropsychology Feedback Session Dylan Moreno. [place] Department of Neurology  Reason for Referral:   Dylan Moreno a 79 y.o. 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  Feedback:   [name] completed a comprehensive neuropsychological evaluation on 01/21/2019. Please refer to that encounter for the full report and recommendations. Briefly, results suggested profound impairments surrounding retrieval and consolidation aspects of both verbal and visual memory. Additional weaknesses were seen across response inhibition and phonemic fluency, with further performance variability across cognitive flexibility and visuospatial functioning. Relative to his prior evaluation in 2018, mild performance declines were seen across retrieval/consolidation aspects of memory, as well as a task assessing motor-based spatial manipulation. Improvements were noted across semantic fluency and a single task assessing visuomotor cognitive flexibility. Other domains exhibited relatively stable performances.   [name] was accompanied by his wife during the current telephone call. Content of the current session focused on the results of his neuropsychological evaluation; we also discussed the importance of cutting back on alcohol consumption. [name] and his wife were given the opportunity to ask questions and their questions were answered. They were encouraged to reach out should additional questions arise.     20 minutes were spent conducting the current feedback session with [name], billed as one unit [phone]

## 2019-02-10 DIAGNOSIS — R278 Other lack of coordination: Secondary | ICD-10-CM | POA: Diagnosis not present

## 2019-02-10 DIAGNOSIS — G20 Parkinson's disease: Secondary | ICD-10-CM | POA: Diagnosis not present

## 2019-02-10 DIAGNOSIS — R260 Ataxic gait: Secondary | ICD-10-CM | POA: Diagnosis not present

## 2019-02-10 DIAGNOSIS — R2681 Unsteadiness on feet: Secondary | ICD-10-CM | POA: Diagnosis not present

## 2019-02-11 DIAGNOSIS — R260 Ataxic gait: Secondary | ICD-10-CM | POA: Diagnosis not present

## 2019-02-11 DIAGNOSIS — R278 Other lack of coordination: Secondary | ICD-10-CM | POA: Diagnosis not present

## 2019-02-11 DIAGNOSIS — R2681 Unsteadiness on feet: Secondary | ICD-10-CM | POA: Diagnosis not present

## 2019-02-11 DIAGNOSIS — G20 Parkinson's disease: Secondary | ICD-10-CM | POA: Diagnosis not present

## 2019-02-16 DIAGNOSIS — Z20828 Contact with and (suspected) exposure to other viral communicable diseases: Secondary | ICD-10-CM | POA: Diagnosis not present

## 2019-02-16 DIAGNOSIS — Z1159 Encounter for screening for other viral diseases: Secondary | ICD-10-CM | POA: Diagnosis not present

## 2019-02-19 DIAGNOSIS — Z23 Encounter for immunization: Secondary | ICD-10-CM | POA: Diagnosis not present

## 2019-02-25 DIAGNOSIS — G20 Parkinson's disease: Secondary | ICD-10-CM | POA: Diagnosis not present

## 2019-02-25 DIAGNOSIS — R2681 Unsteadiness on feet: Secondary | ICD-10-CM | POA: Diagnosis not present

## 2019-02-25 DIAGNOSIS — R260 Ataxic gait: Secondary | ICD-10-CM | POA: Diagnosis not present

## 2019-02-25 DIAGNOSIS — R278 Other lack of coordination: Secondary | ICD-10-CM | POA: Diagnosis not present

## 2019-03-09 DIAGNOSIS — Z1159 Encounter for screening for other viral diseases: Secondary | ICD-10-CM | POA: Diagnosis not present

## 2019-03-09 DIAGNOSIS — Z20828 Contact with and (suspected) exposure to other viral communicable diseases: Secondary | ICD-10-CM | POA: Diagnosis not present

## 2019-03-12 DIAGNOSIS — Z20828 Contact with and (suspected) exposure to other viral communicable diseases: Secondary | ICD-10-CM | POA: Diagnosis not present

## 2019-03-12 DIAGNOSIS — Z1159 Encounter for screening for other viral diseases: Secondary | ICD-10-CM | POA: Diagnosis not present

## 2019-03-16 DIAGNOSIS — Z20828 Contact with and (suspected) exposure to other viral communicable diseases: Secondary | ICD-10-CM | POA: Diagnosis not present

## 2019-03-16 DIAGNOSIS — Z1159 Encounter for screening for other viral diseases: Secondary | ICD-10-CM | POA: Diagnosis not present

## 2019-03-23 DIAGNOSIS — Z20828 Contact with and (suspected) exposure to other viral communicable diseases: Secondary | ICD-10-CM | POA: Diagnosis not present

## 2019-03-23 DIAGNOSIS — Z1159 Encounter for screening for other viral diseases: Secondary | ICD-10-CM | POA: Diagnosis not present

## 2019-04-03 ENCOUNTER — Other Ambulatory Visit: Payer: Self-pay | Admitting: Neurology

## 2019-04-16 DIAGNOSIS — Z1389 Encounter for screening for other disorder: Secondary | ICD-10-CM | POA: Diagnosis not present

## 2019-04-16 DIAGNOSIS — Z0000 Encounter for general adult medical examination without abnormal findings: Secondary | ICD-10-CM | POA: Diagnosis not present

## 2019-04-17 ENCOUNTER — Ambulatory Visit: Payer: Medicare Other | Admitting: Neurology

## 2019-05-04 DIAGNOSIS — Z1159 Encounter for screening for other viral diseases: Secondary | ICD-10-CM | POA: Diagnosis not present

## 2019-05-04 DIAGNOSIS — Z20828 Contact with and (suspected) exposure to other viral communicable diseases: Secondary | ICD-10-CM | POA: Diagnosis not present

## 2019-05-18 DIAGNOSIS — Z1159 Encounter for screening for other viral diseases: Secondary | ICD-10-CM | POA: Diagnosis not present

## 2019-05-18 DIAGNOSIS — Z20828 Contact with and (suspected) exposure to other viral communicable diseases: Secondary | ICD-10-CM | POA: Diagnosis not present

## 2019-05-22 ENCOUNTER — Encounter: Payer: Self-pay | Admitting: Hematology and Oncology

## 2019-05-22 ENCOUNTER — Inpatient Hospital Stay: Payer: Medicare Other | Attending: Hematology and Oncology | Admitting: Hematology and Oncology

## 2019-05-22 ENCOUNTER — Other Ambulatory Visit: Payer: Self-pay

## 2019-05-22 DIAGNOSIS — C01 Malignant neoplasm of base of tongue: Secondary | ICD-10-CM | POA: Insufficient documentation

## 2019-05-22 DIAGNOSIS — F1010 Alcohol abuse, uncomplicated: Secondary | ICD-10-CM | POA: Insufficient documentation

## 2019-05-22 NOTE — Progress Notes (Signed)
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   All questions were answered. The patient knows to call the clinic with any problems, questions or concerns. The total time spent in the appointment was 15 minutes encounter with patients including review of chart and various tests results, discussions about plan of care and coordination of care plan   Heath Lark, MD 05/22/2019 6:08 PM  INTERVAL HISTORY: Please see below for problem oriented charting.  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    04/07/2014 - 05/05/2014 Chemotherapy   He received 2 doses of high dose cisplatin. He could not received a third dose due to profound side effects.   04/28/2014 Adverse Reaction   Cycle 2 chemotherapy is delayed due to neutropenia   05/25/2014 - 06/03/2014 Hospital Admission   He was admitted to the hospital for management of acute delirium.   07/19/2014 Imaging   PET/CT, restaging:  Resolution of tongue base lesion; significant decrease inbilateral cervical lymphadenopathy.  No definite evidence of metastatic disease.   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   12/21/2014 Imaging   Repeat Ct scan showed no evidence of cancer   02/02/2015 Procedure   Port-a-cath and PEG removed.   05/23/2015 Imaging   1. Lung-RADS Category 2, benign appearance or behavior. Continue annual screening with low-dose chest CT without contrast in 12 months. 2. Three-vessel coronary artery calcification   06/07/2016 Imaging  Scattered right lung nodules measuring up to 5 mm, unchanged, likely benign   05/15/2017 Imaging   1. Ongoing stability of bilateral pulmonary nodules, primarily felt to represent subpleural lymph nodes. This ongoing stability is most consistent with a benign etiology. 2. No acute process or evidence of metastatic disease in the chest. 3. Aortic atherosclerosis  (ICD10-I70.0), coronary artery atherosclerosis and emphysema (ICD10-J43.9). 4. Aortic valvular calcifications. Consider echocardiography to evaluate for valvular dysfunction.     REVIEW OF SYSTEMS:   Constitutional: Denies fevers, chills or abnormal weight loss Eyes: Denies blurriness of vision Ears, nose, mouth, throat, and face: Denies mucositis or sore throat Respiratory: Denies cough, dyspnea or wheezes Cardiovascular: Denies palpitation, chest discomfort or lower extremity swelling Gastrointestinal:  Denies nausea, heartburn or change in bowel habits Skin: Denies abnormal skin rashes Lymphatics: Denies new lymphadenopathy or easy bruising Neurological:Denies numbness, tingling or new weaknesses Behavioral/Psych: Mood is stable, no new changes  All other systems were reviewed with the patient and are negative.  I have reviewed the past medical history, past surgical history, social history and family history with the patient and they are unchanged from previous note.  ALLERGIES:  has No Known Allergies.  MEDICATIONS:  Current Outpatient Medications  Medication Sig Dispense Refill  . atorvastatin (LIPITOR) 40 MG tablet Take 40 mg by mouth daily.    . carbidopa-levodopa (SINEMET IR) 25-100 MG tablet TAKE 1/2 TABLET 30-MIN BEFORE BREAKFAST & DINNER X 1 WEEK, THEN 1 TABLET TWICE A DAY THEREAFTER 180 tablet 1  . clopidogrel (PLAVIX) 75 MG tablet Place 1 tablet (75 mg total) into feeding tube daily. (Patient taking differently: Take 75 mg by mouth daily. ) 30 tablet 0  . donepezil (ARICEPT) 10 MG tablet TAKE 1 TABLET BY MOUTH EVERY DAY 90 tablet 3  . levothyroxine (SYNTHROID, LEVOTHROID) 112 MCG tablet Take 112 mcg by mouth daily.  3  . losartan (COZAAR) 25 MG tablet Take 25 mg by mouth daily.    . tamsulosin (FLOMAX) 0.4 MG CAPS capsule Take 0.4 mg by mouth daily.  3  . thiamine 250 MG tablet Take 250 mg by mouth daily.      No current facility-administered medications for this  visit.    PHYSICAL EXAMINATION: ECOG PERFORMANCE STATUS: 1 - Symptomatic but completely ambulatory  Vitals:   05/22/19 1252  BP: (!) 172/75  Pulse: 81  Resp: 18  Temp: 98.7 F (37.1 C)  SpO2: 97%   Filed Weights   05/22/19 1252  Weight: 178 lb 8 oz (81 kg)    GENERAL:alert, no distress and comfortable SKIN: skin color, texture, turgor are normal, no rashes or significant lesions EYES: normal, Conjunctiva are pink and non-injected, sclera clear OROPHARYNX:no exudate, no erythema and lips, buccal mucosa, and tongue normal.  Poor dentition is noted NECK: Neck is thick and fibrosed from prior radiation LYMPH:  no palpable lymphadenopathy in the cervical, axillary or inguinal LUNGS: clear to auscultation and percussion with normal breathing effort HEART: regular rate & rhythm and no murmurs and no lower extremity edema ABDOMEN:abdomen soft, non-tender and normal bowel sounds Musculoskeletal:no cyanosis of digits and no clubbing  NEURO: alert & oriented x 3 with fluent speech, no focal motor/sensory deficits  LABORATORY DATA:  I have reviewed the data as listed    Component Value Date/Time   NA 140 05/15/2017 1338   NA 141 05/18/2016 1413   K 4.5 05/15/2017 1338   K 4.1 05/18/2016 1413   CL 109 05/15/2017 1338   CO2 25  05/15/2017 1338   CO2 24 05/18/2016 1413   GLUCOSE 91 05/15/2017 1338   GLUCOSE 105 05/18/2016 1413   BUN 22 05/15/2017 1338   BUN 19.9 05/18/2016 1413   CREATININE 1.24 05/15/2017 1338   CREATININE 1.0 05/18/2016 1413   CALCIUM 9.9 05/15/2017 1338   CALCIUM 9.9 05/18/2016 1413   PROT 7.6 05/15/2017 1338   PROT 6.9 05/18/2016 1413   ALBUMIN 4.4 05/15/2017 1338   ALBUMIN 4.0 05/18/2016 1413   AST 23 05/15/2017 1338   AST 23 05/18/2016 1413   ALT 21 05/15/2017 1338   ALT 23 05/18/2016 1413   ALKPHOS 83 05/15/2017 1338   ALKPHOS 97 05/18/2016 1413   BILITOT 1.1 05/15/2017 1338   BILITOT 1.26 (H) 05/18/2016 1413   GFRNONAA 55 (L) 05/15/2017 1338    GFRAA >60 05/15/2017 1338    No results found for: SPEP, UPEP  Lab Results  Component Value Date   WBC 6.1 05/15/2017   NEUTROABS 4.6 05/15/2017   HGB 15.8 05/15/2017   HCT 47.1 05/15/2017   MCV 91.2 05/15/2017   PLT 194 05/15/2017      Chemistry      Component Value Date/Time   NA 140 05/15/2017 1338   NA 141 05/18/2016 1413   K 4.5 05/15/2017 1338   K 4.1 05/18/2016 1413   CL 109 05/15/2017 1338   CO2 25 05/15/2017 1338   CO2 24 05/18/2016 1413   BUN 22 05/15/2017 1338   BUN 19.9 05/18/2016 1413   CREATININE 1.24 05/15/2017 1338   CREATININE 1.0 05/18/2016 1413      Component Value Date/Time   CALCIUM 9.9 05/15/2017 1338   CALCIUM 9.9 05/18/2016 1413   ALKPHOS 83 05/15/2017 1338   ALKPHOS 97 05/18/2016 1413   AST 23 05/15/2017 1338   AST 23 05/18/2016 1413   ALT 21 05/15/2017 1338   ALT 23 05/18/2016 1413   BILITOT 1.1 05/15/2017 1338   BILITOT 1.26 (H) 05/18/2016 1413

## 2019-05-22 NOTE — Assessment & Plan Note (Signed)
He has no signs of cancer recurrence We discussed risk factors that could cause risks of recurrence He has regular appointment to see his dentist The patient and family are educated to watch out for signs and symptoms of cancer recurrence He is a long-term cancer survivor and I will discharge him from the clinic

## 2019-05-22 NOTE — Assessment & Plan Note (Signed)
He continues to have alcohol abuse I have discussed this with him and his wife many times about the importance of alcohol cessation

## 2019-05-25 DIAGNOSIS — Z1159 Encounter for screening for other viral diseases: Secondary | ICD-10-CM | POA: Diagnosis not present

## 2019-05-25 DIAGNOSIS — Z20828 Contact with and (suspected) exposure to other viral communicable diseases: Secondary | ICD-10-CM | POA: Diagnosis not present

## 2019-06-01 DIAGNOSIS — Z1159 Encounter for screening for other viral diseases: Secondary | ICD-10-CM | POA: Diagnosis not present

## 2019-06-01 DIAGNOSIS — Z20828 Contact with and (suspected) exposure to other viral communicable diseases: Secondary | ICD-10-CM | POA: Diagnosis not present

## 2019-06-08 DIAGNOSIS — Z20828 Contact with and (suspected) exposure to other viral communicable diseases: Secondary | ICD-10-CM | POA: Diagnosis not present

## 2019-06-08 DIAGNOSIS — Z1159 Encounter for screening for other viral diseases: Secondary | ICD-10-CM | POA: Diagnosis not present

## 2019-07-09 DIAGNOSIS — Z20828 Contact with and (suspected) exposure to other viral communicable diseases: Secondary | ICD-10-CM | POA: Diagnosis not present

## 2019-07-09 DIAGNOSIS — Z1159 Encounter for screening for other viral diseases: Secondary | ICD-10-CM | POA: Diagnosis not present

## 2019-07-13 DIAGNOSIS — Z1159 Encounter for screening for other viral diseases: Secondary | ICD-10-CM | POA: Diagnosis not present

## 2019-07-13 DIAGNOSIS — Z20828 Contact with and (suspected) exposure to other viral communicable diseases: Secondary | ICD-10-CM | POA: Diagnosis not present

## 2019-07-20 DIAGNOSIS — Z1159 Encounter for screening for other viral diseases: Secondary | ICD-10-CM | POA: Diagnosis not present

## 2019-07-20 DIAGNOSIS — Z20828 Contact with and (suspected) exposure to other viral communicable diseases: Secondary | ICD-10-CM | POA: Diagnosis not present

## 2019-07-27 DIAGNOSIS — Z1159 Encounter for screening for other viral diseases: Secondary | ICD-10-CM | POA: Diagnosis not present

## 2019-07-27 DIAGNOSIS — Z20828 Contact with and (suspected) exposure to other viral communicable diseases: Secondary | ICD-10-CM | POA: Diagnosis not present

## 2019-08-03 DIAGNOSIS — Z1159 Encounter for screening for other viral diseases: Secondary | ICD-10-CM | POA: Diagnosis not present

## 2019-08-03 DIAGNOSIS — Z20828 Contact with and (suspected) exposure to other viral communicable diseases: Secondary | ICD-10-CM | POA: Diagnosis not present

## 2019-08-10 DIAGNOSIS — Z20828 Contact with and (suspected) exposure to other viral communicable diseases: Secondary | ICD-10-CM | POA: Diagnosis not present

## 2019-08-10 DIAGNOSIS — Z1159 Encounter for screening for other viral diseases: Secondary | ICD-10-CM | POA: Diagnosis not present

## 2019-08-17 DIAGNOSIS — Z20828 Contact with and (suspected) exposure to other viral communicable diseases: Secondary | ICD-10-CM | POA: Diagnosis not present

## 2019-08-17 DIAGNOSIS — Z1159 Encounter for screening for other viral diseases: Secondary | ICD-10-CM | POA: Diagnosis not present

## 2019-08-24 DIAGNOSIS — Z20828 Contact with and (suspected) exposure to other viral communicable diseases: Secondary | ICD-10-CM | POA: Diagnosis not present

## 2019-08-24 DIAGNOSIS — Z1159 Encounter for screening for other viral diseases: Secondary | ICD-10-CM | POA: Diagnosis not present

## 2019-08-31 DIAGNOSIS — Z1159 Encounter for screening for other viral diseases: Secondary | ICD-10-CM | POA: Diagnosis not present

## 2019-08-31 DIAGNOSIS — Z20828 Contact with and (suspected) exposure to other viral communicable diseases: Secondary | ICD-10-CM | POA: Diagnosis not present

## 2019-09-07 DIAGNOSIS — Z1159 Encounter for screening for other viral diseases: Secondary | ICD-10-CM | POA: Diagnosis not present

## 2019-09-07 DIAGNOSIS — Z20828 Contact with and (suspected) exposure to other viral communicable diseases: Secondary | ICD-10-CM | POA: Diagnosis not present

## 2019-09-14 DIAGNOSIS — Z1159 Encounter for screening for other viral diseases: Secondary | ICD-10-CM | POA: Diagnosis not present

## 2019-09-14 DIAGNOSIS — Z20828 Contact with and (suspected) exposure to other viral communicable diseases: Secondary | ICD-10-CM | POA: Diagnosis not present

## 2019-09-21 DIAGNOSIS — Z20828 Contact with and (suspected) exposure to other viral communicable diseases: Secondary | ICD-10-CM | POA: Diagnosis not present

## 2019-09-21 DIAGNOSIS — Z1159 Encounter for screening for other viral diseases: Secondary | ICD-10-CM | POA: Diagnosis not present

## 2019-09-28 ENCOUNTER — Other Ambulatory Visit: Payer: Self-pay | Admitting: Neurology

## 2019-09-28 DIAGNOSIS — Z20828 Contact with and (suspected) exposure to other viral communicable diseases: Secondary | ICD-10-CM | POA: Diagnosis not present

## 2019-09-28 DIAGNOSIS — Z1159 Encounter for screening for other viral diseases: Secondary | ICD-10-CM | POA: Diagnosis not present

## 2019-10-05 DIAGNOSIS — Z20828 Contact with and (suspected) exposure to other viral communicable diseases: Secondary | ICD-10-CM | POA: Diagnosis not present

## 2019-10-05 DIAGNOSIS — Z1159 Encounter for screening for other viral diseases: Secondary | ICD-10-CM | POA: Diagnosis not present

## 2019-10-12 DIAGNOSIS — Z20828 Contact with and (suspected) exposure to other viral communicable diseases: Secondary | ICD-10-CM | POA: Diagnosis not present

## 2019-10-12 DIAGNOSIS — Z1159 Encounter for screening for other viral diseases: Secondary | ICD-10-CM | POA: Diagnosis not present

## 2019-10-19 DIAGNOSIS — Z1159 Encounter for screening for other viral diseases: Secondary | ICD-10-CM | POA: Diagnosis not present

## 2019-10-19 DIAGNOSIS — Z20828 Contact with and (suspected) exposure to other viral communicable diseases: Secondary | ICD-10-CM | POA: Diagnosis not present

## 2019-10-26 DIAGNOSIS — Z20828 Contact with and (suspected) exposure to other viral communicable diseases: Secondary | ICD-10-CM | POA: Diagnosis not present

## 2019-10-26 DIAGNOSIS — Z1159 Encounter for screening for other viral diseases: Secondary | ICD-10-CM | POA: Diagnosis not present

## 2019-11-02 DIAGNOSIS — Z20828 Contact with and (suspected) exposure to other viral communicable diseases: Secondary | ICD-10-CM | POA: Diagnosis not present

## 2019-11-02 DIAGNOSIS — Z1159 Encounter for screening for other viral diseases: Secondary | ICD-10-CM | POA: Diagnosis not present

## 2019-11-09 DIAGNOSIS — Z20828 Contact with and (suspected) exposure to other viral communicable diseases: Secondary | ICD-10-CM | POA: Diagnosis not present

## 2019-11-09 DIAGNOSIS — Z1159 Encounter for screening for other viral diseases: Secondary | ICD-10-CM | POA: Diagnosis not present

## 2019-11-13 DIAGNOSIS — Z23 Encounter for immunization: Secondary | ICD-10-CM | POA: Diagnosis not present

## 2019-11-16 DIAGNOSIS — Z20828 Contact with and (suspected) exposure to other viral communicable diseases: Secondary | ICD-10-CM | POA: Diagnosis not present

## 2019-11-16 DIAGNOSIS — Z1159 Encounter for screening for other viral diseases: Secondary | ICD-10-CM | POA: Diagnosis not present

## 2019-11-23 DIAGNOSIS — Z20828 Contact with and (suspected) exposure to other viral communicable diseases: Secondary | ICD-10-CM | POA: Diagnosis not present

## 2019-11-23 DIAGNOSIS — Z1159 Encounter for screening for other viral diseases: Secondary | ICD-10-CM | POA: Diagnosis not present

## 2019-11-30 DIAGNOSIS — Z20828 Contact with and (suspected) exposure to other viral communicable diseases: Secondary | ICD-10-CM | POA: Diagnosis not present

## 2019-11-30 DIAGNOSIS — Z1159 Encounter for screening for other viral diseases: Secondary | ICD-10-CM | POA: Diagnosis not present

## 2019-12-07 DIAGNOSIS — Z20828 Contact with and (suspected) exposure to other viral communicable diseases: Secondary | ICD-10-CM | POA: Diagnosis not present

## 2019-12-07 DIAGNOSIS — Z1159 Encounter for screening for other viral diseases: Secondary | ICD-10-CM | POA: Diagnosis not present

## 2019-12-14 DIAGNOSIS — Z20828 Contact with and (suspected) exposure to other viral communicable diseases: Secondary | ICD-10-CM | POA: Diagnosis not present

## 2019-12-14 DIAGNOSIS — Z1159 Encounter for screening for other viral diseases: Secondary | ICD-10-CM | POA: Diagnosis not present

## 2019-12-21 DIAGNOSIS — Z1159 Encounter for screening for other viral diseases: Secondary | ICD-10-CM | POA: Diagnosis not present

## 2019-12-21 DIAGNOSIS — Z20828 Contact with and (suspected) exposure to other viral communicable diseases: Secondary | ICD-10-CM | POA: Diagnosis not present

## 2019-12-28 DIAGNOSIS — Z1159 Encounter for screening for other viral diseases: Secondary | ICD-10-CM | POA: Diagnosis not present

## 2019-12-28 DIAGNOSIS — Z20828 Contact with and (suspected) exposure to other viral communicable diseases: Secondary | ICD-10-CM | POA: Diagnosis not present

## 2019-12-30 ENCOUNTER — Other Ambulatory Visit: Payer: Self-pay | Admitting: Neurology

## 2020-01-04 DIAGNOSIS — Z1159 Encounter for screening for other viral diseases: Secondary | ICD-10-CM | POA: Diagnosis not present

## 2020-01-04 DIAGNOSIS — Z20828 Contact with and (suspected) exposure to other viral communicable diseases: Secondary | ICD-10-CM | POA: Diagnosis not present

## 2020-01-11 DIAGNOSIS — Z20828 Contact with and (suspected) exposure to other viral communicable diseases: Secondary | ICD-10-CM | POA: Diagnosis not present

## 2020-01-11 DIAGNOSIS — Z1159 Encounter for screening for other viral diseases: Secondary | ICD-10-CM | POA: Diagnosis not present

## 2020-01-18 DIAGNOSIS — Z1159 Encounter for screening for other viral diseases: Secondary | ICD-10-CM | POA: Diagnosis not present

## 2020-01-18 DIAGNOSIS — Z20828 Contact with and (suspected) exposure to other viral communicable diseases: Secondary | ICD-10-CM | POA: Diagnosis not present

## 2020-01-25 DIAGNOSIS — Z1159 Encounter for screening for other viral diseases: Secondary | ICD-10-CM | POA: Diagnosis not present

## 2020-01-25 DIAGNOSIS — Z20828 Contact with and (suspected) exposure to other viral communicable diseases: Secondary | ICD-10-CM | POA: Diagnosis not present

## 2020-02-01 DIAGNOSIS — Z1159 Encounter for screening for other viral diseases: Secondary | ICD-10-CM | POA: Diagnosis not present

## 2020-02-01 DIAGNOSIS — Z20828 Contact with and (suspected) exposure to other viral communicable diseases: Secondary | ICD-10-CM | POA: Diagnosis not present

## 2020-02-08 DIAGNOSIS — Z20828 Contact with and (suspected) exposure to other viral communicable diseases: Secondary | ICD-10-CM | POA: Diagnosis not present

## 2020-02-08 DIAGNOSIS — Z1159 Encounter for screening for other viral diseases: Secondary | ICD-10-CM | POA: Diagnosis not present

## 2020-02-15 DIAGNOSIS — Z20828 Contact with and (suspected) exposure to other viral communicable diseases: Secondary | ICD-10-CM | POA: Diagnosis not present

## 2020-02-15 DIAGNOSIS — Z1159 Encounter for screening for other viral diseases: Secondary | ICD-10-CM | POA: Diagnosis not present

## 2020-02-22 DIAGNOSIS — Z1159 Encounter for screening for other viral diseases: Secondary | ICD-10-CM | POA: Diagnosis not present

## 2020-02-22 DIAGNOSIS — Z20828 Contact with and (suspected) exposure to other viral communicable diseases: Secondary | ICD-10-CM | POA: Diagnosis not present

## 2020-02-25 DIAGNOSIS — Z1159 Encounter for screening for other viral diseases: Secondary | ICD-10-CM | POA: Diagnosis not present

## 2020-02-25 DIAGNOSIS — Z20828 Contact with and (suspected) exposure to other viral communicable diseases: Secondary | ICD-10-CM | POA: Diagnosis not present

## 2020-02-29 DIAGNOSIS — Z1159 Encounter for screening for other viral diseases: Secondary | ICD-10-CM | POA: Diagnosis not present

## 2020-02-29 DIAGNOSIS — Z20828 Contact with and (suspected) exposure to other viral communicable diseases: Secondary | ICD-10-CM | POA: Diagnosis not present

## 2020-03-07 DIAGNOSIS — Z20828 Contact with and (suspected) exposure to other viral communicable diseases: Secondary | ICD-10-CM | POA: Diagnosis not present

## 2020-03-07 DIAGNOSIS — Z1159 Encounter for screening for other viral diseases: Secondary | ICD-10-CM | POA: Diagnosis not present

## 2020-03-14 DIAGNOSIS — Z20828 Contact with and (suspected) exposure to other viral communicable diseases: Secondary | ICD-10-CM | POA: Diagnosis not present

## 2020-03-14 DIAGNOSIS — Z1159 Encounter for screening for other viral diseases: Secondary | ICD-10-CM | POA: Diagnosis not present

## 2020-03-21 DIAGNOSIS — Z1159 Encounter for screening for other viral diseases: Secondary | ICD-10-CM | POA: Diagnosis not present

## 2020-03-21 DIAGNOSIS — Z20828 Contact with and (suspected) exposure to other viral communicable diseases: Secondary | ICD-10-CM | POA: Diagnosis not present

## 2020-03-28 DIAGNOSIS — Z1159 Encounter for screening for other viral diseases: Secondary | ICD-10-CM | POA: Diagnosis not present

## 2020-03-28 DIAGNOSIS — Z20828 Contact with and (suspected) exposure to other viral communicable diseases: Secondary | ICD-10-CM | POA: Diagnosis not present

## 2020-03-30 ENCOUNTER — Other Ambulatory Visit: Payer: Self-pay | Admitting: Neurology

## 2020-04-04 DIAGNOSIS — Z1159 Encounter for screening for other viral diseases: Secondary | ICD-10-CM | POA: Diagnosis not present

## 2020-04-04 DIAGNOSIS — Z20828 Contact with and (suspected) exposure to other viral communicable diseases: Secondary | ICD-10-CM | POA: Diagnosis not present

## 2020-04-11 DIAGNOSIS — Z1159 Encounter for screening for other viral diseases: Secondary | ICD-10-CM | POA: Diagnosis not present

## 2020-04-11 DIAGNOSIS — Z20828 Contact with and (suspected) exposure to other viral communicable diseases: Secondary | ICD-10-CM | POA: Diagnosis not present

## 2020-04-18 DIAGNOSIS — Z20828 Contact with and (suspected) exposure to other viral communicable diseases: Secondary | ICD-10-CM | POA: Diagnosis not present

## 2020-04-18 DIAGNOSIS — Z1159 Encounter for screening for other viral diseases: Secondary | ICD-10-CM | POA: Diagnosis not present

## 2020-04-25 DIAGNOSIS — Z20828 Contact with and (suspected) exposure to other viral communicable diseases: Secondary | ICD-10-CM | POA: Diagnosis not present

## 2020-04-25 DIAGNOSIS — Z1159 Encounter for screening for other viral diseases: Secondary | ICD-10-CM | POA: Diagnosis not present

## 2020-05-02 DIAGNOSIS — Z1159 Encounter for screening for other viral diseases: Secondary | ICD-10-CM | POA: Diagnosis not present

## 2020-05-02 DIAGNOSIS — Z20828 Contact with and (suspected) exposure to other viral communicable diseases: Secondary | ICD-10-CM | POA: Diagnosis not present

## 2020-05-04 ENCOUNTER — Other Ambulatory Visit: Payer: Self-pay | Admitting: Neurology

## 2020-05-09 DIAGNOSIS — Z1159 Encounter for screening for other viral diseases: Secondary | ICD-10-CM | POA: Diagnosis not present

## 2020-05-09 DIAGNOSIS — Z20828 Contact with and (suspected) exposure to other viral communicable diseases: Secondary | ICD-10-CM | POA: Diagnosis not present

## 2020-05-16 DIAGNOSIS — Z20828 Contact with and (suspected) exposure to other viral communicable diseases: Secondary | ICD-10-CM | POA: Diagnosis not present

## 2020-05-16 DIAGNOSIS — Z1159 Encounter for screening for other viral diseases: Secondary | ICD-10-CM | POA: Diagnosis not present

## 2020-05-23 DIAGNOSIS — Z20828 Contact with and (suspected) exposure to other viral communicable diseases: Secondary | ICD-10-CM | POA: Diagnosis not present

## 2020-05-23 DIAGNOSIS — Z1159 Encounter for screening for other viral diseases: Secondary | ICD-10-CM | POA: Diagnosis not present

## 2020-05-27 ENCOUNTER — Other Ambulatory Visit: Payer: Self-pay | Admitting: Neurology

## 2020-05-30 DIAGNOSIS — Z20828 Contact with and (suspected) exposure to other viral communicable diseases: Secondary | ICD-10-CM | POA: Diagnosis not present

## 2020-05-30 DIAGNOSIS — Z1159 Encounter for screening for other viral diseases: Secondary | ICD-10-CM | POA: Diagnosis not present

## 2020-06-06 DIAGNOSIS — Z1159 Encounter for screening for other viral diseases: Secondary | ICD-10-CM | POA: Diagnosis not present

## 2020-06-06 DIAGNOSIS — Z20828 Contact with and (suspected) exposure to other viral communicable diseases: Secondary | ICD-10-CM | POA: Diagnosis not present

## 2020-06-10 ENCOUNTER — Ambulatory Visit: Payer: Medicare Other | Admitting: Neurology

## 2020-06-13 DIAGNOSIS — Z1159 Encounter for screening for other viral diseases: Secondary | ICD-10-CM | POA: Diagnosis not present

## 2020-06-13 DIAGNOSIS — Z20828 Contact with and (suspected) exposure to other viral communicable diseases: Secondary | ICD-10-CM | POA: Diagnosis not present

## 2020-06-15 ENCOUNTER — Other Ambulatory Visit: Payer: Self-pay | Admitting: Neurology

## 2020-06-20 DIAGNOSIS — Z1159 Encounter for screening for other viral diseases: Secondary | ICD-10-CM | POA: Diagnosis not present

## 2020-06-20 DIAGNOSIS — Z20828 Contact with and (suspected) exposure to other viral communicable diseases: Secondary | ICD-10-CM | POA: Diagnosis not present

## 2020-06-27 DIAGNOSIS — Z1159 Encounter for screening for other viral diseases: Secondary | ICD-10-CM | POA: Diagnosis not present

## 2020-06-27 DIAGNOSIS — Z20828 Contact with and (suspected) exposure to other viral communicable diseases: Secondary | ICD-10-CM | POA: Diagnosis not present

## 2020-07-04 DIAGNOSIS — Z20828 Contact with and (suspected) exposure to other viral communicable diseases: Secondary | ICD-10-CM | POA: Diagnosis not present

## 2020-07-04 DIAGNOSIS — Z1159 Encounter for screening for other viral diseases: Secondary | ICD-10-CM | POA: Diagnosis not present

## 2020-07-05 ENCOUNTER — Other Ambulatory Visit: Payer: Self-pay | Admitting: Neurology

## 2020-07-07 ENCOUNTER — Other Ambulatory Visit: Payer: Self-pay | Admitting: Neurology

## 2020-07-08 ENCOUNTER — Other Ambulatory Visit: Payer: Self-pay | Admitting: Neurology

## 2020-07-08 NOTE — Telephone Encounter (Signed)
Pt's wife called in needing a refill of donepezil and carbidopa-levodopa. She scheduled a follow up appointment on 08/04/20

## 2020-07-11 DIAGNOSIS — Z1159 Encounter for screening for other viral diseases: Secondary | ICD-10-CM | POA: Diagnosis not present

## 2020-07-11 DIAGNOSIS — Z20828 Contact with and (suspected) exposure to other viral communicable diseases: Secondary | ICD-10-CM | POA: Diagnosis not present

## 2020-07-11 MED ORDER — DONEPEZIL HCL 10 MG PO TABS: 10.0000 mg | ORAL_TABLET | Freq: Every day | ORAL | 0 refills | Status: DC

## 2020-07-11 MED ORDER — CARBIDOPA-LEVODOPA 25-100 MG PO TABS: ORAL_TABLET | ORAL | 0 refills | Status: DC

## 2020-07-11 NOTE — Telephone Encounter (Signed)
Called and spoke to patients [name] and informed her that prescriptions were sent to pharmacy.

## 2020-07-11 NOTE — Telephone Encounter (Signed)
Rx sent for 30-day.

## 2020-07-18 DIAGNOSIS — Z20828 Contact with and (suspected) exposure to other viral communicable diseases: Secondary | ICD-10-CM | POA: Diagnosis not present

## 2020-07-18 DIAGNOSIS — Z1159 Encounter for screening for other viral diseases: Secondary | ICD-10-CM | POA: Diagnosis not present

## 2020-07-25 DIAGNOSIS — Z20828 Contact with and (suspected) exposure to other viral communicable diseases: Secondary | ICD-10-CM | POA: Diagnosis not present

## 2020-07-25 DIAGNOSIS — Z1159 Encounter for screening for other viral diseases: Secondary | ICD-10-CM | POA: Diagnosis not present

## 2020-08-01 DIAGNOSIS — Z1159 Encounter for screening for other viral diseases: Secondary | ICD-10-CM | POA: Diagnosis not present

## 2020-08-01 DIAGNOSIS — Z20828 Contact with and (suspected) exposure to other viral communicable diseases: Secondary | ICD-10-CM | POA: Diagnosis not present

## 2020-08-04 ENCOUNTER — Ambulatory Visit: Payer: Medicare Other | Admitting: Neurology

## 2020-08-05 DIAGNOSIS — Z23 Encounter for immunization: Secondary | ICD-10-CM | POA: Diagnosis not present

## 2020-08-07 ENCOUNTER — Other Ambulatory Visit: Payer: Self-pay | Admitting: Neurology

## 2020-08-11 ENCOUNTER — Other Ambulatory Visit: Payer: Self-pay | Admitting: Neurology

## 2020-08-15 DIAGNOSIS — Z20828 Contact with and (suspected) exposure to other viral communicable diseases: Secondary | ICD-10-CM | POA: Diagnosis not present

## 2020-08-22 DIAGNOSIS — Z20828 Contact with and (suspected) exposure to other viral communicable diseases: Secondary | ICD-10-CM | POA: Diagnosis not present

## 2020-08-29 DIAGNOSIS — Z20828 Contact with and (suspected) exposure to other viral communicable diseases: Secondary | ICD-10-CM | POA: Diagnosis not present

## 2020-09-10 ENCOUNTER — Other Ambulatory Visit: Payer: Self-pay | Admitting: Neurology

## 2020-09-12 NOTE — Telephone Encounter (Signed)
Enough given until appt with Dr.Patel 09/26/2020, no further refills till seen.

## 2020-09-19 DIAGNOSIS — Z8616 Personal history of COVID-19: Secondary | ICD-10-CM | POA: Diagnosis not present

## 2020-09-26 ENCOUNTER — Ambulatory Visit: Payer: Medicare Other | Admitting: Neurology

## 2020-10-12 ENCOUNTER — Telehealth: Payer: Self-pay | Admitting: Neurology

## 2020-10-12 NOTE — Telephone Encounter (Signed)
Pt's wife called in wanting a refill on 2 prescriptions that ended up being prescribed by a different doctor. She is going to call their office.

## 2020-10-19 ENCOUNTER — Other Ambulatory Visit: Payer: Self-pay | Admitting: Neurology

## 2020-10-25 DIAGNOSIS — R7309 Other abnormal glucose: Secondary | ICD-10-CM | POA: Diagnosis not present

## 2020-10-31 DIAGNOSIS — Z20828 Contact with and (suspected) exposure to other viral communicable diseases: Secondary | ICD-10-CM | POA: Diagnosis not present

## 2020-11-21 DIAGNOSIS — Z20822 Contact with and (suspected) exposure to covid-19: Secondary | ICD-10-CM | POA: Diagnosis not present

## 2020-11-25 ENCOUNTER — Other Ambulatory Visit: Payer: Self-pay | Admitting: Neurology

## 2020-11-28 DIAGNOSIS — Z20828 Contact with and (suspected) exposure to other viral communicable diseases: Secondary | ICD-10-CM | POA: Diagnosis not present

## 2020-11-28 DIAGNOSIS — Z1159 Encounter for screening for other viral diseases: Secondary | ICD-10-CM | POA: Diagnosis not present

## 2020-12-05 DIAGNOSIS — Z1159 Encounter for screening for other viral diseases: Secondary | ICD-10-CM | POA: Diagnosis not present

## 2020-12-05 DIAGNOSIS — Z20828 Contact with and (suspected) exposure to other viral communicable diseases: Secondary | ICD-10-CM | POA: Diagnosis not present

## 2020-12-12 ENCOUNTER — Ambulatory Visit: Payer: Medicare Other | Admitting: Neurology

## 2020-12-12 DIAGNOSIS — Z1159 Encounter for screening for other viral diseases: Secondary | ICD-10-CM | POA: Diagnosis not present

## 2020-12-12 DIAGNOSIS — Z20828 Contact with and (suspected) exposure to other viral communicable diseases: Secondary | ICD-10-CM | POA: Diagnosis not present

## 2020-12-14 ENCOUNTER — Other Ambulatory Visit: Payer: Self-pay

## 2020-12-14 ENCOUNTER — Encounter: Payer: Self-pay | Admitting: Neurology

## 2020-12-14 ENCOUNTER — Ambulatory Visit (INDEPENDENT_AMBULATORY_CARE_PROVIDER_SITE_OTHER): Payer: Medicare Other | Admitting: Neurology

## 2020-12-14 VITALS — BP 174/87 | HR 82 | Ht 74.0 in | Wt 178.0 lb

## 2020-12-14 DIAGNOSIS — R269 Unspecified abnormalities of gait and mobility: Secondary | ICD-10-CM | POA: Diagnosis not present

## 2020-12-14 DIAGNOSIS — Z1159 Encounter for screening for other viral diseases: Secondary | ICD-10-CM | POA: Diagnosis not present

## 2020-12-14 DIAGNOSIS — Z20828 Contact with and (suspected) exposure to other viral communicable diseases: Secondary | ICD-10-CM | POA: Diagnosis not present

## 2020-12-14 DIAGNOSIS — G621 Alcoholic polyneuropathy: Secondary | ICD-10-CM

## 2020-12-14 DIAGNOSIS — F0390 Unspecified dementia without behavioral disturbance: Secondary | ICD-10-CM | POA: Diagnosis not present

## 2020-12-14 MED ORDER — DONEPEZIL HCL 10 MG PO TABS: 10.0000 mg | ORAL_TABLET | Freq: Every day | ORAL | 3 refills | Status: DC

## 2020-12-14 NOTE — Patient Instructions (Signed)
You can stop carbidopa-levadopa and monitor for any changes.  Consider starting physical therapy  Return to clinic in 1 year

## 2020-12-14 NOTE — Progress Notes (Signed)
Follow-up Visit   Date: 12/14/20    Dylan Moreno MRN: [id number] DOB: 1940-03-12   Interim History: Dylan Moreno is a 80 y.o. right-handed Caucasian male with history of hyperlipidemia, tobacco use, ADD, TIA (12/29/2012), squamous cell carcinoma s/p chemo/radiation (Spring 2016) returning to the clinic for complaints of gait change and worsening memory.  The patient was accompanied to the clinic by wife.  History of present illness: He presented to the Emergency Department on 12/29/2012 with acute onset of left facial droop, lasting one hour. MRI/A of the brain did not show acute stroke. There was a note of ventriculomegaly.  US carotids, echo, and holter was normal. He was previously taking lipitor 10mg  and was taking aspirin 81mg  daily. He was discharged on aspirin 325mg .  However, upon follow-up visit with me, I switched him to plavix 75mg  as he was already taking aspirin.  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 Neuropsychological testing with Dr. Valentina Shaggy showed neurocognitive disorder, unspecified, and ADHD.  He was cleared to drive by occupational therapy.   In 2018, he started having greater difficulty using his phone and remote control, as well as increased forgetfulness with events/conversations.  He tends to be irritable at night, which he wife attributes to his drinking.  Despite my recommendation to abstain from alcohol, he continues to consume 2-3 drinks nightly.  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  He has not been compliant with his medications for ADHD.   He moved in independent living at [facility] in December 2018.    UPDATE 11/21/2018:   Over the past 3 months, his gait has become more unsteady and  his leg get shaky.  He has fallen several times.  Wife has noticed that steps are much smaller and he has started to use a walker.  His memory is also getting worse, she has increase caregiver support at home for medication administration and household chores. Family feels that he should not longer drive, but patient disagrees.  They moved him into unit within the main building at [facility] due to his gait change.  He has been having intermittent bladder incontinence for the past 3 months which is new. He continues to drink scotch about 1.5 - 2 oz nightly.    UPDATE 12/14/2020: He is accompanied by his wife.  He is living in independent living at [facility] and on the wait-list for assisted living.  His balance has become poor and he has had a few falls.  He has been using a Theatre manager and walker at home. He uses a transport chair for doctors visit. Patient and wife endorse no significant change to his memory.  He is able to bathe, dress, feed himself. His wife manages finances and sets up medications. The medical assistants administer his medications. At his last virtual visit, I offered a trial of sinemet for gait and tremor, but there has been no change, with his balance. Tremor is less.   Medications:  Current Outpatient Medications on File Prior to Visit  Medication Sig Dispense Refill   atorvastatin (LIPITOR) 40 MG tablet Take 40 mg by mouth daily.     carbidopa-levodopa (SINEMET IR) 25-100 MG tablet Take 1 tablet twice daily 30-min prior to meals. 60 tablet  0   clopidogrel (PLAVIX) 75 MG tablet Place 1 tablet (75 mg total) into feeding tube daily. (Patient taking differently: Take 75 mg by mouth daily.) 30 tablet 0   donepezil (ARICEPT) 10 MG tablet TAKE 1 TABLET BY MOUTH EVERY DAY 14 tablet 0   levothyroxine (SYNTHROID, LEVOTHROID) 112 MCG tablet Take 112 mcg by mouth daily.  3   losartan (COZAAR) 25 MG tablet Take 25 mg by mouth daily.     tamsulosin (FLOMAX) 0.4 MG CAPS capsule Take 0.4 mg  by mouth daily.  3   thiamine 250 MG tablet Take 250 mg by mouth daily.      No current facility-administered medications on file prior to visit.    Allergies: No Known Allergies  Vital Signs:  BP (!) 174/87    Pulse 82    Ht 6\' 2"  (1.88 m)    Wt 178 lb (80.7 kg)    SpO2 98%    BMI 22.85 kg/m   Neurological Exam: MENTAL STATUS:  Alert, awake, and easily engages in conversation. Well groomed and dressed. Oriented x5. Speech is not dysarthric, but there is intermittent stuttering of speech (old).  Normal affect.  CRANIAL NERVES:  Pupils round and reactive to light.  Normal conjugate, extra-ocular eye movements in all directions of gaze.  No ptosis. Face is symmetric. Palate elevates symmetrically.  Tongue is midline.   MOTOR:  Motor strength is 5/5 in all extremities. Normal tone. No tremor.   REFLEXES: Reflexes are 2+/4 throughout and absent in the ankles.  SENSORY:  Vibration is reduced at the ankles. Intact at the knees and elbows.  COORDINATION/GAIT:  Patient arrived wheelchair, gait not tested.   Data: US carotids 12/30/2012: Findings suggest 1-39% internal carotid artery stenosis bilaterally. Vertebral arteries are patent with antegrade flow.   MRI/A brain 12/29/2012:  1. Ventricular prominence is disproportionate to the degree of atrophy, suggesting normal pressure hydrocephalus.  2. No other acute intracranial abnormality. Specifically, there is no evidence for acute or subacute infarct.  3. Mild distal small vessel disease is evident on the MRA without significant proximal stenosis, aneurysm, or branch vessel occlusion.   48-hr holter:  NSR, rare PVCs  CT head 06/04/2014: Ventricular prominence similar to prior MR suggesting normal pressure hydrocephalus. No intracranial hemorrhage.  No CT evidence of large acute infarct. No intracranial mass or abnormal enhancement.  CTA head and neck 06/04/2014:   Calcified plaque with mild to slightly moderate narrowing cavernous  segment internal carotid artery bilaterally. Mild narrowing and irregularity M1 segment left middle cerebral artery. Mild narrowing distal left vertebral artery.  MRI lumbar spine wo contrast 09/30/2014: 1. Minimal left foraminal narrowing at L4-5 and L5-S1 secondary to leftward disc protrusions and facet hypertrophy. 2. Leftward disc protrusion at L3-4 without significant stenosis.   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  Lab Results  Component Value Date   TSH 6.559 (H) 05/15/2017   Lab Results  Component Value Date   VITAMINB12 407 03/06/2017    IMPRESSION: Gait abnormality. Gait pattern is not typical for sensory ataxia, as this tends to be wide-based, his pattern in the past was shuffling and ataxic.  Previous evaluation for NPH was negative. No significant change with trial of sinemet, so I will recommend stopping it.  If his tremor returns or gait get worse, we can restart.  His gait  has deteriorated and now he is primary using a powerchair or walker. I offered PT to help with conditioning, he will talk to the therapist at [facility].  Alzheimer's dementia, mild. No significant progression. Confirmed by neuropsychological testing from 2018 and 2021.   He requires assistance with IADLs, still independent with ADLs.  Continue donepezil 10mg  daily  3.  Alcoholic peripheral neuropathy causing sensory ataxia.  Patient educated on daily foot inspection, fall prevention, and safety precautions around the home.  Encouraged him to abstain from alcohol.  4.  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  Continue plavix 75mg  daily, statin therapy, and BP medication for secondary prevention.  Return to clinic in 1 year.  Total time spent  reviewing records, interview, history/exam, documentation, counseling, and coordination of care on day of encounter:  30 min   Thank you for allowing me to participate in patient's care.  If I can answer any additional questions, I would be pleased to do so.    Sincerely,    Ramces Shomaker K. Posey Pronto, DO

## 2020-12-16 DIAGNOSIS — Z20828 Contact with and (suspected) exposure to other viral communicable diseases: Secondary | ICD-10-CM | POA: Diagnosis not present

## 2020-12-16 DIAGNOSIS — Z1159 Encounter for screening for other viral diseases: Secondary | ICD-10-CM | POA: Diagnosis not present

## 2020-12-19 DIAGNOSIS — Z1159 Encounter for screening for other viral diseases: Secondary | ICD-10-CM | POA: Diagnosis not present

## 2020-12-19 DIAGNOSIS — Z20828 Contact with and (suspected) exposure to other viral communicable diseases: Secondary | ICD-10-CM | POA: Diagnosis not present

## 2020-12-21 DIAGNOSIS — Z1159 Encounter for screening for other viral diseases: Secondary | ICD-10-CM | POA: Diagnosis not present

## 2020-12-21 DIAGNOSIS — Z20828 Contact with and (suspected) exposure to other viral communicable diseases: Secondary | ICD-10-CM | POA: Diagnosis not present

## 2020-12-26 DIAGNOSIS — Z1159 Encounter for screening for other viral diseases: Secondary | ICD-10-CM | POA: Diagnosis not present

## 2020-12-26 DIAGNOSIS — Z20828 Contact with and (suspected) exposure to other viral communicable diseases: Secondary | ICD-10-CM | POA: Diagnosis not present

## 2020-12-28 DIAGNOSIS — Z1159 Encounter for screening for other viral diseases: Secondary | ICD-10-CM | POA: Diagnosis not present

## 2020-12-28 DIAGNOSIS — Z20828 Contact with and (suspected) exposure to other viral communicable diseases: Secondary | ICD-10-CM | POA: Diagnosis not present

## 2021-01-02 DIAGNOSIS — Z20822 Contact with and (suspected) exposure to covid-19: Secondary | ICD-10-CM | POA: Diagnosis not present

## 2021-01-04 DIAGNOSIS — Z20828 Contact with and (suspected) exposure to other viral communicable diseases: Secondary | ICD-10-CM | POA: Diagnosis not present

## 2021-01-04 DIAGNOSIS — Z1159 Encounter for screening for other viral diseases: Secondary | ICD-10-CM | POA: Diagnosis not present

## 2021-01-09 DIAGNOSIS — Z20822 Contact with and (suspected) exposure to covid-19: Secondary | ICD-10-CM | POA: Diagnosis not present

## 2021-01-11 DIAGNOSIS — Z1159 Encounter for screening for other viral diseases: Secondary | ICD-10-CM | POA: Diagnosis not present

## 2021-01-11 DIAGNOSIS — Z20828 Contact with and (suspected) exposure to other viral communicable diseases: Secondary | ICD-10-CM | POA: Diagnosis not present

## 2021-01-16 DIAGNOSIS — Z20822 Contact with and (suspected) exposure to covid-19: Secondary | ICD-10-CM | POA: Diagnosis not present

## 2021-01-23 DIAGNOSIS — Z20828 Contact with and (suspected) exposure to other viral communicable diseases: Secondary | ICD-10-CM | POA: Diagnosis not present

## 2021-01-30 DIAGNOSIS — Z20822 Contact with and (suspected) exposure to covid-19: Secondary | ICD-10-CM | POA: Diagnosis not present

## 2021-02-03 DIAGNOSIS — Z20828 Contact with and (suspected) exposure to other viral communicable diseases: Secondary | ICD-10-CM | POA: Diagnosis not present

## 2021-02-06 DIAGNOSIS — Z20822 Contact with and (suspected) exposure to covid-19: Secondary | ICD-10-CM | POA: Diagnosis not present

## 2021-02-20 DIAGNOSIS — Z20822 Contact with and (suspected) exposure to covid-19: Secondary | ICD-10-CM | POA: Diagnosis not present

## 2021-02-27 DIAGNOSIS — Z8616 Personal history of COVID-19: Secondary | ICD-10-CM | POA: Diagnosis not present

## 2021-03-06 DIAGNOSIS — Z20822 Contact with and (suspected) exposure to covid-19: Secondary | ICD-10-CM | POA: Diagnosis not present

## 2021-03-13 DIAGNOSIS — Z20822 Contact with and (suspected) exposure to covid-19: Secondary | ICD-10-CM | POA: Diagnosis not present

## 2021-03-20 DIAGNOSIS — Z20822 Contact with and (suspected) exposure to covid-19: Secondary | ICD-10-CM | POA: Diagnosis not present

## 2021-03-27 DIAGNOSIS — Z20822 Contact with and (suspected) exposure to covid-19: Secondary | ICD-10-CM | POA: Diagnosis not present

## 2021-03-29 DIAGNOSIS — R350 Frequency of micturition: Secondary | ICD-10-CM | POA: Diagnosis not present

## 2021-03-29 DIAGNOSIS — R5383 Other fatigue: Secondary | ICD-10-CM | POA: Diagnosis not present

## 2021-03-29 DIAGNOSIS — R410 Disorientation, unspecified: Secondary | ICD-10-CM | POA: Diagnosis not present

## 2021-03-29 DIAGNOSIS — R059 Cough, unspecified: Secondary | ICD-10-CM | POA: Diagnosis not present

## 2021-03-29 DIAGNOSIS — R531 Weakness: Secondary | ICD-10-CM | POA: Diagnosis not present

## 2021-04-03 DIAGNOSIS — Z20822 Contact with and (suspected) exposure to covid-19: Secondary | ICD-10-CM | POA: Diagnosis not present

## 2021-04-10 DIAGNOSIS — Z20822 Contact with and (suspected) exposure to covid-19: Secondary | ICD-10-CM | POA: Diagnosis not present

## 2021-04-17 DIAGNOSIS — Z20822 Contact with and (suspected) exposure to covid-19: Secondary | ICD-10-CM | POA: Diagnosis not present

## 2021-04-24 DIAGNOSIS — Z20822 Contact with and (suspected) exposure to covid-19: Secondary | ICD-10-CM | POA: Diagnosis not present

## 2021-04-25 DIAGNOSIS — Z0000 Encounter for general adult medical examination without abnormal findings: Secondary | ICD-10-CM | POA: Diagnosis not present

## 2021-04-25 DIAGNOSIS — Z1331 Encounter for screening for depression: Secondary | ICD-10-CM | POA: Diagnosis not present

## 2021-04-28 DIAGNOSIS — Z20822 Contact with and (suspected) exposure to covid-19: Secondary | ICD-10-CM | POA: Diagnosis not present

## 2021-05-01 DIAGNOSIS — Z20822 Contact with and (suspected) exposure to covid-19: Secondary | ICD-10-CM | POA: Diagnosis not present

## 2021-05-08 DIAGNOSIS — Z20822 Contact with and (suspected) exposure to covid-19: Secondary | ICD-10-CM | POA: Diagnosis not present

## 2021-05-11 DIAGNOSIS — E039 Hypothyroidism, unspecified: Secondary | ICD-10-CM | POA: Diagnosis not present

## 2021-09-28 DIAGNOSIS — Z23 Encounter for immunization: Secondary | ICD-10-CM | POA: Diagnosis not present

## 2021-11-24 ENCOUNTER — Other Ambulatory Visit: Payer: Self-pay | Admitting: Neurology

## 2021-12-18 ENCOUNTER — Telehealth (INDEPENDENT_AMBULATORY_CARE_PROVIDER_SITE_OTHER): Payer: Medicare Other | Admitting: Neurology

## 2021-12-18 ENCOUNTER — Encounter: Payer: Self-pay | Admitting: Neurology

## 2021-12-18 VITALS — Ht 72.0 in | Wt 160.0 lb

## 2021-12-18 DIAGNOSIS — R269 Unspecified abnormalities of gait and mobility: Secondary | ICD-10-CM

## 2021-12-18 DIAGNOSIS — F0390 Unspecified dementia without behavioral disturbance: Secondary | ICD-10-CM

## 2021-12-18 DIAGNOSIS — F03A0 Unspecified dementia, mild, without behavioral disturbance, psychotic disturbance, mood disturbance, and anxiety: Secondary | ICD-10-CM

## 2021-12-18 DIAGNOSIS — G621 Alcoholic polyneuropathy: Secondary | ICD-10-CM | POA: Diagnosis not present

## 2021-12-18 NOTE — Progress Notes (Signed)
   Virtual Visit via Video Note The purpose of this virtual visit is to provide medical care while limiting exposure to the novel coronavirus.    Consent was obtained for video visit:  Yes.   Answered questions that patient had about telehealth interaction:  Yes.   I discussed the limitations, risks, security and privacy concerns of performing an evaluation and management service by telemedicine. I also discussed with the patient that there may be a patient responsible charge related to this service. The patient expressed understanding and agreed to proceed.  Pt location: Home Physician Location: office Name of referring provider:  Seward Carol, MD I connected with Dylan Moreno at patients initiation/request on 12/18/2021 at  2:30 PM EST by video enabled telemedicine application and verified that I am speaking with the correct person using two identifiers. Pt MRN:  [id number] Pt DOB:  [birth year] Video Participants:  Dylan Moreno;  [name])   History of Present Illness: This is a 81 y.o. male returning for follow-up of of gait instability and Alzheimer's dementia.  In January, he moved into assisted living at [facility].  He continues to have difficulty with balance and gait and now uses a powerchair at all times.  He is able to transfer himself and denies any weakness.  Mood is good.  His family have weaned his alcohol consumption and now he rarely drinks.  They did notice improvement in his memory and behavior with this.  Son says that he repeats himself frequently and forgets details of conversation, but there has been no significant change over the last year.  No new complaints.    Observations/Objective:   There were no vitals filed for this visit.  Patient is awake, alert, and appears comfortable.  Easily engages in conversation and answers questions appropriately.  Upcoming holiday is Christmas.  Month is December, unsure of year.  No ptosis.  Face is symmetric.  Speech is not  dysarthric.  Antigravity in all extremities.  No pronator drift.    Gait no tested.   Assessment and Plan:  Gait abnormality. Gait pattern is not typical for sensory ataxia, as this tends to be wide-based, his pattern in the past was shuffling and ataxic.  Previous evaluation for NPH was negative. No significant change with trial of sinemet, so I will recommend stopping it.  Continue to use power chair.  Alzheimer's dementia, mild. Mild progression as expected.  He is now living in assisted living.  Confirmed by neuropsychological testing from 2018 and 2021.   Continue donepezil '10mg'$  daily  3.  Alcoholic peripheral neuropathy causing sensory ataxia.  Praised patient on reducing alcohol and encouraged to try to abstain.  4.  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  Continue plavix '75mg'$  daily, statin therapy, and BP medication for secondary prevention.  Follow Up Instructions:   I discussed the assessment and treatment plan with the patient. The patient was provided an opportunity to ask questions and all were answered. The patient agreed with the plan and demonstrated an understanding of the instructions.   The patient was advised to call back or seek an in-person evaluation if the symptoms worsen or if the condition fails to improve as anticipated.  Follow-up in 1 year    Alda Berthold, DO

## 2022-03-26 ENCOUNTER — Emergency Department (HOSPITAL_COMMUNITY)
Admission: EM | Admit: 2022-03-26 | Discharge: 2022-03-26 | Disposition: A | Discharge status: Skilled Nursing Facility | Payer: Medicare Other | Source: Home / Self Care | Attending: Emergency Medicine | Admitting: Emergency Medicine

## 2022-03-26 ENCOUNTER — Emergency Department (HOSPITAL_COMMUNITY): Payer: Medicare Other

## 2022-03-26 ENCOUNTER — Encounter (HOSPITAL_COMMUNITY): Payer: Self-pay

## 2022-03-26 ENCOUNTER — Other Ambulatory Visit: Payer: Self-pay

## 2022-03-26 DIAGNOSIS — Z7401 Bed confinement status: Secondary | ICD-10-CM | POA: Diagnosis not present

## 2022-03-26 DIAGNOSIS — Z79890 Hormone replacement therapy (postmenopausal): Secondary | ICD-10-CM | POA: Diagnosis not present

## 2022-03-26 DIAGNOSIS — Z043 Encounter for examination and observation following other accident: Secondary | ICD-10-CM | POA: Diagnosis not present

## 2022-03-26 DIAGNOSIS — H9190 Unspecified hearing loss, unspecified ear: Secondary | ICD-10-CM | POA: Diagnosis present

## 2022-03-26 DIAGNOSIS — Z923 Personal history of irradiation: Secondary | ICD-10-CM | POA: Diagnosis not present

## 2022-03-26 DIAGNOSIS — R531 Weakness: Secondary | ICD-10-CM | POA: Diagnosis not present

## 2022-03-26 DIAGNOSIS — U071 COVID-19: Secondary | ICD-10-CM | POA: Diagnosis not present

## 2022-03-26 DIAGNOSIS — Z0000 Encounter for general adult medical examination without abnormal findings: Secondary | ICD-10-CM | POA: Insufficient documentation

## 2022-03-26 DIAGNOSIS — I959 Hypotension, unspecified: Secondary | ICD-10-CM | POA: Diagnosis not present

## 2022-03-26 DIAGNOSIS — R000 Tachycardia, unspecified: Secondary | ICD-10-CM | POA: Diagnosis not present

## 2022-03-26 DIAGNOSIS — I10 Essential (primary) hypertension: Secondary | ICD-10-CM | POA: Diagnosis not present

## 2022-03-26 DIAGNOSIS — Z7902 Long term (current) use of antithrombotics/antiplatelets: Secondary | ICD-10-CM | POA: Diagnosis not present

## 2022-03-26 DIAGNOSIS — R112 Nausea with vomiting, unspecified: Secondary | ICD-10-CM | POA: Diagnosis not present

## 2022-03-26 DIAGNOSIS — W19XXXA Unspecified fall, initial encounter: Secondary | ICD-10-CM | POA: Diagnosis not present

## 2022-03-26 DIAGNOSIS — J9601 Acute respiratory failure with hypoxia: Secondary | ICD-10-CM | POA: Diagnosis not present

## 2022-03-26 DIAGNOSIS — R4182 Altered mental status, unspecified: Secondary | ICD-10-CM | POA: Diagnosis not present

## 2022-03-26 DIAGNOSIS — Z79899 Other long term (current) drug therapy: Secondary | ICD-10-CM | POA: Diagnosis not present

## 2022-03-26 DIAGNOSIS — F0390 Unspecified dementia without behavioral disturbance: Secondary | ICD-10-CM | POA: Diagnosis not present

## 2022-03-26 DIAGNOSIS — Z87442 Personal history of urinary calculi: Secondary | ICD-10-CM | POA: Diagnosis not present

## 2022-03-26 DIAGNOSIS — E7800 Pure hypercholesterolemia, unspecified: Secondary | ICD-10-CM | POA: Diagnosis present

## 2022-03-26 DIAGNOSIS — R410 Disorientation, unspecified: Secondary | ICD-10-CM | POA: Diagnosis not present

## 2022-03-26 DIAGNOSIS — S0081XA Abrasion of other part of head, initial encounter: Secondary | ICD-10-CM | POA: Diagnosis present

## 2022-03-26 DIAGNOSIS — W1830XA Fall on same level, unspecified, initial encounter: Secondary | ICD-10-CM | POA: Insufficient documentation

## 2022-03-26 DIAGNOSIS — J1282 Pneumonia due to coronavirus disease 2019: Secondary | ICD-10-CM | POA: Diagnosis present

## 2022-03-26 DIAGNOSIS — J189 Pneumonia, unspecified organism: Secondary | ICD-10-CM | POA: Diagnosis not present

## 2022-03-26 DIAGNOSIS — Z8249 Family history of ischemic heart disease and other diseases of the circulatory system: Secondary | ICD-10-CM | POA: Diagnosis not present

## 2022-03-26 DIAGNOSIS — A419 Sepsis, unspecified organism: Secondary | ICD-10-CM | POA: Diagnosis not present

## 2022-03-26 DIAGNOSIS — R0902 Hypoxemia: Secondary | ICD-10-CM | POA: Diagnosis not present

## 2022-03-26 DIAGNOSIS — Z87891 Personal history of nicotine dependence: Secondary | ICD-10-CM | POA: Diagnosis not present

## 2022-03-26 DIAGNOSIS — Z85810 Personal history of malignant neoplasm of tongue: Secondary | ICD-10-CM | POA: Diagnosis not present

## 2022-03-26 DIAGNOSIS — R1111 Vomiting without nausea: Secondary | ICD-10-CM | POA: Diagnosis not present

## 2022-03-26 DIAGNOSIS — Z8673 Personal history of transient ischemic attack (TIA), and cerebral infarction without residual deficits: Secondary | ICD-10-CM | POA: Diagnosis not present

## 2022-03-26 DIAGNOSIS — E039 Hypothyroidism, unspecified: Secondary | ICD-10-CM | POA: Diagnosis not present

## 2022-03-26 DIAGNOSIS — Y92129 Unspecified place in nursing home as the place of occurrence of the external cause: Secondary | ICD-10-CM | POA: Diagnosis not present

## 2022-03-26 LAB — I-STAT CHEM 8, ED
BUN: 23 mg/dL (ref 8–23)
Calcium, Ion: 1 mmol/L — ABNORMAL LOW (ref 1.15–1.40)
Chloride: 107 mmol/L (ref 98–111)
Creatinine, Ser: 1.3 mg/dL — ABNORMAL HIGH (ref 0.61–1.24)
Glucose, Bld: 134 mg/dL — ABNORMAL HIGH (ref 70–99)
HCT: 41 % (ref 39.0–52.0)
Hemoglobin: 13.9 g/dL (ref 13.0–17.0)
Potassium: 4.2 mmol/L (ref 3.5–5.1)
Sodium: 138 mmol/L (ref 135–145)
TCO2: 23 mmol/L (ref 22–32)

## 2022-03-26 LAB — CBC WITH DIFFERENTIAL/PLATELET
Abs Immature Granulocytes: 0.1 10*3/uL — ABNORMAL HIGH (ref 0.00–0.07)
Basophils Absolute: 0 10*3/uL (ref 0.0–0.1)
Basophils Relative: 0 %
Eosinophils Absolute: 0 10*3/uL (ref 0.0–0.5)
Eosinophils Relative: 0 %
HCT: 43.3 % (ref 39.0–52.0)
Hemoglobin: 13.7 g/dL (ref 13.0–17.0)
Immature Granulocytes: 1 %
Lymphocytes Relative: 4 %
Lymphs Abs: 0.4 10*3/uL — ABNORMAL LOW (ref 0.7–4.0)
MCH: 29.3 pg (ref 26.0–34.0)
MCHC: 31.6 g/dL (ref 30.0–36.0)
MCV: 92.5 fL (ref 80.0–100.0)
Monocytes Absolute: 0.7 10*3/uL (ref 0.1–1.0)
Monocytes Relative: 6 %
Neutro Abs: 10.1 10*3/uL — ABNORMAL HIGH (ref 1.7–7.7)
Neutrophils Relative %: 89 %
Platelets: 195 10*3/uL (ref 150–400)
RBC: 4.68 MIL/uL (ref 4.22–5.81)
RDW: 13.2 % (ref 11.5–15.5)
WBC: 11.3 10*3/uL — ABNORMAL HIGH (ref 4.0–10.5)
nRBC: 0 % (ref 0.0–0.2)

## 2022-03-26 LAB — URINALYSIS, ROUTINE W REFLEX MICROSCOPIC
Bacteria, UA: NONE SEEN
Bilirubin Urine: NEGATIVE
Glucose, UA: NEGATIVE mg/dL
Hgb urine dipstick: NEGATIVE
Ketones, ur: 20 mg/dL — AB
Nitrite: NEGATIVE
Protein, ur: NEGATIVE mg/dL
Specific Gravity, Urine: 1.019 (ref 1.005–1.030)
pH: 5 (ref 5.0–8.0)

## 2022-03-26 LAB — COMPREHENSIVE METABOLIC PANEL
ALT: 31 U/L (ref 0–44)
AST: 23 U/L (ref 15–41)
Albumin: 3.7 g/dL (ref 3.5–5.0)
Alkaline Phosphatase: 74 U/L (ref 38–126)
Anion gap: 12 (ref 5–15)
BUN: 19 mg/dL (ref 8–23)
CO2: 21 mmol/L — ABNORMAL LOW (ref 22–32)
Calcium: 8.9 mg/dL (ref 8.9–10.3)
Chloride: 105 mmol/L (ref 98–111)
Creatinine, Ser: 1.33 mg/dL — ABNORMAL HIGH (ref 0.61–1.24)
GFR, Estimated: 54 mL/min — ABNORMAL LOW (ref >60)
Glucose, Bld: 134 mg/dL — ABNORMAL HIGH (ref 70–99)
Potassium: 4.2 mmol/L (ref 3.5–5.1)
Sodium: 138 mmol/L (ref 135–145)
Total Bilirubin: 1.9 mg/dL — ABNORMAL HIGH (ref 0.3–1.2)
Total Protein: 6.5 g/dL (ref 6.5–8.1)

## 2022-03-26 LAB — TROPONIN I (HIGH SENSITIVITY)
Troponin I (High Sensitivity): 10 ng/L (ref <18)
Troponin I (High Sensitivity): 15 ng/L (ref <18)

## 2022-03-26 MED ORDER — LACTATED RINGERS IV SOLN: INTRAVENOUS | Status: DC

## 2022-03-26 MED ORDER — ACETAMINOPHEN 325 MG PO TABS
650.0000 mg | ORAL_TABLET | Freq: Once | ORAL | Status: AC
  Administered 2022-03-26: 650 mg via ORAL
  Filled 2022-03-26: qty 2

## 2022-03-26 MED ORDER — LACTATED RINGERS IV BOLUS
1000.0000 mL | Freq: Once | INTRAVENOUS | Status: AC
  Administered 2022-03-26: 1000 mL via INTRAVENOUS

## 2022-03-26 NOTE — ED Provider Notes (Addendum)
[facility] Provider Note   CSN: [id number] Arrival date & time: 03/26/22  1531     History  Chief Complaint  Patient presents with   level 2 fall    Dylan Moreno is a 82 y.o. male.  82 year old male here after witnessed fall from nursing facility.  Patient is on Plavix.  Patient states he has no idea how he got on the ground.  Some history of dementia in the past.  States he has no complaints at this time.  EMS called and patient placed in cervical collar.  Blood pressure was noted to be in the high 90s.  Patient denies any recent history of illness.  He does not have a headache.  Was noted to have an abrasion on his face.       Home Medications Prior to Admission medications   Medication Sig Start Date End Date Taking? Authorizing Provider  atorvastatin (LIPITOR) 40 MG tablet Take 40 mg by mouth daily. 08/26/18   [provider]  clopidogrel (PLAVIX) 75 MG tablet Place 1 tablet (75 mg total) into feeding tube daily. Patient taking differently: Take 75 mg by mouth daily. 06/03/14   Janece Canterbury, MD  donepezil (ARICEPT) 10 MG tablet TAKE 1 TABLET BY MOUTH EVERY DAY 11/27/21   Narda Amber K, DO  levothyroxine (SYNTHROID, LEVOTHROID) 112 MCG tablet Take 112 mcg by mouth daily. 10/14/17   [provider]  losartan (COZAAR) 25 MG tablet Take 25 mg by mouth daily. 08/18/18   [provider]  tamsulosin (FLOMAX) 0.4 MG CAPS capsule Take 0.4 mg by mouth daily. 05/11/16   [provider]  thiamine 250 MG tablet Take 250 mg by mouth daily.     [provider]      Allergies    Patient has no known allergies.    Review of Systems   Review of Systems  All other systems reviewed and are negative.   Physical Exam Updated Vital Signs There were no vitals taken for this visit. Physical Exam Vitals and nursing note reviewed.  Constitutional:      General: He is not in acute distress.     Appearance: Normal appearance. He is well-developed. He is not toxic-appearing.  HENT:     Head: Normocephalic and atraumatic.  Eyes:     General: Lids are normal.     Conjunctiva/sclera: Conjunctivae normal.     Pupils: Pupils are equal, round, and reactive to light.  Neck:     Thyroid: No thyroid mass.     Trachea: No tracheal deviation.  Cardiovascular:     Rate and Rhythm: Normal rate and regular rhythm.     Heart sounds: Normal heart sounds. No murmur heard.    No gallop.  Pulmonary:     Effort: Pulmonary effort is normal. No respiratory distress.     Breath sounds: Normal breath sounds. No stridor. No decreased breath sounds, wheezing, rhonchi or rales.  Abdominal:     General: There is no distension.     Palpations: Abdomen is soft.     Tenderness: There is no abdominal tenderness. There is no rebound.  Musculoskeletal:        General: No tenderness. Normal range of motion.     Cervical back: Normal range of motion and neck supple.  Skin:    General: Skin is warm and dry.     Findings: No abrasion or rash.  Neurological:     General: No  focal deficit present.     Mental Status: He is alert and oriented to person, place, and time. Mental status is at baseline.     GCS: GCS eye subscore is 4. GCS verbal subscore is 5. GCS motor subscore is 6.     Cranial Nerves: Cranial nerves 2-12 are intact. No cranial nerve deficit.     Sensory: No sensory deficit.     Motor: Motor function is intact.     Comments: Strength 5 of 5 in upper as well as lower extremities  Psychiatric:        Attention and Perception: Attention normal.        Speech: Speech normal.        Behavior: Behavior normal.     ED Results / Procedures / Treatments   Labs (all labs ordered are listed, but only abnormal results are displayed) Labs Reviewed  CBC WITH DIFFERENTIAL/PLATELET  COMPREHENSIVE METABOLIC PANEL  TROPONIN I (HIGH SENSITIVITY)    EKG None  Radiology No results  found.  Procedures Procedures    Medications Ordered in ED Medications  lactated ringers infusion (has no administration in time range)  lactated ringers bolus 1,000 mL (has no administration in time range)    ED Course/ Medical Decision Making/ A&P                             Medical Decision Making Amount and/or Complexity of Data Reviewed Labs: ordered. Radiology: ordered. ECG/medicine tests: ordered.  Risk OTC drugs. Prescription drug management.  Patient low-grade temperature here and treat with Tylenol.  No evidence of sepsis at this time.  Chest x-ray per interpretation was negative.  Urinalysis also negative for infection 2. Patient is EKG per interpretation shows no acute ischemic changes.  Patient unwitnessed fall.  Unsure if he had syncope.  Does have history of dementia.  CT of head and cervical spine per my interpretation showed no acute findings.  Patient troponins are negative here.  Low suspicion for cardiac event.  No complaints at this time.  His neurological exam is stable.  Will discharge        Final Clinical Impression(s) / ED Diagnoses Final diagnoses:  None    Rx / DC Orders ED Discharge Orders     None         Lacretia Leigh, MD 03/26/22 Paulette Blanch    Lacretia Leigh, MD 03/26/22 2014

## 2022-03-26 NOTE — Progress Notes (Signed)
Chaplain responded to Level II page. Pt out of room. Pt visitor was present, but was on the phone. Chaplain remains available.  Please page as further needs arise.  Donald Prose. Elyn Peers, M.Div. [facility] Chaplain Pager [phone] Office [phone]

## 2022-03-26 NOTE — ED Notes (Signed)
PLEASE CALL PT'S WIFE WITH UPDATE WHEN PT IS DC'D

## 2022-03-26 NOTE — ED Notes (Addendum)
Patient transported to CT by this RN 

## 2022-03-26 NOTE — Progress Notes (Signed)
Orthopedic Tech Progress Note Patient Details:  Dylan Moreno 29-Jan-1940 [id number]  Level 2 trauma   Patient ID: Dylan Moreno, male   DOB: 07/31/1940, 82 y.o.   MRN: [id number]  Dylan Moreno 03/26/2022, 4:21 PM

## 2022-03-26 NOTE — ED Notes (Signed)
Need urine

## 2022-03-26 NOTE — ED Notes (Signed)
Trauma Response Nurse Documentation  Dylan Moreno is a 82 y.o. male arriving to [facility] ED via EMS  On clopidogrel 75 mg daily. Trauma was activated as a Level 2 based on the following trauma criteria Elderly patients > 65 with head trauma on anti-coagulation (excluding ASA). Trauma team at the bedside on patient arrival.   Patient cleared for CT by Dr. Zenia Resides. Pt transported to CT with ED nurse present to monitor. RN remained with the patient throughout their absence from the department for clinical observation.   GCS 14, baseline dementia.  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   Cerebral ventriculomegaly 06/08/2014   Dysphagia 06/17/2014   Dysuria 07/15/2014   GERD (gastroesophageal reflux disease)    History of radiation therapy 04/06/14- 05/25/14   Base of tongue and bilateral neck 70 Gy in 35 fractions to gross disease, 63 Gy in 35 Fractions to high risk nodal echelons, and 56 Gy in 35 fractions to intermediate risk nodal echelons   Hypercholesterolemia    Hyperlipemia    Hypertension    Hyponatremia    Kidney stones    Leukopenia due to antineoplastic chemotherapy 04/28/2014   Major neurocognitive disorder ([place]) 01/22/2019   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   Mild cognitive impairment with memory loss 12/22/2014   Mild to moderate hearing loss 03/18/2014   Peyronie's disease    Subacute delirium 05/25/2014   TIA (transient ischemic attack) 12/29/12   left facial droop     Past Surgical History:  Procedure Laterality Date   COLONOSCOPY     CYSTOSCOPY W/ URETEROSCOPY W/ LITHOTRIPSY  2011   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   Procedure: DUPUYTREN CONTRACTURE RELEASE RIGHT PALM,RING AND SMALL FINGER;  Surgeon:  Cammie Sickle., MD;  Location: [place];  Service: Orthopedics;  Laterality: Right;   HAND SURGERY     right   SHOULDER ARTHROSCOPY W/ ROTATOR CUFF REPAIR     left   TONSILLECTOMY       Initial Focused Assessment (If applicable, or please see trauma documentation): See documentation  CT's Completed:   CT Head and CT C-Spine   Interventions:  IV, labs CXR CT Head/Cspine  Plan for disposition:  pending  Event Summary: Patient from [facility] by [place] after an unwitnessed fall, takes plavix. C-collar placed by EMS. Imaging ordered and negative for traumatic injury. Plan to discharge from a trauma standpoint, pending medical work-up.  Bedside handoff with ED RN Alana.    [place] Camdyn Beske  Trauma Response RN  Please call TRN at [phone] for further assistance.

## 2022-03-26 NOTE — ED Triage Notes (Signed)
Pt from [facility] by [facility] for unwitnessed fall. Pt arrived w/c-collar on. Pt on plavix. Pt has dementia at baseline. Staff found pt on carpeted floor. Pt states doesn't remeber falling. Pt denies pain. Pt vomited twice for staff. EMS gave NS 512mL and zofran 4 mg IV.

## 2022-03-27 ENCOUNTER — Inpatient Hospital Stay (HOSPITAL_COMMUNITY)
Admission: EM | Admit: 2022-03-27 | Discharge: 2022-03-29 | DRG: 177 | Disposition: A | Discharge status: Home Health | Payer: Medicare Other | Source: Skilled Nursing Facility | Attending: Internal Medicine | Admitting: Internal Medicine

## 2022-03-27 ENCOUNTER — Emergency Department (HOSPITAL_COMMUNITY): Payer: Medicare Other

## 2022-03-27 DIAGNOSIS — S0081XA Abrasion of other part of head, initial encounter: Secondary | ICD-10-CM | POA: Diagnosis present

## 2022-03-27 DIAGNOSIS — R1111 Vomiting without nausea: Secondary | ICD-10-CM | POA: Diagnosis not present

## 2022-03-27 DIAGNOSIS — Z923 Personal history of irradiation: Secondary | ICD-10-CM

## 2022-03-27 DIAGNOSIS — Z85810 Personal history of malignant neoplasm of tongue: Secondary | ICD-10-CM

## 2022-03-27 DIAGNOSIS — R000 Tachycardia, unspecified: Secondary | ICD-10-CM | POA: Diagnosis not present

## 2022-03-27 DIAGNOSIS — Z8673 Personal history of transient ischemic attack (TIA), and cerebral infarction without residual deficits: Secondary | ICD-10-CM

## 2022-03-27 DIAGNOSIS — R112 Nausea with vomiting, unspecified: Secondary | ICD-10-CM | POA: Diagnosis present

## 2022-03-27 DIAGNOSIS — I959 Hypotension, unspecified: Secondary | ICD-10-CM | POA: Diagnosis not present

## 2022-03-27 DIAGNOSIS — R0902 Hypoxemia: Secondary | ICD-10-CM | POA: Diagnosis not present

## 2022-03-27 DIAGNOSIS — I10 Essential (primary) hypertension: Secondary | ICD-10-CM | POA: Insufficient documentation

## 2022-03-27 DIAGNOSIS — H9190 Unspecified hearing loss, unspecified ear: Secondary | ICD-10-CM | POA: Diagnosis present

## 2022-03-27 DIAGNOSIS — W19XXXA Unspecified fall, initial encounter: Secondary | ICD-10-CM | POA: Diagnosis present

## 2022-03-27 DIAGNOSIS — E039 Hypothyroidism, unspecified: Secondary | ICD-10-CM | POA: Diagnosis present

## 2022-03-27 DIAGNOSIS — U071 COVID-19: Secondary | ICD-10-CM | POA: Diagnosis not present

## 2022-03-27 DIAGNOSIS — Z79899 Other long term (current) drug therapy: Secondary | ICD-10-CM

## 2022-03-27 DIAGNOSIS — J1282 Pneumonia due to coronavirus disease 2019: Secondary | ICD-10-CM | POA: Diagnosis present

## 2022-03-27 DIAGNOSIS — Y92129 Unspecified place in nursing home as the place of occurrence of the external cause: Secondary | ICD-10-CM

## 2022-03-27 DIAGNOSIS — J9601 Acute respiratory failure with hypoxia: Secondary | ICD-10-CM | POA: Diagnosis not present

## 2022-03-27 DIAGNOSIS — Z87891 Personal history of nicotine dependence: Secondary | ICD-10-CM

## 2022-03-27 DIAGNOSIS — A419 Sepsis, unspecified organism: Secondary | ICD-10-CM | POA: Diagnosis not present

## 2022-03-27 DIAGNOSIS — F0390 Unspecified dementia without behavioral disturbance: Secondary | ICD-10-CM | POA: Diagnosis present

## 2022-03-27 DIAGNOSIS — Z79890 Hormone replacement therapy (postmenopausal): Secondary | ICD-10-CM

## 2022-03-27 DIAGNOSIS — Z8249 Family history of ischemic heart disease and other diseases of the circulatory system: Secondary | ICD-10-CM

## 2022-03-27 DIAGNOSIS — Z87442 Personal history of urinary calculi: Secondary | ICD-10-CM

## 2022-03-27 DIAGNOSIS — E785 Hyperlipidemia, unspecified: Secondary | ICD-10-CM | POA: Diagnosis present

## 2022-03-27 DIAGNOSIS — J189 Pneumonia, unspecified organism: Secondary | ICD-10-CM | POA: Diagnosis not present

## 2022-03-27 DIAGNOSIS — E7800 Pure hypercholesterolemia, unspecified: Secondary | ICD-10-CM | POA: Diagnosis present

## 2022-03-27 DIAGNOSIS — Z7902 Long term (current) use of antithrombotics/antiplatelets: Secondary | ICD-10-CM

## 2022-03-27 LAB — PROTIME-INR
INR: 1.1 (ref 0.8–1.2)
Prothrombin Time: 14.3 seconds (ref 11.4–15.2)

## 2022-03-27 LAB — LACTIC ACID, PLASMA
Lactic Acid, Venous: 2.4 mmol/L (ref 0.5–1.9)
Lactic Acid, Venous: 1.4 mmol/L (ref 0.5–1.9)

## 2022-03-27 LAB — COMPREHENSIVE METABOLIC PANEL
ALT: 27 U/L (ref 0–44)
AST: 23 U/L (ref 15–41)
Albumin: 4.2 g/dL (ref 3.5–5.0)
Alkaline Phosphatase: 72 U/L (ref 38–126)
Anion gap: 11 (ref 5–15)
BUN: 23 mg/dL (ref 8–23)
CO2: 26 mmol/L (ref 22–32)
Calcium: 9.1 mg/dL (ref 8.9–10.3)
Chloride: 103 mmol/L (ref 98–111)
Creatinine, Ser: 1.15 mg/dL (ref 0.61–1.24)
GFR, Estimated: >60 mL/min (ref >60)
Glucose, Bld: 127 mg/dL — ABNORMAL HIGH (ref 70–99)
Potassium: 4.4 mmol/L (ref 3.5–5.1)
Sodium: 140 mmol/L (ref 135–145)
Total Bilirubin: 2.4 mg/dL — ABNORMAL HIGH (ref 0.3–1.2)
Total Protein: 7.3 g/dL (ref 6.5–8.1)

## 2022-03-27 LAB — CBC WITH DIFFERENTIAL/PLATELET
Abs Immature Granulocytes: 0.07 10*3/uL (ref 0.00–0.07)
Basophils Absolute: 0 10*3/uL (ref 0.0–0.1)
Basophils Relative: 0 %
Eosinophils Absolute: 0 10*3/uL (ref 0.0–0.5)
Eosinophils Relative: 0 %
HCT: 43.2 % (ref 39.0–52.0)
Hemoglobin: 14 g/dL (ref 13.0–17.0)
Immature Granulocytes: 1 %
Lymphocytes Relative: 4 %
Lymphs Abs: 0.5 10*3/uL — ABNORMAL LOW (ref 0.7–4.0)
MCH: 29.5 pg (ref 26.0–34.0)
MCHC: 32.4 g/dL (ref 30.0–36.0)
MCV: 91.1 fL (ref 80.0–100.0)
Monocytes Absolute: 0.6 10*3/uL (ref 0.1–1.0)
Monocytes Relative: 5 %
Neutro Abs: 10.3 10*3/uL — ABNORMAL HIGH (ref 1.7–7.7)
Neutrophils Relative %: 90 %
Platelets: 199 10*3/uL (ref 150–400)
RBC: 4.74 MIL/uL (ref 4.22–5.81)
RDW: 13.2 % (ref 11.5–15.5)
WBC: 11.5 10*3/uL — ABNORMAL HIGH (ref 4.0–10.5)
nRBC: 0 % (ref 0.0–0.2)

## 2022-03-27 LAB — PROCALCITONIN: Procalcitonin: <0.1 ng/mL

## 2022-03-27 LAB — RESP PANEL BY RT-PCR (RSV, FLU A&B, COVID)  RVPGX2
Influenza A by PCR: NEGATIVE
Influenza B by PCR: NEGATIVE
Resp Syncytial Virus by PCR: NEGATIVE
SARS Coronavirus 2 by RT PCR: POSITIVE — AB

## 2022-03-27 LAB — APTT: aPTT: 30 seconds (ref 24–36)

## 2022-03-27 MED ORDER — POLYETHYLENE GLYCOL 3350 17 G PO PACK: 17.0000 g | PACK | Freq: Every day | ORAL | Status: DC | PRN

## 2022-03-27 MED ORDER — SODIUM CHLORIDE 0.9 % IV SOLN
2.0000 g | INTRAVENOUS | Status: DC
  Administered 2022-03-27: 2 g via INTRAVENOUS
  Filled 2022-03-27: qty 20

## 2022-03-27 MED ORDER — SODIUM CHLORIDE 0.9% FLUSH
3.0000 mL | Freq: Two times a day (BID) | INTRAVENOUS | Status: DC
  Administered 2022-03-27 – 2022-03-29 (×3): 3 mL via INTRAVENOUS

## 2022-03-27 MED ORDER — LEVOTHYROXINE SODIUM 112 MCG PO TABS
112.0000 ug | ORAL_TABLET | Freq: Every day | ORAL | Status: DC
  Administered 2022-03-28 – 2022-03-29 (×2): 112 ug via ORAL
  Filled 2022-03-27 (×2): qty 1

## 2022-03-27 MED ORDER — ACETAMINOPHEN 650 MG RE SUPP: 650.0000 mg | Freq: Four times a day (QID) | RECTAL | Status: DC | PRN

## 2022-03-27 MED ORDER — ENOXAPARIN SODIUM 40 MG/0.4ML IJ SOSY
40.0000 mg | PREFILLED_SYRINGE | Freq: Every day | INTRAMUSCULAR | Status: DC
  Administered 2022-03-28 – 2022-03-29 (×2): 40 mg via SUBCUTANEOUS
  Filled 2022-03-27 (×2): qty 0.4

## 2022-03-27 MED ORDER — ONDANSETRON HCL 4 MG/2ML IJ SOLN: 4.0000 mg | Freq: Four times a day (QID) | INTRAMUSCULAR | Status: DC | PRN

## 2022-03-27 MED ORDER — ATORVASTATIN CALCIUM 40 MG PO TABS
40.0000 mg | ORAL_TABLET | Freq: Every day | ORAL | Status: DC
  Administered 2022-03-28 – 2022-03-29 (×2): 40 mg via ORAL
  Filled 2022-03-27 (×2): qty 1

## 2022-03-27 MED ORDER — ACETAMINOPHEN 325 MG PO TABS
650.0000 mg | ORAL_TABLET | Freq: Once | ORAL | Status: AC
  Administered 2022-03-27: 650 mg via ORAL
  Filled 2022-03-27: qty 2

## 2022-03-27 MED ORDER — ONDANSETRON HCL 4 MG PO TABS: 4.0000 mg | ORAL_TABLET | Freq: Four times a day (QID) | ORAL | Status: DC | PRN

## 2022-03-27 MED ORDER — CLOPIDOGREL BISULFATE 75 MG PO TABS
75.0000 mg | ORAL_TABLET | Freq: Every day | ORAL | Status: DC
  Administered 2022-03-28 – 2022-03-29 (×2): 75 mg via ORAL
  Filled 2022-03-27 (×2): qty 1

## 2022-03-27 MED ORDER — DONEPEZIL HCL 5 MG PO TABS
10.0000 mg | ORAL_TABLET | Freq: Every day | ORAL | Status: DC
  Administered 2022-03-27 – 2022-03-28 (×2): 10 mg via ORAL
  Filled 2022-03-27 (×2): qty 2

## 2022-03-27 MED ORDER — SODIUM CHLORIDE 0.9 % IV SOLN
500.0000 mg | INTRAVENOUS | Status: DC
  Administered 2022-03-27: 500 mg via INTRAVENOUS
  Filled 2022-03-27: qty 5

## 2022-03-27 MED ORDER — ALBUTEROL SULFATE (2.5 MG/3ML) 0.083% IN NEBU: 2.5000 mg | INHALATION_SOLUTION | RESPIRATORY_TRACT | Status: DC | PRN

## 2022-03-27 MED ORDER — DEXAMETHASONE 4 MG PO TABS
6.0000 mg | ORAL_TABLET | ORAL | Status: DC
  Administered 2022-03-27 – 2022-03-28 (×2): 6 mg via ORAL
  Filled 2022-03-27 (×2): qty 1

## 2022-03-27 MED ORDER — LACTATED RINGERS IV SOLN: INTRAVENOUS | Status: DC

## 2022-03-27 MED ORDER — TAMSULOSIN HCL 0.4 MG PO CAPS
0.4000 mg | ORAL_CAPSULE | Freq: Every day | ORAL | Status: DC
  Administered 2022-03-28 – 2022-03-29 (×2): 0.4 mg via ORAL
  Filled 2022-03-27 (×2): qty 1

## 2022-03-27 MED ORDER — SODIUM CHLORIDE 0.9 % IV SOLN
100.0000 mg | Freq: Every day | INTRAVENOUS | Status: AC
  Administered 2022-03-28 – 2022-03-29 (×2): 100 mg via INTRAVENOUS
  Filled 2022-03-27 (×2): qty 20

## 2022-03-27 MED ORDER — LACTATED RINGERS IV BOLUS (SEPSIS)
1000.0000 mL | Freq: Once | INTRAVENOUS | Status: AC
  Administered 2022-03-27: 1000 mL via INTRAVENOUS

## 2022-03-27 MED ORDER — ACETAMINOPHEN 325 MG PO TABS: 650.0000 mg | ORAL_TABLET | Freq: Four times a day (QID) | ORAL | Status: DC | PRN

## 2022-03-27 MED ORDER — SODIUM CHLORIDE 0.9 % IV SOLN
200.0000 mg | Freq: Once | INTRAVENOUS | Status: AC
  Administered 2022-03-27: 200 mg via INTRAVENOUS
  Filled 2022-03-27: qty 40

## 2022-03-27 NOTE — Assessment & Plan Note (Signed)
-   suspected in setting of covid infection; minimal infiltrate appreciated LLL - continue O2, wean as able; not on home O2 - continue steroids

## 2022-03-27 NOTE — Assessment & Plan Note (Signed)
-   continue aricept  

## 2022-03-27 NOTE — H&P (Signed)
History and Physical    Dylan Moreno  [id number]  DOB: 02-Dec-1940  DOA: 03/27/2022  PCP: Seward Carol, MD Patient coming from: [facility]  Chief Complaint: Vomiting, malaise  HPI:  Dylan Moreno is an 82 yo male with PMH dementia, ADHD, SCC tongue, dysphagia, GERD, HLD, HTN, nephrolithiasis, TIA who presented from [place] due to vomiting, malaise and new hypoxia.  He was just evaluated in the ER on 03/26/2022 after a witnessed fall at his nursing facility.  He underwent trauma workup and was cleared after negative workup and discharged back to [facility].  Upon repeat evaluation in the ER on 03/27/2022 he was found to be hypoxic in the mid 80s on room air.  He was placed on 3 L oxygen.  He was also febrile, temperature 102.1.  CXR during trauma workup on 03/26/2022 was negative.  Upon repeating CXR on day of admission, there was very subtle developing left lower lobe opacity concerning for infiltrate.  WBC also mildly elevated at 11.5.  COVID testing returned positive.  He was admitted for further COVID treatment and monitoring.  I have personally briefly reviewed patient's old medical records in [facility] and discussed patient with the ER provider when appropriate/indicated.  Assessment and Plan: * COVID-19 virus infection - positive on PCR on admission with new O2 demand and subtle LLL opacity - lower suspicion for superimposed bacterial infection; non toxic appearing, minimal non-productive cough; noted mild WBC 11.5 - start remdesivir and decadron - trend inflammatory markers - check PCT; hold abx for now; will resume if PCT significantly elevated   Acute respiratory failure with hypoxia (HCC) - suspected in setting of covid infection; minimal infiltrate appreciated LLL - continue O2, wean as able; not on home O2 - continue steroids  HTN (hypertension) - hold losartan in setting of soft BPs  Dementia without behavioral disturbance ([place]) - continue  aricept  Acquired hypothyroidism - continue synthroid  Hyperlipemia - Continue Lipitor  History of TIA (transient ischemic attack) - Continue Plavix   Code Status:     Code Status: Full Code  DVT Prophylaxis: Lovenox     Anticipated disposition is to: [place] in 1 to 2 days  History: Past Medical History:  Diagnosis Date   Acute delirium 06/02/2014   ADHD (attention deficit hyperactivity disorder)    Alcohol abuse 06/08/2014   Arthritis    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   Cerebral ventriculomegaly 06/08/2014   Dysphagia 06/17/2014   Dysuria 07/15/2014   GERD (gastroesophageal reflux disease)    History of radiation therapy 04/06/14- 05/25/14   Base of tongue and bilateral neck 70 Gy in 35 fractions to gross disease, 63 Gy in 35 Fractions to high risk nodal echelons, and 56 Gy in 35 fractions to intermediate risk nodal echelons   Hypercholesterolemia    Hyperlipemia    Hypertension    Hyponatremia    Kidney stones    Leukopenia due to antineoplastic chemotherapy 04/28/2014   Major neurocognitive disorder ([place]) 01/22/2019   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   Mild cognitive impairment with memory loss 12/22/2014   Mild to moderate hearing loss 03/18/2014   Peyronie's disease    Subacute delirium 05/25/2014   TIA (transient ischemic attack) 12/29/12   left facial droop    Past Surgical History:  Procedure Laterality Date   COLONOSCOPY     CYSTOSCOPY W/ URETEROSCOPY W/ LITHOTRIPSY  2011   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  His smoking use included cigarettes and cigars. He has a 40.00 pack-year smoking history. He has never used smokeless tobacco. He reports that he does not currently use alcohol. He reports that he does not currently use drugs after having used the following drugs: Marijuana.  No Known Allergies  Family History  Problem Relation Age of Onset   Heart attack Mother        Deceased, 53   Alcohol abuse Mother    Hypothyroidism Father        Deceased, 68   Melanoma Brother    Alcohol abuse Brother    Healthy Son    Cancer Paternal Uncle        ?lung cancer    Home Medications: Prior to Admission medications   Medication Sig Start Date End Date Taking? Authorizing Provider  atorvastatin (LIPITOR) 40 MG tablet Take 40 mg by mouth daily. 08/26/18   [provider]  clopidogrel (PLAVIX) 75 MG tablet Place 1 tablet (75 mg total) into feeding tube daily. Patient taking differently: Take 75 mg by mouth daily. 06/03/14   Janece Canterbury, MD  donepezil (ARICEPT) 10 MG tablet TAKE 1 TABLET BY MOUTH EVERY DAY 11/27/21   Narda Amber K, DO  levothyroxine (SYNTHROID, LEVOTHROID) 112 MCG tablet Take 112 mcg by mouth daily. 10/14/17   [provider]  losartan (COZAAR) 25 MG tablet Take 25 mg by mouth daily. 08/18/18   [provider]  tamsulosin (FLOMAX) 0.4 MG CAPS capsule Take 0.4 mg by mouth daily. 05/11/16   [provider]  thiamine 250 MG tablet Take 250 mg by mouth daily.     [provider]    Review of Systems:  Review of Systems  Unable to perform ROS: Dementia    Physical Exam:  Vitals:   03/27/22 1500 03/27/22 1545 03/27/22 1815 03/27/22 1830  BP: (!) 126/54   108/74  Pulse: 97 97  77  Resp: (!) 22 20  14   Temp:  (!) 101.2 F (38.4 C) 98.5 F (36.9 C) 98.6 F (37 C)  TempSrc:   Oral   SpO2: 95% 93%  98%  Weight:      Height:       Physical Exam Constitutional:      General: He is not in acute distress.    Appearance: Normal  appearance. He is not ill-appearing.  HENT:     Head: Normocephalic and atraumatic.     Mouth/Throat:     Mouth: Mucous membranes are moist.  Eyes:     Extraocular Movements: Extraocular movements intact.  Cardiovascular:     Rate and Rhythm: Normal rate and regular rhythm.     Heart sounds: Normal heart sounds.  Pulmonary:     Effort: Pulmonary effort is normal. No respiratory distress.     Breath sounds: Normal breath sounds. No wheezing.  Abdominal:     General: Bowel sounds are normal. There is no distension.     Palpations: Abdomen is soft.     Tenderness: There  is no abdominal tenderness.  Musculoskeletal:        General: Normal range of motion.     Cervical back: Normal range of motion and neck supple.  Skin:    General: Skin is warm and dry.  Neurological:     Mental Status: He is alert. Mental status is at baseline. He is disoriented.  Psychiatric:        Mood and Affect: Mood normal.      Labs on Admission:  I have personally reviewed following labs and imaging studies Results for orders placed or performed during the hospital encounter of 03/27/22 (from the past 24 hour(s))  Lactic acid, plasma     Status: Abnormal   Collection Time: 03/27/22  2:30 PM  Result Value Ref Range   Lactic Acid, Venous 2.4 (HH) 0.5 - 1.9 mmol/L  Comprehensive metabolic panel     Status: Abnormal   Collection Time: 03/27/22  2:30 PM  Result Value Ref Range   Sodium 140 135 - 145 mmol/L   Potassium 4.4 3.5 - 5.1 mmol/L   Chloride 103 98 - 111 mmol/L   CO2 26 22 - 32 mmol/L   Glucose, Bld 127 (H) 70 - 99 mg/dL   BUN 23 8 - 23 mg/dL   Creatinine, Ser 1.15 0.61 - 1.24 mg/dL   Calcium 9.1 8.9 - 10.3 mg/dL   Total Protein 7.3 6.5 - 8.1 g/dL   Albumin 4.2 3.5 - 5.0 g/dL   AST 23 15 - 41 U/L   ALT 27 0 - 44 U/L   Alkaline Phosphatase 72 38 - 126 U/L   Total Bilirubin 2.4 (H) 0.3 - 1.2 mg/dL   GFR, Estimated >60 >60 mL/min   Anion gap 11 5 - 15  CBC with Differential     Status:  Abnormal   Collection Time: 03/27/22  2:30 PM  Result Value Ref Range   WBC 11.5 (H) 4.0 - 10.5 K/uL   RBC 4.74 4.22 - 5.81 MIL/uL   Hemoglobin 14.0 13.0 - 17.0 g/dL   HCT 43.2 39.0 - 52.0 %   MCV 91.1 80.0 - 100.0 fL   MCH 29.5 26.0 - 34.0 pg   MCHC 32.4 30.0 - 36.0 g/dL   RDW 13.2 11.5 - 15.5 %   Platelets 199 150 - 400 K/uL   nRBC 0.0 0.0 - 0.2 %   Neutrophils Relative % 90 %   Neutro Abs 10.3 (H) 1.7 - 7.7 K/uL   Lymphocytes Relative 4 %   Lymphs Abs 0.5 (L) 0.7 - 4.0 K/uL   Monocytes Relative 5 %   Monocytes Absolute 0.6 0.1 - 1.0 K/uL   Eosinophils Relative 0 %   Eosinophils Absolute 0.0 0.0 - 0.5 K/uL   Basophils Relative 0 %   Basophils Absolute 0.0 0.0 - 0.1 K/uL   Immature Granulocytes 1 %   Abs Immature Granulocytes 0.07 0.00 - 0.07 K/uL  Protime-INR     Status: None   Collection Time: 03/27/22  2:30 PM  Result Value Ref Range   Prothrombin Time 14.3 11.4 - 15.2 seconds   INR 1.1 0.8 - 1.2  APTT     Status: None   Collection Time: 03/27/22  2:30 PM  Result Value Ref Range   aPTT 30 24 - 36 seconds  Resp panel by RT-PCR (RSV, Flu A&B, Covid) Anterior Nasal Swab     Status: Abnormal   Collection Time: 03/27/22  2:43 PM   Specimen: Anterior Nasal Swab  Result  Value Ref Range   SARS Coronavirus 2 by RT PCR POSITIVE (A) NEGATIVE   Influenza A by PCR NEGATIVE NEGATIVE   Influenza B by PCR NEGATIVE NEGATIVE   Resp Syncytial Virus by PCR NEGATIVE NEGATIVE     Radiological Exams on Admission: US Abdomen Limited RUQ (LIVER/GB)  Result Date: 03/27/2022 CLINICAL DATA:  Elevated bilirubin EXAM: ULTRASOUND ABDOMEN LIMITED RIGHT UPPER QUADRANT COMPARISON:  None Available. FINDINGS: Evaluation is somewhat limited as the patient could not be properly positioned. Gallbladder: No cholelithiasis. The gallbladder wall measures up to 4 mm, just above the upper limit of normal. No pericholecystic fluid. No sonographic Murphy sign noted by sonographer. Common bile duct: Diameter: 3  mm, within normal limits. No intrahepatic biliary ductal dilatation. Liver: No focal lesion identified. Within normal limits in parenchymal echogenicity. Portal vein is patent on color Doppler imaging with normal direction of blood flow towards the liver. Other: None. IMPRESSION: The gallbladder wall measures up to 4 mm, just above the upper limit of normal. No cholelithiasis, pericholecystic fluid, or sonographic Murphy sign. Findings are equivocal for acute cholecystitis. Electronically Signed   By: Merilyn Baba M.D.   On: 03/27/2022 17:04   DG Chest Port 1 View  Result Date: 03/27/2022 CLINICAL DATA:  Sepsis. EXAM: PORTABLE CHEST 1 VIEW COMPARISON:  Chest x-ray 03/26/2022 FINDINGS: Underinflation with developing subtle opacity at the left lung base. Acute infiltrates possible. Recommend follow-up. No pneumothorax, effusion or edema. Normal cardiopericardial silhouette. Overlapping cardiac leads IMPRESSION: Subtle developing opacity left lung base. Infiltrate is possible. Recommend follow-up Electronically Signed   By: Jill Side M.D.   On: 03/27/2022 15:22   DG Chest 2 View  Result Date: 03/26/2022 CLINICAL DATA:  Fall. EXAM: CHEST - 2 VIEW COMPARISON:  May 28, 2014. FINDINGS: The heart size and mediastinal contours are within normal limits. Both lungs are clear. The visualized skeletal structures are unremarkable. IMPRESSION: No active cardiopulmonary disease. Electronically Signed   By: Marijo Conception M.D.   On: 03/26/2022 16:54   CT Head Wo Contrast  Result Date: 03/26/2022 CLINICAL DATA:  Unwitnessed fall, found on floor, dementia EXAM: CT HEAD WITHOUT CONTRAST CT CERVICAL SPINE WITHOUT CONTRAST TECHNIQUE: Multidetector CT imaging of the head and cervical spine was performed following the standard protocol without intravenous contrast. Multiplanar CT image reconstructions of the cervical spine were also generated. RADIATION DOSE REDUCTION: This exam was performed according to the departmental  dose-optimization program which includes automated exposure control, adjustment of the mA and/or kV according to patient size and/or use of iterative reconstruction technique. COMPARISON:  04/17/2018 FINDINGS: CT HEAD FINDINGS Brain: No evidence of acute infarction, hemorrhage, extra-axial collection or mass lesion/mass effect. Unchanged prominence of the bilateral lateral and third ventricles. Vascular: No hyperdense vessel or unexpected calcification. Skull: Normal. Negative for fracture or focal lesion. Sinuses/Orbits: No acute finding. Other: None. CT CERVICAL SPINE FINDINGS Alignment: Normal. Skull base and vertebrae: No acute fracture. No primary bone lesion or focal pathologic process. Soft tissues and spinal canal: No prevertebral fluid or swelling. No visible canal hematoma. Disc levels: Moderate disc space height loss and osteophytosis, worst at C5-C7. Upper chest: Negative. Other: None. IMPRESSION: 1. No acute intracranial pathology. 2. Unchanged prominence of the bilateral lateral and third ventricles. This appearance is most likely related to global cerebral volume loss, but can be seen in normal pressure hydrocephalus. 3. No fracture or static subluxation of the cervical spine. 4. Moderate multilevel cervical disc degenerative disease. Electronically Signed   By: Lanae Crumbly  Laqueta Carina M.D.   On: 03/26/2022 16:19   CT Cervical Spine Wo Contrast  Result Date: 03/26/2022 CLINICAL DATA:  Unwitnessed fall, found on floor, dementia EXAM: CT HEAD WITHOUT CONTRAST CT CERVICAL SPINE WITHOUT CONTRAST TECHNIQUE: Multidetector CT imaging of the head and cervical spine was performed following the standard protocol without intravenous contrast. Multiplanar CT image reconstructions of the cervical spine were also generated. RADIATION DOSE REDUCTION: This exam was performed according to the departmental dose-optimization program which includes automated exposure control, adjustment of the mA and/or kV according to patient  size and/or use of iterative reconstruction technique. COMPARISON:  04/17/2018 FINDINGS: CT HEAD FINDINGS Brain: No evidence of acute infarction, hemorrhage, extra-axial collection or mass lesion/mass effect. Unchanged prominence of the bilateral lateral and third ventricles. Vascular: No hyperdense vessel or unexpected calcification. Skull: Normal. Negative for fracture or focal lesion. Sinuses/Orbits: No acute finding. Other: None. CT CERVICAL SPINE FINDINGS Alignment: Normal. Skull base and vertebrae: No acute fracture. No primary bone lesion or focal pathologic process. Soft tissues and spinal canal: No prevertebral fluid or swelling. No visible canal hematoma. Disc levels: Moderate disc space height loss and osteophytosis, worst at C5-C7. Upper chest: Negative. Other: None. IMPRESSION: 1. No acute intracranial pathology. 2. Unchanged prominence of the bilateral lateral and third ventricles. This appearance is most likely related to global cerebral volume loss, but can be seen in normal pressure hydrocephalus. 3. No fracture or static subluxation of the cervical spine. 4. Moderate multilevel cervical disc degenerative disease. Electronically Signed   By: Delanna Ahmadi M.D.   On: 03/26/2022 16:19   US Abdomen Limited RUQ (LIVER/GB)  Final Result    DG Chest [facility] 1 View  Final Result      Consults called: n/a   EKG: Independently reviewed. NSR, LAFB   Dwyane Dee, MD [facility] 03/27/2022, 7:13 PM

## 2022-03-27 NOTE — Hospital Course (Signed)
Dylan Moreno is an 82 yo male with PMH dementia, ADHD, SCC tongue, dysphagia, GERD, HLD, HTN, nephrolithiasis, TIA who presented from [place] due to vomiting, malaise and new hypoxia.  He was just evaluated in the ER on 03/26/2022 after a witnessed fall at his nursing facility.  He underwent trauma workup and was cleared after negative workup and discharged back to [facility].  Upon repeat evaluation in the ER on 03/27/2022 he was found to be hypoxic in the mid 80s on room air.  He was placed on 3 L oxygen.  He was also febrile, temperature 102.1.  CXR during trauma workup on 03/26/2022 was negative.  Upon repeating CXR on day of admission, there was very subtle developing left lower lobe opacity concerning for infiltrate.  WBC also mildly elevated at 11.5.  COVID testing returned positive.  He was admitted for further COVID treatment and monitoring. He was also started on antibiotics due to concern for superimposed bacterial infection.  He had good clinical improvement with treatments and was able to be weaned to room air prior to discharge.  He also ambulated with mobility specialist with no hypoxia with exertion.  He is continued on steroids to complete at discharge upon returning back to assisted living.

## 2022-03-27 NOTE — ED Provider Notes (Signed)
[place] EMERGENCY DEPARTMENT AT [facility] Provider Note   CSN: [id number] Arrival date & time: 03/27/22  1358     History  Chief Complaint  Patient presents with   Emesis    Patient shocked on vomit - Ems reported. [facility].     Dylan Moreno is a 82 y.o. male.  82 y/o M with remote hx of Tongue cancer and dementia who presents BIB EMS from [place] living facility following episode of nausea, vomiting, coughing spell.  Unable to get reliable history from patient given history of dementia.  Per RN who discussed with EMS, patient vomited once, then started to have a gurgling cough.  Concern for possible aspiration pneumonia.  Saturations were mid 80s on arrival, he was placed on 3 L nasal cannula with improvement to mid 90s.  Patient denies any shortness of breath, chest pain.  Does endorse cough for several days.  Has had chills.  Denies any abdominal pain. He denies nausea. He can recall that he was here yesterday but cannot remember why he was here. He states that he is currently at [facility] because he enjoys the company.       Home Medications Prior to Admission medications   Medication Sig Start Date End Date Taking? Authorizing Provider  atorvastatin (LIPITOR) 40 MG tablet Take 40 mg by mouth daily. 08/26/18   [provider]  clopidogrel (PLAVIX) 75 MG tablet Place 1 tablet (75 mg total) into feeding tube daily. Patient taking differently: Take 75 mg by mouth daily. 06/03/14   Janece Canterbury, MD  donepezil (ARICEPT) 10 MG tablet TAKE 1 TABLET BY MOUTH EVERY DAY 11/27/21   Narda Amber K, DO  levothyroxine (SYNTHROID, LEVOTHROID) 112 MCG tablet Take 112 mcg by mouth daily. 10/14/17   [provider]  losartan (COZAAR) 25 MG tablet Take 25 mg by mouth daily. 08/18/18   [provider]  tamsulosin (FLOMAX) 0.4 MG CAPS capsule Take 0.4 mg by mouth daily. 05/11/16   [provider]  thiamine  250 MG tablet Take 250 mg by mouth daily.     [provider]     Allergies    Patient has no known allergies.    Review of Systems   Review of Systems  Reason unable to perform ROS: poor historian.   Physical Exam Updated Vital Signs BP (!) 126/54 (BP Location: Left Arm)   Pulse 97   Temp (!) 102.1 F (38.9 C)   Resp (!) 22   Ht 6' (1.829 m)   Wt 72.6 kg   SpO2 95%   BMI 21.71 kg/m  Physical Exam Constitutional:      General: He is not in acute distress.    Appearance: He is not ill-appearing.     Comments: Elderly, pleasant   HENT:     Head: Normocephalic and atraumatic.     Right Ear: External ear normal.     Left Ear: External ear normal.     Nose: Nose normal.     Mouth/Throat:     Mouth: Mucous membranes are moist.     Pharynx: Oropharynx is clear. No oropharyngeal exudate or posterior oropharyngeal erythema.  Eyes:     Conjunctiva/sclera: Conjunctivae normal.     Comments: Pinpoint pupils b/l  Cardiovascular:     Rate and Rhythm: Normal rate and regular rhythm.     Pulses: Normal pulses.     Heart sounds: No murmur heard. Pulmonary:  Effort: Pulmonary effort is normal. No respiratory distress.     Breath sounds: Rhonchi present. No wheezing.     Comments: Diminished sounds RLL, rhonchi LLL Abdominal:     General: Abdomen is flat. There is no distension.     Palpations: Abdomen is soft.     Tenderness: There is no abdominal tenderness. There is no guarding or rebound.  Genitourinary:    Penis: Normal.   Musculoskeletal:     Cervical back: Neck supple.  Lymphadenopathy:     Cervical: No cervical adenopathy.  Skin:    General: Skin is warm and dry.     Capillary Refill: Capillary refill takes less than 2 seconds.  Neurological:     Comments: Awake, alert, oriented to person but not time, situation, or place (reports Zac[facility] Pontes). Speech is clear. No focal deficits.     ED Results / Procedures / Treatments   Labs (all labs ordered are  listed, but only abnormal results are displayed) Labs Reviewed  RESP PANEL BY RT-PCR (RSV, FLU A&B, COVID)  [id number] - Abnormal; Notable for the following components:      Result Value   SARS Coronavirus 2 by RT PCR POSITIVE (*)    All other components within normal limits  LACTIC ACID, PLASMA - Abnormal; Notable for the following components:   Lactic Acid, Venous 2.4 (*)    All other components within normal limits  COMPREHENSIVE METABOLIC PANEL - Abnormal; Notable for the following components:   Glucose, Bld 127 (*)    Total Bilirubin 2.4 (*)    All other components within normal limits  CBC WITH DIFFERENTIAL/PLATELET - Abnormal; Notable for the following components:   WBC 11.5 (*)    Neutro Abs 10.3 (*)    Lymphs Abs 0.5 (*)    All other components within normal limits  CULTURE, BLOOD (ROUTINE X 2)  CULTURE, BLOOD (ROUTINE X 2)  PROTIME-INR  APTT  LACTIC ACID, PLASMA  URINALYSIS, W/ REFLEX TO CULTURE (INFECTION SUSPECTED)    EKG None  Radiology US Abdomen Limited RUQ (LIVER/GB)  Result Date: 03/27/2022 CLINICAL DATA:  Elevated bilirubin EXAM: ULTRASOUND ABDOMEN LIMITED RIGHT UPPER QUADRANT COMPARISON:  None Available. FINDINGS: Evaluation is somewhat limited as the patient could not be properly positioned. Gallbladder: No cholelithiasis. The gallbladder wall measures up to 4 mm, just above the upper limit of normal. No pericholecystic fluid. No sonographic Murphy sign noted by sonographer. Common bile duct: Diameter: 3 mm, within normal limits. No intrahepatic biliary ductal dilatation. Liver: No focal lesion identified. Within normal limits in parenchymal echogenicity. Portal vein is patent on color Doppler imaging with normal direction of blood flow towards the liver. Other: None. IMPRESSION: The gallbladder wall measures up to 4 mm, just above the upper limit of normal. No cholelithiasis, pericholecystic fluid, or sonographic Murphy sign. Findings are equivocal for acute  cholecystitis. Electronically Signed   By: Merilyn Baba M.D.   On: 03/27/2022 17:04   DG Chest Port 1 View  Result Date: 03/27/2022 CLINICAL DATA:  Sepsis. EXAM: PORTABLE CHEST 1 VIEW COMPARISON:  Chest x-ray 03/26/2022 FINDINGS: Underinflation with developing subtle opacity at the left lung base. Acute infiltrates possible. Recommend follow-up. No pneumothorax, effusion or edema. Normal cardiopericardial silhouette. Overlapping cardiac leads IMPRESSION: Subtle developing opacity left lung base. Infiltrate is possible. Recommend follow-up Electronically Signed   By: Jill Side M.D.   On: 03/27/2022 15:22   DG Chest 2 View  Result Date: 03/26/2022 CLINICAL DATA:  Fall. EXAM: CHEST - 2  VIEW COMPARISON:  May 28, 2014. FINDINGS: The heart size and mediastinal contours are within normal limits. Both lungs are clear. The visualized skeletal structures are unremarkable. IMPRESSION: No active cardiopulmonary disease. Electronically Signed   By: Marijo Conception M.D.   On: 03/26/2022 16:54   CT Head Wo Contrast  Result Date: 03/26/2022 CLINICAL DATA:  Unwitnessed fall, found on floor, dementia EXAM: CT HEAD WITHOUT CONTRAST CT CERVICAL SPINE WITHOUT CONTRAST TECHNIQUE: Multidetector CT imaging of the head and cervical spine was performed following the standard protocol without intravenous contrast. Multiplanar CT image reconstructions of the cervical spine were also generated. RADIATION DOSE REDUCTION: This exam was performed according to the departmental dose-optimization program which includes automated exposure control, adjustment of the mA and/or kV according to patient size and/or use of iterative reconstruction technique. COMPARISON:  04/17/2018 FINDINGS: CT HEAD FINDINGS Brain: No evidence of acute infarction, hemorrhage, extra-axial collection or mass lesion/mass effect. Unchanged prominence of the bilateral lateral and third ventricles. Vascular: No hyperdense vessel or unexpected calcification. Skull:  Normal. Negative for fracture or focal lesion. Sinuses/Orbits: No acute finding. Other: None. CT CERVICAL SPINE FINDINGS Alignment: Normal. Skull base and vertebrae: No acute fracture. No primary bone lesion or focal pathologic process. Soft tissues and spinal canal: No prevertebral fluid or swelling. No visible canal hematoma. Disc levels: Moderate disc space height loss and osteophytosis, worst at C5-C7. Upper chest: Negative. Other: None. IMPRESSION: 1. No acute intracranial pathology. 2. Unchanged prominence of the bilateral lateral and third ventricles. This appearance is most likely related to global cerebral volume loss, but can be seen in normal pressure hydrocephalus. 3. No fracture or static subluxation of the cervical spine. 4. Moderate multilevel cervical disc degenerative disease. Electronically Signed   By: Delanna Ahmadi M.D.   On: 03/26/2022 16:19   CT Cervical Spine Wo Contrast  Result Date: 03/26/2022 CLINICAL DATA:  Unwitnessed fall, found on floor, dementia EXAM: CT HEAD WITHOUT CONTRAST CT CERVICAL SPINE WITHOUT CONTRAST TECHNIQUE: Multidetector CT imaging of the head and cervical spine was performed following the standard protocol without intravenous contrast. Multiplanar CT image reconstructions of the cervical spine were also generated. RADIATION DOSE REDUCTION: This exam was performed according to the departmental dose-optimization program which includes automated exposure control, adjustment of the mA and/or kV according to patient size and/or use of iterative reconstruction technique. COMPARISON:  04/17/2018 FINDINGS: CT HEAD FINDINGS Brain: No evidence of acute infarction, hemorrhage, extra-axial collection or mass lesion/mass effect. Unchanged prominence of the bilateral lateral and third ventricles. Vascular: No hyperdense vessel or unexpected calcification. Skull: Normal. Negative for fracture or focal lesion. Sinuses/Orbits: No acute finding. Other: None. CT CERVICAL SPINE FINDINGS  Alignment: Normal. Skull base and vertebrae: No acute fracture. No primary bone lesion or focal pathologic process. Soft tissues and spinal canal: No prevertebral fluid or swelling. No visible canal hematoma. Disc levels: Moderate disc space height loss and osteophytosis, worst at C5-C7. Upper chest: Negative. Other: None. IMPRESSION: 1. No acute intracranial pathology. 2. Unchanged prominence of the bilateral lateral and third ventricles. This appearance is most likely related to global cerebral volume loss, but can be seen in normal pressure hydrocephalus. 3. No fracture or static subluxation of the cervical spine. 4. Moderate multilevel cervical disc degenerative disease. Electronically Signed   By: Delanna Ahmadi M.D.   On: 03/26/2022 16:19    Procedures Procedures    Medications Ordered in ED Medications  lactated ringers infusion (has no administration in time range)  cefTRIAXone (ROCEPHIN) 2 g  in sodium chloride 0.9 % 100 mL IVPB (0 g Intravenous Stopped 03/27/22 1545)  azithromycin (ZITHROMAX) 500 mg in sodium chloride 0.9 % 250 mL IVPB (500 mg Intravenous New Bag/Given 03/27/22 1457)  acetaminophen (TYLENOL) tablet 650 mg (650 mg Oral Given 03/27/22 1441)  lactated ringers bolus 1,000 mL (1,000 mLs Intravenous New Bag/Given 03/27/22 1551)    ED Course/ Medical Decision Making/ A&P                             Medical Decision Making 2:44 PM 83 y/o M with remote hx of Tongue cancer and dementia who presents BIB EMS from [place] living facility following episode of nausea, vomiting, coughing spell.  Unable to get reliable history from patient given history of dementia. Meeting sepsis on arrival given temp 102.1, elevated WBC yesterday, tachypnea. Sepsis protocol initiated. Concern for possible aspiration pneumonia given N/V and new hypoxemia with new oxygen requirement. Could also be intra-abdominal pathology given N/V, could be viral etiology as well. Low concern for cardiac etiology given  similar sx yesterday and negative serial troponin (also given fever seems less likely). Attempted to contact [facility] x2 but unable to get in contact with appropriate personnel for additional information.   3:05 PM Mild leukocytosis 11.5. D/w Francesca Jewett, worker at [employer]. Reports that Mr. Caylor has been vomiting since yesterday. Yesterday he fell as well, which is what prompted evaluation. She has also noticed worsening weakness and continued vomiting today. Reports that his "speech was not clear" and he was "shaking". She reports that he is normally lively and very talkative, just appeared off today and not himself. She is not aware of any dementia diagnosis. Does not feel that he has tremors at baseline.     3:28 PM Chest x-ray with subtle developing opacity left lung base. CMP with slightly elevated total bilirubin at 2.4, was 1.9 yesterday.  LFTs unremarkable. Electrolytes stable. Given N/V and elevated total bili, will also do RUQ U/S to assess biliary pathology.   3:48 PM COVID positive.   4:24 PM LA 2.4. Will trend. Receiving bolus.   5:08 PM RUQ U/S equivocal for acute cholecystitis. Given COVID with hypoxemia and new oxygen requirement will admit. Consult to hospitalist ordered.   5:26 PM d/w hospitalist, Dr. Sabino Gasser, for admission.          Final Clinical Impression(s) / ED Diagnoses Final diagnoses:  Acute hypoxemic respiratory failure due to COVID-19 (HCC)  Nausea and vomiting, unspecified vomiting type   Rx / DC Orders ED Discharge Orders     None         Sharion Settler, DO 03/27/22 1729    Blanchie Dessert, MD 03/28/22 [phone]

## 2022-03-27 NOTE — Assessment & Plan Note (Signed)
-   hold losartan in setting of soft BPs

## 2022-03-27 NOTE — ED Notes (Signed)
Pt resting in bed with no acute distress noted at this time.  

## 2022-03-27 NOTE — Assessment & Plan Note (Signed)
-   positive on PCR on admission with new O2 demand and subtle LLL opacity -Completed remdesivir x 3 days.  Discharged with remainder course of Decadron

## 2022-03-27 NOTE — Assessment & Plan Note (Signed)
Continue Plavix.  ?

## 2022-03-27 NOTE — Sepsis Progress Note (Signed)
Notified bedside nurse of need to draw repeat lactic acid. 

## 2022-03-27 NOTE — ED Notes (Signed)
ED TO INPATIENT HANDOFF REPORT  ED Nurse Name and Phone #: Lysle Rubens RN   S Name/Age/Gender [name] 82 y.o. male Room/Bed: WA22/WA22  Code Status   Code Status: Full Code  Home/SNF/Other Skilled nursing facility Patient oriented to: self Is this baseline? Yes   Triage Complete: Triage complete  Chief Complaint COVID-19 virus infection [U07.1]  Triage Note No notes on file   Allergies No Known Allergies  Level of Care/Admitting Diagnosis ED Disposition     ED Disposition  Admit   Condition  --   [facility] Area: [place] [[id number]]  Level of Care: Progressive [102]  Admit to Progressive based on following criteria: MULTISYSTEM THREATS such as stable sepsis, metabolic/electrolyte imbalance with or without encephalopathy that is responding to early treatment.  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   Dysphagia 06/17/2014   Dysuria 07/15/2014   GERD (gastroesophageal reflux disease)    History of radiation therapy 04/06/14- 05/25/14   Base of tongue and bilateral neck 70 Gy in 35 fractions to gross disease, 63 Gy in 35 Fractions to high risk nodal echelons, and 56 Gy in 35 fractions to intermediate risk nodal echelons   Hypercholesterolemia    Hyperlipemia    Hypertension    Hyponatremia    Kidney stones    Leukopenia due to antineoplastic chemotherapy 04/28/2014   Major neurocognitive  disorder ([place]) 01/22/2019   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   Mild cognitive impairment with memory loss 12/22/2014   Mild to moderate hearing loss 03/18/2014   Peyronie's disease    Subacute delirium 05/25/2014   TIA (transient ischemic attack) 12/29/12   left facial droop   Past Surgical History:  Procedure Laterality Date   COLONOSCOPY     CYSTOSCOPY W/ URETEROSCOPY W/ LITHOTRIPSY  2011   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   Procedure: DUPUYTREN CONTRACTURE RELEASE RIGHT PALM,RING AND SMALL FINGER;  Surgeon: Cammie Sickle., MD;  Location: [place];  Service: Orthopedics;  Laterality: Right;   HAND SURGERY     right   SHOULDER ARTHROSCOPY W/ ROTATOR CUFF REPAIR     left   TONSILLECTOMY       A IV Location/Drains/Wounds Patient Lines/Drains/Airways Status     Active Line/Drains/Airways     Name Placement date Placement time Site Days   Peripheral IV 03/27/22 20 G Right Antecubital 03/27/22  1423  Antecubital  less than 1            Intake/Output Last 24 hours  Intake/Output Summary (Last 24 hours) at 03/27/2022 1911 Last data filed at 03/27/2022 1834 Gross per 24 hour  Intake 1350 ml  Output --  Net 1350 ml    Labs/Imaging Results for orders placed or performed during the hospital encounter of 03/27/22 (from the past 48 hour(s))  Lactic acid, plasma  Status: Abnormal   Collection Time: 03/27/22  2:30 PM  Result Value Ref Range   Lactic Acid, Venous 2.4 (HH) 0.5 - 1.9 mmol/L    Comment: CRITICAL RESULT CALLED TO, READ BACK BY AND VERIFIED WITH K.GRANT, RN AT 1619 ON 03.26.24 BY N.THOMPSON Performed at [facility], [place] [address] [place], [place] [address]   Comprehensive metabolic panel     Status: Abnormal   Collection Time: 03/27/22  2:30 PM  Result Value Ref Range   Sodium 140 135 - 145 mmol/L   Potassium 4.4 3.5 - 5.1 mmol/L    Chloride 103 98 - 111 mmol/L   CO2 26 22 - 32 mmol/L   Glucose, Bld 127 (H) 70 - 99 mg/dL    Comment: Glucose reference range applies only to samples taken after fasting for at least 8 hours.   BUN 23 8 - 23 mg/dL   Creatinine, Ser 1.15 0.61 - 1.24 mg/dL   Calcium 9.1 8.9 - 10.3 mg/dL   Total Protein 7.3 6.5 - 8.1 g/dL   Albumin 4.2 3.5 - 5.0 g/dL   AST 23 15 - 41 U/L   ALT 27 0 - 44 U/L   Alkaline Phosphatase 72 38 - 126 U/L   Total Bilirubin 2.4 (H) 0.3 - 1.2 mg/dL   GFR, Estimated >60 >60 mL/min    Comment: (NOTE) Calculated using the CKD-EPI Creatinine Equation (2021)    Anion gap 11 5 - 15    Comment: Performed at [facility], [place] [address], [place], [place] [address]  CBC with Differential     Status: Abnormal   Collection Time: 03/27/22  2:30 PM  Result Value Ref Range   WBC 11.5 (H) 4.0 - 10.5 K/uL   RBC 4.74 4.22 - 5.81 MIL/uL   Hemoglobin 14.0 13.0 - 17.0 g/dL   HCT 43.2 39.0 - 52.0 %   MCV 91.1 80.0 - 100.0 fL   MCH 29.5 26.0 - 34.0 pg   MCHC 32.4 30.0 - 36.0 g/dL   RDW 13.2 11.5 - 15.5 %   Platelets 199 150 - 400 K/uL   nRBC 0.0 0.0 - 0.2 %   Neutrophils Relative % 90 %   Neutro Abs 10.3 (H) 1.7 - 7.7 K/uL   Lymphocytes Relative 4 %   Lymphs Abs 0.5 (L) 0.7 - 4.0 K/uL   Monocytes Relative 5 %   Monocytes Absolute 0.6 0.1 - 1.0 K/uL   Eosinophils Relative 0 %   Eosinophils Absolute 0.0 0.0 - 0.5 K/uL   Basophils Relative 0 %   Basophils Absolute 0.0 0.0 - 0.1 K/uL   Immature Granulocytes 1 %   Abs Immature Granulocytes 0.07 0.00 - 0.07 K/uL    Comment: Performed at [facility], [place] [address] [place], [place] [address]  Protime-INR     Status: None   Collection Time: 03/27/22  2:30 PM  Result Value Ref Range   Prothrombin Time 14.3 11.4 - 15.2 seconds   INR 1.1 0.8 - 1.2    Comment: (NOTE) INR goal varies based on device and disease states. Performed at [facility], [place] [address] [place], [place] [address]   APTT     Status: None   Collection Time: 03/27/22  2:30 PM  Result Value Ref Range   aPTT 30 24 - 36 seconds    Comment: Performed at [facility], [place] [address] [place],  [address]  Resp panel by  RT-PCR (RSV, Flu A&B, Covid) Anterior Nasal Swab     Status: Abnormal   Collection Time: 03/27/22  2:43 PM   Specimen: Anterior Nasal Swab  Result Value Ref Range   SARS Coronavirus 2 by RT PCR POSITIVE (A) NEGATIVE    Comment: (NOTE) SARS-CoV-2 target nucleic acids are DETECTED.  The SARS-CoV-2 RNA is generally detectable in upper respiratory specimens during the acute phase of infection. Positive results are indicative of the presence of the identified virus, but do not rule out bacterial infection or co-infection with other pathogens not detected by the test. Clinical correlation with patient history and other diagnostic information is necessary to determine patient infection status. The expected result is Negative.  Fact Sheet for Patients: EntrepreneurPulse.com.au  Fact Sheet for Healthcare Providers: IncredibleEmployment.be  This test is not yet approved or cleared by the [place] FDA and  has been authorized for detection and/or diagnosis of SARS-CoV-2 by FDA under an Emergency Use Authorization (EUA).  This EUA will remain in effect (meaning this test can be used) for the duration of  the COVID-19 declaration under Section 564(b)(1) of the A ct, 21 U.S.C. section 360bbb-3(b)(1), unless the authorization is terminated or revoked sooner.     Influenza A by PCR NEGATIVE NEGATIVE   Influenza B by PCR NEGATIVE NEGATIVE    Comment: (NOTE) The Xpert Xpress SARS-CoV-2/FLU/RSV plus assay is intended as an aid in the diagnosis of influenza from Nasopharyngeal swab specimens and should not be used as a sole basis for treatment. Nasal washings and aspirates are unacceptable for Xpert  Xpress SARS-CoV-2/FLU/RSV testing.  Fact Sheet for Patients: EntrepreneurPulse.com.au  Fact Sheet for Healthcare Providers: IncredibleEmployment.be  This test is not yet approved or cleared by the [place] FDA and has been authorized for detection and/or diagnosis of SARS-CoV-2 by FDA under an Emergency Use Authorization (EUA). This EUA will remain in effect (meaning this test can be used) for the duration of the COVID-19 declaration under Section 564(b)(1) of the Act, 21 U.S.C. section 360bbb-3(b)(1), unless the authorization is terminated or revoked.     Resp Syncytial Virus by PCR NEGATIVE NEGATIVE    Comment: (NOTE) Fact Sheet for Patients: EntrepreneurPulse.com.au  Fact Sheet for Healthcare Providers: IncredibleEmployment.be  This test is not yet approved or cleared by the [place] FDA and has been authorized for detection and/or diagnosis of SARS-CoV-2 by FDA under an Emergency Use Authorization (EUA). This EUA will remain in effect (meaning this test can be used) for the duration of the COVID-19 declaration under Section 564(b)(1) of the Act, 21 U.S.C. section 360bbb-3(b)(1), unless the authorization is terminated or revoked.  Performed at [facility], [place] [address], [place], [place] [address]    US Abdomen Limited RUQ (LIVER/GB)  Result Date: 03/27/2022 CLINICAL DATA:  Elevated bilirubin EXAM: ULTRASOUND ABDOMEN LIMITED RIGHT UPPER QUADRANT COMPARISON:  None Available. FINDINGS: Evaluation is somewhat limited as the patient could not be properly positioned. Gallbladder: No cholelithiasis. The gallbladder wall measures up to 4 mm, just above the upper limit of normal. No pericholecystic fluid. No sonographic Murphy sign noted by sonographer. Common bile duct: Diameter: 3 mm, within normal limits. No intrahepatic biliary ductal dilatation. Liver: No focal lesion  identified. Within normal limits in parenchymal echogenicity. Portal vein is patent on color Doppler imaging with normal direction of blood flow towards the liver. Other: None. IMPRESSION: The gallbladder wall measures up to 4 mm, just above the upper limit of normal. No cholelithiasis, pericholecystic fluid, or sonographic  Murphy sign. Findings are equivocal for acute cholecystitis. Electronically Signed   By: Merilyn Baba M.D.   On: 03/27/2022 17:04   DG Chest Port 1 View  Result Date: 03/27/2022 CLINICAL DATA:  Sepsis. EXAM: PORTABLE CHEST 1 VIEW COMPARISON:  Chest x-ray 03/26/2022 FINDINGS: Underinflation with developing subtle opacity at the left lung base. Acute infiltrates possible. Recommend follow-up. No pneumothorax, effusion or edema. Normal cardiopericardial silhouette. Overlapping cardiac leads IMPRESSION: Subtle developing opacity left lung base. Infiltrate is possible. Recommend follow-up Electronically Signed   By: Jill Side M.D.   On: 03/27/2022 15:22   DG Chest 2 View  Result Date: 03/26/2022 CLINICAL DATA:  Fall. EXAM: CHEST - 2 VIEW COMPARISON:  May 28, 2014. FINDINGS: The heart size and mediastinal contours are within normal limits. Both lungs are clear. The visualized skeletal structures are unremarkable. IMPRESSION: No active cardiopulmonary disease. Electronically Signed   By: Marijo Conception M.D.   On: 03/26/2022 16:54   CT Head Wo Contrast  Result Date: 03/26/2022 CLINICAL DATA:  Unwitnessed fall, found on floor, dementia EXAM: CT HEAD WITHOUT CONTRAST CT CERVICAL SPINE WITHOUT CONTRAST TECHNIQUE: Multidetector CT imaging of the head and cervical spine was performed following the standard protocol without intravenous contrast. Multiplanar CT image reconstructions of the cervical spine were also generated. RADIATION DOSE REDUCTION: This exam was performed according to the departmental dose-optimization program which includes automated exposure control, adjustment of the mA  and/or kV according to patient size and/or use of iterative reconstruction technique. COMPARISON:  04/17/2018 FINDINGS: CT HEAD FINDINGS Brain: No evidence of acute infarction, hemorrhage, extra-axial collection or mass lesion/mass effect. Unchanged prominence of the bilateral lateral and third ventricles. Vascular: No hyperdense vessel or unexpected calcification. Skull: Normal. Negative for fracture or focal lesion. Sinuses/Orbits: No acute finding. Other: None. CT CERVICAL SPINE FINDINGS Alignment: Normal. Skull base and vertebrae: No acute fracture. No primary bone lesion or focal pathologic process. Soft tissues and spinal canal: No prevertebral fluid or swelling. No visible canal hematoma. Disc levels: Moderate disc space height loss and osteophytosis, worst at C5-C7. Upper chest: Negative. Other: None. IMPRESSION: 1. No acute intracranial pathology. 2. Unchanged prominence of the bilateral lateral and third ventricles. This appearance is most likely related to global cerebral volume loss, but can be seen in normal pressure hydrocephalus. 3. No fracture or static subluxation of the cervical spine. 4. Moderate multilevel cervical disc degenerative disease. Electronically Signed   By: Delanna Ahmadi M.D.   On: 03/26/2022 16:19   CT Cervical Spine Wo Contrast  Result Date: 03/26/2022 CLINICAL DATA:  Unwitnessed fall, found on floor, dementia EXAM: CT HEAD WITHOUT CONTRAST CT CERVICAL SPINE WITHOUT CONTRAST TECHNIQUE: Multidetector CT imaging of the head and cervical spine was performed following the standard protocol without intravenous contrast. Multiplanar CT image reconstructions of the cervical spine were also generated. RADIATION DOSE REDUCTION: This exam was performed according to the departmental dose-optimization program which includes automated exposure control, adjustment of the mA and/or kV according to patient size and/or use of iterative reconstruction technique. COMPARISON:  04/17/2018 FINDINGS:  CT HEAD FINDINGS Brain: No evidence of acute infarction, hemorrhage, extra-axial collection or mass lesion/mass effect. Unchanged prominence of the bilateral lateral and third ventricles. Vascular: No hyperdense vessel or unexpected calcification. Skull: Normal. Negative for fracture or focal lesion. Sinuses/Orbits: No acute finding. Other: None. CT CERVICAL SPINE FINDINGS Alignment: Normal. Skull base and vertebrae: No acute fracture. No primary bone lesion or focal pathologic process. Soft tissues and spinal canal: No prevertebral  fluid or swelling. No visible canal hematoma. Disc levels: Moderate disc space height loss and osteophytosis, worst at C5-C7. Upper chest: Negative. Other: None. IMPRESSION: 1. No acute intracranial pathology. 2. Unchanged prominence of the bilateral lateral and third ventricles. This appearance is most likely related to global cerebral volume loss, but can be seen in normal pressure hydrocephalus. 3. No fracture or static subluxation of the cervical spine. 4. Moderate multilevel cervical disc degenerative disease. Electronically Signed   By: Delanna Ahmadi M.D.   On: 03/26/2022 16:19    Pending Labs Unresulted Labs (From admission, onward)     Start     Ordered   03/27/22 1748  Procalcitonin  Add-on,   AD       References:    Procalcitonin Lower Respiratory Tract Infection AND Sepsis Procalcitonin Algorithm   03/27/22 1747   03/27/22 1444  Urinalysis, w/ Reflex to Culture (Infection Suspected) -Urine, Catheterized  (Septic presentation on arrival (screening labs, nursing and treatment orders for obvious sepsis))  Once,   URGENT       Question:  Specimen Source  Answer:  Urine, Catheterized   03/27/22 1443   03/27/22 1443  Lactic acid, plasma  (Septic presentation on arrival (screening labs, nursing and treatment orders for obvious sepsis))  STAT Now then every 2 hours,   R      03/27/22 1443   03/27/22 1443  Blood Culture (routine x 2)  (Septic presentation on arrival  (screening labs, nursing and treatment orders for obvious sepsis))  BLOOD CULTURE X 2,   STAT      03/27/22 1443   Signed and Held  CBC with Differential/Platelet  Daily,   R     Question:  Specimen collection method  Answer:  Lab=Lab collect   Signed and Held   Signed and Held  Comprehensive metabolic panel  Daily,   R     Question:  Specimen collection method  Answer:  Lab=Lab collect   Signed and Held   Signed and Held  C-reactive protein  Daily,   R     Question:  Specimen collection method  Answer:  Lab=Lab collect   Signed and Held   Signed and Held  D-dimer, quantitative  Daily,   R      Signed and Held   Signed and Held  Magnesium  Daily,   R     Question:  Specimen collection method  Answer:  Lab=Lab collect   Signed and Held   Signed and Held  Lactate dehydrogenase  Daily,   R     Question:  Specimen collection method  Answer:  Lab=Lab collect   Signed and Held   Signed and Held  Procalcitonin  Tomorrow morning,   R       References:    Procalcitonin Lower Respiratory Tract Infection AND Sepsis Procalcitonin Algorithm  Question:  Specimen collection method  Answer:  Lab=Lab collect   Signed and Held            Vitals/Pain Today's Vitals   03/27/22 1545 03/27/22 1600 03/27/22 1815 03/27/22 1830  BP:    108/74  Pulse: 97   77  Resp: 20   14  Temp: (!) 101.2 F (38.4 C)  98.5 F (36.9 C) 98.6 F (37 C)  TempSrc:   Oral   SpO2: 93%   98%  Weight:      Height:      PainSc:  0-No pain      Isolation Precautions Airborne  and Contact precautions  Medications Medications  lactated ringers infusion ( Intravenous New Bag/Given 03/27/22 1750)  remdesivir 200 mg in sodium chloride 0.9% 250 mL IVPB (has no administration in time range)    Followed by  remdesivir 100 mg in sodium chloride 0.9 % 100 mL IVPB (has no administration in time range)  dexamethasone (DECADRON) tablet 6 mg (6 mg Oral Given 03/27/22 1835)  acetaminophen (TYLENOL) tablet 650 mg (650 mg Oral  Given 03/27/22 1441)  lactated ringers bolus 1,000 mL (0 mLs Intravenous Stopped 03/27/22 1834)    Mobility non-ambulatory     Focused Assessments Cardiac Assessment Handoff:  Cardiac Rhythm: Sinus tachycardia Lab Results  Component Value Date   TROPONINI <0.30 12/29/2012   No results found for: "DDIMER" Does the Patient currently have chest pain?  No  , Neuro Assessment Handoff:  Swallow screen pass?    Cardiac Rhythm: Sinus tachycardia       Neuro Assessment:   Neuro Checks:      Has TPA been given?  If patient is a Neuro Trauma and patient is going to OR before floor call report to 4N Charge nurse: [phone] or [phone]  , Renal Assessment Handoff:  Hemodialysis Schedule:  Last Hemodialysis date and time:   Restricted appendage:    , Pulmonary Assessment Handoff:  Lung sounds: L Breath Sounds: Coarse crackles R Breath Sounds: Coarse crackles O2 Device: Nasal Cannula O2 Flow Rate (L/min): 3 L/min    R Recommendations: See Admitting Provider Note  Report given to:   Additional Notes:

## 2022-03-27 NOTE — Sepsis Progress Note (Signed)
Sepsis protocol monitored by eLink ?

## 2022-03-27 NOTE — Assessment & Plan Note (Signed)
-  Continue Lipitor °

## 2022-03-27 NOTE — Assessment & Plan Note (Signed)
-  continue synthroid 

## 2022-03-28 DIAGNOSIS — Z87442 Personal history of urinary calculi: Secondary | ICD-10-CM | POA: Diagnosis not present

## 2022-03-28 DIAGNOSIS — F0390 Unspecified dementia without behavioral disturbance: Secondary | ICD-10-CM | POA: Diagnosis present

## 2022-03-28 DIAGNOSIS — R410 Disorientation, unspecified: Secondary | ICD-10-CM | POA: Diagnosis not present

## 2022-03-28 DIAGNOSIS — Z87891 Personal history of nicotine dependence: Secondary | ICD-10-CM | POA: Diagnosis not present

## 2022-03-28 DIAGNOSIS — J9601 Acute respiratory failure with hypoxia: Secondary | ICD-10-CM | POA: Diagnosis present

## 2022-03-28 DIAGNOSIS — Z7902 Long term (current) use of antithrombotics/antiplatelets: Secondary | ICD-10-CM | POA: Diagnosis not present

## 2022-03-28 DIAGNOSIS — Z79890 Hormone replacement therapy (postmenopausal): Secondary | ICD-10-CM | POA: Diagnosis not present

## 2022-03-28 DIAGNOSIS — U071 COVID-19: Secondary | ICD-10-CM | POA: Diagnosis present

## 2022-03-28 DIAGNOSIS — Z85810 Personal history of malignant neoplasm of tongue: Secondary | ICD-10-CM | POA: Diagnosis not present

## 2022-03-28 DIAGNOSIS — Z923 Personal history of irradiation: Secondary | ICD-10-CM | POA: Diagnosis not present

## 2022-03-28 DIAGNOSIS — Y92129 Unspecified place in nursing home as the place of occurrence of the external cause: Secondary | ICD-10-CM | POA: Diagnosis not present

## 2022-03-28 DIAGNOSIS — S0081XA Abrasion of other part of head, initial encounter: Secondary | ICD-10-CM | POA: Diagnosis present

## 2022-03-28 DIAGNOSIS — Z7401 Bed confinement status: Secondary | ICD-10-CM | POA: Diagnosis not present

## 2022-03-28 DIAGNOSIS — W19XXXA Unspecified fall, initial encounter: Secondary | ICD-10-CM | POA: Diagnosis present

## 2022-03-28 DIAGNOSIS — J1282 Pneumonia due to coronavirus disease 2019: Secondary | ICD-10-CM | POA: Diagnosis present

## 2022-03-28 DIAGNOSIS — R112 Nausea with vomiting, unspecified: Secondary | ICD-10-CM | POA: Diagnosis present

## 2022-03-28 DIAGNOSIS — H9190 Unspecified hearing loss, unspecified ear: Secondary | ICD-10-CM | POA: Diagnosis present

## 2022-03-28 DIAGNOSIS — Z79899 Other long term (current) drug therapy: Secondary | ICD-10-CM | POA: Diagnosis not present

## 2022-03-28 DIAGNOSIS — I10 Essential (primary) hypertension: Secondary | ICD-10-CM | POA: Diagnosis present

## 2022-03-28 DIAGNOSIS — Z8249 Family history of ischemic heart disease and other diseases of the circulatory system: Secondary | ICD-10-CM | POA: Diagnosis not present

## 2022-03-28 DIAGNOSIS — R4182 Altered mental status, unspecified: Secondary | ICD-10-CM | POA: Diagnosis not present

## 2022-03-28 DIAGNOSIS — E7800 Pure hypercholesterolemia, unspecified: Secondary | ICD-10-CM | POA: Diagnosis present

## 2022-03-28 DIAGNOSIS — E039 Hypothyroidism, unspecified: Secondary | ICD-10-CM | POA: Diagnosis present

## 2022-03-28 DIAGNOSIS — Z8673 Personal history of transient ischemic attack (TIA), and cerebral infarction without residual deficits: Secondary | ICD-10-CM | POA: Diagnosis not present

## 2022-03-28 LAB — CBC WITH DIFFERENTIAL/PLATELET
Abs Immature Granulocytes: 0.06 10*3/uL (ref 0.00–0.07)
Basophils Absolute: 0 10*3/uL (ref 0.0–0.1)
Basophils Relative: 0 %
Eosinophils Absolute: 0 10*3/uL (ref 0.0–0.5)
Eosinophils Relative: 0 %
HCT: 39 % (ref 39.0–52.0)
Hemoglobin: 12.6 g/dL — ABNORMAL LOW (ref 13.0–17.0)
Immature Granulocytes: 0 %
Lymphocytes Relative: 4 %
Lymphs Abs: 0.5 10*3/uL — ABNORMAL LOW (ref 0.7–4.0)
MCH: 29.4 pg (ref 26.0–34.0)
MCHC: 32.3 g/dL (ref 30.0–36.0)
MCV: 91.1 fL (ref 80.0–100.0)
Monocytes Absolute: 0.5 10*3/uL (ref 0.1–1.0)
Monocytes Relative: 3 %
Neutro Abs: 13.4 10*3/uL — ABNORMAL HIGH (ref 1.7–7.7)
Neutrophils Relative %: 93 %
Platelets: 179 10*3/uL (ref 150–400)
RBC: 4.28 MIL/uL (ref 4.22–5.81)
RDW: 13.4 % (ref 11.5–15.5)
WBC: 14.4 10*3/uL — ABNORMAL HIGH (ref 4.0–10.5)
nRBC: 0 % (ref 0.0–0.2)

## 2022-03-28 LAB — COMPREHENSIVE METABOLIC PANEL
ALT: 23 U/L (ref 0–44)
AST: 21 U/L (ref 15–41)
Albumin: 3.4 g/dL — ABNORMAL LOW (ref 3.5–5.0)
Alkaline Phosphatase: 53 U/L (ref 38–126)
Anion gap: 8 (ref 5–15)
BUN: 21 mg/dL (ref 8–23)
CO2: 24 mmol/L (ref 22–32)
Calcium: 8.9 mg/dL (ref 8.9–10.3)
Chloride: 107 mmol/L (ref 98–111)
Creatinine, Ser: 0.97 mg/dL (ref 0.61–1.24)
GFR, Estimated: >60 mL/min (ref >60)
Glucose, Bld: 149 mg/dL — ABNORMAL HIGH (ref 70–99)
Potassium: 3.9 mmol/L (ref 3.5–5.1)
Sodium: 139 mmol/L (ref 135–145)
Total Bilirubin: 1.1 mg/dL (ref 0.3–1.2)
Total Protein: 6.4 g/dL — ABNORMAL LOW (ref 6.5–8.1)

## 2022-03-28 LAB — D-DIMER, QUANTITATIVE: D-Dimer, Quant: 0.61 ug/mL-FEU — ABNORMAL HIGH (ref 0.00–0.50)

## 2022-03-28 LAB — LACTATE DEHYDROGENASE: LDH: 104 U/L (ref 98–192)

## 2022-03-28 LAB — MAGNESIUM: Magnesium: 1.9 mg/dL (ref 1.7–2.4)

## 2022-03-28 LAB — PROCALCITONIN: Procalcitonin: 1.94 ng/mL

## 2022-03-28 LAB — C-REACTIVE PROTEIN: CRP: 16.8 mg/dL — ABNORMAL HIGH (ref <1.0)

## 2022-03-28 MED ORDER — SODIUM CHLORIDE 0.9 % IV SOLN
2.0000 g | INTRAVENOUS | Status: DC
  Administered 2022-03-28: 2 g via INTRAVENOUS
  Filled 2022-03-28: qty 20

## 2022-03-28 MED ORDER — AZITHROMYCIN 250 MG PO TABS
500.0000 mg | ORAL_TABLET | Freq: Every day | ORAL | Status: DC
  Administered 2022-03-28 – 2022-03-29 (×2): 500 mg via ORAL
  Filled 2022-03-28 (×2): qty 2

## 2022-03-28 NOTE — Progress Notes (Signed)
Progress Note    Dylan Moreno   [id number]  DOB: 01/15/1940  DOA: 03/27/2022     0 PCP: Seward Carol, MD  Initial CC: vomiting, malaise   Hospital Course: Dylan Moreno is an 82 yo male with PMH dementia, ADHD, SCC tongue, dysphagia, GERD, HLD, HTN, nephrolithiasis, TIA who presented from [name] due to vomiting, malaise and new hypoxia.  He was just evaluated in the ER on 03/26/2022 after a witnessed fall at his nursing facility.  He underwent trauma workup and was cleared after negative workup and discharged back to [facility].  Upon repeat evaluation in the ER on 03/27/2022 he was found to be hypoxic in the mid 80s on room air.  He was placed on 3 L oxygen.  He was also febrile, temperature 102.1.  CXR during trauma workup on 03/26/2022 was negative.  Upon repeating CXR on day of admission, there was very subtle developing left lower lobe opacity concerning for infiltrate.  WBC also mildly elevated at 11.5.  COVID testing returned positive.  He was admitted for further COVID treatment and monitoring.  Interval History:  No issues overnight.  Resting in bed comfortably this morning.  Did become hypoxic on room air when trying to be weaned earlier in the morning.  Called and updated son this morning.  We will continue hospitalization in efforts to liberate from oxygen and continue treating COVID and underlying pneumonia.  Assessment and Plan: * COVID-19 virus infection - positive on PCR on admission with new O2 demand and subtle LLL opacity - continue remdesivir and decadron - trend inflammatory markers - PCT elevated and WBC increased; will resume back on abx and monitor clinically  Acute respiratory failure with hypoxia (HCC) - suspected in setting of covid infection and given WBC and PCT now also suspect contribution from LLL infiltrate as well - continue O2, wean as able; not on home O2 - continue steroids  HTN (hypertension) - hold losartan in setting of soft  BPs  Dementia without behavioral disturbance ([place]) - continue aricept  Acquired hypothyroidism - continue synthroid  Hyperlipemia - Continue Lipitor  History of TIA (transient ischemic attack) - Continue Plavix   Old records reviewed in assessment of this patient  Antimicrobials: Remdesivir 3/26 >> current Rocephin 3/26 >> current  Azithro 3/26 >> current  DVT prophylaxis:  enoxaparin (LOVENOX) injection 40 mg Start: 03/28/22 1000   Code Status:   Code Status: Full Code  Mobility Assessment (last 72 hours)     Mobility Assessment     Row Name 03/28/22 0745 03/27/22 2211         Does patient have an order for bedrest or is patient medically unstable No - Continue assessment No - Continue assessment      What is the highest level of mobility based on the progressive mobility assessment? Level 4 (Walks with assist in room) - Balance while marching in place and cannot step forward and back - Complete Level 2 (Chairfast) - Balance while sitting on edge of bed and cannot stand               Barriers to discharge:  Disposition Plan:  Home 1-2 days Status is: Inpt  Objective: Blood pressure 126/68, pulse 76, temperature 98.1 F (36.7 C), temperature source Oral, resp. rate 18, height 6' (1.829 m), weight 72.6 kg, SpO2 96 %.  Examination:  Physical Exam Constitutional:      General: He is not in acute distress.    Appearance: Normal  appearance. He is not ill-appearing.  HENT:     Head: Normocephalic and atraumatic.     Mouth/Throat:     Mouth: Mucous membranes are moist.  Eyes:     Extraocular Movements: Extraocular movements intact.  Cardiovascular:     Rate and Rhythm: Normal rate and regular rhythm.     Heart sounds: Normal heart sounds.  Pulmonary:     Effort: Pulmonary effort is normal. No respiratory distress.     Breath sounds: Rhonchi (LLL) present. No wheezing.  Abdominal:     General: Bowel sounds are normal. There is no distension.      Palpations: Abdomen is soft.     Tenderness: There is no abdominal tenderness.  Musculoskeletal:        General: Normal range of motion.     Cervical back: Normal range of motion and neck supple.  Skin:    General: Skin is warm and dry.  Neurological:     Mental Status: He is alert. Mental status is at baseline. He is disoriented.  Psychiatric:        Mood and Affect: Mood normal.      Consultants:    Procedures:    Data Reviewed: Results for orders placed or performed during the hospital encounter of 03/27/22 (from the past 24 hour(s))  Lactic acid, plasma     Status: None   Collection Time: 03/27/22  7:11 PM  Result Value Ref Range   Lactic Acid, Venous 1.4 0.5 - 1.9 mmol/L  CBC with Differential/Platelet     Status: Abnormal   Collection Time: 03/28/22  4:29 AM  Result Value Ref Range   WBC 14.4 (H) 4.0 - 10.5 K/uL   RBC 4.28 4.22 - 5.81 MIL/uL   Hemoglobin 12.6 (L) 13.0 - 17.0 g/dL   HCT 39.0 39.0 - 52.0 %   MCV 91.1 80.0 - 100.0 fL   MCH 29.4 26.0 - 34.0 pg   MCHC 32.3 30.0 - 36.0 g/dL   RDW 13.4 11.5 - 15.5 %   Platelets 179 150 - 400 K/uL   nRBC 0.0 0.0 - 0.2 %   Neutrophils Relative % 93 %   Neutro Abs 13.4 (H) 1.7 - 7.7 K/uL   Lymphocytes Relative 4 %   Lymphs Abs 0.5 (L) 0.7 - 4.0 K/uL   Monocytes Relative 3 %   Monocytes Absolute 0.5 0.1 - 1.0 K/uL   Eosinophils Relative 0 %   Eosinophils Absolute 0.0 0.0 - 0.5 K/uL   Basophils Relative 0 %   Basophils Absolute 0.0 0.0 - 0.1 K/uL   Immature Granulocytes 0 %   Abs Immature Granulocytes 0.06 0.00 - 0.07 K/uL  Comprehensive metabolic panel     Status: Abnormal   Collection Time: 03/28/22  4:29 AM  Result Value Ref Range   Sodium 139 135 - 145 mmol/L   Potassium 3.9 3.5 - 5.1 mmol/L   Chloride 107 98 - 111 mmol/L   CO2 24 22 - 32 mmol/L   Glucose, Bld 149 (H) 70 - 99 mg/dL   BUN 21 8 - 23 mg/dL   Creatinine, Ser 0.97 0.61 - 1.24 mg/dL   Calcium 8.9 8.9 - 10.3 mg/dL   Total Protein 6.4 (L) 6.5 - 8.1  g/dL   Albumin 3.4 (L) 3.5 - 5.0 g/dL   AST 21 15 - 41 U/L   ALT 23 0 - 44 U/L   Alkaline Phosphatase 53 38 - 126 U/L   Total Bilirubin 1.1 0.3 - 1.2  mg/dL   GFR, Estimated >60 >60 mL/min   Anion gap 8 5 - 15  C-reactive protein     Status: Abnormal   Collection Time: 03/28/22  4:29 AM  Result Value Ref Range   CRP 16.8 (H) <1.0 mg/dL  D-dimer, quantitative     Status: Abnormal   Collection Time: 03/28/22  4:29 AM  Result Value Ref Range   D-Dimer, Quant 0.61 (H) 0.00 - 0.50 ug/mL-FEU  Magnesium     Status: None   Collection Time: 03/28/22  4:29 AM  Result Value Ref Range   Magnesium 1.9 1.7 - 2.4 mg/dL  Lactate dehydrogenase     Status: None   Collection Time: 03/28/22  4:29 AM  Result Value Ref Range   LDH 104 98 - 192 U/L  Procalcitonin     Status: None   Collection Time: 03/28/22  4:29 AM  Result Value Ref Range   Procalcitonin 1.94 ng/mL    I have reviewed pertinent nursing notes, vitals, labs, and images as necessary. I have ordered labwork to follow up on as indicated.  I have reviewed the last notes from staff over past 24 hours. I have discussed patient's care plan and test results with nursing staff, CM/SW, and other staff as appropriate.  Time spent: Greater than 50% of the 55 minute visit was spent in counseling/coordination of care for the patient as laid out in the A&P.   LOS: 0 days   Dwyane Dee, MD [facility] 03/28/2022, 4:40 PM

## 2022-03-28 NOTE — Evaluation (Signed)
Physical Therapy Evaluation Patient Details Name: Dylan Moreno MRN: [id number] DOB: 07/16/1940 Today's Date: 03/28/2022  History of Present Illness  Dylan Moreno is an 82 yo male with PMH dementia, ADHD, SCC tongue, dysphagia, GERD, HLD, HTN, nephrolithiasis, TIA who presented from [place] due to vomiting, malaise and new hypoxia, Positive for Covid, , febrile, LLL opacity concerning for infiltrate.  Clinical Impression  Pt admitted with above diagnosis.  Pt currently with functional limitations due to the deficits listed below (see PT Problem List). Pt will benefit from acute skilled PT to increase their independence and safety with mobility to allow discharge.     The patient is pleasantly confused. No family present to determine  baseline  level of function, or  if patient has 24/7 caregivers.  Patient is not a reliable  source.  Patient reports he lives at [facility] and wife lives in the house.  He uses RW to  ambulate. Continue PT and Dc planning, patient will require supervision for safety.  Spo2 87% on RA to ambulate, 95% on 1.5 L at rest.     Recommendations for follow up therapy are one component of a multi-disciplinary discharge planning process, led by the attending physician.  Recommendations may be updated based on patient status, additional functional criteria and insurance authorization.  Follow Up Recommendations       Assistance Recommended at Discharge Frequent or constant Supervision/Assistance  Patient can return home with the following  A little help with walking and/or transfers;A little help with bathing/dressing/bathroom;Assistance with cooking/housework;Assist for transportation    Equipment Recommendations None recommended by PT  Recommendations for Other Services       Functional Status Assessment Patient has had a recent decline in their functional status and demonstrates the ability to make significant improvements in function in a reasonable and  predictable amount of time.     Precautions / Restrictions Precautions Precautions: Fall Precaution Comments: try to wean O2      Mobility  Bed Mobility Overal bed mobility: Modified Independent                  Transfers Overall transfer level: Needs assistance Equipment used: Rolling walker (2 wheels) Transfers: Sit to/from Stand Sit to Stand: Min guard           General transfer comment: cues for safety    Ambulation/Gait Ambulation/Gait assistance: Min assist Gait Distance (Feet): 30 Feet Assistive device: Rolling walker (2 wheels) Gait Pattern/deviations: Step-to pattern, Step-through pattern, Staggering right, Staggering left Gait velocity: decr     General Gait Details: cues for safe use  of RW  Stairs            Wheelchair Mobility    Modified Rankin (Stroke Patients Only)       Balance Overall balance assessment: History of Falls, Needs assistance Sitting-balance support: No upper extremity supported, Feet supported Sitting balance-Leahy Scale: Fair     Standing balance support: Bilateral upper extremity supported, During functional activity, Reliant on assistive device for balance Standing balance-Leahy Scale: Poor                               Pertinent Vitals/Pain Pain Assessment Pain Assessment: No/denies pain    Home Living Family/patient expects to be discharged to:: Unsure                   Additional Comments: from [place], patient states wife lives in  their house    Prior Function Prior Level of Function : History of Falls (last six months)             Mobility Comments: patient reports ambulates to meals with RW       Hand Dominance   Dominant Hand: Right    Extremity/Trunk Assessment   Upper Extremity Assessment Upper Extremity Assessment: Overall WFL for tasks assessed    Lower Extremity Assessment Lower Extremity Assessment: Overall WFL for tasks assessed    Cervical  / Trunk Assessment Cervical / Trunk Assessment: Normal  Communication   Communication: No difficulties  Cognition Arousal/Alertness: Awake/alert Behavior During Therapy: Impulsive Overall Cognitive Status: No family/caregiver present to determine baseline cognitive functioning Area of Impairment: Orientation, Memory, Safety/judgement                 Orientation Level: Place, Time, Situation   Memory: Decreased short-term memory   Safety/Judgement: Decreased awareness of deficits              General Comments      Exercises     Assessment/Plan    PT Assessment Patient needs continued PT services  PT Problem List Decreased strength;Decreased activity tolerance;Decreased mobility;Decreased cognition;Decreased safety awareness;Cardiopulmonary status limiting activity;Decreased balance       PT Treatment Interventions DME instruction;Therapeutic activities;Cognitive remediation;Gait training;Functional mobility training;Patient/family education    PT Goals (Current goals can be found in the Care Plan section)  Acute Rehab PT Goals Patient Stated Goal: go home PT Goal Formulation: Patient unable to participate in goal setting Time For Goal Achievement: 04/11/22 Potential to Achieve Goals: Good    Frequency Min 3X/week     Co-evaluation               AM-PAC PT "6 Clicks" Mobility  Outcome Measure Help needed turning from your back to your side while in a flat bed without using bedrails?: None Help needed moving from lying on your back to sitting on the side of a flat bed without using bedrails?: None Help needed moving to and from a bed to a chair (including a wheelchair)?: None Help needed standing up from a chair using your arms (e.g., wheelchair or bedside chair)?: A Little Help needed to walk in hospital room?: A Little Help needed climbing 3-5 steps with a railing? : A Lot 6 Click Score: 20    End of Session Equipment Utilized During Treatment:  Gait belt Activity Tolerance: Patient tolerated treatment well Patient left: in bed;with call bell/phone within reach;with bed alarm set Nurse Communication: Mobility status PT Visit Diagnosis: Unsteadiness on feet (R26.81);History of falling (Z91.81)    Time: [id number] PT Time Calculation (min) (ACUTE ONLY): 22 min   Charges:   PT Evaluation $PT Eval Low Complexity: 1 Low          [place] Office [phone] Weekend [id number]   Claretha Cooper 03/28/2022, 4:48 PM

## 2022-03-29 ENCOUNTER — Other Ambulatory Visit: Payer: Self-pay

## 2022-03-29 ENCOUNTER — Other Ambulatory Visit (HOSPITAL_COMMUNITY): Payer: Self-pay

## 2022-03-29 ENCOUNTER — Encounter (HOSPITAL_COMMUNITY): Payer: Self-pay | Admitting: Internal Medicine

## 2022-03-29 DIAGNOSIS — U071 COVID-19: Secondary | ICD-10-CM | POA: Diagnosis not present

## 2022-03-29 DIAGNOSIS — J9601 Acute respiratory failure with hypoxia: Secondary | ICD-10-CM | POA: Diagnosis not present

## 2022-03-29 LAB — COMPREHENSIVE METABOLIC PANEL
ALT: 21 U/L (ref 0–44)
AST: 21 U/L (ref 15–41)
Albumin: 3.2 g/dL — ABNORMAL LOW (ref 3.5–5.0)
Alkaline Phosphatase: 51 U/L (ref 38–126)
Anion gap: 8 (ref 5–15)
BUN: 28 mg/dL — ABNORMAL HIGH (ref 8–23)
CO2: 24 mmol/L (ref 22–32)
Calcium: 8.6 mg/dL — ABNORMAL LOW (ref 8.9–10.3)
Chloride: 109 mmol/L (ref 98–111)
Creatinine, Ser: 1 mg/dL (ref 0.61–1.24)
GFR, Estimated: >60 mL/min (ref >60)
Glucose, Bld: 136 mg/dL — ABNORMAL HIGH (ref 70–99)
Potassium: 4 mmol/L (ref 3.5–5.1)
Sodium: 141 mmol/L (ref 135–145)
Total Bilirubin: 0.8 mg/dL (ref 0.3–1.2)
Total Protein: 5.9 g/dL — ABNORMAL LOW (ref 6.5–8.1)

## 2022-03-29 LAB — CBC WITH DIFFERENTIAL/PLATELET
Abs Immature Granulocytes: 0.05 10*3/uL (ref 0.00–0.07)
Basophils Absolute: 0 10*3/uL (ref 0.0–0.1)
Basophils Relative: 0 %
Eosinophils Absolute: 0 10*3/uL (ref 0.0–0.5)
Eosinophils Relative: 0 %
HCT: 35.9 % — ABNORMAL LOW (ref 39.0–52.0)
Hemoglobin: 11.7 g/dL — ABNORMAL LOW (ref 13.0–17.0)
Immature Granulocytes: 0 %
Lymphocytes Relative: 3 %
Lymphs Abs: 0.4 10*3/uL — ABNORMAL LOW (ref 0.7–4.0)
MCH: 29.3 pg (ref 26.0–34.0)
MCHC: 32.6 g/dL (ref 30.0–36.0)
MCV: 90 fL (ref 80.0–100.0)
Monocytes Absolute: 0.3 10*3/uL (ref 0.1–1.0)
Monocytes Relative: 3 %
Neutro Abs: 10.8 10*3/uL — ABNORMAL HIGH (ref 1.7–7.7)
Neutrophils Relative %: 94 %
Platelets: 192 10*3/uL (ref 150–400)
RBC: 3.99 MIL/uL — ABNORMAL LOW (ref 4.22–5.81)
RDW: 13.2 % (ref 11.5–15.5)
WBC: 11.6 10*3/uL — ABNORMAL HIGH (ref 4.0–10.5)
nRBC: 0 % (ref 0.0–0.2)

## 2022-03-29 LAB — D-DIMER, QUANTITATIVE: D-Dimer, Quant: 0.44 ug/mL-FEU (ref 0.00–0.50)

## 2022-03-29 LAB — C-REACTIVE PROTEIN: CRP: 12.5 mg/dL — ABNORMAL HIGH (ref <1.0)

## 2022-03-29 LAB — MAGNESIUM: Magnesium: 2.1 mg/dL (ref 1.7–2.4)

## 2022-03-29 LAB — LACTATE DEHYDROGENASE: LDH: 110 U/L (ref 98–192)

## 2022-03-29 LAB — PROCALCITONIN: Procalcitonin: 1.38 ng/mL

## 2022-03-29 MED ORDER — AMOXICILLIN 500 MG PO CAPS
1000.0000 mg | ORAL_CAPSULE | Freq: Three times a day (TID) | ORAL | 0 refills | Status: AC
  Filled 2022-03-29: qty 18, 3d supply, fill #0

## 2022-03-29 MED ORDER — DEXAMETHASONE 6 MG PO TABS
6.0000 mg | ORAL_TABLET | ORAL | 0 refills | Status: AC
  Filled 2022-03-29: qty 8, 8d supply, fill #0

## 2022-03-29 MED ORDER — DOXYCYCLINE HYCLATE 100 MG PO TABS
100.0000 mg | ORAL_TABLET | Freq: Two times a day (BID) | ORAL | 0 refills | Status: AC
  Filled 2022-03-29: qty 6, 3d supply, fill #0

## 2022-03-29 MED ORDER — DOXYCYCLINE HYCLATE 100 MG PO TABS
100.0000 mg | ORAL_TABLET | Freq: Two times a day (BID) | ORAL | Status: DC
  Administered 2022-03-29: 100 mg via ORAL
  Filled 2022-03-29: qty 1

## 2022-03-29 MED ORDER — AMOXICILLIN 250 MG PO CAPS
1000.0000 mg | ORAL_CAPSULE | Freq: Three times a day (TID) | ORAL | Status: DC
  Administered 2022-03-29: 1000 mg via ORAL
  Filled 2022-03-29: qty 4

## 2022-03-29 NOTE — Discharge Summary (Signed)
Physician Discharge Summary   Dylan Moreno [id number] DOB: Feb 15, 1940 DOA: 03/27/2022  PCP: Seward Carol, MD  Admit date: 03/27/2022 Discharge date: 03/29/2022  Admitted From: [name] Disposition:  [place] Discharging physician: Dwyane Dee, MD Barriers to discharge: none  Recommendations for Outpatient Follow-up:  Continue chronic medical management  Home Health: PT/OT  Discharge Condition: stable CODE STATUS: Full Diet recommendation:  Diet Orders (From admission, onward)     Start     Ordered   03/29/22 0000  Diet general        03/29/22 1104   03/27/22 1748  Diet regular Room service appropriate? Yes; Fluid consistency: Thin  Diet effective now       Question Answer Comment  Room service appropriate? Yes   Fluid consistency: Thin      03/27/22 1747            Hospital Course: Dylan Moreno is an 82 yo male with PMH dementia, ADHD, SCC tongue, dysphagia, GERD, HLD, HTN, nephrolithiasis, TIA who presented from [facility] due to vomiting, malaise and new hypoxia.  He was just evaluated in the ER on 03/26/2022 after a witnessed fall at his nursing facility.  He underwent trauma workup and was cleared after negative workup and discharged back to [facility].  Upon repeat evaluation in the ER on 03/27/2022 he was found to be hypoxic in the mid 80s on room air.  He was placed on 3 L oxygen.  He was also febrile, temperature 102.1.  CXR during trauma workup on 03/26/2022 was negative.  Upon repeating CXR on day of admission, there was very subtle developing left lower lobe opacity concerning for infiltrate.  WBC also mildly elevated at 11.5.  COVID testing returned positive.  He was admitted for further COVID treatment and monitoring. He was also started on antibiotics due to concern for superimposed bacterial infection.  He had good clinical improvement with treatments and was able to be weaned to room air prior to discharge.  He also ambulated with mobility  specialist with no hypoxia with exertion.  He is continued on steroids to complete at discharge upon returning back to assisted living.  Assessment and Plan: * COVID-19 virus infection - positive on PCR on admission with new O2 demand and subtle LLL opacity -Completed remdesivir x 3 days.  Discharged with remainder course of Decadron  Acute respiratory failure with hypoxia (HCC)-resolved as of 03/29/2022 - suspected in setting of covid infection and given WBC and PCT now also suspect contribution from LLL infiltrate as well -Responded well to steroids.  Oxygen weaned off prior to discharge and confirmed with ambulating walk test  HTN (hypertension) - hold losartan in setting of soft BPs  Dementia without behavioral disturbance ([place]) - continue aricept  Acquired hypothyroidism - continue synthroid  Hyperlipemia - Continue Lipitor  History of TIA (transient ischemic attack) - Continue Plavix   The patient's chronic medical conditions were treated accordingly per the patient's home medication regimen except as noted.  On day of discharge, patient was felt deemed stable for discharge. Patient/family member advised to call PCP or come back to ER if needed.   Principal Diagnosis: COVID-19 virus infection  Discharge Diagnoses: Active Hospital Problems   Diagnosis Date Noted   COVID-19 virus infection 03/27/2022    Priority: 1.   Dementia without behavioral disturbance ([place]) 03/27/2022   HTN (hypertension) 03/27/2022   Acquired hypothyroidism 05/18/2016   History of TIA (transient ischemic attack) 12/29/2012   Hyperlipemia     Resolved  Hospital Problems   Diagnosis Date Noted Date Resolved   Acute respiratory failure with hypoxia ([place]) 03/27/2022 03/29/2022    Priority: 1.     Discharge Instructions     Diet general   Complete by: As directed    Increase activity slowly   Complete by: As directed       Allergies as of 03/29/2022   No Known Allergies      Medication  List     TAKE these medications    acetaminophen 500 MG tablet Commonly known as: TYLENOL Take 1,000 mg by mouth every 8 (eight) hours as needed (for pain).   amoxicillin 500 MG capsule Commonly known as: AMOXIL Take 2 capsules (1,000 mg total) by mouth 3 (three) times daily for 3 days.   atorvastatin 40 MG tablet Commonly known as: LIPITOR Take 40 mg by mouth daily.   clopidogrel 75 MG tablet Commonly known as: PLAVIX Place 1 tablet (75 mg total) into feeding tube daily. What changed: how to take this   dexamethasone 6 MG tablet Commonly known as: DECADRON Take 1 tablet (6 mg total) by mouth daily for 8 days.   donepezil 10 MG tablet Commonly known as: ARICEPT TAKE 1 TABLET BY MOUTH EVERY DAY What changed: when to take this   doxycycline 100 MG tablet Commonly known as: VIBRA-TABS Take 1 tablet (100 mg total) by mouth 2 (two) times daily for 3 days.   fluticasone 50 MCG/ACT nasal spray Commonly known as: FLONASE Place 2 sprays into both nostrils daily as needed for rhinitis.   levothyroxine 112 MCG tablet Commonly known as: SYNTHROID Take 112 mcg by mouth daily before breakfast.   losartan 25 MG tablet Commonly known as: COZAAR Take 25 mg by mouth daily.   OVER THE COUNTER MEDICATION Take 400 mg by mouth See admin instructions. Geri-Tussin SUGAR-FREE/AF 100 mg/5 ml's: Take 400 mg by mouth twice a day as needed for cough and congestion   tamsulosin 0.4 MG Caps capsule Commonly known as: FLOMAX Take 0.4 mg by mouth daily.   thiamine 100 MG tablet Commonly known as: VITAMIN B1 Take 100 mg by mouth daily.        No Known Allergies  Consultations: none  Procedures: none  Discharge Exam: BP 111/64 (BP Location: Left Arm)   Pulse 67   Temp 98 F (36.7 C) (Oral)   Resp 20   Ht 6' (1.829 m)   Wt 72.6 kg   SpO2 93%   BMI 21.71 kg/m  Physical Exam Constitutional:      General: He is not in acute distress.    Appearance: Normal appearance. He  is not ill-appearing.  HENT:     Head: Normocephalic and atraumatic.     Mouth/Throat:     Mouth: Mucous membranes are moist.  Eyes:     Extraocular Movements: Extraocular movements intact.  Cardiovascular:     Rate and Rhythm: Normal rate and regular rhythm.     Heart sounds: Normal heart sounds.  Pulmonary:     Effort: Pulmonary effort is normal. No respiratory distress.     Breath sounds: Rhonchi (LLL) present. No wheezing.  Abdominal:     General: Bowel sounds are normal. There is no distension.     Palpations: Abdomen is soft.     Tenderness: There is no abdominal tenderness.  Musculoskeletal:        General: Normal range of motion.     Cervical back: Normal range of motion and neck supple.  Skin:    General: Skin is warm and dry.  Neurological:     Mental Status: He is alert. Mental status is at baseline. He is disoriented.  Psychiatric:        Mood and Affect: Mood normal.      The results of significant diagnostics from this hospitalization (including imaging, microbiology, ancillary and laboratory) are listed below for reference.   Microbiology: Recent Results (from the past 240 hour(s))  Blood Culture (routine x 2)     Status: None (Preliminary result)   Collection Time: 03/27/22  2:30 PM   Specimen: BLOOD RIGHT ARM  Result Value Ref Range Status   Specimen Description   Final    BLOOD RIGHT ARM Performed at [facility], [place] [address] [place], [place] [address]    Special Requests   Final    BOTTLES DRAWN AEROBIC AND ANAEROBIC Blood Culture adequate volume Performed at [facility] [address], [place], [place] [address]    Culture   Final    NO GROWTH 2 DAYS Performed at [facility] [address] [place], [place] [address]    Report Status PENDING  Incomplete  Blood Culture (routine x 2)     Status: None (Preliminary result)   Collection Time: 03/27/22  2:30 PM   Specimen: BLOOD LEFT ARM  Result  Value Ref Range Status   Specimen Description   Final    BLOOD LEFT ARM Performed at [facility] [address], [place], [place] [address]    Special Requests   Final    BOTTLES DRAWN AEROBIC AND ANAEROBIC Blood Culture adequate volume Performed at [facility] [address], [place], [place] [address]    Culture   Final    NO GROWTH 2 DAYS Performed at [facility] [address], [place], [place] [address]    Report Status PENDING  Incomplete  Resp panel by RT-PCR (RSV, Flu A&B, Covid) Anterior Nasal Swab     Status: Abnormal   Collection Time: 03/27/22  2:43 PM   Specimen: Anterior Nasal Swab  Result Value Ref Range Status   SARS Coronavirus 2 by RT PCR POSITIVE (A) NEGATIVE Final    Comment: (NOTE) SARS-CoV-2 target nucleic acids are DETECTED.  The SARS-CoV-2 RNA is generally detectable in upper respiratory specimens during the acute phase of infection. Positive results are indicative of the presence of the identified virus, but do not rule out bacterial infection or co-infection with other pathogens not detected by the test. Clinical correlation with patient history and other diagnostic information is necessary to determine patient infection status. The expected result is Negative.  Fact Sheet for Patients: EntrepreneurPulse.com.au  Fact Sheet for Healthcare Providers: IncredibleEmployment.be  This test is not yet approved or cleared by the [place] FDA and  has been authorized for detection and/or diagnosis of SARS-CoV-2 by FDA under an Emergency Use Authorization (EUA).  This EUA will remain in effect (meaning this test can be used) for the duration of  the COVID-19 declaration under Section 564(b)(1) of the A ct, 21 U.S.C. section 360bbb-3(b)(1), unless the authorization is terminated or revoked sooner.     Influenza A by PCR NEGATIVE NEGATIVE Final   Influenza B by PCR  NEGATIVE NEGATIVE Final    Comment: (NOTE) The Xpert Xpress SARS-CoV-2/FLU/RSV plus assay is intended as an aid in the diagnosis of influenza from Nasopharyngeal swab specimens and should not be used as a  sole basis for treatment. Nasal washings and aspirates are unacceptable for Xpert Xpress SARS-CoV-2/FLU/RSV testing.  Fact Sheet for Patients: EntrepreneurPulse.com.au  Fact Sheet for Healthcare Providers: IncredibleEmployment.be  This test is not yet approved or cleared by the [place] FDA and has been authorized for detection and/or diagnosis of SARS-CoV-2 by FDA under an Emergency Use Authorization (EUA). This EUA will remain in effect (meaning this test can be used) for the duration of the COVID-19 declaration under Section 564(b)(1) of the Act, 21 U.S.C. section 360bbb-3(b)(1), unless the authorization is terminated or revoked.     Resp Syncytial Virus by PCR NEGATIVE NEGATIVE Final    Comment: (NOTE) Fact Sheet for Patients: EntrepreneurPulse.com.au  Fact Sheet for Healthcare Providers: IncredibleEmployment.be  This test is not yet approved or cleared by the [place] FDA and has been authorized for detection and/or diagnosis of SARS-CoV-2 by FDA under an Emergency Use Authorization (EUA). This EUA will remain in effect (meaning this test can be used) for the duration of the COVID-19 declaration under Section 564(b)(1) of the Act, 21 U.S.C. section 360bbb-3(b)(1), unless the authorization is terminated or revoked.  Performed at [facility], [place] [address], [place], [place] [address]      Labs: BNP (last 3 results) No results for input(s): "BNP" in the last 8760 hours. Basic Metabolic Panel: Recent Labs  Lab 03/26/22 1543 03/26/22 1552 03/27/22 1430 03/28/22 0429 03/29/22 0445  NA 138 138 140 139 141  K 4.2 4.2 4.4 3.9 4.0  CL 105 107 103 107 109  CO2 21*   --  26 24 24   GLUCOSE 134* 134* 127* 149* 136*  BUN 19 23 23 21  28*  CREATININE 1.33* 1.30* 1.15 0.97 1.00  CALCIUM 8.9  --  9.1 8.9 8.6*  MG  --   --   --  1.9 2.1   Liver Function Tests: Recent Labs  Lab 03/26/22 1543 03/27/22 1430 03/28/22 0429 03/29/22 0445  AST 23 23 21 21   ALT 31 27 23 21   ALKPHOS 74 72 53 51  BILITOT 1.9* 2.4* 1.1 0.8  PROT 6.5 7.3 6.4* 5.9*  ALBUMIN 3.7 4.2 3.4* 3.2*   No results for input(s): "LIPASE", "AMYLASE" in the last 168 hours. No results for input(s): "AMMONIA" in the last 168 hours. CBC: Recent Labs  Lab 03/26/22 1543 03/26/22 1552 03/27/22 1430 03/28/22 0429 03/29/22 0445  WBC 11.3*  --  11.5* 14.4* 11.6*  NEUTROABS 10.1*  --  10.3* 13.4* 10.8*  HGB 13.7 13.9 14.0 12.6* 11.7*  HCT 43.3 41.0 43.2 39.0 35.9*  MCV 92.5  --  91.1 91.1 90.0  PLT 195  --  199 179 192   Cardiac Enzymes: No results for input(s): "CKTOTAL", "CKMB", "CKMBINDEX", "TROPONINI" in the last 168 hours. BNP: Invalid input(s): "POCBNP" CBG: No results for input(s): "GLUCAP" in the last 168 hours. D-Dimer Recent Labs    03/28/22 0429 03/29/22 0445  DDIMER 0.61* 0.44   Hgb A1c No results for input(s): "HGBA1C" in the last 72 hours. Lipid Profile No results for input(s): "CHOL", "HDL", "LDLCALC", "TRIG", "CHOLHDL", "LDLDIRECT" in the last 72 hours. Thyroid function studies No results for input(s): "TSH", "T4TOTAL", "T3FREE", "THYROIDAB" in the last 72 hours.  Invalid input(s): "FREET3" Anemia work up No results for input(s): "VITAMINB12", "FOLATE", "FERRITIN", "TIBC", "IRON", "RETICCTPCT" in the last 72 hours. Urinalysis    Component Value Date/Time   COLORURINE YELLOW 03/26/2022 1920   APPEARANCEUR CLEAR 03/26/2022 1920   LABSPEC 1.019 03/26/2022 1920  LABSPEC 1.015 07/21/2014 1012   PHURINE 5.0 03/26/2022 1920   GLUCOSEU NEGATIVE 03/26/2022 1920   GLUCOSEU Negative 07/21/2014 1012   HGBUR NEGATIVE 03/26/2022 1920   BILIRUBINUR NEGATIVE  03/26/2022 1920   BILIRUBINUR Negative 07/21/2014 1012   KETONESUR 20 (A) 03/26/2022 1920   PROTEINUR NEGATIVE 03/26/2022 1920   UROBILINOGEN 0.2 07/21/2014 1012   NITRITE NEGATIVE 03/26/2022 1920   LEUKOCYTESUR TRACE (A) 03/26/2022 1920   LEUKOCYTESUR Negative 07/21/2014 1012   Sepsis Labs Recent Labs  Lab 03/26/22 1543 03/27/22 1430 03/28/22 0429 03/29/22 0445  WBC 11.3* 11.5* 14.4* 11.6*   Microbiology Recent Results (from the past 240 hour(s))  Blood Culture (routine x 2)     Status: None (Preliminary result)   Collection Time: 03/27/22  2:30 PM   Specimen: BLOOD RIGHT ARM  Result Value Ref Range Status   Specimen Description   Final    BLOOD RIGHT ARM Performed at [facility], [place] [address], [place], [place] [address]    Special Requests   Final    BOTTLES DRAWN AEROBIC AND ANAEROBIC Blood Culture adequate volume Performed at [facility] [address], [place], [place] [address]    Culture   Final    NO GROWTH 2 DAYS Performed at [facility] [address] [place], [place] [address]    Report Status PENDING  Incomplete  Blood Culture (routine x 2)     Status: None (Preliminary result)   Collection Time: 03/27/22  2:30 PM   Specimen: BLOOD LEFT ARM  Result Value Ref Range Status   Specimen Description   Final    BLOOD LEFT ARM Performed at [facility] [address], [place], [place] [address]    Special Requests   Final    BOTTLES DRAWN AEROBIC AND ANAEROBIC Blood Culture adequate volume Performed at [facility] [address] [place], [place] [address]    Culture   Final    NO GROWTH 2 DAYS Performed at [facility] [address], [place],  [address]    Report Status PENDING  Incomplete  Resp panel by RT-PCR (RSV, Flu A&B, Covid) Anterior Nasal Swab     Status: Abnormal   Collection Time: 03/27/22  2:43 PM   Specimen: Anterior Nasal Swab  Result  Value Ref Range Status   SARS Coronavirus 2 by RT PCR POSITIVE (A) NEGATIVE Final    Comment: (NOTE) SARS-CoV-2 target nucleic acids are DETECTED.  The SARS-CoV-2 RNA is generally detectable in upper respiratory specimens during the acute phase of infection. Positive results are indicative of the presence of the identified virus, but do not rule out bacterial infection or co-infection with other pathogens not detected by the test. Clinical correlation with patient history and other diagnostic information is necessary to determine patient infection status. The expected result is Negative.  Fact Sheet for Patients: EntrepreneurPulse.com.au  Fact Sheet for Healthcare Providers: IncredibleEmployment.be  This test is not yet approved or cleared by the [place] FDA and  has been authorized for detection and/or diagnosis of SARS-CoV-2 by FDA under an Emergency Use Authorization (EUA).  This EUA will remain in effect (meaning this test can be used) for the duration of  the COVID-19 declaration under Section 564(b)(1) of the A ct, 21 U.S.C. section 360bbb-3(b)(1), unless the authorization is terminated or revoked sooner.     Influenza A by PCR NEGATIVE NEGATIVE Final   Influenza B by PCR NEGATIVE NEGATIVE  Final    Comment: (NOTE) The Xpert Xpress SARS-CoV-2/FLU/RSV plus assay is intended as an aid in the diagnosis of influenza from Nasopharyngeal swab specimens and should not be used as a sole basis for treatment. Nasal washings and aspirates are unacceptable for Xpert Xpress SARS-CoV-2/FLU/RSV testing.  Fact Sheet for Patients: EntrepreneurPulse.com.au  Fact Sheet for Healthcare Providers: IncredibleEmployment.be  This test is not yet approved or cleared by the [place] FDA and has been authorized for detection and/or diagnosis of SARS-CoV-2 by FDA under an Emergency Use Authorization (EUA). This EUA  will remain in effect (meaning this test can be used) for the duration of the COVID-19 declaration under Section 564(b)(1) of the Act, 21 U.S.C. section 360bbb-3(b)(1), unless the authorization is terminated or revoked.     Resp Syncytial Virus by PCR NEGATIVE NEGATIVE Final    Comment: (NOTE) Fact Sheet for Patients: EntrepreneurPulse.com.au  Fact Sheet for Healthcare Providers: IncredibleEmployment.be  This test is not yet approved or cleared by the [place] FDA and has been authorized for detection and/or diagnosis of SARS-CoV-2 by FDA under an Emergency Use Authorization (EUA). This EUA will remain in effect (meaning this test can be used) for the duration of the COVID-19 declaration under Section 564(b)(1) of the Act, 21 U.S.C. section 360bbb-3(b)(1), unless the authorization is terminated or revoked.  Performed at [facility], [place] [address] [place],  [address]     Procedures/Studies: US Abdomen Limited RUQ (LIVER/GB)  Result Date: 03/27/2022 CLINICAL DATA:  Elevated bilirubin EXAM: ULTRASOUND ABDOMEN LIMITED RIGHT UPPER QUADRANT COMPARISON:  None Available. FINDINGS: Evaluation is somewhat limited as the patient could not be properly positioned. Gallbladder: No cholelithiasis. The gallbladder wall measures up to 4 mm, just above the upper limit of normal. No pericholecystic fluid. No sonographic Murphy sign noted by sonographer. Common bile duct: Diameter: 3 mm, within normal limits. No intrahepatic biliary ductal dilatation. Liver: No focal lesion identified. Within normal limits in parenchymal echogenicity. Portal vein is patent on color Doppler imaging with normal direction of blood flow towards the liver. Other: None. IMPRESSION: The gallbladder wall measures up to 4 mm, just above the upper limit of normal. No cholelithiasis, pericholecystic fluid, or sonographic Murphy sign. Findings are equivocal for  acute cholecystitis. Electronically Signed   By: Merilyn Baba M.D.   On: 03/27/2022 17:04   DG Chest Port 1 View  Result Date: 03/27/2022 CLINICAL DATA:  Sepsis. EXAM: PORTABLE CHEST 1 VIEW COMPARISON:  Chest x-ray 03/26/2022 FINDINGS: Underinflation with developing subtle opacity at the left lung base. Acute infiltrates possible. Recommend follow-up. No pneumothorax, effusion or edema. Normal cardiopericardial silhouette. Overlapping cardiac leads IMPRESSION: Subtle developing opacity left lung base. Infiltrate is possible. Recommend follow-up Electronically Signed   By: Jill Side M.D.   On: 03/27/2022 15:22   DG Chest 2 View  Result Date: 03/26/2022 CLINICAL DATA:  Fall. EXAM: CHEST - 2 VIEW COMPARISON:  May 28, 2014. FINDINGS: The heart size and mediastinal contours are within normal limits. Both lungs are clear. The visualized skeletal structures are unremarkable. IMPRESSION: No active cardiopulmonary disease. Electronically Signed   By: Marijo Conception M.D.   On: 03/26/2022 16:54   CT Head Wo Contrast  Result Date: 03/26/2022 CLINICAL DATA:  Unwitnessed fall, found on floor, dementia EXAM: CT HEAD WITHOUT CONTRAST CT CERVICAL SPINE WITHOUT CONTRAST TECHNIQUE: Multidetector CT imaging of the head and cervical spine was performed following the standard protocol without intravenous contrast. Multiplanar CT image reconstructions of the cervical spine  were also generated. RADIATION DOSE REDUCTION: This exam was performed according to the departmental dose-optimization program which includes automated exposure control, adjustment of the mA and/or kV according to patient size and/or use of iterative reconstruction technique. COMPARISON:  04/17/2018 FINDINGS: CT HEAD FINDINGS Brain: No evidence of acute infarction, hemorrhage, extra-axial collection or mass lesion/mass effect. Unchanged prominence of the bilateral lateral and third ventricles. Vascular: No hyperdense vessel or unexpected calcification.  Skull: Normal. Negative for fracture or focal lesion. Sinuses/Orbits: No acute finding. Other: None. CT CERVICAL SPINE FINDINGS Alignment: Normal. Skull base and vertebrae: No acute fracture. No primary bone lesion or focal pathologic process. Soft tissues and spinal canal: No prevertebral fluid or swelling. No visible canal hematoma. Disc levels: Moderate disc space height loss and osteophytosis, worst at C5-C7. Upper chest: Negative. Other: None. IMPRESSION: 1. No acute intracranial pathology. 2. Unchanged prominence of the bilateral lateral and third ventricles. This appearance is most likely related to global cerebral volume loss, but can be seen in normal pressure hydrocephalus. 3. No fracture or static subluxation of the cervical spine. 4. Moderate multilevel cervical disc degenerative disease. Electronically Signed   By: Delanna Ahmadi M.D.   On: 03/26/2022 16:19   CT Cervical Spine Wo Contrast  Result Date: 03/26/2022 CLINICAL DATA:  Unwitnessed fall, found on floor, dementia EXAM: CT HEAD WITHOUT CONTRAST CT CERVICAL SPINE WITHOUT CONTRAST TECHNIQUE: Multidetector CT imaging of the head and cervical spine was performed following the standard protocol without intravenous contrast. Multiplanar CT image reconstructions of the cervical spine were also generated. RADIATION DOSE REDUCTION: This exam was performed according to the departmental dose-optimization program which includes automated exposure control, adjustment of the mA and/or kV according to patient size and/or use of iterative reconstruction technique. COMPARISON:  04/17/2018 FINDINGS: CT HEAD FINDINGS Brain: No evidence of acute infarction, hemorrhage, extra-axial collection or mass lesion/mass effect. Unchanged prominence of the bilateral lateral and third ventricles. Vascular: No hyperdense vessel or unexpected calcification. Skull: Normal. Negative for fracture or focal lesion. Sinuses/Orbits: No acute finding. Other: None. CT CERVICAL SPINE  FINDINGS Alignment: Normal. Skull base and vertebrae: No acute fracture. No primary bone lesion or focal pathologic process. Soft tissues and spinal canal: No prevertebral fluid or swelling. No visible canal hematoma. Disc levels: Moderate disc space height loss and osteophytosis, worst at C5-C7. Upper chest: Negative. Other: None. IMPRESSION: 1. No acute intracranial pathology. 2. Unchanged prominence of the bilateral lateral and third ventricles. This appearance is most likely related to global cerebral volume loss, but can be seen in normal pressure hydrocephalus. 3. No fracture or static subluxation of the cervical spine. 4. Moderate multilevel cervical disc degenerative disease. Electronically Signed   By: Delanna Ahmadi M.D.   On: 03/26/2022 16:19     Time coordinating discharge: Over 30 minutes    Dwyane Dee, MD  [facility] 03/29/2022, 11:37 AM

## 2022-03-29 NOTE — TOC Transition Note (Addendum)
Transition of Care [facility]) - CM/SW Discharge Note   Patient Details  Name: Dylan Moreno MRN: [id number] Date of Birth: 1940-08-26  Transition of Care [facility]) CM/SW Contact:  Illene Regulus, LCSW Phone Number: 03/29/2022, 11:47 AM   Clinical Narrative:    CSW has faxed pt's FL2 out to facility awaiting for response. TOC to follow.    1:00pm Received call from [facility] , pt can return. D/c summary faxed.left VM with pt's wife Caryn Section called no additional needs TOC sign off.    Barriers to Discharge: Continued Medical Work up   Patient Goals and CMS Choice CMS Medicare.gov Compare Post Acute Care list provided to:: Patient Represenative (must comment) Choice offered to / list presented to : Spouse  Discharge Placement                         Discharge Plan and Services Additional resources added to the After Visit Summary for   In-house Referral: Clinical Social Work   Post Acute Care Choice: Home Health                    HH Arranged: PT, OT [place] Agency:  Secondary school teacher) Date [place]: 03/29/22 Time [place]: 1036 Representative spoke with at [place]: [place] Determinants of Health ([place]) Interventions SDOH Screenings   Food Insecurity: No Food Insecurity (03/27/2022)  Housing: Low Risk  (03/27/2022)  Transportation Needs: No Transportation Needs (03/27/2022)  Utilities: Not At Risk (03/27/2022)  Tobacco Use: Medium Risk (03/29/2022)     Readmission Risk Interventions     No data to display

## 2022-03-29 NOTE — TOC Initial Note (Signed)
Transition of Care [facility]) - Initial/Assessment Note    Patient Details  Name: Dylan Moreno MRN: [id number] Date of Birth: 01/31/1940  [facility]) CM/SW Contact:    Illene Regulus, LCSW Phone Number: 03/29/2022, 10:37 AM  Clinical Narrative:                 CSW spoke with pt's [name] , she reports pt is from [facility]. Discussed Home Health recommendations, she has agreed to [facility] services. Pt's wife prefers [employer] , pt has had the agency in the past. Will need [place] orders MD made aware. TOC to follow for d/c needs.   Expected Discharge Plan: Assisted Living Barriers to Discharge: Continued Medical Work up   Patient Goals and CMS Choice Patient states their goals for this hospitalization and ongoing recovery are:: return to ALF with home health CMS Medicare.gov Compare Post Acute Care list provided to:: Patient Represenative (must comment) Choice offered to / list presented to : Spouse      Expected Discharge Plan and Services In-house Referral: Clinical Social Work   Post Acute Care Choice: [place] arrangements for the past 2 months: [place]: PT, OT [place] Agency:  Secondary school teacher) Date [place]: 03/29/22 Time [place]: 1036 Representative spoke with at [place] Arrangements/Services Living arrangements for the past 2 months: [place] Lives with:: Self Patient language and need for interpreter reviewed:: Yes Do you feel safe going back to the place where you live?: Yes      Need for Family Participation in Patient Care: No (Comment) Care giver support system in place?: No (comment) Current home services: DME Criminal Activity/Legal Involvement Pertinent to Current Situation/Hospitalization: No - Comment as needed  Activities of Daily Living Home Assistive Devices/Equipment: Walker (specify type) ADL Screening (condition at  time of admission) Patient's cognitive ability adequate to safely complete daily activities?: Yes Is the patient deaf or have difficulty hearing?: No Does the patient have difficulty seeing, even when wearing glasses/contacts?: No Does the patient have difficulty concentrating, remembering, or making decisions?: Yes Patient able to express need for assistance with ADLs?: Yes Does the patient have difficulty dressing or bathing?: Yes Independently performs ADLs?: No Communication: Independent Dressing (OT): Independent Is this a change from baseline?: Pre-admission baseline Grooming: Independent Feeding: Independent Bathing: Independent Toileting: Independent, Independent with device (comment) In/Out Bed: Independent, Independent with device (comment) Walks in Home: Independent, Independent with device (comment) Does the patient have difficulty walking or climbing stairs?: Yes Weakness of Legs: None Weakness of Arms/Hands: None  Permission Sought/Granted                  Emotional Assessment Appearance:: Appears stated age Attitude/Demeanor/Rapport: Gracious Affect (typically observed): Accepting Orientation: : Oriented to Self, Oriented to Place, Oriented to  Time, Oriented to Situation   Psych Involvement: No (comment)  Admission diagnosis:  Acute hypoxemic respiratory failure due to COVID-19 (HCC) [U07.1, J96.01] Nausea and vomiting, unspecified vomiting type [R11.2] COVID-19 virus infection [U07.1] Patient Active Problem List   Diagnosis Date Noted   COVID-19 virus infection 03/27/2022   Acute respiratory failure with hypoxia ([place]) 03/27/2022   Dementia without behavioral disturbance ([place]) 03/27/2022   HTN (hypertension) 03/27/2022   Major neurocognitive disorder ([place]) 01/22/2019   Bilateral impacted cerumen 06/04/2017  Hx of tongue cancer 06/04/2017   Solitary pulmonary nodule on lung CT 05/18/2016   Acquired hypothyroidism 05/18/2016   Bilateral sensorineural  hearing loss 10/25/2015   Presbycusis of both ears 10/25/2015   Dry mouth 12/22/2014   Balance disorder 09/22/2014   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   Dysuria 07/15/2014   Dysphagia 06/17/2014   Cerebral ventriculomegaly 06/08/2014   Alcohol abuse 06/08/2014   Dilated pupil 06/04/2014   Aspiration into airway    Acute delirium 06/02/2014   Tongue swelling    Hyponatremia    Protein-calorie malnutrition, severe ([place]) 05/26/2014   Subacute delirium 05/25/2014   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 [place] [address] Phone: [phone] Fax: [phone]     Social Determinants of Health (SDOH) Social History: SDOH Screenings   Food Insecurity: No Food Insecurity (03/27/2022)  Housing: Low Risk  (03/27/2022)  Transportation Needs: No Transportation Needs (03/27/2022)  Utilities: Not At Risk (03/27/2022)  Tobacco Use: Medium Risk (03/29/2022)   SDOH Interventions:     Readmission Risk Interventions     No data to display

## 2022-03-29 NOTE — NC FL2 (Signed)
[employer] LEVEL OF CARE FORM     IDENTIFICATION  Patient Name: Dylan Moreno Birthdate: 1940/10/02 Sex: male Admission Date (Current Location): 03/27/2022  [facility] and [place] Number:  Herbalist and Address:  [facility],  [place], [place]      Provider Number: [id number]  Attending Physician Name and Address:  Dwyane Dee, MD  Relative Name and Phone Number:  Mirac, Mctiernan (Spouse) [phone] (Mobile)    Current Level of Care: Hospital Recommended Level of Care: Assisted Living Facility Prior Approval Number:    Date Approved/Denied:   PASRR Number:    Discharge Plan: Other (Comment) (ALF)    Current Diagnoses: Patient Active Problem List   Diagnosis Date Noted   COVID-19 virus infection 03/27/2022   Acute respiratory failure with hypoxia ([place]) 03/27/2022   Dementia without behavioral disturbance ([place]) 03/27/2022   HTN (hypertension) 03/27/2022   Major neurocognitive disorder ([place]) 01/22/2019   Bilateral impacted cerumen 06/04/2017   Hx of tongue cancer 06/04/2017   Solitary pulmonary nodule on lung CT 05/18/2016   Acquired hypothyroidism 05/18/2016   Bilateral sensorineural hearing loss 10/25/2015   Presbycusis of both ears 10/25/2015   Dry mouth 12/22/2014   Balance disorder 09/22/2014   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   Dysuria 07/15/2014   Dysphagia 06/17/2014   Cerebral ventriculomegaly 06/08/2014   Alcohol abuse 06/08/2014   Dilated pupil 06/04/2014   Aspiration into airway    Acute delirium 06/02/2014   Tongue swelling    Hyponatremia    Protein-calorie malnutrition, severe (HCC) 05/26/2014   Subacute delirium 05/25/2014   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   Confusion caused by a drug 05/20/2014   Protein calorie malnutrition ([place]) 05/20/2014   Insomnia 05/10/2014   Leukopenia due to antineoplastic chemotherapy ([place]) 04/28/2014   Mucositis due to chemotherapy 04/22/2014    Dehydration 04/22/2014   Weight loss 04/14/2014   Chemotherapy-induced nausea 04/14/2014   Mild to moderate hearing loss 03/18/2014   Malignant neoplasm of base of tongue ([place]) 03/17/2014   History of TIA (transient ischemic attack) 12/29/2012   Hyperlipemia    ADHD (attention deficit hyperactivity disorder)     Orientation RESPIRATION BLADDER Height & Weight     Self, Place  Normal Incontinent Weight: 160 lb 0.9 oz (72.6 kg) Height:  6' (182.9 cm)  BEHAVIORAL SYMPTOMS/MOOD NEUROLOGICAL BOWEL NUTRITION STATUS      Continent Diet (regular)  AMBULATORY STATUS COMMUNICATION OF NEEDS Skin   Supervision Verbally Normal                       Personal Care Assistance Level of Assistance  Bathing, Feeding, Dressing Bathing Assistance: Limited assistance Feeding assistance: Independent Dressing Assistance: Limited assistance     Functional Limitations Info  Sight, Hearing, Speech Sight Info: Adequate Hearing Info: Adequate Speech Info: Adequate    SPECIAL CARE FACTORS FREQUENCY                       Contractures Contractures Info: Not present    Additional Factors Info  Code Status, Allergies Code Status Info: full Allergies Info: no known allergies           Current Medications (03/29/2022):  This is the current hospital active medication list Current Facility-Administered Medications  Medication Dose Route Frequency Provider Last Rate Last Admin   acetaminophen (TYLENOL) tablet 650 mg  650 mg Oral Q6H PRN Dwyane Dee, MD  Or   acetaminophen (TYLENOL) suppository 650 mg  650 mg Rectal Q6H PRN Dwyane Dee, MD       albuterol (PROVENTIL) (2.5 MG/3ML) 0.083% nebulizer solution 2.5 mg  2.5 mg Nebulization Q2H PRN Dwyane Dee, MD       amoxicillin (AMOXIL) capsule 1,000 mg  1,000 mg Oral TID Dwyane Dee, MD       atorvastatin (LIPITOR) tablet 40 mg  40 mg Oral Daily Dwyane Dee, MD   40 mg at 03/29/22 0900   clopidogrel (PLAVIX) tablet 75 mg   75 mg Oral Daily Dwyane Dee, MD   75 mg at 03/29/22 0900   dexamethasone (DECADRON) tablet 6 mg  6 mg Oral Q24H Dwyane Dee, MD   6 mg at 03/28/22 1804   donepezil (ARICEPT) tablet 10 mg  10 mg Oral Standley Brooking, MD   10 mg at 03/28/22 2123   doxycycline (VIBRA-TABS) tablet 100 mg  100 mg Oral Q12H Dwyane Dee, MD       enoxaparin (LOVENOX) injection 40 mg  40 mg Subcutaneous Daily Dwyane Dee, MD   40 mg at 03/29/22 [id number]   levothyroxine (SYNTHROID) tablet 112 mcg  112 mcg Oral Q0600 Dwyane Dee, MD   112 mcg at 03/29/22 0601   ondansetron (ZOFRAN) tablet 4 mg  4 mg Oral Q6H PRN Dwyane Dee, MD       Or   ondansetron [facility]) injection 4 mg  4 mg Intravenous Q6H PRN Dwyane Dee, MD       polyethylene glycol (MIRALAX / GLYCOLAX) packet 17 g  17 g Oral Daily PRN Dwyane Dee, MD       sodium chloride flush (NS) 0.9 % injection 3 mL  3 mL Intravenous Eddie Candle, MD   3 mL at 03/29/22 0900   tamsulosin (FLOMAX) capsule 0.4 mg  0.4 mg Oral Daily Dwyane Dee, MD   0.4 mg at 03/29/22 0900     Discharge Medications: Please see discharge summary for a list of discharge medications.  Relevant Imaging Results:  Relevant Lab Results:   Additional Information [id number]  [place], LCSW

## 2022-03-29 NOTE — Progress Notes (Signed)
Report called to [place], facility care coordinator. Pt left via PTAR. Face mask in place. Educated on proper use of face mask. Pt verbalized understanding; although, due to forgetfulness will need reinforcing. IV removed prior to DC.

## 2022-03-29 NOTE — Progress Notes (Signed)
Mobility Specialist - Progress Note   03/29/22 1051  Oxygen Therapy  SpO2 93 %  O2 Device Room Air  Patient Activity (if Appropriate) Ambulating  Mobility  Activity Ambulated with assistance in hallway  Level of Assistance Standby assist, set-up cues, supervision of patient - no hands on  Assistive Device Front wheel walker  Distance Ambulated (ft) 160 ft  Activity Response Tolerated well  Mobility Referral Yes  $Mobility charge 1 Mobility   MD requested Mobility Specialist to perform oxygen saturation test. Below are the results from that testing.     Patient Saturations on Room Air at Rest = spO2 95%  Patient Saturations on Room Air while Ambulating = sp02 93% .   Patient Saturations on 0 Liters of oxygen while Ambulating = sp02 93%  At end of testing pt left in room on 0  Liters of oxygen.  Reported results to MD.  Pt received in bed & agreeable to ambulate. No complaints during session. Pt to bed at EOS w/ all necessities in reach.  Pre-mobility: 70 HR, 95% SpO2 (RA) During mobility: 100 HR, 93% SpO2 (RA) Post-mobility: 81 HR, 95%  SPO2 (RA)  Set designer

## 2022-04-01 LAB — CULTURE, BLOOD (ROUTINE X 2)
Culture: NO GROWTH
Culture: NO GROWTH
Special Requests: ADEQUATE
Special Requests: ADEQUATE

## 2022-04-17 DIAGNOSIS — R2689 Other abnormalities of gait and mobility: Secondary | ICD-10-CM | POA: Diagnosis not present

## 2022-04-17 DIAGNOSIS — R296 Repeated falls: Secondary | ICD-10-CM | POA: Diagnosis not present

## 2022-04-17 DIAGNOSIS — U071 COVID-19: Secondary | ICD-10-CM | POA: Diagnosis not present

## 2022-04-17 DIAGNOSIS — M6281 Muscle weakness (generalized): Secondary | ICD-10-CM | POA: Diagnosis not present

## 2022-04-17 DIAGNOSIS — M6259 Muscle wasting and atrophy, not elsewhere classified, multiple sites: Secondary | ICD-10-CM | POA: Diagnosis not present

## 2022-04-17 DIAGNOSIS — R2681 Unsteadiness on feet: Secondary | ICD-10-CM | POA: Diagnosis not present

## 2022-04-19 DIAGNOSIS — M6259 Muscle wasting and atrophy, not elsewhere classified, multiple sites: Secondary | ICD-10-CM | POA: Diagnosis not present

## 2022-04-19 DIAGNOSIS — R2681 Unsteadiness on feet: Secondary | ICD-10-CM | POA: Diagnosis not present

## 2022-04-19 DIAGNOSIS — M6281 Muscle weakness (generalized): Secondary | ICD-10-CM | POA: Diagnosis not present

## 2022-04-19 DIAGNOSIS — U071 COVID-19: Secondary | ICD-10-CM | POA: Diagnosis not present

## 2022-04-19 DIAGNOSIS — R2689 Other abnormalities of gait and mobility: Secondary | ICD-10-CM | POA: Diagnosis not present

## 2022-04-19 DIAGNOSIS — R296 Repeated falls: Secondary | ICD-10-CM | POA: Diagnosis not present

## 2022-04-25 DIAGNOSIS — R296 Repeated falls: Secondary | ICD-10-CM | POA: Diagnosis not present

## 2022-04-25 DIAGNOSIS — R2681 Unsteadiness on feet: Secondary | ICD-10-CM | POA: Diagnosis not present

## 2022-04-25 DIAGNOSIS — U071 COVID-19: Secondary | ICD-10-CM | POA: Diagnosis not present

## 2022-04-25 DIAGNOSIS — M6281 Muscle weakness (generalized): Secondary | ICD-10-CM | POA: Diagnosis not present

## 2022-04-25 DIAGNOSIS — M6259 Muscle wasting and atrophy, not elsewhere classified, multiple sites: Secondary | ICD-10-CM | POA: Diagnosis not present

## 2022-04-25 DIAGNOSIS — R2689 Other abnormalities of gait and mobility: Secondary | ICD-10-CM | POA: Diagnosis not present

## 2022-04-26 DIAGNOSIS — U071 COVID-19: Secondary | ICD-10-CM | POA: Diagnosis not present

## 2022-04-26 DIAGNOSIS — M6281 Muscle weakness (generalized): Secondary | ICD-10-CM | POA: Diagnosis not present

## 2022-04-26 DIAGNOSIS — R2681 Unsteadiness on feet: Secondary | ICD-10-CM | POA: Diagnosis not present

## 2022-04-26 DIAGNOSIS — R296 Repeated falls: Secondary | ICD-10-CM | POA: Diagnosis not present

## 2022-04-26 DIAGNOSIS — M6259 Muscle wasting and atrophy, not elsewhere classified, multiple sites: Secondary | ICD-10-CM | POA: Diagnosis not present

## 2022-04-26 DIAGNOSIS — R2689 Other abnormalities of gait and mobility: Secondary | ICD-10-CM | POA: Diagnosis not present

## 2022-04-27 DIAGNOSIS — R2681 Unsteadiness on feet: Secondary | ICD-10-CM | POA: Diagnosis not present

## 2022-04-27 DIAGNOSIS — M6281 Muscle weakness (generalized): Secondary | ICD-10-CM | POA: Diagnosis not present

## 2022-04-27 DIAGNOSIS — R296 Repeated falls: Secondary | ICD-10-CM | POA: Diagnosis not present

## 2022-04-27 DIAGNOSIS — M6259 Muscle wasting and atrophy, not elsewhere classified, multiple sites: Secondary | ICD-10-CM | POA: Diagnosis not present

## 2022-04-27 DIAGNOSIS — U071 COVID-19: Secondary | ICD-10-CM | POA: Diagnosis not present

## 2022-04-27 DIAGNOSIS — R2689 Other abnormalities of gait and mobility: Secondary | ICD-10-CM | POA: Diagnosis not present

## 2022-05-03 DIAGNOSIS — R2689 Other abnormalities of gait and mobility: Secondary | ICD-10-CM | POA: Diagnosis not present

## 2022-05-03 DIAGNOSIS — R278 Other lack of coordination: Secondary | ICD-10-CM | POA: Diagnosis not present

## 2022-05-03 DIAGNOSIS — R296 Repeated falls: Secondary | ICD-10-CM | POA: Diagnosis not present

## 2022-05-03 DIAGNOSIS — M6259 Muscle wasting and atrophy, not elsewhere classified, multiple sites: Secondary | ICD-10-CM | POA: Diagnosis not present

## 2022-05-03 DIAGNOSIS — R2681 Unsteadiness on feet: Secondary | ICD-10-CM | POA: Diagnosis not present

## 2022-05-04 DIAGNOSIS — R296 Repeated falls: Secondary | ICD-10-CM | POA: Diagnosis not present

## 2022-05-04 DIAGNOSIS — R2689 Other abnormalities of gait and mobility: Secondary | ICD-10-CM | POA: Diagnosis not present

## 2022-05-04 DIAGNOSIS — R278 Other lack of coordination: Secondary | ICD-10-CM | POA: Diagnosis not present

## 2022-05-04 DIAGNOSIS — R2681 Unsteadiness on feet: Secondary | ICD-10-CM | POA: Diagnosis not present

## 2022-05-04 DIAGNOSIS — M6259 Muscle wasting and atrophy, not elsewhere classified, multiple sites: Secondary | ICD-10-CM | POA: Diagnosis not present

## 2022-05-08 DIAGNOSIS — R2681 Unsteadiness on feet: Secondary | ICD-10-CM | POA: Diagnosis not present

## 2022-05-08 DIAGNOSIS — R278 Other lack of coordination: Secondary | ICD-10-CM | POA: Diagnosis not present

## 2022-05-08 DIAGNOSIS — R296 Repeated falls: Secondary | ICD-10-CM | POA: Diagnosis not present

## 2022-05-08 DIAGNOSIS — M6259 Muscle wasting and atrophy, not elsewhere classified, multiple sites: Secondary | ICD-10-CM | POA: Diagnosis not present

## 2022-05-08 DIAGNOSIS — R2689 Other abnormalities of gait and mobility: Secondary | ICD-10-CM | POA: Diagnosis not present

## 2022-05-11 DIAGNOSIS — M6259 Muscle wasting and atrophy, not elsewhere classified, multiple sites: Secondary | ICD-10-CM | POA: Diagnosis not present

## 2022-05-11 DIAGNOSIS — R296 Repeated falls: Secondary | ICD-10-CM | POA: Diagnosis not present

## 2022-05-11 DIAGNOSIS — R278 Other lack of coordination: Secondary | ICD-10-CM | POA: Diagnosis not present

## 2022-05-11 DIAGNOSIS — R2689 Other abnormalities of gait and mobility: Secondary | ICD-10-CM | POA: Diagnosis not present

## 2022-05-11 DIAGNOSIS — R2681 Unsteadiness on feet: Secondary | ICD-10-CM | POA: Diagnosis not present

## 2022-05-14 DIAGNOSIS — R2681 Unsteadiness on feet: Secondary | ICD-10-CM | POA: Diagnosis not present

## 2022-05-14 DIAGNOSIS — M6259 Muscle wasting and atrophy, not elsewhere classified, multiple sites: Secondary | ICD-10-CM | POA: Diagnosis not present

## 2022-05-14 DIAGNOSIS — R278 Other lack of coordination: Secondary | ICD-10-CM | POA: Diagnosis not present

## 2022-05-14 DIAGNOSIS — R296 Repeated falls: Secondary | ICD-10-CM | POA: Diagnosis not present

## 2022-05-14 DIAGNOSIS — R2689 Other abnormalities of gait and mobility: Secondary | ICD-10-CM | POA: Diagnosis not present

## 2022-05-15 DIAGNOSIS — R2689 Other abnormalities of gait and mobility: Secondary | ICD-10-CM | POA: Diagnosis not present

## 2022-05-15 DIAGNOSIS — R296 Repeated falls: Secondary | ICD-10-CM | POA: Diagnosis not present

## 2022-05-15 DIAGNOSIS — R278 Other lack of coordination: Secondary | ICD-10-CM | POA: Diagnosis not present

## 2022-05-15 DIAGNOSIS — M6259 Muscle wasting and atrophy, not elsewhere classified, multiple sites: Secondary | ICD-10-CM | POA: Diagnosis not present

## 2022-05-15 DIAGNOSIS — R2681 Unsteadiness on feet: Secondary | ICD-10-CM | POA: Diagnosis not present

## 2022-05-21 DIAGNOSIS — R296 Repeated falls: Secondary | ICD-10-CM | POA: Diagnosis not present

## 2022-05-21 DIAGNOSIS — R2681 Unsteadiness on feet: Secondary | ICD-10-CM | POA: Diagnosis not present

## 2022-05-21 DIAGNOSIS — M6259 Muscle wasting and atrophy, not elsewhere classified, multiple sites: Secondary | ICD-10-CM | POA: Diagnosis not present

## 2022-05-21 DIAGNOSIS — R2689 Other abnormalities of gait and mobility: Secondary | ICD-10-CM | POA: Diagnosis not present

## 2022-05-21 DIAGNOSIS — R278 Other lack of coordination: Secondary | ICD-10-CM | POA: Diagnosis not present

## 2022-05-24 DIAGNOSIS — R296 Repeated falls: Secondary | ICD-10-CM | POA: Diagnosis not present

## 2022-05-24 DIAGNOSIS — R2681 Unsteadiness on feet: Secondary | ICD-10-CM | POA: Diagnosis not present

## 2022-05-24 DIAGNOSIS — R2689 Other abnormalities of gait and mobility: Secondary | ICD-10-CM | POA: Diagnosis not present

## 2022-05-24 DIAGNOSIS — R278 Other lack of coordination: Secondary | ICD-10-CM | POA: Diagnosis not present

## 2022-05-24 DIAGNOSIS — M6259 Muscle wasting and atrophy, not elsewhere classified, multiple sites: Secondary | ICD-10-CM | POA: Diagnosis not present

## 2022-05-30 DIAGNOSIS — R2689 Other abnormalities of gait and mobility: Secondary | ICD-10-CM | POA: Diagnosis not present

## 2022-05-30 DIAGNOSIS — R2681 Unsteadiness on feet: Secondary | ICD-10-CM | POA: Diagnosis not present

## 2022-05-30 DIAGNOSIS — R278 Other lack of coordination: Secondary | ICD-10-CM | POA: Diagnosis not present

## 2022-05-30 DIAGNOSIS — M6259 Muscle wasting and atrophy, not elsewhere classified, multiple sites: Secondary | ICD-10-CM | POA: Diagnosis not present

## 2022-05-30 DIAGNOSIS — R296 Repeated falls: Secondary | ICD-10-CM | POA: Diagnosis not present

## 2022-06-01 DIAGNOSIS — R278 Other lack of coordination: Secondary | ICD-10-CM | POA: Diagnosis not present

## 2022-06-01 DIAGNOSIS — R2689 Other abnormalities of gait and mobility: Secondary | ICD-10-CM | POA: Diagnosis not present

## 2022-06-01 DIAGNOSIS — R296 Repeated falls: Secondary | ICD-10-CM | POA: Diagnosis not present

## 2022-06-01 DIAGNOSIS — M6259 Muscle wasting and atrophy, not elsewhere classified, multiple sites: Secondary | ICD-10-CM | POA: Diagnosis not present

## 2022-06-01 DIAGNOSIS — R2681 Unsteadiness on feet: Secondary | ICD-10-CM | POA: Diagnosis not present

## 2022-06-06 DIAGNOSIS — I10 Essential (primary) hypertension: Secondary | ICD-10-CM | POA: Diagnosis not present

## 2022-06-06 DIAGNOSIS — G309 Alzheimer's disease, unspecified: Secondary | ICD-10-CM | POA: Diagnosis not present

## 2022-06-06 DIAGNOSIS — R739 Hyperglycemia, unspecified: Secondary | ICD-10-CM | POA: Diagnosis not present

## 2022-06-06 DIAGNOSIS — E039 Hypothyroidism, unspecified: Secondary | ICD-10-CM | POA: Diagnosis not present

## 2022-06-06 DIAGNOSIS — Z5181 Encounter for therapeutic drug level monitoring: Secondary | ICD-10-CM | POA: Diagnosis not present

## 2022-06-06 DIAGNOSIS — E7800 Pure hypercholesterolemia, unspecified: Secondary | ICD-10-CM | POA: Diagnosis not present

## 2022-06-06 DIAGNOSIS — Z8589 Personal history of malignant neoplasm of other organs and systems: Secondary | ICD-10-CM | POA: Diagnosis not present

## 2022-06-06 DIAGNOSIS — Z23 Encounter for immunization: Secondary | ICD-10-CM | POA: Diagnosis not present

## 2022-06-06 DIAGNOSIS — Z0000 Encounter for general adult medical examination without abnormal findings: Secondary | ICD-10-CM | POA: Diagnosis not present

## 2022-06-06 DIAGNOSIS — Z1331 Encounter for screening for depression: Secondary | ICD-10-CM | POA: Diagnosis not present

## 2022-06-06 DIAGNOSIS — R269 Unspecified abnormalities of gait and mobility: Secondary | ICD-10-CM | POA: Diagnosis not present

## 2022-06-08 DIAGNOSIS — R2681 Unsteadiness on feet: Secondary | ICD-10-CM | POA: Diagnosis not present

## 2022-06-08 DIAGNOSIS — R278 Other lack of coordination: Secondary | ICD-10-CM | POA: Diagnosis not present

## 2022-06-08 DIAGNOSIS — M6259 Muscle wasting and atrophy, not elsewhere classified, multiple sites: Secondary | ICD-10-CM | POA: Diagnosis not present

## 2022-06-08 DIAGNOSIS — R2689 Other abnormalities of gait and mobility: Secondary | ICD-10-CM | POA: Diagnosis not present

## 2022-06-08 DIAGNOSIS — R296 Repeated falls: Secondary | ICD-10-CM | POA: Diagnosis not present

## 2022-06-11 DIAGNOSIS — R296 Repeated falls: Secondary | ICD-10-CM | POA: Diagnosis not present

## 2022-06-11 DIAGNOSIS — R278 Other lack of coordination: Secondary | ICD-10-CM | POA: Diagnosis not present

## 2022-06-11 DIAGNOSIS — R2689 Other abnormalities of gait and mobility: Secondary | ICD-10-CM | POA: Diagnosis not present

## 2022-06-11 DIAGNOSIS — R2681 Unsteadiness on feet: Secondary | ICD-10-CM | POA: Diagnosis not present

## 2022-06-11 DIAGNOSIS — M6259 Muscle wasting and atrophy, not elsewhere classified, multiple sites: Secondary | ICD-10-CM | POA: Diagnosis not present

## 2022-06-12 DIAGNOSIS — R296 Repeated falls: Secondary | ICD-10-CM | POA: Diagnosis not present

## 2022-06-12 DIAGNOSIS — R278 Other lack of coordination: Secondary | ICD-10-CM | POA: Diagnosis not present

## 2022-06-12 DIAGNOSIS — M6259 Muscle wasting and atrophy, not elsewhere classified, multiple sites: Secondary | ICD-10-CM | POA: Diagnosis not present

## 2022-06-12 DIAGNOSIS — R2689 Other abnormalities of gait and mobility: Secondary | ICD-10-CM | POA: Diagnosis not present

## 2022-06-12 DIAGNOSIS — R2681 Unsteadiness on feet: Secondary | ICD-10-CM | POA: Diagnosis not present

## 2022-06-14 DIAGNOSIS — R2681 Unsteadiness on feet: Secondary | ICD-10-CM | POA: Diagnosis not present

## 2022-06-14 DIAGNOSIS — M6259 Muscle wasting and atrophy, not elsewhere classified, multiple sites: Secondary | ICD-10-CM | POA: Diagnosis not present

## 2022-06-14 DIAGNOSIS — R296 Repeated falls: Secondary | ICD-10-CM | POA: Diagnosis not present

## 2022-06-14 DIAGNOSIS — R278 Other lack of coordination: Secondary | ICD-10-CM | POA: Diagnosis not present

## 2022-06-14 DIAGNOSIS — R2689 Other abnormalities of gait and mobility: Secondary | ICD-10-CM | POA: Diagnosis not present

## 2022-06-19 DIAGNOSIS — R7989 Other specified abnormal findings of blood chemistry: Secondary | ICD-10-CM | POA: Diagnosis not present

## 2022-06-19 DIAGNOSIS — R946 Abnormal results of thyroid function studies: Secondary | ICD-10-CM | POA: Diagnosis not present

## 2022-06-19 DIAGNOSIS — R944 Abnormal results of kidney function studies: Secondary | ICD-10-CM | POA: Diagnosis not present

## 2022-06-20 DIAGNOSIS — R296 Repeated falls: Secondary | ICD-10-CM | POA: Diagnosis not present

## 2022-06-20 DIAGNOSIS — M6259 Muscle wasting and atrophy, not elsewhere classified, multiple sites: Secondary | ICD-10-CM | POA: Diagnosis not present

## 2022-06-20 DIAGNOSIS — R2681 Unsteadiness on feet: Secondary | ICD-10-CM | POA: Diagnosis not present

## 2022-06-20 DIAGNOSIS — R278 Other lack of coordination: Secondary | ICD-10-CM | POA: Diagnosis not present

## 2022-06-20 DIAGNOSIS — R2689 Other abnormalities of gait and mobility: Secondary | ICD-10-CM | POA: Diagnosis not present

## 2022-06-22 DIAGNOSIS — R296 Repeated falls: Secondary | ICD-10-CM | POA: Diagnosis not present

## 2022-06-22 DIAGNOSIS — M6259 Muscle wasting and atrophy, not elsewhere classified, multiple sites: Secondary | ICD-10-CM | POA: Diagnosis not present

## 2022-06-22 DIAGNOSIS — R2681 Unsteadiness on feet: Secondary | ICD-10-CM | POA: Diagnosis not present

## 2022-06-22 DIAGNOSIS — R2689 Other abnormalities of gait and mobility: Secondary | ICD-10-CM | POA: Diagnosis not present

## 2022-06-22 DIAGNOSIS — R278 Other lack of coordination: Secondary | ICD-10-CM | POA: Diagnosis not present

## 2022-06-27 DIAGNOSIS — R296 Repeated falls: Secondary | ICD-10-CM | POA: Diagnosis not present

## 2022-06-27 DIAGNOSIS — M6259 Muscle wasting and atrophy, not elsewhere classified, multiple sites: Secondary | ICD-10-CM | POA: Diagnosis not present

## 2022-06-27 DIAGNOSIS — R2689 Other abnormalities of gait and mobility: Secondary | ICD-10-CM | POA: Diagnosis not present

## 2022-06-27 DIAGNOSIS — R2681 Unsteadiness on feet: Secondary | ICD-10-CM | POA: Diagnosis not present

## 2022-06-27 DIAGNOSIS — R278 Other lack of coordination: Secondary | ICD-10-CM | POA: Diagnosis not present

## 2022-06-29 DIAGNOSIS — M6259 Muscle wasting and atrophy, not elsewhere classified, multiple sites: Secondary | ICD-10-CM | POA: Diagnosis not present

## 2022-06-29 DIAGNOSIS — R2689 Other abnormalities of gait and mobility: Secondary | ICD-10-CM | POA: Diagnosis not present

## 2022-06-29 DIAGNOSIS — R2681 Unsteadiness on feet: Secondary | ICD-10-CM | POA: Diagnosis not present

## 2022-06-29 DIAGNOSIS — R278 Other lack of coordination: Secondary | ICD-10-CM | POA: Diagnosis not present

## 2022-06-29 DIAGNOSIS — R296 Repeated falls: Secondary | ICD-10-CM | POA: Diagnosis not present

## 2022-07-02 DIAGNOSIS — R2689 Other abnormalities of gait and mobility: Secondary | ICD-10-CM | POA: Diagnosis not present

## 2022-07-02 DIAGNOSIS — R278 Other lack of coordination: Secondary | ICD-10-CM | POA: Diagnosis not present

## 2022-07-02 DIAGNOSIS — M6259 Muscle wasting and atrophy, not elsewhere classified, multiple sites: Secondary | ICD-10-CM | POA: Diagnosis not present

## 2022-07-03 DIAGNOSIS — R278 Other lack of coordination: Secondary | ICD-10-CM | POA: Diagnosis not present

## 2022-07-03 DIAGNOSIS — M6259 Muscle wasting and atrophy, not elsewhere classified, multiple sites: Secondary | ICD-10-CM | POA: Diagnosis not present

## 2022-07-03 DIAGNOSIS — R2689 Other abnormalities of gait and mobility: Secondary | ICD-10-CM | POA: Diagnosis not present

## 2022-07-10 DIAGNOSIS — M6259 Muscle wasting and atrophy, not elsewhere classified, multiple sites: Secondary | ICD-10-CM | POA: Diagnosis not present

## 2022-07-10 DIAGNOSIS — R278 Other lack of coordination: Secondary | ICD-10-CM | POA: Diagnosis not present

## 2022-07-10 DIAGNOSIS — R2689 Other abnormalities of gait and mobility: Secondary | ICD-10-CM | POA: Diagnosis not present

## 2022-07-12 DIAGNOSIS — R278 Other lack of coordination: Secondary | ICD-10-CM | POA: Diagnosis not present

## 2022-07-12 DIAGNOSIS — R2689 Other abnormalities of gait and mobility: Secondary | ICD-10-CM | POA: Diagnosis not present

## 2022-07-12 DIAGNOSIS — M6259 Muscle wasting and atrophy, not elsewhere classified, multiple sites: Secondary | ICD-10-CM | POA: Diagnosis not present

## 2022-07-18 DIAGNOSIS — R2689 Other abnormalities of gait and mobility: Secondary | ICD-10-CM | POA: Diagnosis not present

## 2022-07-18 DIAGNOSIS — M6259 Muscle wasting and atrophy, not elsewhere classified, multiple sites: Secondary | ICD-10-CM | POA: Diagnosis not present

## 2022-07-18 DIAGNOSIS — R278 Other lack of coordination: Secondary | ICD-10-CM | POA: Diagnosis not present

## 2022-07-19 DIAGNOSIS — M6259 Muscle wasting and atrophy, not elsewhere classified, multiple sites: Secondary | ICD-10-CM | POA: Diagnosis not present

## 2022-07-19 DIAGNOSIS — R2689 Other abnormalities of gait and mobility: Secondary | ICD-10-CM | POA: Diagnosis not present

## 2022-07-19 DIAGNOSIS — R278 Other lack of coordination: Secondary | ICD-10-CM | POA: Diagnosis not present

## 2022-09-07 ENCOUNTER — Telehealth: Payer: Self-pay | Admitting: Neurology

## 2022-09-07 NOTE — Telephone Encounter (Signed)
Letter completed.
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
## 2022-09-10 NOTE — Telephone Encounter (Signed)
Letter has been mailed.

## 2022-09-27 DIAGNOSIS — Z23 Encounter for immunization: Secondary | ICD-10-CM | POA: Diagnosis not present

## 2022-10-10 DIAGNOSIS — Z23 Encounter for immunization: Secondary | ICD-10-CM | POA: Diagnosis not present

## 2022-11-06 ENCOUNTER — Ambulatory Visit: Payer: Medicare Other | Admitting: Neurology

## 2022-11-19 DIAGNOSIS — F411 Generalized anxiety disorder: Secondary | ICD-10-CM | POA: Diagnosis not present

## 2022-11-19 DIAGNOSIS — F0390 Unspecified dementia without behavioral disturbance: Secondary | ICD-10-CM | POA: Diagnosis not present

## 2022-11-19 DIAGNOSIS — F909 Attention-deficit hyperactivity disorder, unspecified type: Secondary | ICD-10-CM | POA: Diagnosis not present

## 2022-11-19 DIAGNOSIS — I10 Essential (primary) hypertension: Secondary | ICD-10-CM | POA: Diagnosis not present

## 2022-11-19 DIAGNOSIS — N400 Enlarged prostate without lower urinary tract symptoms: Secondary | ICD-10-CM | POA: Diagnosis not present

## 2022-11-19 DIAGNOSIS — E039 Hypothyroidism, unspecified: Secondary | ICD-10-CM | POA: Diagnosis not present

## 2022-11-19 DIAGNOSIS — E7800 Pure hypercholesterolemia, unspecified: Secondary | ICD-10-CM | POA: Diagnosis not present

## 2022-11-30 DIAGNOSIS — E785 Hyperlipidemia, unspecified: Secondary | ICD-10-CM | POA: Diagnosis not present

## 2022-11-30 DIAGNOSIS — Z1329 Encounter for screening for other suspected endocrine disorder: Secondary | ICD-10-CM | POA: Diagnosis not present

## 2022-11-30 DIAGNOSIS — I10 Essential (primary) hypertension: Secondary | ICD-10-CM | POA: Diagnosis not present

## 2022-12-10 DIAGNOSIS — R451 Restlessness and agitation: Secondary | ICD-10-CM | POA: Diagnosis not present

## 2022-12-10 DIAGNOSIS — F0390 Unspecified dementia without behavioral disturbance: Secondary | ICD-10-CM | POA: Diagnosis not present

## 2022-12-10 DIAGNOSIS — N400 Enlarged prostate without lower urinary tract symptoms: Secondary | ICD-10-CM | POA: Diagnosis not present

## 2022-12-13 DIAGNOSIS — N390 Urinary tract infection, site not specified: Secondary | ICD-10-CM | POA: Diagnosis not present

## 2022-12-17 DIAGNOSIS — R399 Unspecified symptoms and signs involving the genitourinary system: Secondary | ICD-10-CM | POA: Diagnosis not present

## 2022-12-17 DIAGNOSIS — F0390 Unspecified dementia without behavioral disturbance: Secondary | ICD-10-CM | POA: Diagnosis not present

## 2022-12-17 DIAGNOSIS — R451 Restlessness and agitation: Secondary | ICD-10-CM | POA: Diagnosis not present

## 2022-12-24 ENCOUNTER — Ambulatory Visit: Payer: Medicare Other | Admitting: Neurology

## 2022-12-28 DIAGNOSIS — F909 Attention-deficit hyperactivity disorder, unspecified type: Secondary | ICD-10-CM | POA: Diagnosis not present

## 2022-12-28 DIAGNOSIS — R451 Restlessness and agitation: Secondary | ICD-10-CM | POA: Diagnosis not present

## 2022-12-28 DIAGNOSIS — I10 Essential (primary) hypertension: Secondary | ICD-10-CM | POA: Diagnosis not present

## 2022-12-28 DIAGNOSIS — F411 Generalized anxiety disorder: Secondary | ICD-10-CM | POA: Diagnosis not present

## 2022-12-28 DIAGNOSIS — N400 Enlarged prostate without lower urinary tract symptoms: Secondary | ICD-10-CM | POA: Diagnosis not present

## 2022-12-28 DIAGNOSIS — F0390 Unspecified dementia without behavioral disturbance: Secondary | ICD-10-CM | POA: Diagnosis not present

## 2023-01-01 DIAGNOSIS — G309 Alzheimer's disease, unspecified: Secondary | ICD-10-CM | POA: Diagnosis not present

## 2023-01-01 DIAGNOSIS — F909 Attention-deficit hyperactivity disorder, unspecified type: Secondary | ICD-10-CM | POA: Diagnosis not present

## 2023-01-09 DIAGNOSIS — E782 Mixed hyperlipidemia: Secondary | ICD-10-CM | POA: Diagnosis not present

## 2023-01-09 DIAGNOSIS — Z7902 Long term (current) use of antithrombotics/antiplatelets: Secondary | ICD-10-CM | POA: Diagnosis not present

## 2023-01-09 DIAGNOSIS — F909 Attention-deficit hyperactivity disorder, unspecified type: Secondary | ICD-10-CM | POA: Diagnosis not present

## 2023-01-09 DIAGNOSIS — N401 Enlarged prostate with lower urinary tract symptoms: Secondary | ICD-10-CM | POA: Diagnosis not present

## 2023-01-09 DIAGNOSIS — F02818 Dementia in other diseases classified elsewhere, unspecified severity, with other behavioral disturbance: Secondary | ICD-10-CM | POA: Diagnosis not present

## 2023-01-09 DIAGNOSIS — Z993 Dependence on wheelchair: Secondary | ICD-10-CM | POA: Diagnosis not present

## 2023-01-09 DIAGNOSIS — I10 Essential (primary) hypertension: Secondary | ICD-10-CM | POA: Diagnosis not present

## 2023-01-09 DIAGNOSIS — G301 Alzheimer's disease with late onset: Secondary | ICD-10-CM | POA: Diagnosis not present

## 2023-01-09 DIAGNOSIS — E038 Other specified hypothyroidism: Secondary | ICD-10-CM | POA: Diagnosis not present

## 2023-01-25 DIAGNOSIS — F411 Generalized anxiety disorder: Secondary | ICD-10-CM | POA: Diagnosis not present

## 2023-01-25 DIAGNOSIS — F909 Attention-deficit hyperactivity disorder, unspecified type: Secondary | ICD-10-CM | POA: Diagnosis not present

## 2023-01-25 DIAGNOSIS — G309 Alzheimer's disease, unspecified: Secondary | ICD-10-CM | POA: Diagnosis not present

## 2023-01-25 DIAGNOSIS — R451 Restlessness and agitation: Secondary | ICD-10-CM | POA: Diagnosis not present

## 2023-01-28 DIAGNOSIS — F02818 Dementia in other diseases classified elsewhere, unspecified severity, with other behavioral disturbance: Secondary | ICD-10-CM | POA: Diagnosis not present

## 2023-01-28 DIAGNOSIS — I10 Essential (primary) hypertension: Secondary | ICD-10-CM | POA: Diagnosis not present

## 2023-01-28 DIAGNOSIS — G301 Alzheimer's disease with late onset: Secondary | ICD-10-CM | POA: Diagnosis not present

## 2023-01-28 DIAGNOSIS — Z7902 Long term (current) use of antithrombotics/antiplatelets: Secondary | ICD-10-CM | POA: Diagnosis not present

## 2023-01-28 DIAGNOSIS — E782 Mixed hyperlipidemia: Secondary | ICD-10-CM | POA: Diagnosis not present

## 2023-01-30 DIAGNOSIS — E785 Hyperlipidemia, unspecified: Secondary | ICD-10-CM | POA: Diagnosis not present

## 2023-01-30 DIAGNOSIS — I10 Essential (primary) hypertension: Secondary | ICD-10-CM | POA: Diagnosis not present

## 2023-02-19 DIAGNOSIS — F02818 Dementia in other diseases classified elsewhere, unspecified severity, with other behavioral disturbance: Secondary | ICD-10-CM | POA: Diagnosis not present

## 2023-02-19 DIAGNOSIS — E782 Mixed hyperlipidemia: Secondary | ICD-10-CM | POA: Diagnosis not present

## 2023-02-19 DIAGNOSIS — I10 Essential (primary) hypertension: Secondary | ICD-10-CM | POA: Diagnosis not present

## 2023-02-19 DIAGNOSIS — G301 Alzheimer's disease with late onset: Secondary | ICD-10-CM | POA: Diagnosis not present

## 2023-02-21 DIAGNOSIS — E785 Hyperlipidemia, unspecified: Secondary | ICD-10-CM | POA: Diagnosis not present

## 2023-02-21 DIAGNOSIS — I10 Essential (primary) hypertension: Secondary | ICD-10-CM | POA: Diagnosis not present

## 2023-03-18 DIAGNOSIS — F02818 Dementia in other diseases classified elsewhere, unspecified severity, with other behavioral disturbance: Secondary | ICD-10-CM | POA: Diagnosis not present

## 2023-03-18 DIAGNOSIS — E038 Other specified hypothyroidism: Secondary | ICD-10-CM | POA: Diagnosis not present

## 2023-03-18 DIAGNOSIS — N400 Enlarged prostate without lower urinary tract symptoms: Secondary | ICD-10-CM | POA: Diagnosis not present

## 2023-03-18 DIAGNOSIS — G301 Alzheimer's disease with late onset: Secondary | ICD-10-CM | POA: Diagnosis not present

## 2023-03-22 DIAGNOSIS — F411 Generalized anxiety disorder: Secondary | ICD-10-CM | POA: Diagnosis not present

## 2023-03-22 DIAGNOSIS — G309 Alzheimer's disease, unspecified: Secondary | ICD-10-CM | POA: Diagnosis not present

## 2023-03-22 DIAGNOSIS — R451 Restlessness and agitation: Secondary | ICD-10-CM | POA: Diagnosis not present

## 2023-03-22 DIAGNOSIS — F909 Attention-deficit hyperactivity disorder, unspecified type: Secondary | ICD-10-CM | POA: Diagnosis not present

## 2023-04-15 DIAGNOSIS — G301 Alzheimer's disease with late onset: Secondary | ICD-10-CM | POA: Diagnosis not present

## 2023-04-15 DIAGNOSIS — I10 Essential (primary) hypertension: Secondary | ICD-10-CM | POA: Diagnosis not present

## 2023-04-15 DIAGNOSIS — E039 Hypothyroidism, unspecified: Secondary | ICD-10-CM | POA: Diagnosis not present

## 2023-04-15 DIAGNOSIS — F02818 Dementia in other diseases classified elsewhere, unspecified severity, with other behavioral disturbance: Secondary | ICD-10-CM | POA: Diagnosis not present

## 2023-04-16 DIAGNOSIS — F411 Generalized anxiety disorder: Secondary | ICD-10-CM | POA: Diagnosis not present

## 2023-04-16 DIAGNOSIS — I10 Essential (primary) hypertension: Secondary | ICD-10-CM | POA: Diagnosis not present

## 2023-05-16 DIAGNOSIS — F909 Attention-deficit hyperactivity disorder, unspecified type: Secondary | ICD-10-CM | POA: Diagnosis not present

## 2023-05-16 DIAGNOSIS — F411 Generalized anxiety disorder: Secondary | ICD-10-CM | POA: Diagnosis not present

## 2023-05-16 DIAGNOSIS — G309 Alzheimer's disease, unspecified: Secondary | ICD-10-CM | POA: Diagnosis not present

## 2023-05-16 DIAGNOSIS — R451 Restlessness and agitation: Secondary | ICD-10-CM | POA: Diagnosis not present

## 2023-05-20 ENCOUNTER — Ambulatory Visit: Payer: BLUE CROSS/BLUE SHIELD | Admitting: Neurology

## 2023-05-20 DIAGNOSIS — E039 Hypothyroidism, unspecified: Secondary | ICD-10-CM | POA: Diagnosis not present

## 2023-05-20 DIAGNOSIS — F02818 Dementia in other diseases classified elsewhere, unspecified severity, with other behavioral disturbance: Secondary | ICD-10-CM | POA: Diagnosis not present

## 2023-05-20 DIAGNOSIS — G301 Alzheimer's disease with late onset: Secondary | ICD-10-CM | POA: Diagnosis not present

## 2023-05-20 DIAGNOSIS — N400 Enlarged prostate without lower urinary tract symptoms: Secondary | ICD-10-CM | POA: Diagnosis not present

## 2023-05-22 DIAGNOSIS — I10 Essential (primary) hypertension: Secondary | ICD-10-CM | POA: Diagnosis not present

## 2023-05-22 DIAGNOSIS — G301 Alzheimer's disease with late onset: Secondary | ICD-10-CM | POA: Diagnosis not present

## 2023-05-22 DIAGNOSIS — N401 Enlarged prostate with lower urinary tract symptoms: Secondary | ICD-10-CM | POA: Diagnosis not present

## 2023-05-22 DIAGNOSIS — E038 Other specified hypothyroidism: Secondary | ICD-10-CM | POA: Diagnosis not present

## 2023-05-22 DIAGNOSIS — F0280 Dementia in other diseases classified elsewhere without behavioral disturbance: Secondary | ICD-10-CM | POA: Diagnosis not present

## 2023-05-23 DIAGNOSIS — E039 Hypothyroidism, unspecified: Secondary | ICD-10-CM | POA: Diagnosis not present

## 2023-05-23 DIAGNOSIS — I10 Essential (primary) hypertension: Secondary | ICD-10-CM | POA: Diagnosis not present

## 2023-05-27 DIAGNOSIS — F0390 Unspecified dementia without behavioral disturbance: Secondary | ICD-10-CM | POA: Diagnosis not present

## 2023-05-27 DIAGNOSIS — E039 Hypothyroidism, unspecified: Secondary | ICD-10-CM | POA: Diagnosis not present

## 2023-06-14 ENCOUNTER — Emergency Department (HOSPITAL_COMMUNITY)
Admission: EM | Admit: 2023-06-14 | Discharge: 2023-06-15 | Disposition: A | Discharge status: Skilled Nursing Facility | Attending: Emergency Medicine | Admitting: Emergency Medicine

## 2023-06-14 ENCOUNTER — Other Ambulatory Visit: Payer: Self-pay

## 2023-06-14 ENCOUNTER — Encounter (HOSPITAL_COMMUNITY): Payer: Self-pay

## 2023-06-14 ENCOUNTER — Emergency Department (HOSPITAL_COMMUNITY)

## 2023-06-14 DIAGNOSIS — F0390 Unspecified dementia without behavioral disturbance: Secondary | ICD-10-CM

## 2023-06-14 DIAGNOSIS — F03918 Unspecified dementia, unspecified severity, with other behavioral disturbance: Secondary | ICD-10-CM | POA: Diagnosis present

## 2023-06-14 DIAGNOSIS — R911 Solitary pulmonary nodule: Secondary | ICD-10-CM

## 2023-06-14 DIAGNOSIS — I10 Essential (primary) hypertension: Secondary | ICD-10-CM | POA: Insufficient documentation

## 2023-06-14 DIAGNOSIS — Z8673 Personal history of transient ischemic attack (TIA), and cerebral infarction without residual deficits: Secondary | ICD-10-CM | POA: Insufficient documentation

## 2023-06-14 DIAGNOSIS — F02818 Dementia in other diseases classified elsewhere, unspecified severity, with other behavioral disturbance: Secondary | ICD-10-CM | POA: Diagnosis not present

## 2023-06-14 DIAGNOSIS — E039 Hypothyroidism, unspecified: Secondary | ICD-10-CM | POA: Insufficient documentation

## 2023-06-14 DIAGNOSIS — G309 Alzheimer's disease, unspecified: Secondary | ICD-10-CM | POA: Insufficient documentation

## 2023-06-14 DIAGNOSIS — Z87891 Personal history of nicotine dependence: Secondary | ICD-10-CM | POA: Insufficient documentation

## 2023-06-14 DIAGNOSIS — R4182 Altered mental status, unspecified: Secondary | ICD-10-CM | POA: Diagnosis not present

## 2023-06-14 DIAGNOSIS — Z85810 Personal history of malignant neoplasm of tongue: Secondary | ICD-10-CM | POA: Insufficient documentation

## 2023-06-14 DIAGNOSIS — R451 Restlessness and agitation: Secondary | ICD-10-CM | POA: Diagnosis not present

## 2023-06-14 DIAGNOSIS — Y900 Blood alcohol level of less than 20 mg/100 ml: Secondary | ICD-10-CM | POA: Insufficient documentation

## 2023-06-14 DIAGNOSIS — R918 Other nonspecific abnormal finding of lung field: Secondary | ICD-10-CM | POA: Diagnosis not present

## 2023-06-14 DIAGNOSIS — F02C11 Dementia in other diseases classified elsewhere, severe, with agitation: Secondary | ICD-10-CM | POA: Diagnosis not present

## 2023-06-14 DIAGNOSIS — F909 Attention-deficit hyperactivity disorder, unspecified type: Secondary | ICD-10-CM | POA: Diagnosis not present

## 2023-06-14 DIAGNOSIS — Z79899 Other long term (current) drug therapy: Secondary | ICD-10-CM | POA: Diagnosis not present

## 2023-06-14 DIAGNOSIS — F411 Generalized anxiety disorder: Secondary | ICD-10-CM | POA: Diagnosis not present

## 2023-06-14 HISTORY — DX: Other psychoactive substance abuse, uncomplicated: F19.10

## 2023-06-14 HISTORY — DX: Anxiety disorder, unspecified: F41.9

## 2023-06-14 HISTORY — DX: Unspecified dementia, unspecified severity, without behavioral disturbance, psychotic disturbance, mood disturbance, and anxiety: F03.90

## 2023-06-14 HISTORY — DX: Essential (primary) hypertension: I10

## 2023-06-14 LAB — URINALYSIS, ROUTINE W REFLEX MICROSCOPIC
Bilirubin Urine: NEGATIVE
Glucose, UA: NEGATIVE mg/dL
Hgb urine dipstick: NEGATIVE
Ketones, ur: NEGATIVE mg/dL
Leukocytes,Ua: NEGATIVE
Nitrite: NEGATIVE
Protein, ur: NEGATIVE mg/dL
Specific Gravity, Urine: 1.021 (ref 1.005–1.030)
pH: 5 (ref 5.0–8.0)

## 2023-06-14 LAB — COMPREHENSIVE METABOLIC PANEL WITH GFR
ALT: 20 U/L (ref 0–44)
AST: 21 U/L (ref 15–41)
Albumin: 3.8 g/dL (ref 3.5–5.0)
Alkaline Phosphatase: 106 U/L (ref 38–126)
Anion gap: 10 (ref 5–15)
BUN: 20 mg/dL (ref 8–23)
CO2: 24 mmol/L (ref 22–32)
Calcium: 9.9 mg/dL (ref 8.9–10.3)
Chloride: 107 mmol/L (ref 98–111)
Creatinine, Ser: 1.36 mg/dL — ABNORMAL HIGH (ref 0.61–1.24)
GFR, Estimated: 52 mL/min — ABNORMAL LOW (ref >60)
Glucose, Bld: 126 mg/dL — ABNORMAL HIGH (ref 70–99)
Potassium: 4.2 mmol/L (ref 3.5–5.1)
Sodium: 141 mmol/L (ref 135–145)
Total Bilirubin: 1.2 mg/dL (ref 0.0–1.2)
Total Protein: 6.8 g/dL (ref 6.5–8.1)

## 2023-06-14 LAB — CBC WITH DIFFERENTIAL/PLATELET
Abs Immature Granulocytes: 0.04 10*3/uL (ref 0.00–0.07)
Basophils Absolute: 0 10*3/uL (ref 0.0–0.1)
Basophils Relative: 1 %
Eosinophils Absolute: 0.1 10*3/uL (ref 0.0–0.5)
Eosinophils Relative: 2 %
HCT: 43.9 % (ref 39.0–52.0)
Hemoglobin: 14.3 g/dL (ref 13.0–17.0)
Immature Granulocytes: 1 %
Lymphocytes Relative: 12 %
Lymphs Abs: 0.8 10*3/uL (ref 0.7–4.0)
MCH: 29.4 pg (ref 26.0–34.0)
MCHC: 32.6 g/dL (ref 30.0–36.0)
MCV: 90.3 fL (ref 80.0–100.0)
Monocytes Absolute: 0.6 10*3/uL (ref 0.1–1.0)
Monocytes Relative: 9 %
Neutro Abs: 5.2 10*3/uL (ref 1.7–7.7)
Neutrophils Relative %: 75 %
Platelets: 227 10*3/uL (ref 150–400)
RBC: 4.86 MIL/uL (ref 4.22–5.81)
RDW: 13.1 % (ref 11.5–15.5)
WBC: 6.8 10*3/uL (ref 4.0–10.5)
nRBC: 0 % (ref 0.0–0.2)

## 2023-06-14 LAB — RAPID URINE DRUG SCREEN, HOSP PERFORMED
Amphetamines: NOT DETECTED
Barbiturates: NOT DETECTED
Benzodiazepines: POSITIVE — AB
Cocaine: NOT DETECTED
Opiates: NOT DETECTED
Tetrahydrocannabinol: NOT DETECTED

## 2023-06-14 LAB — ETHANOL: Alcohol, Ethyl (B): <15 mg/dL (ref <15)

## 2023-06-14 MED ORDER — FOLIC ACID 1 MG PO TABS
1.0000 mg | ORAL_TABLET | Freq: Every day | ORAL | Status: DC
  Administered 2023-06-15: 1 mg via ORAL
  Filled 2023-06-14 (×2): qty 1

## 2023-06-14 MED ORDER — THIAMINE MONONITRATE 100 MG PO TABS
100.0000 mg | ORAL_TABLET | Freq: Every day | ORAL | Status: DC
  Administered 2023-06-15: 100 mg via ORAL
  Filled 2023-06-14 (×2): qty 1

## 2023-06-14 MED ORDER — LORAZEPAM 1 MG PO TABS
1.0000 mg | ORAL_TABLET | ORAL | Status: DC | PRN
  Filled 2023-06-14: qty 2

## 2023-06-14 MED ORDER — THIAMINE HCL 100 MG/ML IJ SOLN: 100.0000 mg | Freq: Every day | INTRAMUSCULAR | Status: DC

## 2023-06-14 MED ORDER — ADULT MULTIVITAMIN W/MINERALS CH
1.0000 | ORAL_TABLET | Freq: Every day | ORAL | Status: DC
  Administered 2023-06-15: 1 via ORAL
  Filled 2023-06-14 (×2): qty 1

## 2023-06-14 MED ORDER — HALOPERIDOL LACTATE 5 MG/ML IJ SOLN
5.0000 mg | Freq: Four times a day (QID) | INTRAMUSCULAR | Status: DC | PRN
  Administered 2023-06-14: 5 mg via INTRAMUSCULAR
  Filled 2023-06-14: qty 1
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
## 2023-06-14 NOTE — ED Notes (Signed)
 Patient trying to get out bed, threatening to leave.  IM orders received

## 2023-06-14 NOTE — ED Notes (Signed)
 Pt's clothing inspected by this RN and placed in locker #2.

## 2023-06-14 NOTE — ED Notes (Signed)
 IVC'd 06/14/23, exp 06/21/23; IVC docs in orange zone. All copies, fax, and filings completed. No Case number due to timing near weekend after hours.  Env #[id number]

## 2023-06-15 ENCOUNTER — Other Ambulatory Visit (HOSPITAL_COMMUNITY): Payer: Self-pay

## 2023-06-15 ENCOUNTER — Encounter (HOSPITAL_COMMUNITY): Payer: Self-pay | Admitting: Psychiatry

## 2023-06-15 DIAGNOSIS — F03918 Unspecified dementia, unspecified severity, with other behavioral disturbance: Secondary | ICD-10-CM | POA: Diagnosis present

## 2023-06-15 DIAGNOSIS — G309 Alzheimer's disease, unspecified: Secondary | ICD-10-CM | POA: Diagnosis not present

## 2023-06-15 DIAGNOSIS — R404 Transient alteration of awareness: Secondary | ICD-10-CM | POA: Diagnosis not present

## 2023-06-15 DIAGNOSIS — Z743 Need for continuous supervision: Secondary | ICD-10-CM | POA: Diagnosis not present

## 2023-06-15 MED ORDER — QUETIAPINE FUMARATE 25 MG PO TABS
25.0000 mg | ORAL_TABLET | Freq: Two times a day (BID) | ORAL | 0 refills | Status: AC
  Filled 2023-06-15: qty 60, 30d supply, fill #0

## 2023-06-15 MED ORDER — LORAZEPAM 0.5 MG PO TABS
0.5000 mg | ORAL_TABLET | Freq: Every day | ORAL | Status: DC
  Administered 2023-06-15: 0.5 mg via ORAL
  Filled 2023-06-15: qty 1

## 2023-06-15 MED ORDER — ATORVASTATIN CALCIUM 40 MG PO TABS
40.0000 mg | ORAL_TABLET | Freq: Every day | ORAL | Status: DC
  Administered 2023-06-15: 40 mg via ORAL
  Filled 2023-06-15: qty 1

## 2023-06-15 MED ORDER — LEVOTHYROXINE SODIUM 100 MCG PO TABS: 100.0000 ug | ORAL_TABLET | Freq: Every day | ORAL | Status: DC

## 2023-06-15 MED ORDER — DONEPEZIL HCL 5 MG PO TABS
10.0000 mg | ORAL_TABLET | Freq: Every day | ORAL | Status: DC
  Administered 2023-06-15: 10 mg via ORAL
  Filled 2023-06-15: qty 2

## 2023-06-15 MED ORDER — CLOPIDOGREL BISULFATE 75 MG PO TABS
75.0000 mg | ORAL_TABLET | Freq: Every day | ORAL | Status: DC
  Administered 2023-06-15: 75 mg via ORAL
  Filled 2023-06-15: qty 1

## 2023-06-15 MED ORDER — ACETAMINOPHEN 500 MG PO TABS: 1000.0000 mg | ORAL_TABLET | Freq: Four times a day (QID) | ORAL | Status: DC | PRN

## 2023-06-15 MED ORDER — QUETIAPINE FUMARATE 25 MG PO TABS
25.0000 mg | ORAL_TABLET | Freq: Two times a day (BID) | ORAL | Status: DC
  Administered 2023-06-15 (×2): 25 mg via ORAL
  Filled 2023-06-15 (×2): qty 1
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
## 2023-06-15 NOTE — Discharge Instructions (Signed)
 We have started Seroquel  25 mg twice daily.  Please take this as prescribed and follow closely with your primary care doctor.
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
## 2023-06-15 NOTE — Progress Notes (Signed)
 CSW contacted [facility] about patient returning to their facility when medically cleared. CSW spoke with Ellouise at the front desk who stated that the majority of administration does not work on the weekends. Ellouise told CSW they have a Production designer, theatre/television/film on duty but that's it. Ellouise stated her supervisor advised CSW to call the executive director Will [place], [phone]. CSW left a message requesting a call back. CSW also contacted [place] with legacy who can also assist with discharge planning. CSW left a message, but is unsure if Angeline works weekends

## 2023-06-15 NOTE — ED Notes (Signed)
 Attempted to call report to [facility], no answer at this time.  SW has spoken with Librarian, academic for [employer] who is aware of pt return.

## 2023-06-15 NOTE — BH Assessment (Signed)
 TTS Clinician attempted to complete TTS assessment.  0230 Adrienne, RN, patient asleep, prior was agitated, unable to arouse.

## 2023-06-15 NOTE — ED Notes (Signed)
 [facility] was called no ETA

## 2023-06-15 NOTE — ED Notes (Signed)
IVC rescinded 
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
## 2023-06-15 NOTE — ED Notes (Signed)
 IVC IN PURPLE ZONE CURRENT

## 2023-06-15 NOTE — ED Notes (Signed)
 Safety sitter left at 0300 as scheduled. Per staffing, hospital is unable to provide any safety sitters for this pt at this time. Pt does not have safety attendant.

## 2023-06-17 ENCOUNTER — Encounter (HOSPITAL_COMMUNITY): Payer: Self-pay | Admitting: Internal Medicine

## 2023-06-17 DIAGNOSIS — Z0000 Encounter for general adult medical examination without abnormal findings: Secondary | ICD-10-CM | POA: Diagnosis not present

## 2023-06-17 DIAGNOSIS — G301 Alzheimer's disease with late onset: Secondary | ICD-10-CM | POA: Diagnosis not present

## 2023-06-17 DIAGNOSIS — I10 Essential (primary) hypertension: Secondary | ICD-10-CM | POA: Diagnosis not present

## 2023-06-17 DIAGNOSIS — F0280 Dementia in other diseases classified elsewhere without behavioral disturbance: Secondary | ICD-10-CM | POA: Diagnosis not present

## 2023-06-17 DIAGNOSIS — E039 Hypothyroidism, unspecified: Secondary | ICD-10-CM | POA: Diagnosis not present

## 2023-06-18 DIAGNOSIS — F0390 Unspecified dementia without behavioral disturbance: Secondary | ICD-10-CM | POA: Diagnosis not present

## 2023-06-18 DIAGNOSIS — E039 Hypothyroidism, unspecified: Secondary | ICD-10-CM | POA: Diagnosis not present

## 2023-06-24 DIAGNOSIS — F0280 Dementia in other diseases classified elsewhere without behavioral disturbance: Secondary | ICD-10-CM | POA: Diagnosis not present

## 2023-06-24 DIAGNOSIS — I10 Essential (primary) hypertension: Secondary | ICD-10-CM | POA: Diagnosis not present

## 2023-06-24 DIAGNOSIS — G301 Alzheimer's disease with late onset: Secondary | ICD-10-CM | POA: Diagnosis not present

## 2023-06-24 DIAGNOSIS — J9811 Atelectasis: Secondary | ICD-10-CM | POA: Diagnosis not present

## 2023-06-24 DIAGNOSIS — R051 Acute cough: Secondary | ICD-10-CM | POA: Diagnosis not present

## 2023-07-01 DIAGNOSIS — I10 Essential (primary) hypertension: Secondary | ICD-10-CM | POA: Diagnosis not present

## 2023-07-01 DIAGNOSIS — G301 Alzheimer's disease with late onset: Secondary | ICD-10-CM | POA: Diagnosis not present

## 2023-07-01 DIAGNOSIS — R54 Age-related physical debility: Secondary | ICD-10-CM | POA: Diagnosis not present

## 2023-07-01 DIAGNOSIS — F0280 Dementia in other diseases classified elsewhere without behavioral disturbance: Secondary | ICD-10-CM | POA: Diagnosis not present

## 2023-07-11 DIAGNOSIS — G301 Alzheimer's disease with late onset: Secondary | ICD-10-CM | POA: Diagnosis not present

## 2023-07-11 DIAGNOSIS — F0280 Dementia in other diseases classified elsewhere without behavioral disturbance: Secondary | ICD-10-CM | POA: Diagnosis not present

## 2023-07-11 DIAGNOSIS — F411 Generalized anxiety disorder: Secondary | ICD-10-CM | POA: Diagnosis not present

## 2023-07-15 DIAGNOSIS — F0280 Dementia in other diseases classified elsewhere without behavioral disturbance: Secondary | ICD-10-CM | POA: Diagnosis not present

## 2023-07-15 DIAGNOSIS — I10 Essential (primary) hypertension: Secondary | ICD-10-CM | POA: Diagnosis not present

## 2023-07-15 DIAGNOSIS — G301 Alzheimer's disease with late onset: Secondary | ICD-10-CM | POA: Diagnosis not present

## 2023-07-15 DIAGNOSIS — N401 Enlarged prostate with lower urinary tract symptoms: Secondary | ICD-10-CM | POA: Diagnosis not present

## 2023-07-17 DIAGNOSIS — F02818 Dementia in other diseases classified elsewhere, unspecified severity, with other behavioral disturbance: Secondary | ICD-10-CM | POA: Diagnosis not present

## 2023-07-17 DIAGNOSIS — R911 Solitary pulmonary nodule: Secondary | ICD-10-CM | POA: Diagnosis not present

## 2023-07-17 DIAGNOSIS — G309 Alzheimer's disease, unspecified: Secondary | ICD-10-CM | POA: Diagnosis not present

## 2023-07-30 DIAGNOSIS — G309 Alzheimer's disease, unspecified: Secondary | ICD-10-CM | POA: Diagnosis not present

## 2023-07-30 DIAGNOSIS — F0390 Unspecified dementia without behavioral disturbance: Secondary | ICD-10-CM | POA: Diagnosis not present

## 2023-08-02 DIAGNOSIS — C44329 Squamous cell carcinoma of skin of other parts of face: Secondary | ICD-10-CM | POA: Diagnosis not present

## 2023-08-02 DIAGNOSIS — Z85828 Personal history of other malignant neoplasm of skin: Secondary | ICD-10-CM | POA: Diagnosis not present

## 2023-08-14 DIAGNOSIS — F02811 Dementia in other diseases classified elsewhere, unspecified severity, with agitation: Secondary | ICD-10-CM | POA: Diagnosis not present

## 2023-08-14 DIAGNOSIS — I10 Essential (primary) hypertension: Secondary | ICD-10-CM | POA: Diagnosis not present

## 2023-08-14 DIAGNOSIS — C44309 Unspecified malignant neoplasm of skin of other parts of face: Secondary | ICD-10-CM | POA: Diagnosis not present

## 2023-08-14 DIAGNOSIS — G301 Alzheimer's disease with late onset: Secondary | ICD-10-CM | POA: Diagnosis not present

## 2023-08-14 DIAGNOSIS — E038 Other specified hypothyroidism: Secondary | ICD-10-CM | POA: Diagnosis not present

## 2023-08-19 DIAGNOSIS — I129 Hypertensive chronic kidney disease with stage 1 through stage 4 chronic kidney disease, or unspecified chronic kidney disease: Secondary | ICD-10-CM | POA: Diagnosis not present

## 2023-08-19 DIAGNOSIS — N1831 Chronic kidney disease, stage 3a: Secondary | ICD-10-CM | POA: Diagnosis not present

## 2023-08-19 DIAGNOSIS — G301 Alzheimer's disease with late onset: Secondary | ICD-10-CM | POA: Diagnosis not present

## 2023-08-19 DIAGNOSIS — Z85828 Personal history of other malignant neoplasm of skin: Secondary | ICD-10-CM | POA: Diagnosis not present

## 2023-08-19 DIAGNOSIS — C44309 Unspecified malignant neoplasm of skin of other parts of face: Secondary | ICD-10-CM | POA: Diagnosis not present

## 2023-08-19 DIAGNOSIS — C44329 Squamous cell carcinoma of skin of other parts of face: Secondary | ICD-10-CM | POA: Diagnosis not present

## 2023-08-19 DIAGNOSIS — F02811 Dementia in other diseases classified elsewhere, unspecified severity, with agitation: Secondary | ICD-10-CM | POA: Diagnosis not present

## 2023-08-29 DIAGNOSIS — E039 Hypothyroidism, unspecified: Secondary | ICD-10-CM | POA: Diagnosis not present

## 2023-08-29 DIAGNOSIS — G309 Alzheimer's disease, unspecified: Secondary | ICD-10-CM | POA: Diagnosis not present

## 2023-09-05 DIAGNOSIS — B351 Tinea unguium: Secondary | ICD-10-CM | POA: Diagnosis not present

## 2023-09-05 DIAGNOSIS — M2012 Hallux valgus (acquired), left foot: Secondary | ICD-10-CM | POA: Diagnosis not present

## 2023-09-09 DIAGNOSIS — I129 Hypertensive chronic kidney disease with stage 1 through stage 4 chronic kidney disease, or unspecified chronic kidney disease: Secondary | ICD-10-CM | POA: Diagnosis not present

## 2023-09-09 DIAGNOSIS — F02811 Dementia in other diseases classified elsewhere, unspecified severity, with agitation: Secondary | ICD-10-CM | POA: Diagnosis not present

## 2023-09-09 DIAGNOSIS — R634 Abnormal weight loss: Secondary | ICD-10-CM | POA: Diagnosis not present

## 2023-09-09 DIAGNOSIS — G301 Alzheimer's disease with late onset: Secondary | ICD-10-CM | POA: Diagnosis not present

## 2023-09-09 DIAGNOSIS — N1831 Chronic kidney disease, stage 3a: Secondary | ICD-10-CM | POA: Diagnosis not present

## 2023-09-09 DIAGNOSIS — F411 Generalized anxiety disorder: Secondary | ICD-10-CM | POA: Diagnosis not present

## 2023-09-11 ENCOUNTER — Ambulatory Visit: Admitting: Emergency Medicine

## 2023-09-11 ENCOUNTER — Encounter: Payer: Self-pay | Admitting: Emergency Medicine

## 2023-09-11 VITALS — BP 100/64 | HR 73 | Temp 98.2°F | Ht 74.0 in | Wt 162.0 lb

## 2023-09-11 DIAGNOSIS — Z87891 Personal history of nicotine dependence: Secondary | ICD-10-CM | POA: Diagnosis not present

## 2023-09-11 DIAGNOSIS — R911 Solitary pulmonary nodule: Secondary | ICD-10-CM | POA: Diagnosis not present

## 2023-09-11 DIAGNOSIS — G309 Alzheimer's disease, unspecified: Secondary | ICD-10-CM | POA: Diagnosis not present

## 2023-09-11 DIAGNOSIS — Z8509 Personal history of malignant neoplasm of other digestive organs: Secondary | ICD-10-CM

## 2023-09-11 DIAGNOSIS — F02818 Dementia in other diseases classified elsewhere, unspecified severity, with other behavioral disturbance: Secondary | ICD-10-CM | POA: Diagnosis not present

## 2023-09-11 NOTE — Progress Notes (Signed)
 Subjective:    Patient ID: Dylan Moreno, male    DOB: 1940/12/02, 82 y.o.   MRN: [id number]  HPI Discussed the use of AI scribe software for clinical note transcription with the patient, who gave verbal consent to proceed.  History of Present Illness Dylan Moreno is an 83 year old male who presents for evaluation of a pulmonary nodule. He is accompanied by Marval Blush, his former business partner, and Prentice, her partner. He was referred by [facility] for evaluation of a pulmonary nodule found on a chest x-ray.  A pulmonary nodule was identified on a chest x-ray performed on June 14, 2023, showing an ill-defined nodular density in the right upper lobe. No symptoms such as coughing, mucus production, or chest discomfort. Breathing is described as excellent.  He has a history of squamous cell carcinoma of the tongue, treated with 35 doses of radiation therapy, completed around 2016. His last follow-up scan in May 2019 showed stable pulmonary nodules. Recently, a squamous cell tumor was removed from his face.  He has a history of former tobacco use and prior alcohol abuse. He smoked for many years but has since quit. He denies any significant exposure to fumes, dust, or chemicals in his past work as a Artist and in Catering manager.  He has a history of Alzheimer's and dementia, as well as a prior transient ischemic attack (TIA). He is currently living a sedentary lifestyle.    Results RADIOLOGY Chest X-ray: Normal mediastinum and cardiac contours, ill-defined nodular density in the right upper lobe (06/14/2023)    Review of Systems As per HPI  Past Medical History:  Diagnosis Date   Acute delirium 06/02/2014   ADHD (attention deficit hyperactivity disorder)    Alcohol abuse 06/08/2014   Anxiety    Arthritis    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   Cerebral ventriculomegaly 06/08/2014   Dementia (HCC)     Dysphagia 06/17/2014   Dysuria 07/15/2014   GERD (gastroesophageal reflux disease)    History of radiation therapy 04/06/14- 05/25/14   Base of tongue and bilateral neck 70 Gy in 35 fractions to gross disease, 63 Gy in 35 Fractions to high risk nodal echelons, and 56 Gy in 35 fractions to intermediate risk nodal echelons   Hypercholesterolemia    Hyperlipemia    Hypertension    Hyponatremia    Kidney stones    Leukopenia due to antineoplastic chemotherapy 04/28/2014   Major neurocognitive disorder (HCC) 01/22/2019   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   Mild cognitive impairment with memory loss 12/22/2014   Mild to moderate hearing loss 03/18/2014   Peyronie's disease    Subacute delirium 05/25/2014   Substance abuse (HCC)    TIA (transient ischemic attack) 12/29/12   left facial droop    Family History  Problem Relation Age of Onset   Heart attack Mother        Deceased, 65   Alcohol abuse Mother    Hypothyroidism Father        Deceased, 50   Melanoma Brother    Alcohol abuse Brother    Healthy Son    Cancer Paternal Uncle        ?lung cancer     Social History   Socioeconomic History   Marital status: Married    Spouse name: Therisa   Number of children: 1   Years of education: 12   Highest  education level: High school graduate  Occupational History   Occupation: retired  Tobacco Use   Smoking status: Former   Smokeless tobacco: Never  Advertising account planner   Vaping status: Unknown  Substance and Sexual Activity   Alcohol use: Not Currently    Comment: every night (2-3oz of scotch)   Drug use: Not Currently    Types: Marijuana    Comment: mostly quit 2011-smokes occ   Sexual activity: Not on file  Other Topics Concern   Not on file  Social History Narrative   ** Merged History Encounter **       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 He lives at retirement center Right handed  Son currently living with parents due to back surgery. Highest level  of education: 1.5 years of college   Social Drivers of Health   Financial Resource Strain: Not on file  Food Insecurity: No Food Insecurity (03/27/2022)   Hunger Vital Sign    Worried About Running Out of Food in the Last Year: Never true    Ran Out of Food in the Last Year: Never true  Transportation Needs: No Transportation Needs (03/27/2022)   PRAPARE - Administrator, Civil Service (Medical): No    Lack of Transportation (Non-Medical): No  Physical Activity: Not on file  Stress: Not on file  Social Connections: Not on file  Intimate Partner Violence: Not At Risk (03/27/2022)   Humiliation, Afraid, Rape, and Kick questionnaire    Fear of Current or Ex-Partner: No    Emotionally Abused: No    Physically Abused: No    Sexually Abused: No    No Known Allergies  Current Outpatient Medications on File Prior to Visit  Medication Sig Dispense Refill   acetaminophen  (TYLENOL ) 500 MG tablet Take 1,000 mg by mouth every 8 (eight) hours as needed (for pain).     acetaminophen  (TYLENOL ) 500 MG tablet Take 1,000 mg by mouth every 6 (six) hours as needed (pain).     atorvastatin  (LIPITOR) 40 MG tablet Take 40 mg by mouth daily.     atorvastatin  (LIPITOR) 40 MG tablet Take 40 mg by mouth daily.     clopidogrel  (PLAVIX ) 75 MG tablet Place 1 tablet (75 mg total) into feeding tube daily. (Patient taking differently: Take 75 mg by mouth daily.) 30 tablet 0   clopidogrel  (PLAVIX ) 75 MG tablet Take 75 mg by mouth daily.     donepezil  (ARICEPT ) 10 MG tablet TAKE 1 TABLET BY MOUTH EVERY DAY (Patient taking differently: Take 10 mg by mouth at bedtime.) 90 tablet 3   donepezil  (ARICEPT ) 10 MG tablet Take 10 mg by mouth at bedtime.     fluticasone (FLONASE) 50 MCG/ACT nasal spray Place 2 sprays into both nostrils daily as needed for rhinitis.     fluticasone (FLONASE) 50 MCG/ACT nasal spray Place 2 sprays into both nostrils daily as needed for allergies or rhinitis.     guaifenesin  (ROBITUSSIN) 100 MG/5ML syrup Take 200 mg by mouth 2 (two) times daily as needed for cough.     levothyroxine  (SYNTHROID ) 100 MCG tablet Take 100 mcg by mouth daily before breakfast.     levothyroxine  (SYNTHROID , LEVOTHROID) 112 MCG tablet Take 112 mcg by mouth daily before breakfast.  3   LORazepam  (ATIVAN ) 0.5 MG tablet Take 0.5 mg by mouth daily. May take two additional doses in 24hr as needed for anxiety/agitation     LORazepam  (ATIVAN ) 0.5 MG tablet Take 0.5 mg  by mouth 2 (two) times daily as needed for anxiety. To be given in addition to lorazepam  0.5mg  once daily if needed.     losartan (COZAAR) 25 MG tablet Take 25 mg by mouth daily.     losartan (COZAAR) 25 MG tablet Take 12.5 mg by mouth daily.     OVER THE COUNTER MEDICATION Take 400 mg by mouth See admin instructions. Geri-Tussin SUGAR-FREE/AF 100 mg/5 ml's: Take 400 mg by mouth twice a day as needed for cough and congestion     PRESCRIPTION MEDICATION Apply 1 mL topically 2 (two) times daily as needed (agitation, control). Lorazepam  1mg /mL PLO gel     QUEtiapine  (SEROQUEL ) 25 MG tablet Take 1 tablet (25 mg total) by mouth 2 (two) times daily. 60 tablet 0   tamsulosin  (FLOMAX ) 0.4 MG CAPS capsule Take 0.4 mg by mouth daily.  3   tamsulosin  (FLOMAX ) 0.4 MG CAPS capsule Take 0.4 mg by mouth daily.     thiamine  (VITAMIN B-1) 100 MG tablet Take 100 mg by mouth daily.     thiamine  (VITAMIN B1) 100 MG tablet Take 100 mg by mouth daily.     No current facility-administered medications on file prior to visit.       Objective:    Vitals:   09/11/23 1438  BP: 100/64  Pulse: 73  Temp: 98.2 F (36.8 C)  TempSrc: Oral  SpO2: 94%  Weight: 162 lb (73.5 kg)  Height: 6' 2 (1.88 m)   Physical Exam Gen: Pleasant, thin elderly man , in no distress,  normal affect  ENT: No lesions,  mouth clear,  oropharynx clear, no postnasal drip  Neck: No JVD, no stridor  Lungs: No use of accessory muscles, no crackles or wheezing on normal  respiration, no wheeze on forced expiration  Cardiovascular: RRR, heart sounds normal, no murmur or gallops, no peripheral edema  Musculoskeletal: No deformities, no cyanosis or clubbing  Neuro: alert, awake, non focal  Skin: Warm, no lesions or rash       Assessment & Plan:   Assessment & Plan   Assessment and Plan Assessment & Plan Pulmonary nodule, right upper lobe Identified ill-defined nodular density in right upper lobe on chest x-ray. Asymptomatic. Differential includes benign versus malignant, considering history of squamous cell carcinoma of the tongue. CT scan needed for risk stratification and comparison with 2019 scan. - Order CT scan of the chest without IV contrast at [facility]. - Review CT scan results and compare with the 2019 scan. - Discuss results via phone or MyChart if the CT scan is unchanged. - Schedule follow-up appointment to discuss results if necessary.  Malignant neoplasm of tongue, status post radiation Squamous cell carcinoma of the tongue treated with radiation therapy, completed in 2016. Disease-free with no signs of recurrence. Recent facial squamous cell carcinoma unrelated to previous tongue cancer or pulmonary nodule. - Monitor for any new symptoms or signs of recurrence.    No follow-ups on file.  Lamar Chris, MD, PhD 09/11/2023, 4:46 PM [facility] [phone] or if no answer before 7:00PM call [phone] For any issues after 7:00PM please call eLink [phone]

## 2023-09-11 NOTE — Patient Instructions (Signed)
  YOUR PLAN: -PULMONARY NODULE, RIGHT UPPER LOBE: A pulmonary nodule is a small, round growth in the lung. Given your history of squamous cell carcinoma of the tongue, we need to determine if this nodule is benign or malignant. We will order a CT scan of your chest without IV contrast at [facility] to compare with your 2019 scan. If the CT scan shows no changes, we will discuss the results via phone or MyChart. If necessary, we will schedule a follow-up appointment to discuss the results in person.  -MALIGNANT NEOPLASM OF TONGUE, STATUS POST RADIATION: You had squamous cell carcinoma of the tongue, which was treated with radiation therapy in 2016. You are currently disease-free with no signs of recurrence. The recent squamous cell carcinoma on your face is unrelated to your previous tongue cancer or the pulmonary nodule. We will continue to monitor for any new symptoms or signs of recurrence.  INSTRUCTIONS: Please schedule a CT scan of your chest without IV contrast at [facility]. We will review the results and compare them with your 2019 scan. If there are no changes, we will discuss the results via phone or MyChart. If necessary, we will schedule a follow-up appointment to discuss the results in person.

## 2023-09-12 DIAGNOSIS — G301 Alzheimer's disease with late onset: Secondary | ICD-10-CM | POA: Diagnosis not present

## 2023-09-12 DIAGNOSIS — F02811 Dementia in other diseases classified elsewhere, unspecified severity, with agitation: Secondary | ICD-10-CM | POA: Diagnosis not present

## 2023-09-12 DIAGNOSIS — F411 Generalized anxiety disorder: Secondary | ICD-10-CM | POA: Diagnosis not present

## 2023-09-16 DIAGNOSIS — G301 Alzheimer's disease with late onset: Secondary | ICD-10-CM | POA: Diagnosis not present

## 2023-09-16 DIAGNOSIS — N1831 Chronic kidney disease, stage 3a: Secondary | ICD-10-CM | POA: Diagnosis not present

## 2023-09-16 DIAGNOSIS — F02811 Dementia in other diseases classified elsewhere, unspecified severity, with agitation: Secondary | ICD-10-CM | POA: Diagnosis not present

## 2023-09-16 DIAGNOSIS — I129 Hypertensive chronic kidney disease with stage 1 through stage 4 chronic kidney disease, or unspecified chronic kidney disease: Secondary | ICD-10-CM | POA: Diagnosis not present

## 2023-09-16 DIAGNOSIS — C44320 Squamous cell carcinoma of skin of unspecified parts of face: Secondary | ICD-10-CM | POA: Diagnosis not present

## 2023-09-19 ENCOUNTER — Ambulatory Visit
Admission: RE | Admit: 2023-09-19 | Discharge: 2023-09-19 | Disposition: A | Discharge status: Home / Self Care | Source: Ambulatory Visit | Attending: Emergency Medicine | Admitting: Emergency Medicine

## 2023-09-19 DIAGNOSIS — R911 Solitary pulmonary nodule: Secondary | ICD-10-CM

## 2023-09-23 DIAGNOSIS — N1831 Chronic kidney disease, stage 3a: Secondary | ICD-10-CM | POA: Diagnosis not present

## 2023-09-23 DIAGNOSIS — I959 Hypotension, unspecified: Secondary | ICD-10-CM | POA: Diagnosis not present

## 2023-09-23 DIAGNOSIS — I129 Hypertensive chronic kidney disease with stage 1 through stage 4 chronic kidney disease, or unspecified chronic kidney disease: Secondary | ICD-10-CM | POA: Diagnosis not present

## 2023-09-23 DIAGNOSIS — G301 Alzheimer's disease with late onset: Secondary | ICD-10-CM | POA: Diagnosis not present

## 2023-09-23 DIAGNOSIS — F02811 Dementia in other diseases classified elsewhere, unspecified severity, with agitation: Secondary | ICD-10-CM | POA: Diagnosis not present

## 2023-09-25 DIAGNOSIS — G309 Alzheimer's disease, unspecified: Secondary | ICD-10-CM | POA: Diagnosis not present

## 2023-09-25 DIAGNOSIS — I10 Essential (primary) hypertension: Secondary | ICD-10-CM | POA: Diagnosis not present

## 2023-09-30 DIAGNOSIS — Z23 Encounter for immunization: Secondary | ICD-10-CM | POA: Diagnosis not present

## 2023-10-02 DIAGNOSIS — N1831 Chronic kidney disease, stage 3a: Secondary | ICD-10-CM | POA: Diagnosis not present

## 2023-10-03 DIAGNOSIS — G301 Alzheimer's disease with late onset: Secondary | ICD-10-CM | POA: Diagnosis not present

## 2023-10-03 DIAGNOSIS — F02811 Dementia in other diseases classified elsewhere, unspecified severity, with agitation: Secondary | ICD-10-CM | POA: Diagnosis not present

## 2023-10-03 DIAGNOSIS — F411 Generalized anxiety disorder: Secondary | ICD-10-CM | POA: Diagnosis not present

## 2023-10-07 DIAGNOSIS — R059 Cough, unspecified: Secondary | ICD-10-CM | POA: Diagnosis not present

## 2023-10-07 DIAGNOSIS — G301 Alzheimer's disease with late onset: Secondary | ICD-10-CM | POA: Diagnosis not present

## 2023-10-07 DIAGNOSIS — N1831 Chronic kidney disease, stage 3a: Secondary | ICD-10-CM | POA: Diagnosis not present

## 2023-10-07 DIAGNOSIS — I129 Hypertensive chronic kidney disease with stage 1 through stage 4 chronic kidney disease, or unspecified chronic kidney disease: Secondary | ICD-10-CM | POA: Diagnosis not present

## 2023-10-07 DIAGNOSIS — F02811 Dementia in other diseases classified elsewhere, unspecified severity, with agitation: Secondary | ICD-10-CM | POA: Diagnosis not present

## 2023-10-07 DIAGNOSIS — R051 Acute cough: Secondary | ICD-10-CM | POA: Diagnosis not present

## 2023-10-10 ENCOUNTER — Telehealth: Payer: Self-pay | Admitting: Emergency Medicine

## 2023-10-10 NOTE — Telephone Encounter (Signed)
 Copied from CRM [id number]. Topic: Medical Record Request - Provider/Facility Request >> Oct 10, 2023  1:42 PM Leila C wrote: Reason for CRM: Deland from [place] phone and fax# [phone] from Dr. Donnice Hunt's office needs patient CT scan results done in September 2025. Deland has a facility consent from and will be fax it over to the office. Please advise and call back.

## 2023-10-13 ENCOUNTER — Ambulatory Visit: Payer: Self-pay | Admitting: Emergency Medicine

## 2023-10-13 DIAGNOSIS — R911 Solitary pulmonary nodule: Secondary | ICD-10-CM

## 2023-10-14 DIAGNOSIS — I129 Hypertensive chronic kidney disease with stage 1 through stage 4 chronic kidney disease, or unspecified chronic kidney disease: Secondary | ICD-10-CM | POA: Diagnosis not present

## 2023-10-14 DIAGNOSIS — N1831 Chronic kidney disease, stage 3a: Secondary | ICD-10-CM | POA: Diagnosis not present

## 2023-10-14 DIAGNOSIS — Z8673 Personal history of transient ischemic attack (TIA), and cerebral infarction without residual deficits: Secondary | ICD-10-CM | POA: Diagnosis not present

## 2023-10-14 DIAGNOSIS — G301 Alzheimer's disease with late onset: Secondary | ICD-10-CM | POA: Diagnosis not present

## 2023-10-14 DIAGNOSIS — F02811 Dementia in other diseases classified elsewhere, unspecified severity, with agitation: Secondary | ICD-10-CM | POA: Diagnosis not present

## 2023-10-16 NOTE — Telephone Encounter (Signed)
 Noted and will address when the consent form they mentioned has been received.

## 2023-10-30 DIAGNOSIS — I10 Essential (primary) hypertension: Secondary | ICD-10-CM | POA: Diagnosis not present

## 2023-10-30 DIAGNOSIS — G309 Alzheimer's disease, unspecified: Secondary | ICD-10-CM | POA: Diagnosis not present

## 2023-10-31 ENCOUNTER — Ambulatory Visit: Admitting: Emergency Medicine

## 2023-10-31 DIAGNOSIS — G301 Alzheimer's disease with late onset: Secondary | ICD-10-CM | POA: Diagnosis not present

## 2023-10-31 DIAGNOSIS — F411 Generalized anxiety disorder: Secondary | ICD-10-CM | POA: Diagnosis not present

## 2023-10-31 DIAGNOSIS — F02C11 Dementia in other diseases classified elsewhere, severe, with agitation: Secondary | ICD-10-CM | POA: Diagnosis not present

## 2023-11-04 DIAGNOSIS — R630 Anorexia: Secondary | ICD-10-CM | POA: Diagnosis not present

## 2023-11-04 DIAGNOSIS — N1831 Chronic kidney disease, stage 3a: Secondary | ICD-10-CM | POA: Diagnosis not present

## 2023-11-04 DIAGNOSIS — G301 Alzheimer's disease with late onset: Secondary | ICD-10-CM | POA: Diagnosis not present

## 2023-11-04 DIAGNOSIS — I129 Hypertensive chronic kidney disease with stage 1 through stage 4 chronic kidney disease, or unspecified chronic kidney disease: Secondary | ICD-10-CM | POA: Diagnosis not present

## 2023-11-04 DIAGNOSIS — F02C11 Dementia in other diseases classified elsewhere, severe, with agitation: Secondary | ICD-10-CM | POA: Diagnosis not present

## 2023-11-07 NOTE — Progress Notes (Signed)
 ATCP x1 left fm to call back

## 2023-11-08 ENCOUNTER — Telehealth: Payer: Self-pay | Admitting: *Deleted

## 2023-11-08 NOTE — Addendum Note (Signed)
 Addended by: Lee-Ann Gal M on: 11/08/2023 01:55 PM   Modules accepted: Orders

## 2023-11-08 NOTE — Telephone Encounter (Signed)
 Copied from CRM [id number]. Topic: Clinical - Lab/Test Results >> Nov 07, 2023  3:49 PM Rozanna G wrote: Reason for CRM: Marval Blush calling you back from missed call from today. Thanks  Marval is NOT listed as someone that we can speak with on pt's DPR. I called to speak with the pt at the number provided and advised Debbie that unfortunately she is not on DPR so I will need to call the pt's son or spouse who are listed.  I see that the pt's son has sent mychart msg, so will respond to him with Dr Lanny recommendations via mychart. Nothing further needed.

## 2023-11-13 DIAGNOSIS — C44329 Squamous cell carcinoma of skin of other parts of face: Secondary | ICD-10-CM | POA: Diagnosis not present

## 2023-11-13 DIAGNOSIS — I10 Essential (primary) hypertension: Secondary | ICD-10-CM | POA: Diagnosis not present

## 2023-11-13 DIAGNOSIS — R911 Solitary pulmonary nodule: Secondary | ICD-10-CM | POA: Diagnosis not present

## 2023-11-13 DIAGNOSIS — R634 Abnormal weight loss: Secondary | ICD-10-CM | POA: Diagnosis not present

## 2023-11-19 DIAGNOSIS — G309 Alzheimer's disease, unspecified: Secondary | ICD-10-CM | POA: Diagnosis not present

## 2023-11-19 DIAGNOSIS — F02C11 Dementia in other diseases classified elsewhere, severe, with agitation: Secondary | ICD-10-CM | POA: Diagnosis not present

## 2023-11-19 DIAGNOSIS — F411 Generalized anxiety disorder: Secondary | ICD-10-CM | POA: Diagnosis not present

## 2023-11-20 NOTE — Progress Notes (Signed)
 Called the provided number and spoke with the pt's wife. Pt's wife is not on the DPR, so I informed her that I was unable to speak with her. The wife expressed her frustration and states she had signed forms to allow us  to be able to speak with her at pt's visit on 09/11/23.  Front, please advise if this patient has an updated DPR anywhere that allows us  to speak with his wife.

## 2023-11-25 NOTE — Progress Notes (Signed)
 The pt's wife's name is Barnie Blush, the name on the DPR signed in 2020 doesn't have the same name. This pt is adamant that she signed a DPR in September of this year.

## 2023-12-02 DIAGNOSIS — G301 Alzheimer's disease with late onset: Secondary | ICD-10-CM | POA: Diagnosis not present

## 2023-12-02 DIAGNOSIS — N1831 Chronic kidney disease, stage 3a: Secondary | ICD-10-CM | POA: Diagnosis not present

## 2023-12-02 DIAGNOSIS — I129 Hypertensive chronic kidney disease with stage 1 through stage 4 chronic kidney disease, or unspecified chronic kidney disease: Secondary | ICD-10-CM | POA: Diagnosis not present

## 2023-12-02 DIAGNOSIS — F02811 Dementia in other diseases classified elsewhere, unspecified severity, with agitation: Secondary | ICD-10-CM | POA: Diagnosis not present

## 2023-12-02 DIAGNOSIS — E038 Other specified hypothyroidism: Secondary | ICD-10-CM | POA: Diagnosis not present

## 2023-12-03 DIAGNOSIS — G301 Alzheimer's disease with late onset: Secondary | ICD-10-CM | POA: Diagnosis not present

## 2023-12-03 DIAGNOSIS — F02811 Dementia in other diseases classified elsewhere, unspecified severity, with agitation: Secondary | ICD-10-CM | POA: Diagnosis not present

## 2023-12-03 DIAGNOSIS — F411 Generalized anxiety disorder: Secondary | ICD-10-CM | POA: Diagnosis not present

## 2024-03-12 ENCOUNTER — Other Ambulatory Visit
